# Patient Record
Sex: Male | Born: 1980 | Race: White | Hispanic: No | Marital: Single | Smoking: Never smoker
Health system: Southern US, Community
[De-identification: ages and names within clinical notes are randomized; demographics above are authoritative.]

## PROBLEM LIST (undated history)

## (undated) DIAGNOSIS — R748 Abnormal levels of other serum enzymes: Secondary | ICD-10-CM

## (undated) DIAGNOSIS — B009 Herpesviral infection, unspecified: Secondary | ICD-10-CM

## (undated) DIAGNOSIS — F1010 Alcohol abuse, uncomplicated: Secondary | ICD-10-CM

## (undated) HISTORY — DX: Herpesviral infection, unspecified: B00.9

## (undated) HISTORY — PX: WISDOM TOOTH EXTRACTION: SHX21

## (undated) HISTORY — PX: OTHER SURGICAL HISTORY: SHX169

---

## 2007-10-29 ENCOUNTER — Inpatient Hospital Stay (HOSPITAL_COMMUNITY): Admission: EM | Admit: 2007-10-29 | Discharge: 2007-10-31 | Payer: Self-pay | Admitting: Emergency Medicine

## 2007-10-29 ENCOUNTER — Ambulatory Visit: Payer: Self-pay | Admitting: Cardiology

## 2007-10-31 ENCOUNTER — Ambulatory Visit: Payer: Self-pay | Admitting: Cardiology

## 2007-11-05 ENCOUNTER — Ambulatory Visit: Payer: Self-pay | Admitting: Cardiology

## 2011-03-11 NOTE — Assessment & Plan Note (Signed)
[facility]                            CARDIOLOGY OFFICE NOTE   AMAURY, KUZEL                    MRN:          [id number]  DATE:11/05/2007                            DOB:          [birth year]    PRIMARY CARE PHYSICIAN:  Molly Maduro P. Merla Riches, M.D.   REASON FOR THIS:  Post hospitalization follow up.   HISTORY OF PRESENT ILLNESS:  I saw Mr. Trevor [name] as an inpatient  cardiology consultation earlier this month.  He was admitted to the  hospitalist service at that time with chest pain following a cocaine and  alcohol binge.  Mr. Tooker, typically, has not used any drugs; but  states that back in college he used to use cocaine, and had gotten  around some friends that were visiting through town, resulting in the  recurrent event.   He ruled out for myocardial infarction during observation, and also had  a CT scan of his chest that showed no evidence of aortic dissection.  He  underwent an exercise Myoview which was reassuring from both an  electrocardiographic perspective, and also a perfusion perspective with  no evidence of scar or ischemia, and a normal ejection fraction of 55%.  I spoke with him about cessation of cocaine use, and we also talked  about tobacco cessation.  He reports that he had no problems since  discharge, and his electrocardiogram today shows sinus rhythm with  increased voltage, and some changes consistent with early  repolarization.   ALLERGIES:  No known drug allergies.   CHRONIC MEDICATIONS:  None.   REVIEW OF SYSTEMS:  As described in history of present illness.  Otherwise negative.   PHYSICAL EXAMINATION:  Examination blood pressure today was 143/72.  Heart rate initially 102, down to the 80s.  Weight is 194 pounds.  The patient is comfortable and in no acute distress.  NECK:  Shows no elevated venous pressure without bruits.  LUNGS:  Clear.  CARDIAC EXAM:  Reveals a regular rate and rhythm.  No rub or gallop.  EXTREMITIES:  Show no pitting edema.   IMPRESSION/RECOMMENDATIONS:  1. History of chest pain following cocaine and alcohol ingestion.  The      patient ruled out for myocardial infarction, and had a CT scan of      the chest demonstrating no evidence of aortic dissection with a      follow up exercise Myoview showing no evidence of scar or ischemia      with an ejection fraction of 55%.  The essential point made, today,      was to abstain from any further use of cocaine; and for that matter      work on tobacco cessation as well.  At this point, he needs general      risk factor modification strategies.  I have recommended that he      continue      to follow up with Dr. Merla Riches in this regard.  2. Cardiology follow up can be as needed.     Jonelle Sidle, MD  Electronically Signed    SGM/MedQ  DD: 11/05/2007  DT: 11/05/2007  Job #: [id number]   cc:   Harrel Lemon. Merla Riches, M.D.

## 2011-03-11 NOTE — H&P (Signed)
NAME:  Trevor [name], OTTAWAY NO.:  [id number]   MEDICAL RECORD NO.:  [id number]          PATIENT TYPE:  INP   LOCATION:  0111                         FACILITY:  [facility]   PHYSICIAN:  Beckey Rutter, MD  DATE OF BIRTH:  [birth year]   DATE OF ADMISSION:  10/29/2007  DATE OF DISCHARGE:                              HISTORY & PHYSICAL   PRIMARY CARE PHYSICIAN:  [facility]/Cardiology.  This patient  is unassigned to [facility].   CHIEF COMPLAINT:  Chest pain.   HISTORY OF PRESENT ILLNESS:  This is a 30 year old male with history of  substance abuse who presented today with chest pain.  The pain started  this morning, 10 out of 10, epigastric to the left of his chest in  position.  The pain is associated with shortness of breath but not  associated with significant relieving factor.  The patient was doing  cocaine yesterday and he was also drinking.  The duration is less than 1  hour.  No significant radiation.   PAST MEDICAL HISTORY:  Significant for polysubstance abuse, ethanol  abuse and tobacco abuse.   FAMILY HISTORY:  Noncontributory.   SOCIAL HISTORY:  Cocaine abuse, smoker and occasional drinker.   MEDICATION ALLERGIES:  Not known to have medication allergy.   MEDICATIONS:  None.   REVIEW OF SYSTEMS:  The patient denied headache, fevers, chills.  The  rest of review of systems is unremarkable.   PHYSICAL EXAMINATION:  VITAL SIGNS:  Temperature is 97.5, last blood  pressure is 133/65, first blood pressure at 7:00 in the morning here in  the emergency room is 159/69.  Pulse now is 106.  When he presented at  7:00 in the morning was documented at 145.  HEENT:  Head atraumatic, normocephalic.  PERRL.  Mouth moist.  No ulcer.  NECK:  Supple.  No JVD.  HEART:  Precordium first and second heart sounds audible.  No added  sounds.  ABDOMEN:  Soft, nontender.  Bowel sounds present.  NEUROLOGICAL:  He is alert, oriented x3 moving all his extremities  spontaneously.  GU:  No CVA tenderness.  EXTREMITIES:  No lower extremities tenderness.   LABS AND X-RAY:  Lipid profile showing HDL 49, LDL 87, total cholesterol  158, total bilirubin is 0.5, alkaline phosphatase is 46, AST 24, ALT 24,  CK-MB is 1.6, troponin is less than 0.05, myoglobin is 76.0.  Drug  screen showing positive cocaine, sodium 141, potassium 3.6, chloride  106, bicarb 25, glucose 107, BUN 8, creatinine 0.89.  Alcohol level is  195.  Second cardiac enzymes in the emergency room here, troponin is  less than 0.05, CK-MB is 2.2, INR is 1.1, white blood count is 8.7,  hemoglobin is 15.8, hematocrit is 44.8, platelet count is 290.  EKG  showing normal sinus rhythm at ventricular rate 121.  No old EKG in the  [facility] to compare to.  Chest x-ray showing impression of no active  disease.  The heart size and the mediastinal contours are normal.  Both  lungs are clear.   ASSESSMENT AND PLAN:  1. This is a  30 year old with polysubstance abuse who presented today      with chest pain.  This chest pain could be secondary to the effect      of the cocaine and for that we will admit the patient to rule out      acute coronary syndromes.  The patient was seen in consultation by      [facility] and they will follow up through with [place] for      further risk stratification.  The patient will be admitted to      stepdown floor for further assessment and management.  2. We will continue the patient on aspirin enteric coated.  I will      hold on giving beta-blocker at this time because of the history of      the cocaine.  3. The patient will be ruled out by serial cardiac enzymes and serial      EKGs.  4. The patient was counseled in regard to his smoking habits as well      as his cocaine use.  Further smoking cessation and polysubstance      abuse will be conducted during this hospitalization.  5. Cardiology consultation was taking place and further risk      stratification might  be needed as per cardiology discussion.  6. For deep venous thrombosis prophylaxis I will start the patient on      Lovenox.  7. For GI prophylaxis I will consider Protonix.      Beckey Rutter, MD  Electronically Signed     EME/MEDQ  D:  10/29/2007  T:  10/29/2007  Job:  [id number]

## 2011-03-11 NOTE — Consult Note (Signed)
NAME:  TOMER, CHALMERS NO.:  [id number]   MEDICAL RECORD NO.:  [id number]          PATIENT TYPE:  EMS   LOCATION:  ED                           FACILITY:  [facility]   PHYSICIAN:  Jonelle Sidle, MD DATE OF BIRTH:  [birth year]   DATE OF CONSULTATION:  DATE OF DISCHARGE:                                 CONSULTATION   REASON FOR CONSULTATION:  Chest pain associated with recent cocaine use.   HISTORY OF PRESENT ILLNESS:  Mr. Elizondo is a 30 year old male with no  reported major medical conditions and on no regular medications at home.  He reports a use of cocaine beginning yesterday at approximately 10 p.m.  and used intermittently until 4 a.m. this morning.  He states that he  used at least 2 g.  He also had a history of binge drinking of alcohol  and was drinking alcohol as well.  He came into the emergency department  this morning complaining of severe chest pain at approximately 6:30.  He  was treated with aspirin, morphine, Ativan, and intravenous  nitroglycerine and had improvement in his pain, although persistent  symptoms prompting a consultation with our service.  On my interview  now, at 10:30 in the morning the patient reports that he feels much  better without any active symptoms.  He continues on intravenous  nitroglycerine.  His electrocardiogram shows no acute injury pattern and  his initial cardiac markers are normal.  Chest x-ray reports no acute  disease process.  He was hypertensive upon presentation.  At the present  time, blood pressure is well controlled with a systolic of 130.   MEDICATIONS:  None chronically.  The patient is on intravenous  nitroglycerine presently at 20 mcg per minute, enteric coated aspirin 81  mg by mouth daily.  He has been treated with morphine and Ativan as  well.   PAST MEDICAL HISTORY:  Is as outlined above.  Patient denies any history  of type 2 diabetes mellitus, hypertension, hyperlipidemia or  cardiovascular  disease.  He did have a previous surgery to repair a  fractured left arm.   SOCIAL HISTORY:  The patient lives in [place].  He is single.  He  sells Designer, fashion/clothing.  He has a tobacco use up to pack per day  typically on Friday and Saturday.  Also history of binge drinking of  alcohol, spending approximately 200 to 300 dollars over the weekend.  He  reports using cocaine approximately 5 years ago,but not until recently.  He also states that he exercises by running approximately 15 miles a  week, typically without any symptoms of chest pain or breathlessness.   FAMILY HISTORY:  Reviewed.  No history of premature cardiovascular  disease evident.   REVIEW OF SYSTEMS:  As described in history of present illness.  He  reports problems with shin splints related to his running, otherwise  negative.   PHYSICAL EXAMINATION:  VITAL SIGNS:  Heart rate is in the 80s in sinus  rhythm.  Systolic blood pressure of 130.  Patient is afebrile.  GENERAL:  He is normally a well-nourished young  male, no acute distress  stating that he is feels much better.  HEENT:  Conjunctivae is normal.  Pharynx is clear.  NECK:  Supple.  No elevated jugular venous pressure.  No loud bruits.  No thyromegaly or thyroid tenderness.  LUNGS:  Clear without labored breathing.  CARDIAC:  Regular rate and rhythm.  No S3 gallop or obvious pericardial  rub.  ABDOMEN:  Soft.  Nontender.  Normoactive bowel sounds.  EXTREMITIES:  Exhibit no clubbing, cyanosis or edema.  Distal pulses are  2+.  SKIN:  Warm.  No lesions.  NEUROPSYCHIATRIC:  The patient is alert and oriented x 3.  Mildly  anxious.   LABORATORY DATA:  WBC is 8.7, hemoglobin 15.8, hematocrit 44.8,  platelets 290, INR 1.1, sodium 141, potassium 3.6, BUN 8, creatinine  0.89, troponin I less than 0.05 with normal myoglobin, urine drug screen  positive for cocaine, alcohol level 195.   IMPRESSION:  1. Chest pain syndrome following cocaine use.   Electrocardiogram at      this point is nonspecific and the patient reports improvement in      symptoms following treatment with benzodiazepines and      nitroglycerine.  His initial cardiac markers are normal.  Typically      he has no exertional chest pain and no other major cardiac risk      factors.  He was hypertensive on presentation although his systolic      blood pressure now is well controlled.  2. History of alcohol binge drinking and tobacco use.   RECOMMENDATIONS:  Suggest admission to the hospital for observation.  The hospitalist service will be evaluating the patient.  Would cycle  cardiac markers and continue aspirin, benzodiazepines and nitroglycerin.  I would also check a CT scan of the chest to rule out aortic dissection  given patient's presentation, although at this point I do not have a  high suspicion that this is the case.  DT prophylaxis is also indicated.  If his cardiac markers and CT scan are reassuring and he remains  clinically stable we can consider an exercise Myoview as an inpatient.  Our service with follow with you.      Jonelle Sidle, MD  Electronically Signed     SGM/MEDQ  D:  10/29/2007  T:  10/29/2007  Job:  [id number]

## 2011-03-11 NOTE — Discharge Summary (Signed)
NAME:  Trevor [name], Trevor [name] NO.:  [id number]   MEDICAL RECORD NO.:  [id number]          PATIENT TYPE:  INP   LOCATION:  1438                         FACILITY:  [facility]   PHYSICIAN:  Beckey Rutter, MD  DATE OF BIRTH:  October 11, 1981   DATE OF ADMISSION:  10/29/2007  DATE OF DISCHARGE:  10/31/2007                               DISCHARGE SUMMARY   PRIMARY CARE PHYSICIAN:  Unassigned.   CHIEF COMPLAINT:  Chest pain.   HISTORY OF PRESENT ILLNESS:  A 30 year old, came with chest pain after  cocaine abuse.   HOSPITAL COURSE:  Problem 1.  During hospitalization the patient was seen and evaluated by  cardiology service with [facility].  The patient was started on  nitro drip and then was admitted to the ICU during the chest pain  period.  The patient's pain is relieved the second day and then he had a  stress test for stratification, which showed no evidence of ischemia  although the ejection fraction mentioned about 55%.  The the patient  remained pain-free and with also negative monitoring on the telemetry.  There is no significant change in the EKG.  The patient was stable for  discharge today, to follow up with [facility] within a week.   Problem 2.  COCAINE ABUSE:  The patient was counseled.   Problem 3.  TOBACCO ABUSE:  The patient was counseled and smoking  cessation material was offered.   HOSPITAL CONSULTATION:  Cardiology, [place] [facility], Jonelle Sidle, MD.   PROCEDURES:  1. Myoview stress test on October 30, 2006.  Impression:  Normal left      ventricular function with quantitative ejection fraction of 55%.      No evidence of inducible ischemia with treadmill exercise.  2. CT chest with contrast done on October 29, 2007.  Impression:  No      acute findings.  3. Chest x-ray done on October 29, 2007.  Impression:  No acute      disease.   DISCHARGE DIAGNOSES:  1. Chest pain, likely cocaine-induced.  2. Cocaine abuse.  3. Tobacco  abuse.   DISCHARGE MEDICATIONS:  None.   DISCHARGE/PLAN:  The patient was discharged to follow up with [facility]  [facility]iology as discussed with him.  The patient is aware and agreeable to  the discharge plan.      Beckey Rutter, MD  Electronically Signed     EME/MEDQ  D:  11/01/2007  T:  11/01/2007  Job:  [id number]

## 2011-07-17 LAB — DIFFERENTIAL
Basophils Absolute: 0
Basophils Relative: 0
Eosinophils Absolute: 0.1
Eosinophils Relative: 1
Lymphocytes Relative: 21
Lymphs Abs: 1.8
Neutrophils Relative %: 58
Neutrophils Relative %: 73

## 2011-07-17 LAB — HEPATIC FUNCTION PANEL
ALT: 24
AST: 24
Indirect Bilirubin: 0.4
Total Protein: 6.5

## 2011-07-17 LAB — CARDIAC PANEL(CRET KIN+CKTOT+MB+TROPI)
CK, MB: 1.8
Relative Index: 1.1
Relative Index: 1.1
Troponin I: 0.01

## 2011-07-17 LAB — POCT CARDIAC MARKERS
Operator id: 3067
Troponin i, poc: <0.05

## 2011-07-17 LAB — BASIC METABOLIC PANEL
BUN: 12
CO2: 27
Calcium: 8.9
Chloride: 106
Chloride: 107
Creatinine, Ser: 0.84
Creatinine, Ser: 0.89
Glucose, Bld: 93

## 2011-07-17 LAB — ETHANOL

## 2011-07-17 LAB — RAPID URINE DRUG SCREEN, HOSP PERFORMED
Cocaine: POSITIVE — AB
Opiates: NOT DETECTED

## 2011-07-17 LAB — LIPID PANEL
HDL: 49
Triglycerides: 112

## 2011-07-17 LAB — CBC
MCHC: 35.1
MCV: 93.5
Platelets: 239
Platelets: 290
RDW: 12.9
WBC: 6.9
WBC: 8.7

## 2011-07-17 LAB — PROTIME-INR
INR: 1.1
Prothrombin Time: 14.1

## 2011-07-17 LAB — PHOSPHORUS: Phosphorus: 3.8

## 2011-07-17 LAB — MAGNESIUM: Magnesium: 2.2

## 2011-07-17 LAB — CK TOTAL AND CKMB (NOT AT ARMC)
Relative Index: 1
Total CK: 230

## 2011-07-17 LAB — TROPONIN I: Troponin I: 0.03

## 2012-08-06 ENCOUNTER — Ambulatory Visit (INDEPENDENT_AMBULATORY_CARE_PROVIDER_SITE_OTHER): Payer: BC Managed Care – PPO | Admitting: Physician Assistant

## 2012-08-06 VITALS — BP 128/76 | HR 71 | Temp 98.0°F | Resp 16 | Ht 68.5 in | Wt 190.0 lb

## 2012-08-06 DIAGNOSIS — B009 Herpesviral infection, unspecified: Secondary | ICD-10-CM | POA: Insufficient documentation

## 2012-08-06 MED ORDER — ACYCLOVIR 400 MG PO TABS: 400.0000 mg | ORAL_TABLET | Freq: Three times a day (TID) | ORAL | Status: DC

## 2012-08-06 NOTE — Progress Notes (Signed)
  Subjective:    Patient ID: Trevor [name], male    DOB: 1981/02/12, 31 y.o.   MRN: [id number]  HPI This 31 y.o. 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  He has no current symptoms, but has run out of acyclovir he uses for episodic treatment.  Review of Systems As above.     Past Medical History  Diagnosis Date  . HSV-2 (herpes simplex virus 2) infection     Left upper chest wall; diagnosed by culture    Past Surgical History  Procedure Date  . Broken arm     Prior to Admission medications   Not on File    No Known Allergies  History   Social History  . Marital Status: Single    Spouse Name: n/a    Number of Children: 0  . Years of Education: 14   Occupational History  . Concrete product manufacturing    Social History Main Topics  . Smoking status: Never Smoker   . Smokeless tobacco: Former Neurosurgeon  . Alcohol Use: Yes     occasional  . Drug Use: No  . Sexually Active: Not Currently   Other Topics Concern  . Not on file   Social History Narrative  . No narrative on file    Family History  Problem Relation Age of Onset  . Melanoma Mother        Objective:   Physical Exam  Blood pressure 128/76, pulse 71, temperature 98 F (36.7 C), temperature source Oral, resp. rate 16, height 5' 8.5" (1.74 m), weight 190 lb (86.183 kg), SpO2 97.00%. Body mass index is 28.47 kg/(m^2). Well-developed, well nourished WM who is awake, alert and oriented, in NAD. HEENT: [place]/AT, sclera and conjunctiva are clear.   Neck: supple, non-tender, no lymphadenopathy, thyromegaly. Heart: RRR, no murmur Lungs: normal effort, CTA Skin: warm and dry without rash, though scarring at the site on the left upper chest is consistent with previous outbreaks. Psychologic: good mood and appropriate affect, normal speech and behavior.     Assessment & Plan:   1. HSV-2 (herpes simplex virus 2) infection  acyclovir (ZOVIRAX) 400 MG tablet

## 2013-03-31 ENCOUNTER — Telehealth: Payer: Self-pay

## 2013-03-31 DIAGNOSIS — B009 Herpesviral infection, unspecified: Secondary | ICD-10-CM

## 2013-03-31 MED ORDER — ACYCLOVIR 400 MG PO TABS: 400.0000 mg | ORAL_TABLET | Freq: Three times a day (TID) | ORAL | Status: DC

## 2013-03-31 NOTE — Telephone Encounter (Signed)
PATIENT STATES HE IS GOING OUT OF TOWN ON Friday AND NEEDS A REFILL ON THE MEDICINE THAT CHELLE PRESCRIBES FOR THE OUTBREAK ON HIS CHEST. HE DOES NOT HAVE ANY REFILLS LEFT. BEST PHONE [phone] (CELL)   PHARMACY CHOICE IS [facility] ON [address].    MBC

## 2013-03-31 NOTE — Telephone Encounter (Signed)
Pended please advise.  

## 2013-03-31 NOTE — Telephone Encounter (Signed)
Patient advised.

## 2013-03-31 NOTE — Telephone Encounter (Signed)
Done

## 2013-07-27 ENCOUNTER — Telehealth: Payer: Self-pay

## 2013-07-27 DIAGNOSIS — B009 Herpesviral infection, unspecified: Secondary | ICD-10-CM

## 2013-07-27 MED ORDER — ACYCLOVIR 400 MG PO TABS: 400.0000 mg | ORAL_TABLET | Freq: Three times a day (TID) | ORAL | Status: DC

## 2013-07-27 NOTE — Telephone Encounter (Signed)
It has been one year since last office visit, pended small supply until he can return to clinic,

## 2013-07-27 NOTE — Telephone Encounter (Signed)
Sent in

## 2013-07-27 NOTE — Telephone Encounter (Signed)
Pt needs a refill on his acyclvir. Uses [facility] on corner of [address]. Call at [phone].

## 2014-03-15 ENCOUNTER — Ambulatory Visit (INDEPENDENT_AMBULATORY_CARE_PROVIDER_SITE_OTHER): Payer: BC Managed Care – PPO | Admitting: Physician Assistant

## 2014-03-15 VITALS — BP 131/73 | HR 58 | Temp 98.1°F | Resp 16 | Ht 66.0 in | Wt 189.2 lb

## 2014-03-15 DIAGNOSIS — B009 Herpesviral infection, unspecified: Secondary | ICD-10-CM

## 2014-03-15 MED ORDER — ACYCLOVIR 400 MG PO TABS: 400.0000 mg | ORAL_TABLET | Freq: Three times a day (TID) | ORAL | Status: DC

## 2014-03-15 NOTE — Progress Notes (Signed)
   Subjective:    Patient ID: Trevor [name], male    DOB: [birth year], 33 y.o.   MRN: [id number]  HPI 33 year old male presents for refill on acyclovir that he takes prn HSV 2 outbreaks. Has hx of this on his chest (diagnosed by culture in 2011).  Has been tolerating medication without any issues or concerns.  He starts the medication when he feels like his chest is "itchy and tingling" and can sometimes suppress an outbreak. Has had 2-3 outbreaks since last OV in 2013.  He is asymptomatic today.  He is otherwise healthy with no other concerns today.      Review of Systems  Constitutional: Negative for fever and chills.  Skin: Negative for rash.  Neurological: Negative for numbness.       Objective:   Physical Exam  Constitutional: He is oriented to person, place, and time. He appears well-developed and well-nourished.  HENT:  Head: Normocephalic and atraumatic.  Right Ear: External ear normal.  Left Ear: External ear normal.  Eyes: Conjunctivae are normal.  Neck: Normal range of motion.  Cardiovascular: Normal rate.   Pulmonary/Chest: Effort normal.  Neurological: He is alert and oriented to person, place, and time.  Psychiatric: He has a normal mood and affect. His behavior is normal. Judgment and thought content normal.          Assessment & Plan:  HSV-2 (herpes simplex virus 2) infection - Plan: acyclovir (ZOVIRAX) 400 MG tablet  Continue acyclovir 400 mg tid x 5 days prn outbreak.  Patient tolerating current dose. No other concerns today. Follow up as needed.

## 2014-07-25 ENCOUNTER — Telehealth: Payer: Self-pay

## 2014-07-25 DIAGNOSIS — B009 Herpesviral infection, unspecified: Secondary | ICD-10-CM

## 2014-07-25 MED ORDER — ACYCLOVIR 400 MG PO TABS: 400.0000 mg | ORAL_TABLET | Freq: Three times a day (TID) | ORAL | Status: DC

## 2014-07-25 NOTE — Telephone Encounter (Signed)
Pt would like to have a refill on his Zovirax 400mg s, Please call [phone]      [facility] [address]

## 2014-08-18 ENCOUNTER — Ambulatory Visit (INDEPENDENT_AMBULATORY_CARE_PROVIDER_SITE_OTHER): Payer: BC Managed Care – PPO | Admitting: Family Medicine

## 2014-08-18 VITALS — BP 116/68 | HR 66 | Temp 98.2°F | Resp 17 | Ht 68.5 in | Wt 203.0 lb

## 2014-08-18 DIAGNOSIS — S338XXA Sprain of other parts of lumbar spine and pelvis, initial encounter: Secondary | ICD-10-CM

## 2014-08-18 NOTE — Progress Notes (Signed)
   Subjective:    Patient ID: Trevor [name], male    DOB: Nov 15, 1980, 33 y.o.   MRN: [id number]  HPI Patient was having a bowel movement last night when he felt a pop in his right groin. The pain is intermittent, felt sharp at first, now feels more like pressure, no bulging or swelling noticed. Pain 4-5/10. More of an "annoyance," Has been on his feet a lot this morning and has noticed pain. Put ice on it last night and took tylenol with good relief. Slept fine last night without pain.  He does not typically have problems with constipation, but has been travelling for work this week, staying in hotels and eating poorly.    Review of Systems No abdominal pain, no difficulty with urination, no testicular pain, no radiation into back or down leg. No numbness or tingling.     Objective:   Physical Exam  Vitals reviewed. Constitutional: He is oriented to person, place, and time. He appears well-developed and well-nourished.  HENT:  Head: Normocephalic and atraumatic.  Eyes: Conjunctivae are normal.  Neck: Normal range of motion. Neck supple.  Cardiovascular: Normal rate.   Pulmonary/Chest: Effort normal.  Abdominal: Hernia confirmed negative in the right inguinal area.  Genitourinary:     Musculoskeletal: Normal range of motion.  Lymphadenopathy:       Right: No inguinal adenopathy present.  Neurological: He is alert and oriented to person, place, and time.  Skin: Skin is warm and dry.  Psychiatric: He has a normal mood and affect. His behavior is normal. Judgment and thought content normal.      Assessment & Plan:  1. Sprain of groin, initial encounter -OTC analgesics, ice as needed -RTC if worsening pain or if swelling occurs   Emi Belfasteborah B. Ameenah Prosser, FNP-BC  [facility], [facility]  08/18/2014 11:05 AM

## 2014-11-03 ENCOUNTER — Ambulatory Visit: Payer: BC Managed Care – PPO | Admitting: Family Medicine

## 2014-11-07 ENCOUNTER — Encounter: Payer: Self-pay | Admitting: Family Medicine

## 2014-11-07 ENCOUNTER — Ambulatory Visit (INDEPENDENT_AMBULATORY_CARE_PROVIDER_SITE_OTHER): Payer: BLUE CROSS/BLUE SHIELD | Admitting: Family Medicine

## 2014-11-07 VITALS — BP 134/91 | HR 69 | Temp 98.4°F | Resp 16 | Ht 69.0 in | Wt 210.0 lb

## 2014-11-07 DIAGNOSIS — Z131 Encounter for screening for diabetes mellitus: Secondary | ICD-10-CM

## 2014-11-07 DIAGNOSIS — Z0000 Encounter for general adult medical examination without abnormal findings: Secondary | ICD-10-CM

## 2014-11-07 DIAGNOSIS — Z1389 Encounter for screening for other disorder: Secondary | ICD-10-CM

## 2014-11-07 DIAGNOSIS — Z13220 Encounter for screening for lipoid disorders: Secondary | ICD-10-CM

## 2014-11-07 DIAGNOSIS — B009 Herpesviral infection, unspecified: Secondary | ICD-10-CM

## 2014-11-07 DIAGNOSIS — Z23 Encounter for immunization: Secondary | ICD-10-CM

## 2014-11-07 DIAGNOSIS — Z130 Encounter for screening for diseases of the blood and blood-forming organs and certain disorders involving the immune mechanism: Secondary | ICD-10-CM

## 2014-11-07 LAB — CBC
HEMATOCRIT: 47.5 % (ref 39.0–52.0)
HEMOGLOBIN: 16.7 g/dL (ref 13.0–17.0)
MCH: 32.4 pg (ref 26.0–34.0)
MCHC: 35.2 g/dL (ref 30.0–36.0)
MCV: 92.2 fL (ref 78.0–100.0)
MPV: 9.6 fL (ref 8.6–12.4)
Platelets: 294 10*3/uL (ref 150–400)
RBC: 5.15 MIL/uL (ref 4.22–5.81)
RDW: 13.4 % (ref 11.5–15.5)
WBC: 4.6 10*3/uL (ref 4.0–10.5)

## 2014-11-07 LAB — LIPID PANEL
CHOLESTEROL: 209 mg/dL — AB (ref 0–200)
HDL: 54 mg/dL (ref >39)
LDL CALC: 132 mg/dL — AB (ref 0–99)
TRIGLYCERIDES: 114 mg/dL (ref <150)
Total CHOL/HDL Ratio: 3.9 Ratio
VLDL: 23 mg/dL (ref 0–40)

## 2014-11-07 LAB — COMPREHENSIVE METABOLIC PANEL
ALK PHOS: 45 U/L (ref 39–117)
ALT: 35 U/L (ref 0–53)
AST: 26 U/L (ref 0–37)
Albumin: 4.7 g/dL (ref 3.5–5.2)
BILIRUBIN TOTAL: 0.8 mg/dL (ref 0.2–1.2)
BUN: 15 mg/dL (ref 6–23)
CO2: 27 mEq/L (ref 19–32)
CREATININE: 0.84 mg/dL (ref 0.50–1.35)
Calcium: 9.7 mg/dL (ref 8.4–10.5)
Chloride: 102 mEq/L (ref 96–112)
GLUCOSE: 96 mg/dL (ref 70–99)
POTASSIUM: 4.4 meq/L (ref 3.5–5.3)
SODIUM: 136 meq/L (ref 135–145)
TOTAL PROTEIN: 7.5 g/dL (ref 6.0–8.3)

## 2014-11-07 LAB — HEMOGLOBIN A1C
Hgb A1c MFr Bld: 5.4 % (ref <5.7)
MEAN PLASMA GLUCOSE: 108 mg/dL (ref <117)

## 2014-11-07 MED ORDER — ACYCLOVIR 400 MG PO TABS: ORAL_TABLET | ORAL | Status: DC

## 2014-11-07 NOTE — Patient Instructions (Signed)

## 2014-11-07 NOTE — Progress Notes (Signed)
Subjective:    Patient ID: Trevor [name], male    DOB: [birth year], 34 y.o.   MRN: [id number]  HPI Patient presents today for CPE. He needs Tdap and flu. He and his wife are expecting their first child at the end of the month. He has noticed some weight gain. He attributes this to eating differently during his wife's pregnancy and not exercising as much.   Has regular dental care. No recent HSV outbreaks (has documented on upper chest). Would like to have a prescription for acyclovir for the future.  Has occasional flare of psoriasis. Uses coal tar ointment with good relief.   Has had a cold since last week. Getting better. Occasional nonproductive cough. Clear nasal drainage. No headache, no sinus pressure. Some ear pressure last week. No sore throat. No fever/chills.   Past Medical History  Diagnosis Date  . 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 Broken arm     Family History  Problem Relation Age of Onset  . Melanoma Mother   . Cancer Mother   . Cancer Maternal Grandmother   . Cancer Maternal Grandfather    History  Substance Use Topics  . Smoking status: Never Smoker   . Smokeless tobacco: Former Neurosurgeon  . Alcohol Use: Yes     Comment: occasional/2-3 per week    Review of Systems  Constitutional: Negative.   HENT: Positive for rhinorrhea, sinus pressure and sneezing.   Eyes: Negative.   Respiratory: Positive for cough.   Cardiovascular: Negative.   Gastrointestinal: Negative.   Endocrine: Negative.   Genitourinary: Negative.   Musculoskeletal: Negative.   Skin: Negative.   Allergic/Immunologic: Negative.   Neurological: Negative.   Hematological: Negative.   Psychiatric/Behavioral: Negative.       Objective:   Physical Exam  Constitutional: He is oriented to person, place, and time. He appears well-developed and well-nourished.  HENT:  Head:  Normocephalic and atraumatic.  Right Ear: External ear normal.  Left Ear: External ear normal.  Nose: Rhinorrhea present.  Mouth/Throat: Oropharynx is clear and moist. No oropharyngeal exudate.  Eyes: Conjunctivae are normal. Pupils are equal, round, and reactive to light. Right eye exhibits no discharge. Left eye exhibits no discharge. No scleral icterus.  Neck: Normal range of motion. Neck supple.  Cardiovascular: Normal rate, regular rhythm, normal heart sounds and intact distal pulses.   Pulmonary/Chest: Effort normal and breath sounds normal.  Abdominal: Soft. Bowel sounds are normal. He exhibits no distension and no mass. There is no tenderness. There is no rebound and no guarding. Hernia confirmed negative in the right inguinal area.  Genitourinary: Testes normal and penis normal. Circumcised. No penile erythema or penile tenderness. No discharge found.  Musculoskeletal: Normal range of motion. He exhibits no edema or tenderness.  Lymphadenopathy:    He has no cervical adenopathy.       Right: No inguinal adenopathy present.       Left: No inguinal adenopathy present.  Neurological: He is alert and oriented to person, place, and time. He has normal reflexes.  Skin: Skin is warm and dry.  Psychiatric: He has a normal mood and affect. His behavior is normal. Judgment and thought content normal.  Vitals reviewed. BP 134/91 mmHg  Pulse 69  Temp(Src) 98.4 F (36.9 C) (Oral)  Resp 16  Ht  (1.753 m)  Wt 210 lb (95.255 kg)  BMI 31.00 kg/m2  SpO2 97%    Assessment & Plan:  1. Annual physical exam -discussed weight loss goals, encouraged healthy choices, increased exercise. Provided information regarding the Mediterranean Diet.   2. Need for prophylactic vaccination and inoculation against influenza - Flu Vaccine QUAD 36+ mos IM  3. Need for prophylactic vaccination with combined diphtheria-tetanus-pertussis (DTP) vaccine - Tdap vaccine greater than or equal to 7yo IM  4.  Screening for deficiency anemia - CBC  5. Screening for nephropathy - Comprehensive metabolic panel  6. Screening for cholesterol level - Lipid panel  7. Screening for diabetes mellitus - Hemoglobin A1c  8. HSV-2 (herpes simplex virus 2) infection - acyclovir (ZOVIRAX) 400 MG tablet; Take 1 tablet three times a day for 5 days  Dispense: 30 tablet; Refill: 2  Emi Belfasteborah B. Gessner, FNP-BC  [facility] [place] [facility]  11/07/2014 9:52 AM

## 2015-02-09 ENCOUNTER — Ambulatory Visit: Payer: BLUE CROSS/BLUE SHIELD | Admitting: Family Medicine

## 2015-02-13 ENCOUNTER — Ambulatory Visit (INDEPENDENT_AMBULATORY_CARE_PROVIDER_SITE_OTHER): Payer: BLUE CROSS/BLUE SHIELD | Admitting: Family Medicine

## 2015-02-13 ENCOUNTER — Encounter: Payer: Self-pay | Admitting: Family Medicine

## 2015-02-13 VITALS — BP 119/70 | HR 85 | Temp 98.3°F | Resp 16 | Ht 69.5 in | Wt 203.0 lb

## 2015-02-13 DIAGNOSIS — F411 Generalized anxiety disorder: Secondary | ICD-10-CM

## 2015-02-13 MED ORDER — ESCITALOPRAM OXALATE 20 MG PO TABS: 20.0000 mg | ORAL_TABLET | Freq: Every day | ORAL | Status: DC

## 2015-02-13 NOTE — Patient Instructions (Signed)
Take LEXAPRO 20 mg 1/2 tablet every morning for the rest of April. If you feel that you are still having some mild symptoms, then increase dose to 1 whole tablet on Feb 25, 2015. If you feel that you are managing your responsibilities and are able to focus and remain calm, then continue with 1/2 tablet daily.   Generalized Anxiety Disorder Generalized anxiety disorder (GAD) is a mental disorder. It interferes with life functions, including relationships, work, and school. GAD is different from normal anxiety, which everyone experiences at some point in their lives in response to specific life events and activities. Normal anxiety actually helps us prepare for and get through these life events and activities. Normal anxiety goes away after the event or activity is over.  GAD causes anxiety that is not necessarily related to specific events or activities. It also causes excess anxiety in proportion to specific events or activities. The anxiety associated with GAD is also difficult to control. GAD can vary from mild to severe. People with severe GAD can have intense waves of anxiety with physical symptoms (panic attacks).  SYMPTOMS The anxiety and worry associated with GAD are difficult to control. This anxiety and worry are related to many life events and activities and also occur more days than not for 6 months or longer. People with GAD also have three or more of the following symptoms (one or more in children):  Restlessness.   Fatigue.  Difficulty concentrating.   Irritability.  Muscle tension.  Difficulty sleeping or unsatisfying sleep. DIAGNOSIS GAD is diagnosed through an assessment by your health care provider. Your health care provider will ask you questions aboutyour mood,physical symptoms, and events in your life. Your health care provider may ask you about your medical history and use of alcohol or drugs, including prescription medicines. Your health care provider may also do a  physical exam and blood tests. Certain medical conditions and the use of certain substances can cause symptoms similar to those associated with GAD. Your health care provider may refer you to a mental health specialist for further evaluation. TREATMENT The following therapies are usually used to treat GAD:   Medication. Antidepressant medication usually is prescribed for long-term daily control. Antianxiety medicines may be added in severe cases, especially when panic attacks occur.   Talk therapy (psychotherapy). Certain types of talk therapy can be helpful in treating GAD by providing support, education, and guidance. A form of talk therapy called cognitive behavioral therapy can teach you healthy ways to think about and react to daily life events and activities.  Stress managementtechniques. These include yoga, meditation, and exercise and can be very helpful when they are practiced regularly. A mental health specialist can help determine which treatment is best for you. Some people see improvement with one therapy. However, other people require a combination of therapies. Document Released: 02/07/2013 Document Revised: 02/27/2014 Document Reviewed: 02/07/2013 [facility] Patient Information 2015 [employer] [place], [place]. This information is not intended to replace advice given to you by your health care provider. Make sure you discuss any questions you have with your health care provider.

## 2015-02-14 NOTE — Progress Notes (Signed)
S:  This 34 y.o male is here for treatment of anxiety. He recalls taking Lexapro in his late teens and when he was in college. That medication worked well for him but he has not needed it for several years. Now, he is a new father (infant daughter is 373 months old) and work is demanding. He feels very anxious at times; he reports difficulty concentrating and getting thoughts organized. He is not sleeping well (due to infant feedings and anxiety). Pt admits taking a partial dose of o wife's Xanax, which helped. He denies illicit drug use, excessive caffeine, diaphoresis, abnormal weight loss, CP or tightness, palpitations, SOB, HA, dizziness, weakness or syncope.  Family hx is significant for anxiety disorder in both parents.  Patient Active Problem List   Diagnosis Date Noted  . HSV-2 (herpes simplex virus 2) infection     Prior to Admission medications   Medication Sig Start Date End Date Taking? Authorizing Provider  acyclovir (ZOVIRAX) 400 MG tablet Take 1 tablet three times a day for 5 days 11/07/14   Emi Belfasteborah B Gessner, FNP    SURG, SOC and FAM HX reviewed.  ROS: A per HPI.  O: Filed Vitals:   02/13/15 1505  BP: 119/70  Pulse: 85  Temp: 98.3 F (36.8 C)  Resp: 16   GEN: In NAD; WN,WD. HENT: [place]/AT; EOMI w/ clear conj/sclerae. Ext ears/EACs/TMs normal. Oroph benign. NECK: Supple w/o LAN or TMG. COR: RRR. Normal S1 and S2. No m/g/r. LUNGS: CTA. Normal resp rate and effort. SKIN; W&D. No pallor. NEURO: A&O x 3; CNs intact. Nonfocal.  A/P: GAD (generalized anxiety disorder)- RX: Lexapro 20 mg 1/2 tab every morning for rest of April; if symptoms seem to be diminished, then continue this dose. If not feeling significantly improved, then increase to 1 tab= 20 mg every morning.  Meds ordered this encounter  Medications  . escitalopram (LEXAPRO) 20 MG tablet    Sig: Take 1 tablet (20 mg total) by mouth daily.    Dispense:  30 tablet    Refill:  5

## 2015-05-15 ENCOUNTER — Ambulatory Visit: Payer: BLUE CROSS/BLUE SHIELD | Admitting: Family Medicine

## 2015-05-24 ENCOUNTER — Other Ambulatory Visit: Payer: Self-pay | Admitting: Family Medicine

## 2015-08-25 ENCOUNTER — Ambulatory Visit (INDEPENDENT_AMBULATORY_CARE_PROVIDER_SITE_OTHER): Payer: BLUE CROSS/BLUE SHIELD | Admitting: Family Medicine

## 2015-08-25 VITALS — BP 128/80 | HR 74 | Temp 98.6°F | Resp 18 | Ht 69.25 in | Wt 202.0 lb

## 2015-08-25 DIAGNOSIS — R1011 Right upper quadrant pain: Secondary | ICD-10-CM

## 2015-08-25 DIAGNOSIS — R0789 Other chest pain: Secondary | ICD-10-CM

## 2015-08-25 DIAGNOSIS — K219 Gastro-esophageal reflux disease without esophagitis: Secondary | ICD-10-CM

## 2015-08-25 LAB — COMPREHENSIVE METABOLIC PANEL
ALBUMIN: 4.6 g/dL (ref 3.6–5.1)
ALT: 25 U/L (ref 9–46)
AST: 18 U/L (ref 10–40)
Alkaline Phosphatase: 43 U/L (ref 40–115)
BILIRUBIN TOTAL: 0.5 mg/dL (ref 0.2–1.2)
BUN: 16 mg/dL (ref 7–25)
CO2: 24 mmol/L (ref 20–31)
CREATININE: 0.8 mg/dL (ref 0.60–1.35)
Calcium: 10 mg/dL (ref 8.6–10.3)
Chloride: 104 mmol/L (ref 98–110)
Glucose, Bld: 102 mg/dL — ABNORMAL HIGH (ref 65–99)
Potassium: 4.4 mmol/L (ref 3.5–5.3)
SODIUM: 139 mmol/L (ref 135–146)
TOTAL PROTEIN: 7.3 g/dL (ref 6.1–8.1)

## 2015-08-25 LAB — POC MICROSCOPIC URINALYSIS (UMFC): Mucus: ABSENT

## 2015-08-25 LAB — POCT CBC
Granulocyte percent: 59.6 %G (ref 37–80)
HCT, POC: 45.4 % (ref 43.5–53.7)
HEMOGLOBIN: 15.9 g/dL (ref 14.1–18.1)
LYMPH, POC: 2.1 (ref 0.6–3.4)
MCH, POC: 32 pg — AB (ref 27–31.2)
MCHC: 35 g/dL (ref 31.8–35.4)
MCV: 91.6 fL (ref 80–97)
MID (cbc): 0.4 (ref 0–0.9)
MPV: 6.8 fL (ref 0–99.8)
POC GRANULOCYTE: 3.6 (ref 2–6.9)
POC LYMPH PERCENT: 33.7 %L (ref 10–50)
POC MID %: 6.7 %M (ref 0–12)
Platelet Count, POC: 260 10*3/uL (ref 142–424)
RBC: 4.96 M/uL (ref 4.69–6.13)
RDW, POC: 13.6 %
WBC: 6.1 10*3/uL (ref 4.6–10.2)

## 2015-08-25 LAB — POCT URINALYSIS DIP (MANUAL ENTRY)
BILIRUBIN UA: NEGATIVE
Bilirubin, UA: NEGATIVE
Glucose, UA: NEGATIVE
Leukocytes, UA: NEGATIVE
Nitrite, UA: NEGATIVE
Protein Ur, POC: NEGATIVE
RBC UA: NEGATIVE
SPEC GRAV UA: 1.02
Urobilinogen, UA: 0.2
pH, UA: 8

## 2015-08-25 LAB — POCT SEDIMENTATION RATE: POCT SED RATE: 7 mm/h (ref 0–22)

## 2015-08-25 LAB — GAMMA GT: GGT: 41 U/L (ref 7–51)

## 2015-08-25 MED ORDER — OMEPRAZOLE 40 MG PO CPDR: 40.0000 mg | DELAYED_RELEASE_CAPSULE | Freq: Every day | ORAL | Status: DC

## 2015-08-25 MED ORDER — MELOXICAM 15 MG PO TABS: 15.0000 mg | ORAL_TABLET | Freq: Every day | ORAL | Status: DC

## 2015-08-25 MED ORDER — ACYCLOVIR 400 MG PO TABS: ORAL_TABLET | ORAL | Status: DC

## 2015-08-25 NOTE — Progress Notes (Addendum)
Subjective:    Patient ID: Trevor [name], male    DOB: 29-Jul-1981, 34 y.o.   MRN: [id number] This chart was scribed for Norberto Sorenson, MD by Jolene Provost, Medical Scribe. This patient was seen in Room 4 and the patient's care was started a 9:40 AM.  Chief Complaint  Patient presents with  . Abdominal Pain    Upper right side, x1 week.   . Medication Refill    acyclovir      HPI HPI Comments: Trevor [name] is a 34 y.o. male who presents to [facility] complaining of constant dull upper right sided abdominal pain for the last week, with occasional twinges of pain when he turns his torso certain ways. He also states he has had worse heartburn and indigestion over that time and has been taking Tums intermittently throughout the day. He also endorses associated dry cough over the same period. He denies fever, chills, nausea, vomiting, constipation or diarrhea. He states he has been drinking more and his diet has been less healthy for the last couple weeks, and wants to make sure that does not have anything to do with his sx. He has also been running more for the last couple of weeks. His urine has been clear, and his stools have been normal without discoloration. He states he as been drinking more, and eating less healthily because he has been out of town on work, and he was bored.  Routine labs were done in January, which were normal other that slightly high cholesterol.  Past Medical History  Diagnosis Date  . HSV-2 (herpes simplex virus 2) infection     Left upper chest wall; diagnosed by culture   No Known Allergies  Current Outpatient Prescriptions on File Prior to Visit  Medication Sig Dispense Refill  . acyclovir (ZOVIRAX) 400 MG tablet TAKE 1 TABLET BY MOUTH THREE TIMES DAILY FOR 5 DAYS 30 tablet 4  . escitalopram (LEXAPRO) 20 MG tablet Take 1 tablet (20 mg total) by mouth daily. 30 tablet 5   No current facility-administered medications on file prior to visit.   Depression  screen [facility] 2/9 08/25/2015 11/07/2014  Decreased Interest 0 0  Down, Depressed, Hopeless 0 0  PHQ - 2 Score 0 0      Review of Systems  Constitutional: Negative for fever, chills, activity change, appetite change, fatigue and unexpected weight change.  Respiratory: Positive for cough. Negative for chest tightness, shortness of breath and wheezing.   Cardiovascular: Positive for chest pain. Negative for palpitations and leg swelling.  Gastrointestinal: Positive for abdominal pain. Negative for nausea, vomiting, diarrhea, constipation and abdominal distention.  Musculoskeletal: Negative for joint swelling and gait problem.  Skin: Negative for color change.  Neurological: Negative for syncope and weakness.  Hematological: Does not bruise/bleed easily.  Psychiatric/Behavioral: Negative for dysphoric mood.       Objective:  BP 128/80 mmHg  Pulse 74  Temp(Src) 98.6 F (37 C) (Oral)  Resp 18  Ht 5' 9.25" (1.759 m)  Wt 202 lb (91.627 kg)  BMI 29.61 kg/m2  SpO2 98%  Physical Exam  Constitutional: He is oriented to person, place, and time. He appears well-developed and well-nourished. No distress.  HENT:  Head: Normocephalic and atraumatic.  Nose: Nose normal.  Mouth/Throat: Oropharynx is clear and moist. No oropharyngeal exudate.  Eyes: Pupils are equal, round, and reactive to light.  Neck: Neck supple.  Cardiovascular: Normal rate, regular rhythm and normal heart sounds.   No murmur heard. Pulmonary/Chest: Effort  normal and breath sounds normal. No respiratory distress. He has no wheezes.  Excellent air movement.   Abdominal: Soft. Bowel sounds are normal. He exhibits no distension. There is no tenderness.  No CVA tenderness. No hepatosplenomegaly. Negative murphy's. No rib instability or TTP along interior rib cage.   Musculoskeletal: Normal range of motion.  Neurological: He is alert and oriented to person, place, and time. Coordination normal.  Skin: Skin is warm and dry. He  is not diaphoretic.  Psychiatric: He has a normal mood and affect. His behavior is normal.  Nursing note and vitals reviewed.     Assessment & Plan:   1. Right-sided chest wall pain   2. Abdominal pain, right upper quadrant   3. Gastroesophageal reflux disease, esophagitis presence not specified   Pt reassured that his pain seems to be chest wall so start qam meloxicam x 1-2 wks and ice. Uncontrolled GERD sxs so start ppi. Pt has stopped alcohol use.  Orders Placed This Encounter  Procedures  . Comprehensive metabolic panel  . Gamma GT  . H. pylori breath test  . POCT urinalysis dipstick  . POCT Microscopic Urinalysis ([facility])  . POCT CBC  . POCT SEDIMENTATION RATE    Meds ordered this encounter  Medications  . acyclovir (ZOVIRAX) 400 MG tablet    Sig: TAKE 1 TABLET BY MOUTH THREE TIMES DAILY FOR 5 DAYS    Dispense:  30 tablet    Refill:  11  . omeprazole (PRILOSEC) 40 MG capsule    Sig: Take 1 capsule (40 mg total) by mouth daily. 30 minutes prior to breakfast    Dispense:  30 capsule    Refill:  1  . meloxicam (MOBIC) 15 MG tablet    Sig: Take 1 tablet (15 mg total) by mouth daily. Do not use with any other otc pain medication other than acetaminophen    Dispense:  30 tablet    Refill:  0    I personally performed the services described in this documentation, which was scribed in my presence. The recorded information has been reviewed and considered, and addended by me as needed.  Norberto Sorenson, MD MPH   By signing my name below, I, Javier Docker, attest that this documentation has been prepared under the direction and in the presence of Norberto Sorenson, MD. Electronically Signed: Javier Docker, ER Scribe. 08/25/2015. 9:32 AM.  Results for orders placed or performed in visit on 08/25/15  Comprehensive metabolic panel  Result Value Ref Range   Sodium 139 135 - 146 mmol/L   Potassium 4.4 3.5 - 5.3 mmol/L   Chloride 104 98 - 110 mmol/L   CO2 24 20 - 31 mmol/L   Glucose,  Bld 102 (H) 65 - 99 mg/dL   BUN 16 7 - 25 mg/dL   Creat 1.61 0.96 - 0.45 mg/dL   Total Bilirubin 0.5 0.2 - 1.2 mg/dL   Alkaline Phosphatase 43 40 - 115 U/L   AST 18 10 - 40 U/L   ALT 25 9 - 46 U/L   Total Protein 7.3 6.1 - 8.1 g/dL   Albumin 4.6 3.6 - 5.1 g/dL   Calcium 40.9 8.6 - 81.1 mg/dL  Gamma GT  Result Value Ref Range   GGT 41 7 - 51 U/L  POCT urinalysis dipstick  Result Value Ref Range   Color, UA yellow yellow   Clarity, UA clear clear   Glucose, UA negative negative   Bilirubin, UA negative negative   Ketones,  POC UA negative negative   Spec Grav, UA 1.020    Blood, UA negative negative   pH, UA 8.0    Protein Ur, POC negative negative   Urobilinogen, UA 0.2    Nitrite, UA Negative Negative   Leukocytes, UA Negative Negative  POCT Microscopic Urinalysis ([facility])  Result Value Ref Range   WBC,UR,HPF,POC None None WBC/hpf   RBC,UR,HPF,POC None None RBC/hpf   Bacteria None None, Too numerous to count   Mucus Absent Absent   Epithelial Cells, UR Per Microscopy None None, Too numerous to count cells/hpf  POCT CBC  Result Value Ref Range   WBC 6.1 4.6 - 10.2 K/uL   Lymph, poc 2.1 0.6 - 3.4   POC LYMPH PERCENT 33.7 10 - 50 %L   MID (cbc) 0.4 0 - 0.9   POC MID % 6.7 0 - 12 %M   POC Granulocyte 3.6 2 - 6.9   Granulocyte percent 59.6 37 - 80 %G   RBC 4.96 4.69 - 6.13 M/uL   Hemoglobin 15.9 14.1 - 18.1 g/dL   HCT, POC 21.345.4 08.643.5 - 53.7 %   MCV 91.6 80 - 97 fL   MCH, POC 32.0 (A) 27 - 31.2 pg   MCHC 35.0 31.8 - 35.4 g/dL   RDW, POC 57.813.6 %   Platelet Count, POC 260 142 - 424 K/uL   MPV 6.8 0 - 99.8 fL  POCT SEDIMENTATION RATE  Result Value Ref Range   POCT SED RATE 7 0 - 22 mm/hr

## 2015-08-25 NOTE — Patient Instructions (Signed)

## 2015-08-27 ENCOUNTER — Encounter: Payer: Self-pay | Admitting: Family Medicine

## 2015-08-27 LAB — H. PYLORI BREATH TEST: H. PYLORI BREATH TEST: NOT DETECTED

## 2015-09-20 ENCOUNTER — Other Ambulatory Visit: Payer: Self-pay | Admitting: Family Medicine

## 2015-10-09 ENCOUNTER — Other Ambulatory Visit: Payer: Self-pay | Admitting: Family Medicine

## 2015-10-10 ENCOUNTER — Encounter: Payer: Self-pay | Admitting: Family Medicine

## 2015-11-06 ENCOUNTER — Emergency Department (HOSPITAL_COMMUNITY): Payer: BLUE CROSS/BLUE SHIELD

## 2015-11-06 ENCOUNTER — Encounter (HOSPITAL_COMMUNITY): Payer: Self-pay | Admitting: Emergency Medicine

## 2015-11-06 ENCOUNTER — Emergency Department (HOSPITAL_COMMUNITY)
Admission: EM | Admit: 2015-11-06 | Discharge: 2015-11-06 | Disposition: A | Discharge status: Home / Self Care | Payer: BLUE CROSS/BLUE SHIELD | Attending: Emergency Medicine | Admitting: Emergency Medicine

## 2015-11-06 DIAGNOSIS — R079 Chest pain, unspecified: Secondary | ICD-10-CM | POA: Diagnosis present

## 2015-11-06 DIAGNOSIS — R200 Anesthesia of skin: Secondary | ICD-10-CM | POA: Insufficient documentation

## 2015-11-06 DIAGNOSIS — Z8619 Personal history of other infectious and parasitic diseases: Secondary | ICD-10-CM | POA: Diagnosis not present

## 2015-11-06 LAB — CBC
HCT: 43 % (ref 39.0–52.0)
HEMOGLOBIN: 15.5 g/dL (ref 13.0–17.0)
MCH: 32.3 pg (ref 26.0–34.0)
MCHC: 36 g/dL (ref 30.0–36.0)
MCV: 89.6 fL (ref 78.0–100.0)
Platelets: 302 10*3/uL (ref 150–400)
RBC: 4.8 MIL/uL (ref 4.22–5.81)
RDW: 12.1 % (ref 11.5–15.5)
WBC: 9.3 10*3/uL (ref 4.0–10.5)

## 2015-11-06 LAB — BASIC METABOLIC PANEL
ANION GAP: 12 (ref 5–15)
BUN: 11 mg/dL (ref 6–20)
CALCIUM: 9.4 mg/dL (ref 8.9–10.3)
CO2: 24 mmol/L (ref 22–32)
Chloride: 103 mmol/L (ref 101–111)
Creatinine, Ser: 0.9 mg/dL (ref 0.61–1.24)
GLUCOSE: 115 mg/dL — AB (ref 65–99)
Potassium: 3.5 mmol/L (ref 3.5–5.1)
Sodium: 139 mmol/L (ref 135–145)

## 2015-11-06 LAB — I-STAT TROPONIN, ED
TROPONIN I, POC: 0 ng/mL (ref 0.00–0.08)
Troponin i, poc: 0.01 ng/mL (ref 0.00–0.08)

## 2015-11-06 NOTE — ED Notes (Signed)
Pt returned from X-ray.  

## 2015-11-06 NOTE — ED Provider Notes (Signed)
CSN: [id number]     Arrival date & time 11/06/15  1228 History   First MD Initiated Contact with Patient 11/06/15 1259     Chief Complaint  Patient presents with  . Chest Pain     (Consider location/radiation/quality/duration/timing/severity/associated sxs/prior Treatment) HPI Trevor [name] is a 35 y.o. male presents to emergency department complaining of left arm tingling and chest pain. Patient states that around 7:00 this morning he started feeling tingling in the left arm. He denies any weakness or numbness. He states he had some pain in the arm all the way from the hand to the shoulder. He denies any injuries. He states "it freaked me out." He then states that he tried to rest and see if the pain subsides. Around noon today he developed some right-sided chest pain as well. At this point he decided to come to emergency department to get checked out. He denies any associated shortness of breath. He denies any dizziness. No nausea or vomiting. He denies any diaphoresis. He denies any exertional symptoms. He states he was not doing anything active when his symptoms began. He took 2 aspirins and a Xanax before coming because he was feeling anxious. He is otherwise healthy and does not have any medical problems. He is never a smoker. No history of cardiac issues. No history of hypertension or elevated cholesterol. No family history of cardiac problems. He denies any pleuritic component to his chest pain. He denies any pain or swelling of his legs. Denies any recent travel or surgeries.  Past Medical History  Diagnosis Date  . 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 Broken arm     Family History  Problem Relation Age of Onset  . Melanoma Mother   . Cancer Mother   . Cancer Maternal Grandmother   . Cancer Maternal Grandfather    Social History  Substance Use Topics  . Smoking status:  Never Smoker   . Smokeless tobacco: Former Neurosurgeon  . Alcohol Use: Yes     Comment: occasional/2-3 per week    Review of Systems  Constitutional: Negative for fever and chills.  Respiratory: Positive for chest tightness. Negative for cough and shortness of breath.   Cardiovascular: Positive for chest pain. Negative for palpitations and leg swelling.  Gastrointestinal: Negative for nausea, vomiting, abdominal pain, diarrhea and abdominal distention.  Genitourinary: Negative for dysuria, urgency, frequency and hematuria.  Musculoskeletal: Negative for myalgias, arthralgias, neck pain and neck stiffness.  Skin: Negative for rash.  Allergic/Immunologic: Negative for immunocompromised state.  Neurological: Positive for numbness. Negative for dizziness, weakness, light-headedness and headaches.  All other systems reviewed and are negative.     Allergies  Review of patient's allergies indicates no known allergies.  Home Medications   Prior to Admission medications   Medication Sig Start Date End Date Taking? Authorizing Provider  acyclovir (ZOVIRAX) 400 MG tablet TAKE 1 TABLET BY MOUTH THREE TIMES DAILY FOR 5 DAYS 08/25/15  Yes Sherren Mocha, MD  omeprazole (PRILOSEC) 40 MG capsule TAKE 1 CAPSULE BY MOUTH DAILY 30 MINUTES PRIOR TO BREAKFAST 10/12/15  Yes Sherren Mocha, MD   BP 143/84 mmHg  Pulse 85  Temp(Src) 98.4 F (36.9 C) (Oral)  Resp 20  Ht 5\' 9"  (1.753 m)  Wt 91.627 kg  BMI 29.82 kg/m2  SpO2 90% Physical Exam  Constitutional: He is oriented to person, place,  and time. He appears well-developed and well-nourished. No distress.  HENT:  Head: Normocephalic and atraumatic.  Eyes: Conjunctivae are normal.  Neck: Neck supple.  Cardiovascular: Normal rate, regular rhythm and normal heart sounds.   Pulmonary/Chest: Effort normal and breath sounds normal. No respiratory distress. He has no wheezes. He has no rales.  Abdominal: Soft. Bowel sounds are normal. He exhibits no distension.  There is no tenderness. There is no rebound.  Musculoskeletal: He exhibits no edema.  Left arm normal appearing. Distal radial pulse intact. Sensation intact in all dermatomes. Grip strength 5/5 and equal.   Neurological: He is alert and oriented to person, place, and time.  Skin: Skin is warm and dry.  Nursing note and vitals reviewed.   ED Course  Procedures (including critical care time) Labs Review Labs Reviewed  BASIC METABOLIC PANEL - Abnormal; Notable for the following:    Glucose, Bld 115 (*)    All other components within normal limits  CBC  I-STAT TROPOININ, ED  Rosezena SensorI-STAT TROPOININ, ED    Imaging Review Dg Chest 2 View  11/06/2015  CLINICAL DATA:  Sharp right-sided chest pain. EXAM: CHEST  2 VIEW COMPARISON:  10/29/2007 FINDINGS: Both lungs are clear. Negative for airspace disease or pulmonary edema. Cannot exclude a minimally displaced or nondisplaced fracture involving the right ninth rib. Negative for a pneumothorax. Heart and mediastinum are within normal limits. Trachea is midline. No pleural effusions. IMPRESSION: Question a right ninth rib fracture. Recommend clinical correlation in this area. Electronically Signed   By: Richarda OverlieAdam  Henn M.D.   On: 11/06/2015 13:19   I have personally reviewed and evaluated these images and lab results as part of my medical decision-making.   EKG Interpretation None      MDM   Final diagnoses:  Chest pain, unspecified chest pain type   Patient with left-sided arm tingling and chest pain onset a few hours later. Pain is on the right side of the chest. Patient appears to be very anxious. Symptoms are nonexertional. No other associated symptoms. Patient is otherwise healthy, 35 year old. He has no risk factors for coronary artery disease. His vital signs are normal. He is not hypoxic, is not tachycardic, is not tachypnec. No risk factors for a PE. No lower extremity swelling or pain. Patient monitored in the emergency department for 3 hours.  Repeat troponin obtained and is negative. Question whether his symptoms could be due to anxiety. Will have patient follow with primary care doctor closely. Return precautions discussed. Patient agrees to the plan, and voiced understanding of discharge instructions.  Filed Vitals:   11/06/15 1351 11/06/15 1400 11/06/15 1430 11/06/15 1530  BP: 143/84 139/85 141/83 138/85  Pulse: 85 82 87 78  Temp:      TempSrc:      Resp: 20 21 22 22   Height:      Weight:      SpO2: 90% 97% 96% 92%     Jaynie Crumbleatyana Adden Strout, PA-C 11/06/15 1627  Azalia BilisKevin Campos, MD 11/07/15 [id number]

## 2015-11-06 NOTE — Discharge Instructions (Signed)
Your lab work, chest xray, ECG do not show any abnormalities to explain your symptoms. Follow up with primary care doctor for recheck. Return if worsening symptoms.   Nonspecific Chest Pain  Chest pain can be caused by many different conditions. There is always a chance that your pain could be related to something serious, such as a heart attack or a blood clot in your lungs. Chest pain can also be caused by conditions that are not life-threatening. If you have chest pain, it is very important to follow up with your health care provider. CAUSES  Chest pain can be caused by:  Heartburn.  Pneumonia or bronchitis.  Anxiety or stress.  Inflammation around your heart (pericarditis) or lung (pleuritis or pleurisy).  A blood clot in your lung.  A collapsed lung (pneumothorax). It can develop suddenly on its own (spontaneous pneumothorax) or from trauma to the chest.  Shingles infection (varicella-zoster virus).  Heart attack.  Damage to the bones, muscles, and cartilage that make up your chest wall. This can include:  Bruised bones due to injury.  Strained muscles or cartilage due to frequent or repeated coughing or overwork.  Fracture to one or more ribs.  Sore cartilage due to inflammation (costochondritis). RISK FACTORS  Risk factors for chest pain may include:  Activities that increase your risk for trauma or injury to your chest.  Respiratory infections or conditions that cause frequent coughing.  Medical conditions or overeating that can cause heartburn.  Heart disease or family history of heart disease.  Conditions or health behaviors that increase your risk of developing a blood clot.  Having had chicken pox (varicella zoster). SIGNS AND SYMPTOMS Chest pain can feel like:  Burning or tingling on the surface of your chest or deep in your chest.  Crushing, pressure, aching, or squeezing pain.  Dull or sharp pain that is worse when you move, cough, or take a deep  breath.  Pain that is also felt in your back, neck, shoulder, or arm, or pain that spreads to any of these areas. Your chest pain may come and go, or it may stay constant. DIAGNOSIS Lab tests or other studies may be needed to find the cause of your pain. Your health care provider may have you take a test called an ambulatory ECG (electrocardiogram). An ECG records your heartbeat patterns at the time the test is performed. You may also have other tests, such as:  Transthoracic echocardiogram (TTE). During echocardiography, sound waves are used to create a picture of all of the heart structures and to look at how blood flows through your heart.  Transesophageal echocardiogram (TEE).This is a more advanced imaging test that obtains images from inside your body. It allows your health care provider to see your heart in finer detail.  Cardiac monitoring. This allows your health care provider to monitor your heart rate and rhythm in real time.  Holter monitor. This is a portable device that records your heartbeat and can help to diagnose abnormal heartbeats. It allows your health care provider to track your heart activity for several days, if needed.  Stress tests. These can be done through exercise or by taking medicine that makes your heart beat more quickly.  Blood tests.  Imaging tests. TREATMENT  Your treatment depends on what is causing your chest pain. Treatment may include:  Medicines. These may include:  Acid blockers for heartburn.  Anti-inflammatory medicine.  Pain medicine for inflammatory conditions.  Antibiotic medicine, if an infection is present.  Medicines  to dissolve blood clots.  Medicines to treat coronary artery disease.  Supportive care for conditions that do not require medicines. This may include:  Resting.  Applying heat or cold packs to injured areas.  Limiting activities until pain decreases. HOME CARE INSTRUCTIONS  If you were prescribed an  antibiotic medicine, finish it all even if you start to feel better.  Avoid any activities that bring on chest pain.  Do not use any tobacco products, including cigarettes, chewing tobacco, or electronic cigarettes. If you need help quitting, ask your health care provider.  Do not drink alcohol.  Take medicines only as directed by your health care provider.  Keep all follow-up visits as directed by your health care provider. This is important. This includes any further testing if your chest pain does not go away.  If heartburn is the cause for your chest pain, you may be told to keep your head raised (elevated) while sleeping. This reduces the chance that acid will go from your stomach into your esophagus.  Make lifestyle changes as directed by your health care provider. These may include:  Getting regular exercise. Ask your health care provider to suggest some activities that are safe for you.  Eating a heart-healthy diet. A registered dietitian can help you to learn healthy eating options.  Maintaining a healthy weight.  Managing diabetes, if necessary.  Reducing stress. SEEK MEDICAL CARE IF:  Your chest pain does not go away after treatment.  You have a rash with blisters on your chest.  You have a fever. SEEK IMMEDIATE MEDICAL CARE IF:   Your chest pain is worse.  You have an increasing cough, or you cough up blood.  You have severe abdominal pain.  You have severe weakness.  You faint.  You have chills.  You have sudden, unexplained chest discomfort.  You have sudden, unexplained discomfort in your arms, back, neck, or jaw.  You have shortness of breath at any time.  You suddenly start to sweat, or your skin gets clammy.  You feel nauseous or you vomit.  You suddenly feel light-headed or dizzy.  Your heart begins to beat quickly, or it feels like it is skipping beats. These symptoms may represent a serious problem that is an emergency. Do not wait to  see if the symptoms will go away. Get medical help right away. Call your local emergency services (911 in the U.S.). Do not drive yourself to the hospital.   This information is not intended to replace advice given to you by your health care provider. Make sure you discuss any questions you have with your health care provider.   Document Released: 07/23/2005 Document Revised: 11/03/2014 Document Reviewed: 05/19/2014 Elsevier Interactive Patient Education 2016 [employer].   Emergency Department Resource Guide 1) Find a Doctor and Pay Out of Pocket Although you won't have to find out who is covered by your insurance plan, it is a good idea to ask around and get recommendations. You will then need to call the office and see if the doctor you have chosen will accept you as a new patient and what types of options they offer for patients who are self-pay. Some doctors offer discounts or will set up payment plans for their patients who do not have insurance, but you will need to ask so you aren't surprised when you get to your appointment.  2) Contact Your Local Health Department Not all health departments have doctors that can see patients for sick visits, but many  do, so it is worth a call to see if yours does. If you don't know where your local health department is, you can check in your phone book. The CDC also has a tool to help you locate your state's health department, and many state websites also have listings of all of their local health departments.  3) Find a Walk-in Clinic If your illness is not likely to be very severe or complicated, you may want to try a walk in clinic. These are popping up all over the country in pharmacies, drugstores, and shopping centers. They're usually staffed by nurse practitioners or physician assistants that have been trained to treat common illnesses and complaints. They're usually fairly quick and inexpensive. However, if you have serious medical issues or  chronic medical problems, these are probably not your best option.  No Primary Care Doctor: - Call Health Connect at  [phone] - they can help you locate a primary care doctor that  accepts your insurance, provides certain services, etc. - Physician Referral Service- [phone]  Chronic Pain Problems: Organization         Address  Phone   Notes  [facility]  [phone] Patients need to be referred by their primary care doctor.   Medication Assistance: Organization         Address  Phone   Notes  [facility] Medication [facility] [address] [place] [place], [place] [address] [phone] --Must be a resident of [facility] -- Must have NO insurance coverage whatsoever (no Medicaid/ Medicare, etc.) -- The pt. MUST have a primary care doctor that directs their care regularly and follows them in the community   [facility]  [phone]   [employer]  [phone]    Agencies that provide inexpensive medical care: Organization         Address  Phone   Notes  [facility]  [phone]   [facility]    [phone]   [facility] [address] [place], [place] [address] [phone]   [facility] [address] [place] [place] [phone]   Planned Parenthood    [phone]   [facility]    [phone]   [facility]  [address], [place] Phone:  [phone], Fax:  [phone] Hours of Operation:  9 am - 6 pm, M-F.  Also accepts Medicaid/Medicare and self-pay.  [facility]  [address], [place] Phone: [phone], Fax: [phone]. Hours of Operation:  8:30 am - 5:30 pm, M-F.  Also accepts Medicaid and self-pay.  [facility] [place] [address], [place] Phone: [phone]   [facility] [address] Natasha Bence [place], [place]  [phone], Ext. [phone] Mondays & Thursdays: 7-9 AM.  First 15 patients are seen on a first come, first serve basis.    Medicaid-accepting [facility] Providers:  Organization         Address  Phone   Notes  [facility] [address], [place] [phone] Also accepts self-pay patients.  [facility] [address] Laurell Josephs [place], [place]  [phone]   [facility] [address], [place] [phone]   [facility] [address], [place] [phone]   Renaye Rakers  [address], [place]   [phone] Only accepts [place] patients after they have their name applied to their card.   Self-Pay (no insurance) in [facility]:  Organization         Address  Phone   Notes  Sickle Cell Patients, [facility] Medicine [address] [place], [place] [phone]   [facility] [address] [place], [place] [phone]   [facility] [place]  [address], [place] [phone]   [facility]/Dr. Osei-Bonsu  [address] [place] or [address], [place] [phone] Phone number for both [place] and [place] locations is the same.  [facility]gent Medical and [facility] [address] [place] [phone]   [facility] [address] [place] or [address] [phone]   [facility] [address], [place] [phone], phone; [phone], fax Sees patients 1st and 3rd Saturday of every month.  Must not qualify for public or private insurance (i.e. Medicaid, Medicare, [place] Health Choice, Veterans' Benefits)  Household income should be no more than 200% of the poverty level The clinic cannot treat you if you are pregnant or think you are pregnant  Sexually transmitted  diseases are not treated at the clinic.    Dental Care: Organization         Address  Phone  Notes  [facility] [address] [place], [place] [phone] Accepts children up to age 83 who are enrolled in [place] or [place] Health Choice; pregnant women with a Medicaid card; and children who have applied for Medicaid or [place] Health Choice, but were declined, whose parents can pay a reduced fee at time of service.  [facility]  [address], [place] [phone] Accepts children up to age 20 who are enrolled in [place] or [place] Health Choice; pregnant women with a Medicaid card; and children who have applied for Medicaid or [place] Health Choice, but were declined, whose parents can pay a reduced fee at time of service.  [facility]  [address] [place], [place] [phone] Patients are seen by appointment only. Walk-ins are not accepted. [facility] will see patients 43 years of age and older. Monday - Tuesday (8am-5pm) Most Wednesdays (8:30-5pm) $30 per visit, cash only  [facility] Adult Dental Access [facility]  [address], [facility] [phone] Patients are seen by appointment only. Walk-ins are not accepted. [facility] will see patients 82 years of age and older. One Wednesday Evening (Monthly: Volunteer Based).  $30 per visit, cash only  [employer]  [phone] for adults; Children under age 29, call Graduate Pediatric Dentistry at [phone]. Children aged 75-14, please call [phone] to request a pediatric application.  Dental services are provided in all areas of dental care including fillings, crowns and bridges, complete and partial dentures, implants, gum treatment, root canals, and extractions. Preventive care is also provided. Treatment is provided to both adults and children. Patients are selected via a  lottery and there is often a waiting list.   [facility] [address], [place]  [phone] [email or web address]   [facility] [address] [place], [place] [phone], Ext. [phone] Second  and Fourth Thursday of each month, opens at 6:30 AM; Clinic ends at 9 AM.  Patients are seen on a first-come first-served basis, and a limited number are seen during each clinic.   [facility]  [address] Ether Griffins [place], [place] [phone]   Eligibility Requirements You must have lived in [place], [place], or [place] counties for at least the last three months.   You cannot be eligible for state or federal sponsored [place], including [employer], [place], or [employer].   You generally cannot be eligible for healthcare insurance through your employer.    How to apply: Eligibility screenings are held every Tuesday and Wednesday afternoon from 1:00 pm until 4:00 pm. You do not need an appointment for the interview!  [facility] [address], [place], [place] [phone]   [facility] Health Department  [phone]   [facility] Health Department  [phone]   [facility] Health Department  [phone]    Behavioral Health Resources in the Community: Intensive Outpatient Programs Organization         Address  Phone  Notes  [facility] Services [address], [place], [place] [phone]   [facility] Outpatient [address], [place], [place] [phone]   [facility] [address] [place], [place]  [phone]   [facility] Mental Health [address]  [place], [place] [phone] or [phone]   Substance Abuse Resources Organization         Address  Phone  Notes  [facility] Drug Services  [phone]   [facility]  [phone]   [facility]  [phone]   Floydene Flock  [phone]   Residential &  Outpatient Substance Abuse Program  [phone]   Psychological Services Organization         Address  Phone  Notes  [facility] Behavioral Health  [phone]   [facility] Services  [phone]   [facility] Mental Health [address] [place] [phone] or [phone]    Mobile Crisis Teams Organization         Address  Phone  Notes  [facility], Mobile Crisis Care Unit  [phone]   [facility]  [address] [place], [place] [phone]   Doristine Locks [address] [place] [phone]    Self-Help/Support Groups Organization         Address  Phone             Notes  [facility] [place] - variety of support groups  336- I7437963 Call for more information  Narcotics Anonymous (NA), [facility] [address], [employer] [place]  2 meetings at this location   Statistician         Address  Phone  Notes  [facility] [address],    [place]  [phone]   [facility]  [address], [place] [address], [place], [place] [phone]   [facility] Treatment Facility [address] [place], [place] [phone] Admissions: 8am-3pm M-F  [facility] [address]    [place], [place] [phone]   [facility] [address] Starling Manns [place], [place] [phone]   [facility] [address],  [place], [place] [phone]   [facility] - Intensive Outpatient [address]urell Joseph[address], [place], [place] [phone]   [facility] ([facility] [phone]  [place], [place] [phone] or [phone]   [facility] ([facility]) [address] [place], [place] [phone] Accepts Medicaid  [facility] [address]  [place] [phone] Substance Abuse/Addiction Treatment   [facility] Behavioral Health Resources Organization          Address  Phone  Notes  [facility]  [phone]) [phone]   Angie FavaJulie Brannon, PhD [address] [place], [place]   [phone](336) [phone] or [phone]) [phone]   [facility]   [address] [place], [place] [phone](336) [phone]   [facility] [address], [place], [place] [phone](336) [phone] Insurance/Medicaid/sponsorship through [facility]  [facility] [address]                                    [place], [place] [phone](336) [phone] Therapy/tele-psych/case  [facility] [address]   [place], [place] [phone](336) [phone]    Dr. Lolly MustacheArfeen  [phone]   [facility]  [employer] [facility] 1) [address], [place] 2) [address], [place] 3)  [address], [place] [phone]) [phone]) [phone]  [phone](336) [phone]   [facility] Child Abuse Hotline [phone]) [phone] or [phone]) [phone] (After Hours)

## 2015-11-06 NOTE — ED Notes (Signed)
Pt transported to XRay 

## 2015-11-06 NOTE — ED Notes (Signed)
Pt states around 0700 he started having shrap right sided chest pain that went into his left arm. Pt also felt nauseous. Pt took 2 81 mg ASA and 1 xanax tablet because he felt like it may be anxiety.

## 2015-11-23 ENCOUNTER — Other Ambulatory Visit: Payer: Self-pay | Admitting: Family Medicine

## 2016-01-08 ENCOUNTER — Telehealth: Payer: Self-pay

## 2016-01-08 NOTE — Telephone Encounter (Signed)
Pre visit call made. Left message for return call to update chart.

## 2016-01-09 ENCOUNTER — Encounter: Payer: Self-pay | Admitting: Family Medicine

## 2016-01-09 ENCOUNTER — Ambulatory Visit (INDEPENDENT_AMBULATORY_CARE_PROVIDER_SITE_OTHER): Payer: BLUE CROSS/BLUE SHIELD | Admitting: Family Medicine

## 2016-01-09 VITALS — BP 120/80 | HR 80 | Temp 98.1°F | Resp 16 | Ht 69.0 in | Wt 217.5 lb

## 2016-01-09 DIAGNOSIS — E669 Obesity, unspecified: Secondary | ICD-10-CM | POA: Insufficient documentation

## 2016-01-09 DIAGNOSIS — F411 Generalized anxiety disorder: Secondary | ICD-10-CM | POA: Diagnosis not present

## 2016-01-09 LAB — CBC WITH DIFFERENTIAL/PLATELET
Basophils Absolute: 0 10*3/uL (ref 0.0–0.1)
Basophils Relative: 0.4 % (ref 0.0–3.0)
EOS PCT: 0.9 % (ref 0.0–5.0)
Eosinophils Absolute: 0.1 10*3/uL (ref 0.0–0.7)
HCT: 46.8 % (ref 39.0–52.0)
Hemoglobin: 16.1 g/dL (ref 13.0–17.0)
LYMPHS ABS: 2.5 10*3/uL (ref 0.7–4.0)
Lymphocytes Relative: 28.6 % (ref 12.0–46.0)
MCHC: 34.4 g/dL (ref 30.0–36.0)
MCV: 92.6 fl (ref 78.0–100.0)
MONOS PCT: 7.7 % (ref 3.0–12.0)
Monocytes Absolute: 0.7 10*3/uL (ref 0.1–1.0)
NEUTROS PCT: 62.4 % (ref 43.0–77.0)
Neutro Abs: 5.4 10*3/uL (ref 1.4–7.7)
PLATELETS: 318 10*3/uL (ref 150.0–400.0)
RBC: 5.05 Mil/uL (ref 4.22–5.81)
RDW: 13.4 % (ref 11.5–15.5)
WBC: 8.6 10*3/uL (ref 4.0–10.5)

## 2016-01-09 LAB — LIPID PANEL
CHOL/HDL RATIO: 4
Cholesterol: 225 mg/dL — ABNORMAL HIGH (ref 0–200)
HDL: 59.9 mg/dL (ref >39.00)
LDL CALC: 137 mg/dL — AB (ref 0–99)
NONHDL: 164.78
Triglycerides: 139 mg/dL (ref 0.0–149.0)
VLDL: 27.8 mg/dL (ref 0.0–40.0)

## 2016-01-09 LAB — BASIC METABOLIC PANEL
BUN: 19 mg/dL (ref 6–23)
CO2: 28 mEq/L (ref 19–32)
Calcium: 9.6 mg/dL (ref 8.4–10.5)
Chloride: 102 mEq/L (ref 96–112)
Creatinine, Ser: 0.92 mg/dL (ref 0.40–1.50)
GFR: 99.66 mL/min (ref >60.00)
Glucose, Bld: 92 mg/dL (ref 70–99)
POTASSIUM: 3.8 meq/L (ref 3.5–5.1)
SODIUM: 138 meq/L (ref 135–145)

## 2016-01-09 LAB — HEPATIC FUNCTION PANEL
ALBUMIN: 4.9 g/dL (ref 3.5–5.2)
ALK PHOS: 51 U/L (ref 39–117)
ALT: 28 U/L (ref 0–53)
AST: 24 U/L (ref 0–37)
BILIRUBIN DIRECT: 0.2 mg/dL (ref 0.0–0.3)
Total Bilirubin: 0.7 mg/dL (ref 0.2–1.2)
Total Protein: 7.9 g/dL (ref 6.0–8.3)

## 2016-01-09 LAB — TSH: TSH: 1.52 u[IU]/mL (ref 0.35–4.50)

## 2016-01-09 MED ORDER — CITALOPRAM HYDROBROMIDE 20 MG PO TABS
20.0000 mg | ORAL_TABLET | Freq: Every day | ORAL | Status: DC
Start: 2016-01-09 — End: 2016-01-23

## 2016-01-09 NOTE — Assessment & Plan Note (Signed)
New.  Pt has hx of this in college but has not required meds since.  Admits to self medicating w/ ETOH and feeling 'wound all the time'.  Based on this, will start low dose Celexa and monitor for improvement.  Stressed need to limit ETOH as this is likely worsening his anxiety.  Will follow closely.

## 2016-01-09 NOTE — Progress Notes (Signed)
Pre visit review using our clinic review tool, if applicable. No additional management support is needed unless otherwise documented below in the visit note. 

## 2016-01-09 NOTE — Assessment & Plan Note (Signed)
New.  Pt has gained 15 lbs since Jan- most likely due to his increased ETOH intake.  Stressed need for healthy diet and regular exercise.  Check labs to risk stratify.  Will follow closely.

## 2016-01-09 NOTE — Progress Notes (Signed)
   Subjective:    Patient ID: Trevor [name], male    DOB: 05-Feb-1981, 35 y.o.   MRN: [id number]  HPI New to establish.  [facility]  Obesity- pt's BMI is now 32.  Has gained 15 lbs since Jan.  Anxiety- hx of similar.  Was on Lexapro in college but stopped.  Pt reports increased work stress over the last 12-18 months.  Pt no longer has time to exercise which used to be his outlet and is now drinking more.  Pt is drinking 6-10 beers 2-3 nights/week.  Pt did not like the Lexapro- 'did not work all that well'.  Pt reports feeling 'wound pretty tight'- boss has also told him this.  Has 4414 month old at home.  Denies being irritable.  Pt has intermittent palpitations, rare SOB, some cloudy thinking during high stress moments.   Review of Systems For ROS see HPI     Objective:   Physical Exam  Constitutional: He is oriented to person, place, and time. He appears well-developed and well-nourished. No distress.  HENT:  Head: Normocephalic and atraumatic.  Eyes: Conjunctivae and EOM are normal. Pupils are equal, round, and reactive to light.  Neck: Normal range of motion. Neck supple. No thyromegaly present.  Cardiovascular: Normal rate, regular rhythm, normal heart sounds and intact distal pulses.   No murmur heard. Pulmonary/Chest: Effort normal and breath sounds normal. No respiratory distress.  Abdominal: Soft. Bowel sounds are normal. He exhibits no distension.  Musculoskeletal: He exhibits no edema.  Lymphadenopathy:    He has no cervical adenopathy.  Neurological: He is alert and oriented to person, place, and time. No cranial nerve deficit.  Skin: Skin is warm and dry.  Psychiatric: He has a normal mood and affect. His behavior is normal.  Vitals reviewed.         Assessment & Plan:

## 2016-01-09 NOTE — Patient Instructions (Signed)
Follow up in 4-6 weeks to recheck anxiety We'll notify you of your lab results and make any changes if needed Try and find a stress outlet- exercise is great but also music, art, TV, etc Try and limit the alcohol Start the Celexa once daily Call with any questions or concerns If you want to join us at the new [place] office, any scheduled appointments will automatically transfer and we will see you at [address]bigail Miyamoto, [place], [place] [id number] (OPENING 3/23) Welcome!  We're glad to have you!!!

## 2016-01-21 ENCOUNTER — Other Ambulatory Visit: Payer: Self-pay | Admitting: Physician Assistant

## 2016-01-23 ENCOUNTER — Encounter: Payer: Self-pay | Admitting: Family Medicine

## 2016-01-23 MED ORDER — TRAZODONE HCL 50 MG PO TABS: 25.0000 mg | ORAL_TABLET | Freq: Every evening | ORAL | Status: DC | PRN

## 2016-01-23 MED ORDER — FLUOXETINE HCL 20 MG PO TABS: 20.0000 mg | ORAL_TABLET | Freq: Every day | ORAL | Status: DC

## 2016-01-25 ENCOUNTER — Other Ambulatory Visit: Payer: Self-pay

## 2016-01-25 MED ORDER — OMEPRAZOLE 40 MG PO CPDR: DELAYED_RELEASE_CAPSULE | ORAL | Status: DC

## 2016-01-29 ENCOUNTER — Encounter: Payer: Self-pay | Admitting: Family Medicine

## 2016-01-30 MED ORDER — BUPROPION HCL ER (XL) 150 MG PO TB24: 150.0000 mg | ORAL_TABLET | Freq: Every day | ORAL | Status: DC

## 2016-02-12 ENCOUNTER — Ambulatory Visit: Payer: BLUE CROSS/BLUE SHIELD | Admitting: Family Medicine

## 2016-02-18 ENCOUNTER — Ambulatory Visit (INDEPENDENT_AMBULATORY_CARE_PROVIDER_SITE_OTHER): Payer: BLUE CROSS/BLUE SHIELD | Admitting: Family Medicine

## 2016-02-18 ENCOUNTER — Encounter: Payer: Self-pay | Admitting: Family Medicine

## 2016-02-18 VITALS — BP 129/84 | HR 76 | Temp 98.0°F | Resp 16 | Ht 69.0 in | Wt 207.0 lb

## 2016-02-18 DIAGNOSIS — F411 Generalized anxiety disorder: Secondary | ICD-10-CM | POA: Diagnosis not present

## 2016-02-18 NOTE — Patient Instructions (Signed)
Schedule your complete physical in 6 months No med changes at this time Keep up the good work on healthy diet and regular exercise- you're doing great! Call with any questions or concerns Happy Spring!!!

## 2016-02-18 NOTE — Progress Notes (Signed)
Pre visit review using our clinic review tool, if applicable. No additional management support is needed unless otherwise documented below in the visit note. 

## 2016-02-18 NOTE — Assessment & Plan Note (Signed)
Improved.  Pt is doing much better since starting Wellbutrin.  Now exercising regularly, has decreased his alcohol intake.  No sexual side effects.  No med changes at this time.  Will continue to follow.

## 2016-02-18 NOTE — Progress Notes (Signed)
   Subjective:    Patient ID: Trevor BoydenWilliam R Whiteley, male    DOB: 21-Aug-1981, 35 y.o.   MRN: [id number]  HPI Anxiety- pt reports Celexa was helpful but had sexual side effects.  Switched to Prozac which wasn't the answer.  Now on Wellbutrin w/ improvement in anxiety, no sexual side effects, decreased desire to dip.  Has resumed exercising.  Pt feels that overall, things are improving.  Wife has noticed a difference.  Increased motivation, energy level is better, mood is better.  No palpitations, no insomnia.  Pt has lost 11 lbs since last visit.  Pt reports decreased ETOH intake w/ better stress management.   Review of Systems For ROS see HPI     Objective:   Physical Exam  Constitutional: He is oriented to person, place, and time. He appears well-developed and well-nourished. No distress.  HENT:  Head: Normocephalic and atraumatic.  Eyes: Conjunctivae and EOM are normal. Pupils are equal, round, and reactive to light.  Cardiovascular: Normal rate, regular rhythm, normal heart sounds and intact distal pulses.   Pulmonary/Chest: Effort normal and breath sounds normal. No respiratory distress. He has no wheezes.  Neurological: He is alert and oriented to person, place, and time.  Skin: Skin is warm and dry.  Psychiatric: He has a normal mood and affect. His behavior is normal. Thought content normal.  Vitals reviewed.         Assessment & Plan:

## 2016-04-22 ENCOUNTER — Other Ambulatory Visit: Payer: Self-pay | Admitting: Physician Assistant

## 2016-04-23 ENCOUNTER — Other Ambulatory Visit: Payer: Self-pay | Admitting: General Practice

## 2016-04-23 ENCOUNTER — Encounter: Payer: Self-pay | Admitting: Family Medicine

## 2016-04-23 MED ORDER — OMEPRAZOLE 40 MG PO CPDR: DELAYED_RELEASE_CAPSULE | ORAL | Status: DC

## 2016-04-23 MED ORDER — BUPROPION HCL ER (XL) 150 MG PO TB24: 150.0000 mg | ORAL_TABLET | Freq: Every day | ORAL | Status: DC

## 2016-05-07 ENCOUNTER — Encounter: Payer: Self-pay | Admitting: Family Medicine

## 2016-05-07 MED ORDER — CLONAZEPAM 0.5 MG PO TABS: ORAL_TABLET | ORAL | Status: DC

## 2016-05-08 ENCOUNTER — Encounter: Payer: Self-pay | Admitting: Family Medicine

## 2016-05-09 MED ORDER — ACYCLOVIR 400 MG PO TABS: ORAL_TABLET | ORAL | Status: DC

## 2016-05-09 NOTE — Telephone Encounter (Signed)
Medication filled to pharmacy as requested.   

## 2016-06-02 ENCOUNTER — Other Ambulatory Visit: Payer: Self-pay | Admitting: Family Medicine

## 2016-06-02 IMAGING — DX DG CHEST 2V
2 series · 2 of 2 positions shown · non-contrast
Comparison: 10/29/2007

CLINICAL DATA: Sharp right-sided chest pain.

EXAM:
CHEST  2 VIEW

[chest pa]
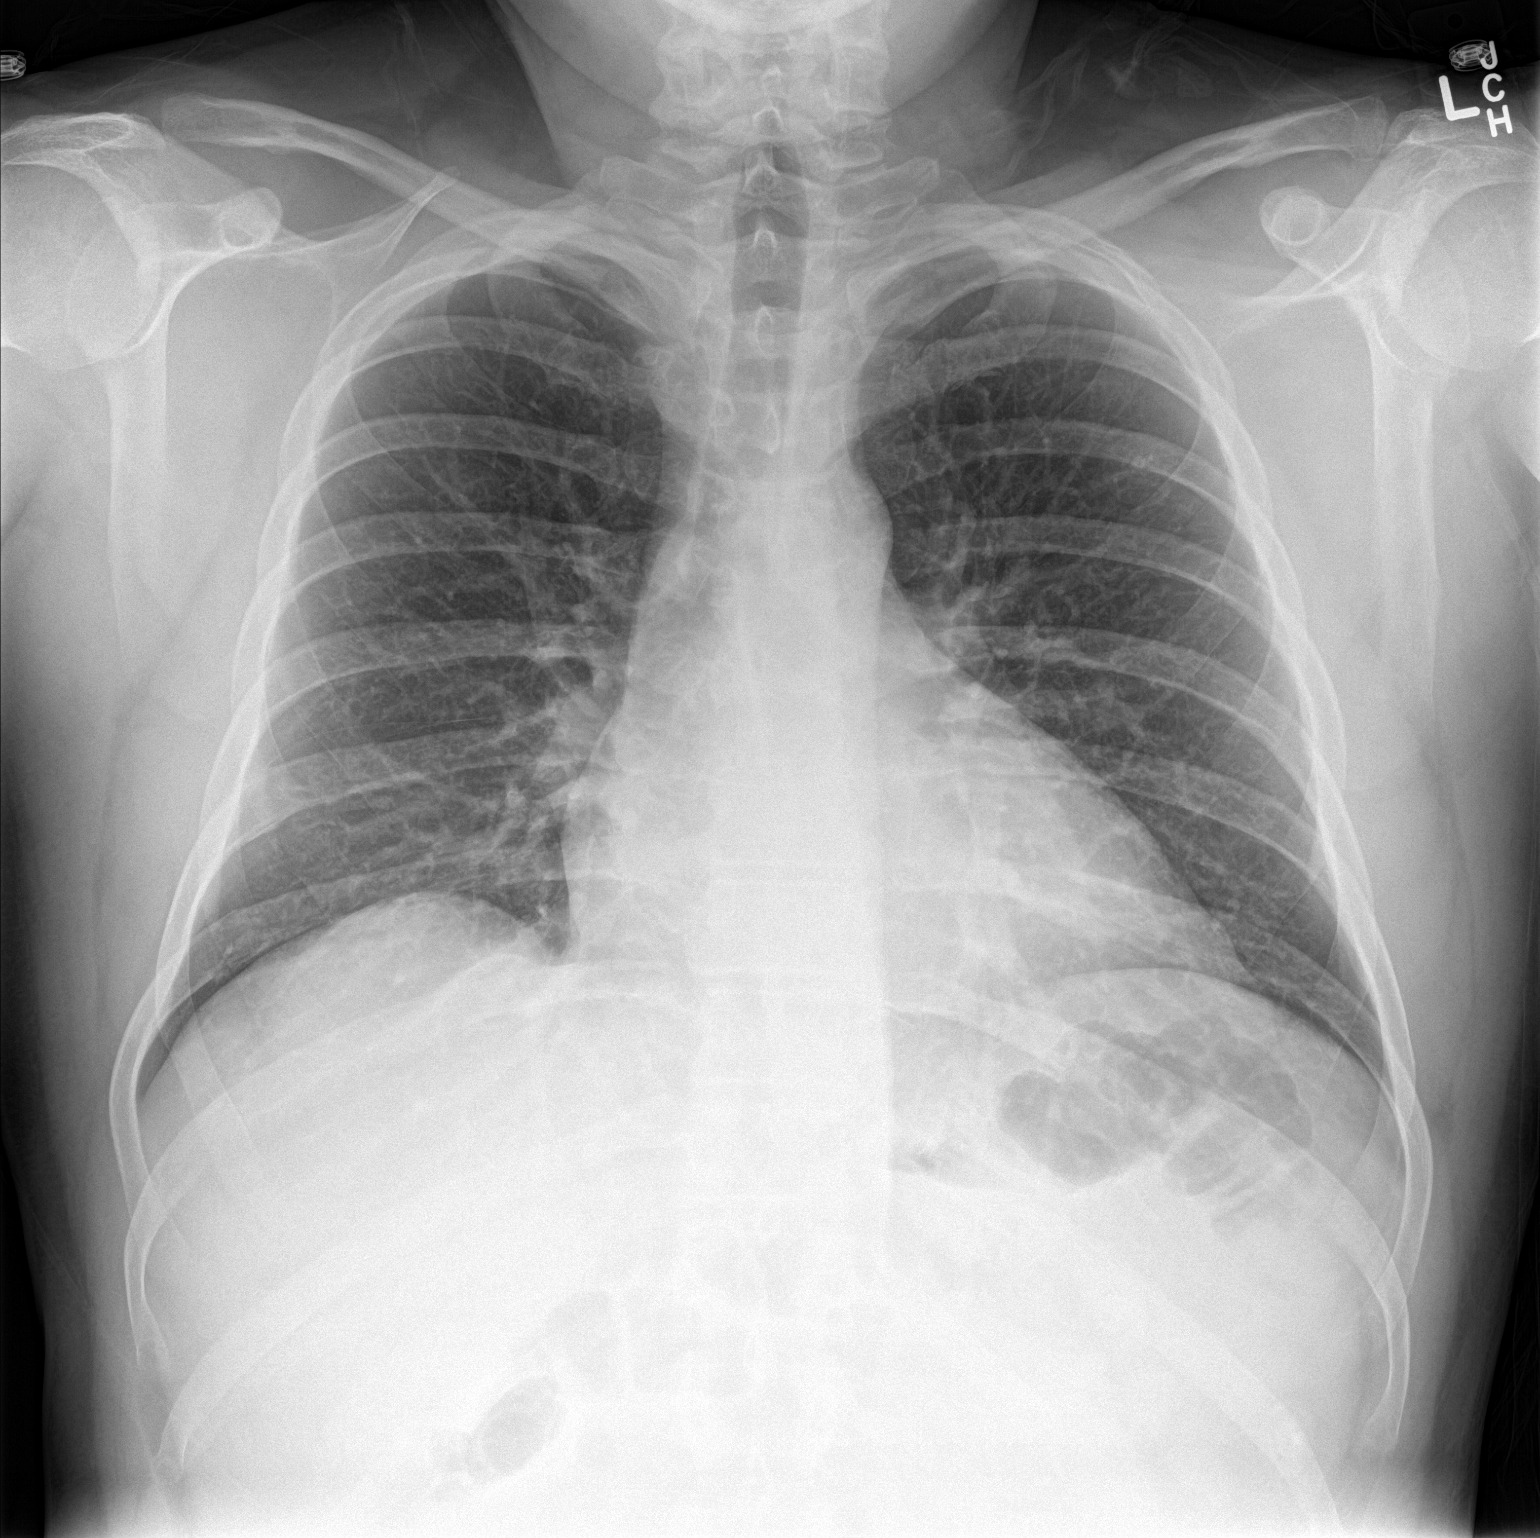

[chest lat]
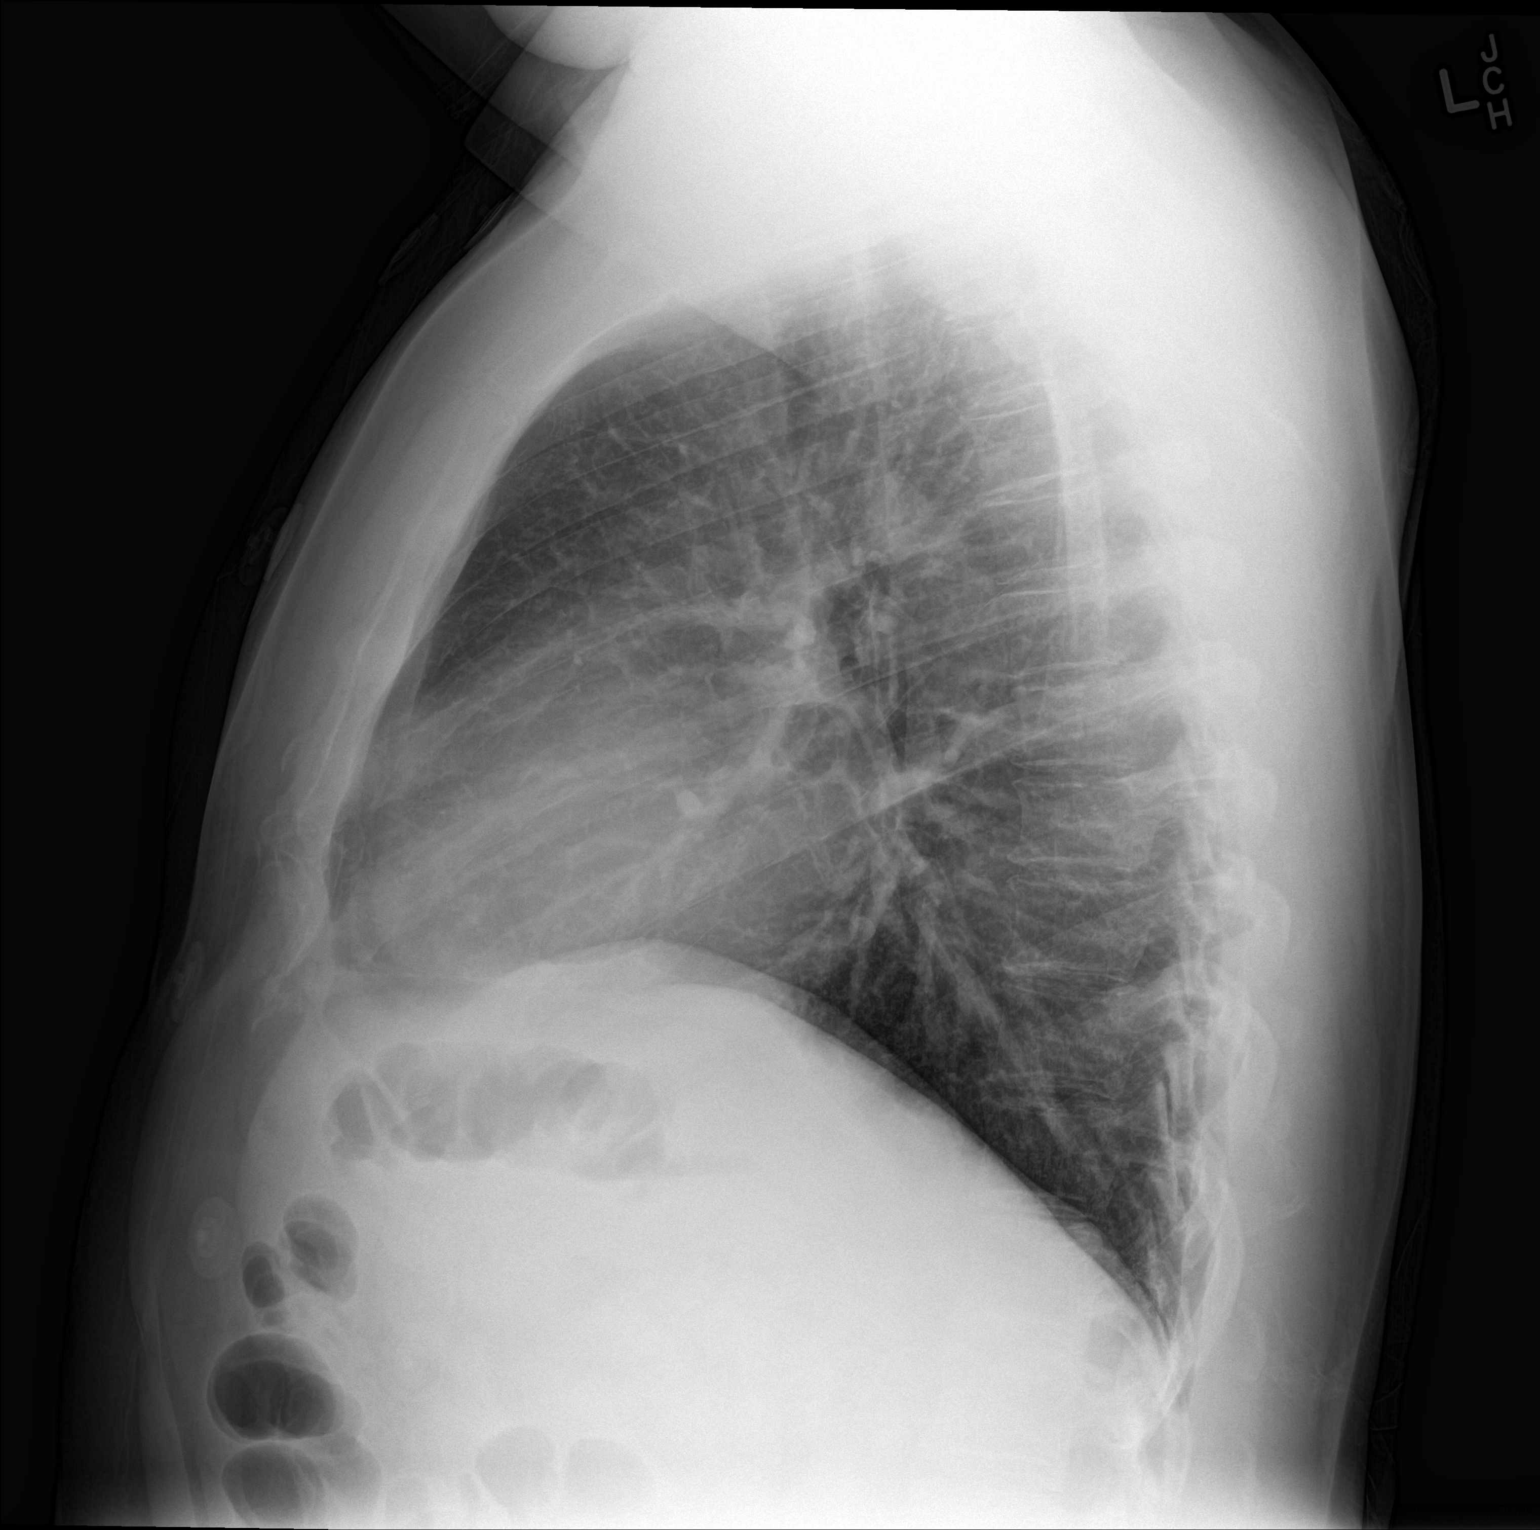

[2 of 2 positions shown; findings below may reference images not displayed]

FINDINGS: Both lungs are clear. Negative for airspace disease or pulmonary
edema. Cannot exclude a minimally displaced or nondisplaced fracture
involving the right ninth rib. Negative for a pneumothorax. Heart
and mediastinum are within normal limits. Trachea is midline. No
pleural effusions.
IMPRESSION: Question a right ninth rib fracture. Recommend clinical correlation
in this area.

## 2016-07-19 ENCOUNTER — Other Ambulatory Visit: Payer: Self-pay | Admitting: Family Medicine

## 2016-08-22 ENCOUNTER — Encounter: Payer: Self-pay | Admitting: Family Medicine

## 2016-08-22 ENCOUNTER — Ambulatory Visit (INDEPENDENT_AMBULATORY_CARE_PROVIDER_SITE_OTHER): Payer: BLUE CROSS/BLUE SHIELD | Admitting: Family Medicine

## 2016-08-22 VITALS — BP 121/80 | HR 76 | Temp 98.4°F | Resp 16 | Ht 69.0 in | Wt 220.5 lb

## 2016-08-22 DIAGNOSIS — Z0000 Encounter for general adult medical examination without abnormal findings: Secondary | ICD-10-CM

## 2016-08-22 DIAGNOSIS — Z23 Encounter for immunization: Secondary | ICD-10-CM

## 2016-08-22 LAB — CBC WITH DIFFERENTIAL/PLATELET
BASOS PCT: 0.3 % (ref 0.0–3.0)
Basophils Absolute: 0 10*3/uL (ref 0.0–0.1)
EOS PCT: 1 % (ref 0.0–5.0)
Eosinophils Absolute: 0.1 10*3/uL (ref 0.0–0.7)
HEMATOCRIT: 46.5 % (ref 39.0–52.0)
HEMOGLOBIN: 16.1 g/dL (ref 13.0–17.0)
LYMPHS PCT: 33.3 % (ref 12.0–46.0)
Lymphs Abs: 2.6 10*3/uL (ref 0.7–4.0)
MCHC: 34.7 g/dL (ref 30.0–36.0)
MCV: 90.9 fl (ref 78.0–100.0)
MONOS PCT: 8.3 % (ref 3.0–12.0)
Monocytes Absolute: 0.7 10*3/uL (ref 0.1–1.0)
NEUTROS ABS: 4.5 10*3/uL (ref 1.4–7.7)
Neutrophils Relative %: 57.1 % (ref 43.0–77.0)
PLATELETS: 306 10*3/uL (ref 150.0–400.0)
RBC: 5.11 Mil/uL (ref 4.22–5.81)
RDW: 13.6 % (ref 11.5–15.5)
WBC: 7.9 10*3/uL (ref 4.0–10.5)

## 2016-08-22 LAB — HEPATIC FUNCTION PANEL
ALT: 36 U/L (ref 0–53)
AST: 25 U/L (ref 0–37)
Albumin: 4.6 g/dL (ref 3.5–5.2)
Alkaline Phosphatase: 56 U/L (ref 39–117)
BILIRUBIN DIRECT: 0.2 mg/dL (ref 0.0–0.3)
BILIRUBIN TOTAL: 0.9 mg/dL (ref 0.2–1.2)
Total Protein: 7.3 g/dL (ref 6.0–8.3)

## 2016-08-22 LAB — BASIC METABOLIC PANEL
BUN: 15 mg/dL (ref 6–23)
CALCIUM: 9.6 mg/dL (ref 8.4–10.5)
CO2: 30 meq/L (ref 19–32)
Chloride: 100 mEq/L (ref 96–112)
Creatinine, Ser: 0.84 mg/dL (ref 0.40–1.50)
GFR: 110.3 mL/min (ref >60.00)
Glucose, Bld: 99 mg/dL (ref 70–99)
Potassium: 4 mEq/L (ref 3.5–5.1)
SODIUM: 138 meq/L (ref 135–145)

## 2016-08-22 LAB — LDL CHOLESTEROL, DIRECT: Direct LDL: 163 mg/dL

## 2016-08-22 LAB — LIPID PANEL
CHOL/HDL RATIO: 5
Cholesterol: 253 mg/dL — ABNORMAL HIGH (ref 0–200)
HDL: 55.8 mg/dL (ref >39.00)
NONHDL: 197.03
Triglycerides: 295 mg/dL — ABNORMAL HIGH (ref 0.0–149.0)
VLDL: 59 mg/dL — AB (ref 0.0–40.0)

## 2016-08-22 NOTE — Progress Notes (Signed)
   Subjective:    Patient ID: Trevor [name], male    DOB: 27-Sep-1981, 35 y.o.   MRN: [id number]  HPI CPE- UTD on Tdap.  No concerns today.  Pt has gained 15 lbs since last visit.  Flu shot given today   Review of Systems Patient reports no vision/hearing changes, anorexia, fever ,adenopathy, persistant/recurrent hoarseness, swallowing issues, chest pain, palpitations, edema, persistant/recurrent cough, hemoptysis, dyspnea (rest,exertional, paroxysmal nocturnal), gastrointestinal  bleeding (melena, rectal bleeding), abdominal pain, excessive heart burn, GU symptoms (dysuria, hematuria, voiding/incontinence issues) syncope, focal weakness, memory loss, numbness & tingling, skin/hair/nail changes, depression, anxiety, abnormal bruising/bleeding, musculoskeletal symptoms/signs.     Objective:   Physical Exam General Appearance:    Alert, cooperative, no distress, appears stated age  Head:    Normocephalic, without obvious abnormality, atraumatic  Eyes:    PERRL, conjunctiva/corneas clear, EOM's intact, fundi    benign, both eyes       Ears:    Normal TM's and external ear canals, both ears  Nose:   Nares normal, septum midline, mucosa normal, no drainage   or sinus tenderness  Throat:   Lips, mucosa, and tongue normal; teeth and gums normal  Neck:   Supple, symmetrical, trachea midline, no adenopathy;       thyroid:  No enlargement/tenderness/nodules  Back:     Symmetric, no curvature, ROM normal, no CVA tenderness  Lungs:     Clear to auscultation bilaterally, respirations unlabored  Chest wall:    No tenderness or deformity  Heart:    Regular rate and rhythm, S1 and S2 normal, no murmur, rub   or gallop  Abdomen:     Soft, non-tender, bowel sounds active all four quadrants,    no masses, no organomegaly  Genitalia:    Normal male without lesion, masses,discharge or tenderness  Rectal:    Deferred due to young age  Extremities:   Extremities normal, atraumatic, no cyanosis or edema    Pulses:   2+ and symmetric all extremities  Skin:   Skin color, texture, turgor normal, no rashes or lesions  Lymph nodes:   Cervical, supraclavicular, and axillary nodes normal  Neurologic:   CNII-XII intact. Normal strength, sensation and reflexes      throughout          Assessment & Plan:  Physical exam- pt's PE WNL w/ exception of being overweight.  UTD on Tdap.  Flu shot given today.  Check labs.  Anticipatory guidance provided.

## 2016-08-22 NOTE — Progress Notes (Signed)
Pre visit review using our clinic review tool, if applicable. No additional management support is needed unless otherwise documented below in the visit note. 

## 2016-08-22 NOTE — Patient Instructions (Signed)
Follow up in 1 year or as needed We'll notify you of your lab results and make any changes if needed Continue to work on healthy diet and regular exercise- you can do it! Call with any questions or concerns Happy Halloween!!!

## 2016-08-25 LAB — TSH: TSH: 1.33 u[IU]/mL (ref 0.35–4.50)

## 2016-08-27 ENCOUNTER — Other Ambulatory Visit: Payer: Self-pay | Admitting: General Practice

## 2016-08-27 DIAGNOSIS — E785 Hyperlipidemia, unspecified: Secondary | ICD-10-CM

## 2016-08-27 MED ORDER — ATORVASTATIN CALCIUM 20 MG PO TABS: 20.0000 mg | ORAL_TABLET | Freq: Every day | ORAL | 6 refills | Status: DC

## 2016-09-02 ENCOUNTER — Encounter: Payer: Self-pay | Admitting: Family Medicine

## 2016-09-03 MED ORDER — DIAZEPAM 5 MG PO TABS: 5.0000 mg | ORAL_TABLET | Freq: Two times a day (BID) | ORAL | 0 refills | Status: DC | PRN

## 2016-10-13 ENCOUNTER — Other Ambulatory Visit: Payer: Self-pay | Admitting: Family Medicine

## 2016-10-15 ENCOUNTER — Other Ambulatory Visit: Payer: Self-pay | Admitting: Family Medicine

## 2016-10-24 ENCOUNTER — Other Ambulatory Visit: Payer: Self-pay | Admitting: Family Medicine

## 2016-10-24 ENCOUNTER — Encounter: Payer: Self-pay | Admitting: Family Medicine

## 2016-10-24 ENCOUNTER — Ambulatory Visit (INDEPENDENT_AMBULATORY_CARE_PROVIDER_SITE_OTHER): Payer: BLUE CROSS/BLUE SHIELD | Admitting: Family Medicine

## 2016-10-24 VITALS — BP 132/83 | HR 81 | Temp 98.3°F | Resp 16 | Ht 69.0 in | Wt 221.2 lb

## 2016-10-24 DIAGNOSIS — J0100 Acute maxillary sinusitis, unspecified: Secondary | ICD-10-CM

## 2016-10-24 DIAGNOSIS — E785 Hyperlipidemia, unspecified: Secondary | ICD-10-CM

## 2016-10-24 LAB — HEPATIC FUNCTION PANEL
ALBUMIN: 5 g/dL (ref 3.5–5.2)
ALT: 57 U/L — AB (ref 0–53)
AST: 31 U/L (ref 0–37)
Alkaline Phosphatase: 67 U/L (ref 39–117)
Bilirubin, Direct: 0.1 mg/dL (ref 0.0–0.3)
Total Bilirubin: 0.5 mg/dL (ref 0.2–1.2)
Total Protein: 7.6 g/dL (ref 6.0–8.3)

## 2016-10-24 MED ORDER — AMOXICILLIN 875 MG PO TABS
875.0000 mg | ORAL_TABLET | Freq: Two times a day (BID) | ORAL | 0 refills | Status: DC
Start: 2016-10-24 — End: 2019-06-11

## 2016-10-24 NOTE — Addendum Note (Signed)
Addended by: Lenis DickinsonILLARD, BETHANY M on: 10/24/2016 12:07 PM   Modules accepted: Orders

## 2016-10-24 NOTE — Patient Instructions (Signed)
Follow up as needed Start the Amoxicillin twice daily- take w/ food Drink plenty of fluids REST! Mucinex DM for cough and congestion Call with any questions or concerns Happy New Year!!!

## 2016-10-24 NOTE — Progress Notes (Signed)
Pre visit review using our clinic review tool, if applicable. No additional management support is needed unless otherwise documented below in the visit note. 

## 2016-10-24 NOTE — Progress Notes (Signed)
   Subjective:    Patient ID: Trevor [name], male    DOB: 1981/08/21, 35 y.o.   MRN: [id number]  HPI URI- sxs started ~2 weeks ago w/ intermittent sore throat and cough.  A few nights ago developed L ear pain.  Now w/ nasal congestion, chest congestion, productive cough.  No fevers.  + tooth pain and sinus pressure.  + sick contacts.  No N/V.   Review of Systems For ROS see HPI     Objective:   Physical Exam  Constitutional: He is oriented to person, place, and time. He appears well-developed and well-nourished. No distress.  HENT:  Head: Normocephalic and atraumatic.  Right Ear: Tympanic membrane normal.  Left Ear: Tympanic membrane normal.  Nose: Mucosal edema and rhinorrhea present. Right sinus exhibits maxillary sinus tenderness and frontal sinus tenderness. Left sinus exhibits maxillary sinus tenderness and frontal sinus tenderness.  Mouth/Throat: Mucous membranes are normal. Oropharyngeal exudate and posterior oropharyngeal erythema present. No posterior oropharyngeal edema.  + PND  Eyes: Conjunctivae and EOM are normal. Pupils are equal, round, and reactive to light.  Neck: Normal range of motion. Neck supple.  Cardiovascular: Normal rate, regular rhythm and normal heart sounds.   Pulmonary/Chest: Effort normal and breath sounds normal. No respiratory distress. He has no wheezes.  + hacking cough  Lymphadenopathy:    He has no cervical adenopathy.  Neurological: He is alert and oriented to person, place, and time.  Skin: Skin is warm and dry.  Psychiatric: He has a normal mood and affect. His behavior is normal. Thought content normal.  Vitals reviewed.         Assessment & Plan:  Maxillary sinusitis- new.  Pt's sxs and PE consistent w/ infxn.  Start abx due to duration of illness and the fact it is worsening rather than improving.  Reviewed supportive care and red flags that should prompt return.  Pt expressed understanding and is in agreement w/ plan.

## 2017-01-10 ENCOUNTER — Encounter: Payer: Self-pay | Admitting: Family Medicine

## 2017-01-12 MED ORDER — ACYCLOVIR 400 MG PO TABS: ORAL_TABLET | ORAL | 0 refills | Status: DC

## 2017-02-02 ENCOUNTER — Encounter: Payer: Self-pay | Admitting: Family Medicine

## 2017-02-02 MED ORDER — ACYCLOVIR 400 MG PO TABS: ORAL_TABLET | ORAL | 0 refills | Status: DC

## 2017-03-05 ENCOUNTER — Encounter: Payer: Self-pay | Admitting: Family Medicine

## 2017-03-05 MED ORDER — BUPROPION HCL ER (XL) 150 MG PO TB24: 150.0000 mg | ORAL_TABLET | Freq: Every day | ORAL | 0 refills | Status: DC

## 2017-03-05 MED ORDER — ACYCLOVIR 400 MG PO TABS: ORAL_TABLET | ORAL | 0 refills | Status: DC

## 2017-03-05 MED ORDER — OMEPRAZOLE 40 MG PO CPDR: DELAYED_RELEASE_CAPSULE | ORAL | 1 refills | Status: DC

## 2017-03-27 ENCOUNTER — Encounter: Payer: Self-pay | Admitting: Family Medicine

## 2017-03-30 MED ORDER — ACYCLOVIR 400 MG PO TABS: ORAL_TABLET | ORAL | 1 refills | Status: DC

## 2017-04-11 ENCOUNTER — Encounter: Payer: Self-pay | Admitting: Family Medicine

## 2017-04-14 ENCOUNTER — Encounter: Payer: Self-pay | Admitting: Family Medicine

## 2017-04-14 MED ORDER — BUPROPION HCL ER (XL) 300 MG PO TB24: 300.0000 mg | ORAL_TABLET | Freq: Every day | ORAL | 3 refills | Status: DC

## 2017-04-24 ENCOUNTER — Encounter: Payer: Self-pay | Admitting: Family Medicine

## 2017-04-24 MED ORDER — ACYCLOVIR 400 MG PO TABS
ORAL_TABLET | ORAL | 1 refills | Status: DC
Start: 2017-04-24 — End: 2022-06-23

## 2017-06-02 ENCOUNTER — Other Ambulatory Visit: Payer: Self-pay | Admitting: Family Medicine

## 2017-09-04 ENCOUNTER — Encounter: Payer: BLUE CROSS/BLUE SHIELD | Admitting: Family Medicine

## 2018-03-17 ENCOUNTER — Encounter: Payer: Self-pay | Admitting: General Practice

## 2019-01-17 ENCOUNTER — Encounter: Payer: Self-pay | Admitting: Family

## 2019-01-17 ENCOUNTER — Telehealth: Payer: Self-pay | Admitting: Infectious Diseases

## 2019-06-03 ENCOUNTER — Other Ambulatory Visit: Payer: Self-pay

## 2019-06-03 ENCOUNTER — Encounter (HOSPITAL_COMMUNITY): Payer: Self-pay | Admitting: *Deleted

## 2019-06-03 ENCOUNTER — Inpatient Hospital Stay (HOSPITAL_COMMUNITY)
Admission: EM | Admit: 2019-06-03 | Discharge: 2019-06-05 | DRG: 894 | Discharge status: Against Medical Advice | Payer: Self-pay | Attending: Internal Medicine | Admitting: Internal Medicine

## 2019-06-03 DIAGNOSIS — F10239 Alcohol dependence with withdrawal, unspecified: Secondary | ICD-10-CM | POA: Diagnosis present

## 2019-06-03 DIAGNOSIS — Z5329 Procedure and treatment not carried out because of patient's decision for other reasons: Secondary | ICD-10-CM | POA: Diagnosis not present

## 2019-06-03 DIAGNOSIS — K7010 Alcoholic hepatitis without ascites: Secondary | ICD-10-CM

## 2019-06-03 DIAGNOSIS — F10939 Alcohol use, unspecified with withdrawal, unspecified: Secondary | ICD-10-CM | POA: Diagnosis present

## 2019-06-03 DIAGNOSIS — Z20828 Contact with and (suspected) exposure to other viral communicable diseases: Secondary | ICD-10-CM | POA: Diagnosis present

## 2019-06-03 DIAGNOSIS — Z87891 Personal history of nicotine dependence: Secondary | ICD-10-CM

## 2019-06-03 DIAGNOSIS — I10 Essential (primary) hypertension: Secondary | ICD-10-CM | POA: Diagnosis present

## 2019-06-03 DIAGNOSIS — F1020 Alcohol dependence, uncomplicated: Secondary | ICD-10-CM | POA: Diagnosis present

## 2019-06-03 DIAGNOSIS — Y900 Blood alcohol level of less than 20 mg/100 ml: Secondary | ICD-10-CM | POA: Diagnosis present

## 2019-06-03 DIAGNOSIS — Z79899 Other long term (current) drug therapy: Secondary | ICD-10-CM

## 2019-06-03 DIAGNOSIS — F10930 Alcohol use, unspecified with withdrawal, uncomplicated: Secondary | ICD-10-CM

## 2019-06-03 DIAGNOSIS — Z560 Unemployment, unspecified: Secondary | ICD-10-CM

## 2019-06-03 DIAGNOSIS — F10230 Alcohol dependence with withdrawal, uncomplicated: Principal | ICD-10-CM

## 2019-06-03 HISTORY — DX: Abnormal levels of other serum enzymes: R74.8

## 2019-06-03 HISTORY — DX: Alcohol abuse, uncomplicated: F10.10

## 2019-06-03 LAB — ETHANOL: Alcohol, Ethyl (B): 19 mg/dL — ABNORMAL HIGH (ref <10)

## 2019-06-03 LAB — CREATININE, SERUM
Creatinine, Ser: 0.69 mg/dL (ref 0.61–1.24)
GFR calc Af Amer: >60 mL/min (ref >60)
GFR calc non Af Amer: >60 mL/min (ref >60)

## 2019-06-03 LAB — COMPREHENSIVE METABOLIC PANEL
ALT: 194 U/L — ABNORMAL HIGH (ref 0–44)
AST: 122 U/L — ABNORMAL HIGH (ref 15–41)
Albumin: 4.3 g/dL (ref 3.5–5.0)
Alkaline Phosphatase: 57 U/L (ref 38–126)
Anion gap: 14 (ref 5–15)
BUN: 7 mg/dL (ref 6–20)
CO2: 22 mmol/L (ref 22–32)
Calcium: 9.3 mg/dL (ref 8.9–10.3)
Chloride: 103 mmol/L (ref 98–111)
Creatinine, Ser: 0.7 mg/dL (ref 0.61–1.24)
GFR calc Af Amer: >60 mL/min (ref >60)
GFR calc non Af Amer: >60 mL/min (ref >60)
Glucose, Bld: 123 mg/dL — ABNORMAL HIGH (ref 70–99)
Potassium: 4.1 mmol/L (ref 3.5–5.1)
Sodium: 139 mmol/L (ref 135–145)
Total Bilirubin: 0.9 mg/dL (ref 0.3–1.2)
Total Protein: 7.6 g/dL (ref 6.5–8.1)

## 2019-06-03 LAB — CBC
HCT: 48.9 % (ref 39.0–52.0)
HCT: 45.8 % (ref 39.0–52.0)
Hemoglobin: 17.1 g/dL — ABNORMAL HIGH (ref 13.0–17.0)
Hemoglobin: 16.5 g/dL (ref 13.0–17.0)
MCH: 34.7 pg — ABNORMAL HIGH (ref 26.0–34.0)
MCH: 35.2 pg — ABNORMAL HIGH (ref 26.0–34.0)
MCHC: 35 g/dL (ref 30.0–36.0)
MCHC: 36 g/dL (ref 30.0–36.0)
MCV: 99.2 fL (ref 80.0–100.0)
MCV: 97.7 fL (ref 80.0–100.0)
Platelets: 270 10*3/uL (ref 150–400)
Platelets: 254 10*3/uL (ref 150–400)
RBC: 4.93 MIL/uL (ref 4.22–5.81)
RBC: 4.69 MIL/uL (ref 4.22–5.81)
RDW: 13 % (ref 11.5–15.5)
RDW: 13.2 % (ref 11.5–15.5)
WBC: 5.6 10*3/uL (ref 4.0–10.5)
WBC: 7.1 10*3/uL (ref 4.0–10.5)
nRBC: 0 % (ref 0.0–0.2)
nRBC: 0 % (ref 0.0–0.2)

## 2019-06-03 LAB — RAPID URINE DRUG SCREEN, HOSP PERFORMED
Amphetamines: NOT DETECTED
Barbiturates: NOT DETECTED
Benzodiazepines: NOT DETECTED
Cocaine: NOT DETECTED
Opiates: NOT DETECTED
Tetrahydrocannabinol: NOT DETECTED

## 2019-06-03 LAB — SARS CORONAVIRUS 2 (TAT 6-24 HRS): SARS Coronavirus 2: NEGATIVE

## 2019-06-03 MED ORDER — LORAZEPAM 1 MG PO TABS: 0.0000 mg | ORAL_TABLET | Freq: Two times a day (BID) | ORAL | Status: DC

## 2019-06-03 MED ORDER — ONDANSETRON HCL 4 MG/2ML IJ SOLN: 4.0000 mg | Freq: Four times a day (QID) | INTRAMUSCULAR | Status: DC | PRN

## 2019-06-03 MED ORDER — SODIUM CHLORIDE 0.9 % IV SOLN
INTRAVENOUS | Status: DC
  Administered 2019-06-03: 15:00:00 via INTRAVENOUS
  Administered 2019-06-04: 17:00:00 1000 mL via INTRAVENOUS
  Administered 2019-06-05: 04:00:00 via INTRAVENOUS
  Administered 2019-06-05: 08:00:00 1000 mL via INTRAVENOUS

## 2019-06-03 MED ORDER — LORAZEPAM 2 MG/ML IJ SOLN
2.0000 mg | INTRAMUSCULAR | Status: DC | PRN
  Administered 2019-06-03 – 2019-06-04 (×3): 2 mg via INTRAVENOUS
  Administered 2019-06-04: 3 mg via INTRAVENOUS
  Administered 2019-06-04 – 2019-06-05 (×4): 2 mg via INTRAVENOUS
  Administered 2019-06-05 (×3): 3 mg via INTRAVENOUS
  Administered 2019-06-05 (×2): 2 mg via INTRAVENOUS
  Filled 2019-06-03 (×7): qty 1
  Filled 2019-06-03: qty 2
  Filled 2019-06-03: qty 1
  Filled 2019-06-03 (×2): qty 2
  Filled 2019-06-03: qty 1
  Filled 2019-06-03: qty 2

## 2019-06-03 MED ORDER — ENOXAPARIN SODIUM 40 MG/0.4ML ~~LOC~~ SOLN
40.0000 mg | SUBCUTANEOUS | Status: DC
  Administered 2019-06-03 – 2019-06-04 (×2): 40 mg via SUBCUTANEOUS
  Filled 2019-06-03 (×2): qty 0.4

## 2019-06-03 MED ORDER — LORAZEPAM 1 MG PO TABS: 0.0000 mg | ORAL_TABLET | Freq: Four times a day (QID) | ORAL | Status: DC

## 2019-06-03 MED ORDER — VITAMIN B-1 100 MG PO TABS
100.0000 mg | ORAL_TABLET | Freq: Every day | ORAL | Status: DC
  Administered 2019-06-04 – 2019-06-05 (×2): 100 mg via ORAL
  Filled 2019-06-03 (×2): qty 1

## 2019-06-03 MED ORDER — LORAZEPAM 2 MG/ML IJ SOLN: 0.0000 mg | Freq: Two times a day (BID) | INTRAMUSCULAR | Status: DC

## 2019-06-03 MED ORDER — THIAMINE HCL 100 MG/ML IJ SOLN
100.0000 mg | Freq: Every day | INTRAMUSCULAR | Status: DC
  Administered 2019-06-03: 11:00:00 100 mg via INTRAVENOUS
  Filled 2019-06-03: qty 2

## 2019-06-03 MED ORDER — THIAMINE HCL 100 MG/ML IJ SOLN
Freq: Once | INTRAVENOUS | Status: AC
  Administered 2019-06-03: 15:00:00 via INTRAVENOUS
  Filled 2019-06-03: qty 1000

## 2019-06-03 MED ORDER — ONDANSETRON HCL 4 MG PO TABS: 4.0000 mg | ORAL_TABLET | Freq: Four times a day (QID) | ORAL | Status: DC | PRN

## 2019-06-03 MED ORDER — LORAZEPAM 2 MG/ML IJ SOLN
1.0000 mg | Freq: Once | INTRAMUSCULAR | Status: AC
  Administered 2019-06-03: 15:00:00 1 mg via INTRAVENOUS
  Filled 2019-06-03: qty 1

## 2019-06-03 MED ORDER — LORAZEPAM 2 MG/ML IJ SOLN
0.0000 mg | Freq: Four times a day (QID) | INTRAMUSCULAR | Status: DC
  Administered 2019-06-03: 11:00:00 2 mg via INTRAVENOUS
  Filled 2019-06-03: qty 1

## 2019-06-03 MED ORDER — FOLIC ACID 1 MG PO TABS
1.0000 mg | ORAL_TABLET | Freq: Every day | ORAL | Status: DC
  Administered 2019-06-04 – 2019-06-05 (×2): 1 mg via ORAL
  Filled 2019-06-03 (×2): qty 1

## 2019-06-03 NOTE — H&P (Signed)
TRH H&P   Patient Demographics:    Trevor [name], is a 38 y.o. male  MRN: [id number]   DOB - [birth year]  Admit Date - 06/03/2019  Outpatient Primary MD for the patient is Midge Minium, MD  Referring MD/NP/PA: Dr Lita Mains  Patient coming from: Home  Chief Complaint  Patient presents with  . Alcohol Intoxication      HPI:    Trevor [name]  is a 38 y.o. male, with past medical history of alcohol abuse, he was at [facility] last January for 28 days, where he detoxed, he was laid off his job due to COVID-19 pandemic last March and since then he has been drinking heavily, fifth of vodka every night, ports weakness, nausea, vomiting over last 24 hours, as well he wanted to come to ED for detox, as he decided to stop drinking, he was accompanied by his father, last drink was 9 PM yesterday, in ED he was tremulous, denies any seizures, fever, chills, COVID-19 contact,.  Patient reports history of heavy alcohol abuse since age of 71. - in ED patient was noted to be tachycardic, with significant tremors, alcohol withdrawal, required multiple Ativan pushes in ED, elevated LFTs, with alcohol level of 19, and negative urine drug screen, I was consulted to admit    Review of systems:    In addition to the HPI above,  No Fever-chills, reports history of heavy alcohol abuse No Headache, No changes with Vision or hearing, No problems swallowing food or Liquids, No Chest pain, Cough or Shortness of Breath, No Abdominal pain, reports nausea and vomiting bowel movements are regular, No Blood in stool or Urine, No dysuria, No new skin rashes or bruises, No new joints pains-aches,  No new weakness, tingling, numbness in any extremity, No recent weight gain or loss, No polyuria, polydypsia or polyphagia, No significant Mental Stressors.  A full 10 point Review of Systems was  done, except as stated above, all other Review of Systems were negative.   With Past History of the following :    Past Medical History:  Diagnosis Date  . Elevated liver enzymes   . ETOH abuse   . 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 broken arm        Social History:     Social History   Tobacco Use  . Smoking status: Never Smoker  . Smokeless tobacco: Former Systems developer    Types: Snuff  Substance Use Topics  . Alcohol use: Yes    Comment: fifth liquor per day       Family History :     Family History  Problem Relation Age of Onset  . Melanoma Mother   . Cancer Mother        skin  .  Cancer Maternal Grandmother        lung  . Cancer Maternal Grandfather        lung     Home Medications:   Prior to Admission medications   Medication Sig Start Date End Date Taking? Authorizing Provider  acyclovir (ZOVIRAX) 400 MG tablet TAKE 1 TABLET BY MOUTH THREE TIMES DAILY FOR 5 DAYS 04/24/17  Yes Sheliah Hatchabori, Katherine E, MD  omeprazole (PRILOSEC) 40 MG capsule TAKE ONE CAPSULE BY MOUTH 30MIN PRIOR TO BREAKFAST 03/05/17  Yes Sheliah Hatchabori, Katherine E, MD  traZODone (DESYREL) 50 MG tablet TAKE 1/2 TO 1 TABLET BY MOUTH AT BEDTIME AS NEEDED FOR SLEEP 10/13/16  Yes Sheliah Hatchabori, Katherine E, MD  amoxicillin (AMOXIL) 875 MG tablet Take 1 tablet (875 mg total) by mouth 2 (two) times daily. Patient not taking: Reported on 06/03/2019 10/24/16   Sheliah Hatchabori, Katherine E, MD  atorvastatin (LIPITOR) 20 MG tablet Take 1 tablet (20 mg total) by mouth daily. Patient not taking: Reported on 06/03/2019 08/27/16   Sheliah Hatchabori, Katherine E, MD  buPROPion (WELLBUTRIN XL) 150 MG 24 hr tablet TAKE 1 TABLET BY MOUTH EVERY DAY Patient not taking: Reported on 06/03/2019 06/02/17   Sheliah Hatchabori, Katherine E, MD  clonazePAM Scarlette Calico(KLONOPIN) 0.5 MG tablet 1 tab prior to flight as needed Patient not taking: Reported on 06/03/2019 05/07/16    Sheliah Hatchabori, Katherine E, MD  diazepam (VALIUM) 5 MG tablet Take 1 tablet (5 mg total) by mouth every 12 (twelve) hours as needed for anxiety. Patient not taking: Reported on 06/03/2019 09/03/16   Sheliah Hatchabori, Katherine E, MD     Allergies:    No Known Allergies   Physical Exam:   Vitals  Blood pressure (!) 150/91, pulse (!) 120, temperature 98.3 F (36.8 C), temperature source Oral, resp. rate 13, height 5\' 9"  (1.753 m), weight 97.5 kg, SpO2 97 %.   1. General sick appearing male, laying in bed and discomfort  2. Normal affect and insight, Not Suicidal or Homicidal, Awake Alert, Oriented X 3.  3. No F.N deficits, ALL C.Nerves Intact, Strength 5/5 all 4 extremities, Sensation intact all 4 extremities, Plantars down going.  4. Ears and Eyes appear Normal, Conjunctivae clear, PERRLA. Moist Oral Mucosa.  5. Supple Neck, No JVD, No cervical lymphadenopathy appriciated, No Carotid Bruits.  6. Symmetrical Chest wall movement, Good air movement bilaterally, CTAB.  7.  Tachycardic, No Gallops, Rubs or Murmurs, No Parasternal Heave.  8. Positive Bowel Sounds, Abdomen Soft, No tenderness, No organomegaly appriciated,No rebound -guarding or rigidity.  9.  No Cyanosis, Normal Skin Turgor, No Skin Rash or Bruise.  10. Good muscle tone,  joints appear normal , no effusions, Normal ROM.  Significant tremors  11. No Palpable Lymph Nodes in Neck or Axillae     Data Review:    CBC Recent Labs  Lab 06/03/19 1044  WBC 5.6  HGB 17.1*  HCT 48.9  PLT 270  MCV 99.2  MCH 34.7*  MCHC 35.0  RDW 13.0   ------------------------------------------------------------------------------------------------------------------  Chemistries  Recent Labs  Lab 06/03/19 1044  NA 139  K 4.1  CL 103  CO2 22  GLUCOSE 123*  BUN 7  CREATININE 0.70  CALCIUM 9.3  AST 122*  ALT 194*  ALKPHOS 57  BILITOT 0.9    ------------------------------------------------------------------------------------------------------------------ estimated creatinine clearance is 144.1 mL/min (by C-G formula based on SCr of 0.7 mg/dL). ------------------------------------------------------------------------------------------------------------------ No results for input(s): TSH, T4TOTAL, T3FREE, THYROIDAB in the last 72 hours.  Invalid input(s): FREET3  Coagulation profile No results  for input(s): INR, PROTIME in the last 168 hours. ------------------------------------------------------------------------------------------------------------------- No results for input(s): DDIMER in the last 72 hours. -------------------------------------------------------------------------------------------------------------------  Cardiac Enzymes No results for input(s): CKMB, TROPONINI, MYOGLOBIN in the last 168 hours.  Invalid input(s): CK ------------------------------------------------------------------------------------------------------------------ No results found for: BNP   ---------------------------------------------------------------------------------------------------------------  Urinalysis    Component Value Date/Time   BILIRUBINUR negative 08/25/2015 1008   KETONESUR negative 08/25/2015 1008   PROTEINUR negative 08/25/2015 1008   UROBILINOGEN 0.2 08/25/2015 1008   NITRITE Negative 08/25/2015 1008   LEUKOCYTESUR Negative 08/25/2015 1008    ----------------------------------------------------------------------------------------------------------------   Imaging Results:    No results found.  My personal review of EKG: Tachycardia rate  123 /min, QTc 429 , no Acute ST changes   Assessment & Plan:    Active Problems:   Alcohol withdrawal (HCC)    Alcohol abuse/alcohol withdrawal.  -With evidence of alcohol withdrawal on presentation, despite alcohol level of 19, last drink was 9 PM overnight, he will be  admitted under stepdown CIWA protocol given he significantly tachycardic, with tremors, will be started on thiamine and folic acid as well. -We will consult social worker for resources for outpatient detox -Patient with significant tachycardia, pressures elevated, if no improvement after few Ativan pushes, will consider starting clonidine or beta-blockers which should help with both withdrawals and tachycardia. -We will keep on IV fluids.  And monitor labs closely.  Sinus tachycardia -Secondary to alcohol withdrawal, if no improvement with CIWA protocol will start on clonidine.  Transaminitis -The setting of alcohol abuse, repeat BMP in a.m.   DVT Prophylaxis   Lovenox - SCDs   AM Labs Ordered, also please review Full Orders  Family Communication: Admission, patients condition and plan of care including tests being ordered have been discussed with the patient and father at bedside who indicate understanding and agree with the plan and Code Status.  Code Status full  Likely DC to likely home, but will consult social worker for resources  Condition GUARDED    Consults called: none  Admission status: inpatient  Time spent in minutes : 55 minutes   Huey Bienenstockawood Kaliana Albino M.D on 06/03/2019 at 2:16 PM  Between 7am to 7pm - Pager - [phone]. After 7pm go to [email or web address] - password [email or web address]  [facility] - Office  [phone]

## 2019-06-03 NOTE — Plan of Care (Signed)
  Problem: Health Behavior/Discharge Planning: Goal: Ability to manage health-related needs will improve Outcome: Progressing   Problem: Clinical Measurements: Goal: Ability to maintain clinical measurements within normal limits will improve Outcome: Progressing   Problem: Activity: Goal: Risk for activity intolerance will decrease Outcome: Progressing   Problem: Nutrition: Goal: Adequate nutrition will be maintained Outcome: Progressing   Problem: Coping: Goal: Level of anxiety will decrease Outcome: Progressing   Problem: Elimination: Goal: Will not experience complications related to urinary retention Outcome: Progressing   Problem: Pain Managment: Goal: General experience of comfort will improve Outcome: Progressing   Problem: Safety: Goal: Ability to remain free from injury will improve Outcome: Progressing   Problem: Skin Integrity: Goal: Risk for impaired skin integrity will decrease Outcome: Progressing   Problem: Education: Goal: Knowledge of disease or condition will improve Outcome: Progressing   Problem: Health Behavior/Discharge Planning: Goal: Ability to identify changes in lifestyle to reduce recurrence of condition will improve Outcome: Progressing Goal: Identification of resources available to assist in meeting health care needs will improve Outcome: Progressing   Problem: Physical Regulation: Goal: Complications related to the disease process, condition or treatment will be avoided or minimized Outcome: Progressing

## 2019-06-03 NOTE — ED Notes (Addendum)
ED TO INPATIENT HANDOFF REPORT  ED Nurse Name and Phone #:  Nigel Mormon [phone]  S Name/Age/Gender Trevor [name] 38 y.o. male Room/Bed: 013C/013C  Code Status   Code Status: Full Code  Home/SNF/Other Home Patient oriented to: self, place, time and situation Is this baseline? Yes   Triage Complete: Triage complete  Chief Complaint detox  Triage Note Pt states he is trying to stop drinking on his own.  Drinks about a fifth of vodka a day . . . Last drank last night at 9 pm.  Visible tremmors.  HR 132.  Step mom says pt is trying to kill himself by drinking . . . Pt denies.   Allergies No Known Allergies  Level of Care/Admitting Diagnosis ED Disposition    ED Disposition Condition [facility] Area: [place] [[id number]]  Level of Care: Progressive [102]  Covid Evaluation: Asymptomatic Screening Protocol (No Symptoms)  Diagnosis: Alcohol withdrawal ([place]) [291.81.ICD-9-CM]  Admitting Physician: Manfred Shirts  Attending Physician: Waldron Labs, DAWOOD S [[id number]]  Estimated length of stay: 3 - 4 days  Certification:: I certify this patient will need inpatient services for at least 2 midnights  PT Class (Do Not Modify): Inpatient [101]  PT Acc Code (Do Not Modify): Private [1]       B Medical/Surgery History Past Medical History:  Diagnosis Date  . Elevated liver enzymes   . ETOH abuse   . 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 broken arm       A IV Location/Drains/Wounds Patient Lines/Drains/Airways Status   Active Line/Drains/Airways    Name:   Placement date:   Placement time:   Site:   Days:   Peripheral IV 06/03/19 Right Antecubital   06/03/19    1034    Antecubital   less than 1          Intake/Output Last 24 hours No intake or output data in the 24 hours ending 06/03/19 1509  Labs/Imaging Results  for orders placed or performed during the hospital encounter of 06/03/19 (from the past 48 hour(s))  Comprehensive metabolic panel     Status: Abnormal   Collection Time: 06/03/19 10:44 AM  Result Value Ref Range   Sodium 139 135 - 145 mmol/L   Potassium 4.1 3.5 - 5.1 mmol/L   Chloride 103 98 - 111 mmol/L   CO2 22 22 - 32 mmol/L   Glucose, Bld 123 (H) 70 - 99 mg/dL   BUN 7 6 - 20 mg/dL   Creatinine, Ser 0.70 0.61 - 1.24 mg/dL   Calcium 9.3 8.9 - 10.3 mg/dL   Total Protein 7.6 6.5 - 8.1 g/dL   Albumin 4.3 3.5 - 5.0 g/dL   AST 122 (H) 15 - 41 U/L   ALT 194 (H) 0 - 44 U/L   Alkaline Phosphatase 57 38 - 126 U/L   Total Bilirubin 0.9 0.3 - 1.2 mg/dL   GFR calc non Af Amer >60 >60 mL/min   GFR calc Af Amer >60 >60 mL/min   Anion gap 14 5 - 15    Comment: Performed at [facility], [address], [place], [place] [address]  Ethanol     Status: Abnormal   Collection Time: 06/03/19 10:44 AM  Result Value Ref Range   Alcohol, Ethyl (B) 19 (H) <10 mg/dL  Comment: (NOTE) Lowest detectable limit for serum alcohol is 10 mg/dL. For medical purposes only. Performed at [facility], [address] [place], [place] [address]   cbc     Status: Abnormal   Collection Time: 06/03/19 10:44 AM  Result Value Ref Range   WBC 5.6 4.0 - 10.5 K/uL   RBC 4.93 4.22 - 5.81 MIL/uL   Hemoglobin 17.1 (H) 13.0 - 17.0 g/dL   HCT 52.848.9 41.339.0 - 24.452.0 %   MCV 99.2 80.0 - 100.0 fL   MCH 34.7 (H) 26.0 - 34.0 pg   MCHC 35.0 30.0 - 36.0 g/dL   RDW 01.013.0 27.211.5 - 53.615.5 %   Platelets 270 150 - 400 K/uL   nRBC 0.0 0.0 - 0.2 %    Comment: Performed at [facility], [address] [place], [place] [address]  Rapid urine drug screen (hospital performed)     Status: None   Collection Time: 06/03/19  1:25 PM  Result Value Ref Range   Opiates NONE DETECTED NONE DETECTED   Cocaine NONE DETECTED NONE DETECTED   Benzodiazepines NONE DETECTED NONE DETECTED   Amphetamines NONE DETECTED NONE DETECTED    Tetrahydrocannabinol NONE DETECTED NONE DETECTED   Barbiturates NONE DETECTED NONE DETECTED    Comment: (NOTE) DRUG SCREEN FOR MEDICAL PURPOSES ONLY.  IF CONFIRMATION IS NEEDED FOR ANY PURPOSE, NOTIFY LAB WITHIN 5 DAYS. LOWEST DETECTABLE LIMITS FOR URINE DRUG SCREEN Drug Class                     Cutoff (ng/mL) Amphetamine and metabolites    1000 Barbiturate and metabolites    200 Benzodiazepine                 200 Tricyclics and metabolites     300 Opiates and metabolites        300 Cocaine and metabolites        300 THC                            50 Performed at [facility], [address] [place], [place] [address]    No results found.  Pending Labs [facility] (From admission, onward)    Start     Ordered   06/03/19 1108  SARS CORONAVIRUS 2 Nasal Swab Aptima Multi Swab  (Asymptomatic/Tier 2 Patients Labs)  Once,   STAT    Question Answer Comment  Is this test for diagnosis or screening Screening   Symptomatic for COVID-19 as defined by CDC No   Hospitalized for COVID-19 No   Admitted to ICU for COVID-19 No   Previously tested for COVID-19 No   Resident in a congregate (group) care setting No   Employed in healthcare setting No      06/03/19 1107   Signed and Held  HIV antibody (Routine Testing)  Once,   R     Signed and Held   Signed and Held  CBC  (enoxaparin (LOVENOX)    CrCl >/= 30 ml/min)  Once,   R    Comments: Baseline for enoxaparin therapy IF NOT ALREADY DRAWN.  Notify MD if PLT < 100 K.    Signed and Held   Signed and Held  Creatinine, serum  (enoxaparin (LOVENOX)    CrCl >/= 30 ml/min)  Once,   R    Comments: Baseline for enoxaparin therapy IF NOT ALREADY DRAWN.  Signed and Held   Signed and Held  Creatinine, serum  (enoxaparin (LOVENOX)    CrCl >/= 30 ml/min)  Weekly,   R    Comments: while on enoxaparin therapy    Signed and Held   Signed and Held  Comprehensive metabolic panel  Tomorrow morning,   R     Signed and Held   Signed and  Held  CBC  Tomorrow morning,   R     Signed and Held          Vitals/Pain Today's Vitals   06/03/19 1400 06/03/19 1424 06/03/19 1424 06/03/19 1500  BP: (!) 150/91  (!) 144/110 (!) 145/94  Pulse: (!) 120  (!) 116 (!) 113  Resp: 13   (!) 24  Temp:      TempSrc:      SpO2: 97%   97%  Weight:      Height:      PainSc:  0-No pain      Isolation Precautions No active isolations  Medications Medications  thiamine (VITAMIN B-1) tablet 100 mg ( Oral See Alternative 06/03/19 1042)    Or  thiamine (B-1) injection 100 mg (100 mg Intravenous Given 06/03/19 1042)  LORazepam (ATIVAN) injection 2-3 mg (has no administration in time range)  0.9 %  sodium chloride infusion ( Intravenous New Bag/Given 06/03/19 1451)  folic acid (FOLVITE) tablet 1 mg (has no administration in time range)  LORazepam (ATIVAN) injection 1 mg (1 mg Intravenous Given 06/03/19 1448)  sodium chloride 0.9 % 1,000 mL with thiamine 100 mg, folic acid 1 mg, multivitamins adult 10 mL infusion ( Intravenous New Bag/Given 06/03/19 1449)    Mobility walks     Focused Assessments Neuro Assessment Handoff: patient A&O x 4, here for alcohol detox; reports intermittent tremors and anxiety that is relieved by prn Ativan (CIWA protocol)   Cardiac Rhythm: Sinus tachycardia       Neuro Assessment:   Neuro Checks:      Last Documented NIHSS Modified Score:   Has TPA been given? No If patient is a Neuro Trauma and patient is going to OR before floor call report to 4N Charge nurse: [phone] or [phone]     R Recommendations: See Admitting Provider Note  Report given to: Santina Evansatherine, RN 5 [facility]  Additional Notes:

## 2019-06-03 NOTE — ED Provider Notes (Signed)
[facility] EMERGENC[facility] Provider Note   CSN: [id number] Arrival date & time: 06/03/19  [id number]    History   Chief Complaint Chief Complaint  Patient presents with   Alcohol Intoxication    HPI Trevor [name] is a 38 y.o. male.     HPI Patient states he has been drinking more than a fifth of vodka daily for the last 6 months.  States he started after he lost his job.  He denies any suicidal ideation.  Denies hallucinations.  States that he last drank alcohol at 9 PM yesterday evening.  He is here for detox.  States when he does not drink he begins to become tremulous.  Denies seizure activity.  Denies any other coingestions. Past Medical History:  Diagnosis Date   Elevated liver enzymes    ETOH abuse    HSV-2 (herpes simplex virus 2) infection    Left upper chest wall; diagnosed by culture    Patient Active Problem List   Diagnosis Date Noted   Anxiety state 01/09/2016   Obesity (BMI 30-39.9) 01/09/2016   HSV-2 (herpes simplex virus 2) infection     Past Surgical History:  Procedure Laterality Date   broken arm          Home Medications    Prior to Admission medications   Medication Sig Start Date End Date Taking? Authorizing Provider  acyclovir (ZOVIRAX) 400 MG tablet TAKE 1 TABLET BY MOUTH THREE TIMES DAILY FOR 5 DAYS 04/24/17  Yes Sheliah Hatchabori, Katherine E, MD  omeprazole (PRILOSEC) 40 MG capsule TAKE ONE CAPSULE BY MOUTH 30MIN PRIOR TO BREAKFAST 03/05/17  Yes Sheliah Hatchabori, Katherine E, MD  traZODone (DESYREL) 50 MG tablet TAKE 1/2 TO 1 TABLET BY MOUTH AT BEDTIME AS NEEDED FOR SLEEP 10/13/16  Yes Sheliah Hatchabori, Katherine E, MD  amoxicillin (AMOXIL) 875 MG tablet Take 1 tablet (875 mg total) by mouth 2 (two) times daily. Patient not taking: Reported on 06/03/2019 10/24/16   Sheliah Hatchabori, Katherine E, MD  atorvastatin (LIPITOR) 20 MG tablet Take 1 tablet (20 mg total) by mouth daily. Patient not taking: Reported on 06/03/2019 08/27/16   Sheliah Hatchabori, Katherine E, MD   buPROPion (WELLBUTRIN XL) 150 MG 24 hr tablet TAKE 1 TABLET BY MOUTH EVERY DAY Patient not taking: Reported on 06/03/2019 06/02/17   Sheliah Hatchabori, Katherine E, MD  clonazePAM Scarlette Calico(KLONOPIN) 0.5 MG tablet 1 tab prior to flight as needed Patient not taking: Reported on 06/03/2019 05/07/16   Sheliah Hatchabori, Katherine E, MD  diazepam (VALIUM) 5 MG tablet Take 1 tablet (5 mg total) by mouth every 12 (twelve) hours as needed for anxiety. Patient not taking: Reported on 06/03/2019 09/03/16   Sheliah Hatchabori, Katherine E, MD    Family History Family History  Problem Relation Age of Onset   Melanoma Mother    Cancer Mother        skin   Cancer Maternal Grandmother        lung   Cancer Maternal Grandfather        lung    Social History Social History   Tobacco Use   Smoking status: Never Smoker   Smokeless tobacco: Former NeurosurgeonUser    Types: Snuff  Substance Use Topics   Alcohol use: Yes    Comment: fifth liquor per day   Drug use: No     Allergies   Patient has no known allergies.   Review of Systems Review of Systems  Constitutional: Negative for chills and fever.  HENT: Negative for sore throat  and trouble swallowing.   Eyes: Negative for visual disturbance.  Respiratory: Negative for cough and shortness of breath.   Cardiovascular: Negative for chest pain.  Gastrointestinal: Negative for abdominal pain, constipation, diarrhea, nausea and vomiting.  Musculoskeletal: Negative for back pain, myalgias and neck pain.  Skin: Negative for rash and wound.  Neurological: Positive for tremors. Negative for dizziness, seizures, weakness, light-headedness, numbness and headaches.  Psychiatric/Behavioral: Negative for hallucinations and suicidal ideas. The patient is nervous/anxious.   All other systems reviewed and are negative.    Physical Exam Updated Vital Signs BP (!) 136/95    Pulse (!) 114    Temp 98.3 F (36.8 C) (Oral)    Resp (!) 23    Ht 5\' 9"  (1.753 m)    Wt 97.5 kg    SpO2 96%    BMI 31.75 kg/m     Physical Exam Vitals signs and nursing note reviewed.  Constitutional:      Appearance: He is well-developed.  HENT:     Head: Normocephalic and atraumatic.     Nose: Nose normal.     Mouth/Throat:     Mouth: Mucous membranes are moist.  Eyes:     Pupils: Pupils are equal, round, and reactive to light.  Neck:     Musculoskeletal: Normal range of motion and neck supple. No neck rigidity or muscular tenderness.  Cardiovascular:     Rate and Rhythm: Regular rhythm. Tachycardia present.     Heart sounds: No murmur. No friction rub. No gallop.   Pulmonary:     Effort: Pulmonary effort is normal. No respiratory distress.     Breath sounds: Normal breath sounds. No stridor. No wheezing, rhonchi or rales.  Chest:     Chest wall: No tenderness.  Abdominal:     General: Bowel sounds are normal.     Palpations: Abdomen is soft.     Tenderness: There is no abdominal tenderness. There is no guarding or rebound.  Musculoskeletal: Normal range of motion.        General: No tenderness.  Lymphadenopathy:     Cervical: No cervical adenopathy.  Skin:    General: Skin is warm and dry.     Findings: No erythema or rash.  Neurological:     General: No focal deficit present.     Mental Status: He is alert and oriented to person, place, and time.     Comments: Tremulous.  5/5 motor in all extremities.  Sensation intact.  Psychiatric:        Behavior: Behavior normal.     Comments: Denies SI/HI.  No hallucinations.      ED Treatments / Results  Labs (all labs ordered are listed, but only abnormal results are displayed) Labs Reviewed  COMPREHENSIVE METABOLIC PANEL - Abnormal; Notable for the following components:      Result Value   Glucose, Bld 123 (*)    AST 122 (*)    ALT 194 (*)    All other components within normal limits  ETHANOL - Abnormal; Notable for the following components:   Alcohol, Ethyl (B) 19 (*)    All other components within normal limits  CBC - Abnormal; Notable  for the following components:   Hemoglobin 17.1 (*)    MCH 34.7 (*)    All other components within normal limits  SARS CORONAVIRUS 2  RAPID URINE DRUG SCREEN, HOSP PERFORMED    EKG EKG Interpretation  Date/Time:  Friday June 03 2019 10:12:50 EDT Ventricular Rate:  123 PR Interval:  126 QRS Duration: 82 QT Interval:  300 QTC Calculation: 429 R Axis:   78 Text Interpretation:  Sinus tachycardia Cannot rule out Anterior infarct , age undetermined T wave abnormality, consider inferior ischemia Abnormal ECG Confirmed by Loren RacerYelverton, Sheril Hammond ([id number]) on 06/03/2019 10:55:16 AM   Radiology No results found.  Procedures Procedures (including critical care time)  Medications Ordered in ED Medications  LORazepam (ATIVAN) injection 0-4 mg (2 mg Intravenous Given 06/03/19 1042)    Or  LORazepam (ATIVAN) tablet 0-4 mg ( Oral See Alternative 06/03/19 1042)  LORazepam (ATIVAN) injection 0-4 mg (has no administration in time range)    Or  LORazepam (ATIVAN) tablet 0-4 mg (has no administration in time range)  thiamine (VITAMIN B-1) tablet 100 mg ( Oral See Alternative 06/03/19 1042)    Or  thiamine (B-1) injection 100 mg (100 mg Intravenous Given 06/03/19 1042)     Initial Impression / Assessment and Plan / ED Course  I have reviewed the triage vital signs and the nursing notes.  Pertinent labs & imaging results that were available during my care of the patient were reviewed by me and considered in my medical decision making (see chart for details).        Initiated CIWA protocol.  Patient's heart rate and tremors have improved after IV Ativan.  Discussed with hospitalist will see patient emergency department and admit.  Final Clinical Impressions(s) / ED Diagnoses   Final diagnoses:  Alcohol withdrawal syndrome without complication [facility](HCC)    ED Discharge Orders    None       Loren RacerYelverton, Kuron Docken, MD 06/03/19 1354

## 2019-06-03 NOTE — Progress Notes (Signed)
Admit to [facility] Pt here for alcohol detox. IVF and multivitamin bag running. Progressive monitor set up. Pt tachycardic, sustaining 110s to 120s. BP elevated. Pt with 6/10 headache, tremor when moving and palpable at rest, mild pins and needles. No nausea. CIWA score 8, will recheck. Discussed with patient plan of care.

## 2019-06-03 NOTE — ED Triage Notes (Signed)
Pt states he is trying to stop drinking on his own.  Drinks about a fifth of vodka a day . . . Last drank last night at 9 pm.  Visible tremmors.  HR 132.  Step mom says pt is trying to kill himself by drinking . . . Pt denies.

## 2019-06-04 DIAGNOSIS — K7010 Alcoholic hepatitis without ascites: Secondary | ICD-10-CM

## 2019-06-04 DIAGNOSIS — I158 Other secondary hypertension: Secondary | ICD-10-CM

## 2019-06-04 LAB — MRSA PCR SCREENING: MRSA by PCR: NEGATIVE

## 2019-06-04 LAB — COMPREHENSIVE METABOLIC PANEL
ALT: 138 U/L — ABNORMAL HIGH (ref 0–44)
AST: 80 U/L — ABNORMAL HIGH (ref 15–41)
Albumin: 3.7 g/dL (ref 3.5–5.0)
Alkaline Phosphatase: 44 U/L (ref 38–126)
Anion gap: 13 (ref 5–15)
BUN: 7 mg/dL (ref 6–20)
CO2: 24 mmol/L (ref 22–32)
Calcium: 8.9 mg/dL (ref 8.9–10.3)
Chloride: 100 mmol/L (ref 98–111)
Creatinine, Ser: 0.79 mg/dL (ref 0.61–1.24)
GFR calc Af Amer: >60 mL/min (ref >60)
GFR calc non Af Amer: >60 mL/min (ref >60)
Glucose, Bld: 87 mg/dL (ref 70–99)
Potassium: 3.2 mmol/L — ABNORMAL LOW (ref 3.5–5.1)
Sodium: 137 mmol/L (ref 135–145)
Total Bilirubin: 1.1 mg/dL (ref 0.3–1.2)
Total Protein: 6.4 g/dL — ABNORMAL LOW (ref 6.5–8.1)

## 2019-06-04 LAB — CBC
HCT: 41.6 % (ref 39.0–52.0)
Hemoglobin: 14.5 g/dL (ref 13.0–17.0)
MCH: 34.1 pg — ABNORMAL HIGH (ref 26.0–34.0)
MCHC: 34.9 g/dL (ref 30.0–36.0)
MCV: 97.9 fL (ref 80.0–100.0)
Platelets: 230 10*3/uL (ref 150–400)
RBC: 4.25 MIL/uL (ref 4.22–5.81)
RDW: 13 % (ref 11.5–15.5)
WBC: 5.3 10*3/uL (ref 4.0–10.5)
nRBC: 0 % (ref 0.0–0.2)

## 2019-06-04 LAB — HIV ANTIBODY (ROUTINE TESTING W REFLEX): HIV Screen 4th Generation wRfx: NONREACTIVE

## 2019-06-04 MED ORDER — IBUPROFEN 100 MG PO CHEW
400.0000 mg | CHEWABLE_TABLET | Freq: Three times a day (TID) | ORAL | Status: DC | PRN
  Filled 2019-06-04: qty 4

## 2019-06-04 MED ORDER — CHLORDIAZEPOXIDE HCL 25 MG PO CAPS
25.0000 mg | ORAL_CAPSULE | Freq: Three times a day (TID) | ORAL | Status: DC
  Administered 2019-06-04 – 2019-06-05 (×3): 25 mg via ORAL
  Filled 2019-06-04 (×3): qty 1

## 2019-06-04 MED ORDER — AMLODIPINE BESYLATE 2.5 MG PO TABS
2.5000 mg | ORAL_TABLET | Freq: Every day | ORAL | Status: DC
  Administered 2019-06-04: 18:00:00 2.5 mg via ORAL
  Filled 2019-06-04: qty 1

## 2019-06-04 MED ORDER — CLONIDINE HCL 0.1 MG PO TABS: 0.1000 mg | ORAL_TABLET | Freq: Three times a day (TID) | ORAL | Status: DC | PRN

## 2019-06-04 MED ORDER — FAMOTIDINE 20 MG PO TABS
20.0000 mg | ORAL_TABLET | Freq: Every day | ORAL | Status: DC
  Administered 2019-06-04 – 2019-06-05 (×2): 20 mg via ORAL
  Filled 2019-06-04 (×2): qty 1

## 2019-06-04 NOTE — Progress Notes (Signed)
PROGRESS NOTE    Trevor [name]  YQM:[id number]  DOB: 1980-12-13  DOA: 06/03/2019 PCP: Midge Minium, MD  Brief Narrative:  38 y.o. male, with past medical history of alcohol abuse since age 65,who was at [facility] last January for 28 days started drinking heavily,fifth of vodka every night, since he lost his job in March. He presented to ED for detox as he decided to stop drinking. Last drink 9pm on 8/6. In the ED patient was noted to be tachycardic, with significant tremors, alcohol withdrawal, required multiple Ativan pushes in ED, elevated LFTs, with alcohol level of 19, and negative urine drug screen. Admitted to Hospitalist service with CIWA protocol.   Subjective: Patient received 5 mg Ativan yesterday and 2 mg around 2 am today.  He feels okay today   Objective: Vitals:   06/04/19 0007 06/04/19 0200 06/04/19 0538 06/04/19 0600  BP: (!) 142/96 (!) 142/99  (!) 139/101  Pulse: 85 90  72  Resp: 18   15  Temp: 98.6 F (37 C)  98 F (36.7 C)   TempSrc: Oral  Oral   SpO2: 98% 96%  95%  Weight:      Height:        Intake/Output Summary (Last 24 hours) at 06/04/2019 0800 Last data filed at 06/03/2019 1700 Gross per 24 hour  Intake 469.37 ml  Output -  Net 469.37 ml   Filed Weights   06/03/19 1015  Weight: 97.5 kg    Physical Examination:  General exam: Appears calm, slightly tremulous Respiratory system: Clear to auscultation. Respiratory effort normal. Cardiovascular system: S1 & S2 heard, RRR. No JVD, murmurs, rubs, gallops or clicks. No pedal edema. Gastrointestinal system: Abdomen is nondistended, soft and nontender. No organomegaly or masses felt. Normal bowel sounds heard. Central nervous system: Alert and oriented. No focal neurological deficits except mild intentional tremors Extremities: Symmetric 5 x 5 power. Skin: No rashes, lesions or ulcers Psychiatry: Judgement and insight appear normal. Mood & affect appropriate.     Data Reviewed: I  have personally reviewed following labs and imaging studies  CBC: Recent Labs  Lab 06/03/19 1044 06/03/19 1709 06/04/19 0144  WBC 5.6 7.1 5.3  HGB 17.1* 16.5 14.5  HCT 48.9 45.8 41.6  MCV 99.2 97.7 97.9  PLT 270 254 528   Basic Metabolic Panel: Recent Labs  Lab 06/03/19 1044 06/03/19 1709 06/04/19 0144  NA 139  --  137  K 4.1  --  3.2*  CL 103  --  100  CO2 22  --  24  GLUCOSE 123*  --  87  BUN 7  --  7  CREATININE 0.70 0.69 0.79  CALCIUM 9.3  --  8.9   GFR: Estimated Creatinine Clearance: 144.1 mL/min (by C-G formula based on SCr of 0.79 mg/dL). Liver Function Tests: Recent Labs  Lab 06/03/19 1044 06/04/19 0144  AST 122* 80*  ALT 194* 138*  ALKPHOS 57 44  BILITOT 0.9 1.1  PROT 7.6 6.4*  ALBUMIN 4.3 3.7   No results for input(s): LIPASE, AMYLASE in the last 168 hours. No results for input(s): AMMONIA in the last 168 hours. Coagulation Profile: No results for input(s): INR, PROTIME in the last 168 hours. Cardiac Enzymes: No results for input(s): CKTOTAL, CKMB, CKMBINDEX, TROPONINI in the last 168 hours. BNP (last 3 results) No results for input(s): PROBNP in the last 8760 hours. HbA1C: No results for input(s): HGBA1C in the last 72 hours. CBG: No results for input(s): GLUCAP in  the last 168 hours. Lipid Profile: No results for input(s): CHOL, HDL, LDLCALC, TRIG, CHOLHDL, LDLDIRECT in the last 72 hours. Thyroid Function Tests: No results for input(s): TSH, T4TOTAL, FREET4, T3FREE, THYROIDAB in the last 72 hours. Anemia Panel: No results for input(s): VITAMINB12, FOLATE, FERRITIN, TIBC, IRON, RETICCTPCT in the last 72 hours. Sepsis Labs: No results for input(s): PROCALCITON, LATICACIDVEN in the last 168 hours.  Recent Results (from the past 240 hour(s))  SARS CORONAVIRUS 2 Nasal Swab Aptima Multi Swab     Status: None   Collection Time: 06/03/19 12:12 PM   Specimen: Aptima Multi Swab; Nasal Swab  Result Value Ref Range Status   SARS Coronavirus 2  NEGATIVE NEGATIVE Final    Comment: (NOTE) SARS-CoV-2 target nucleic acids are NOT DETECTED. The SARS-CoV-2 RNA is generally detectable in upper and lower respiratory specimens during the acute phase of infection. Negative results do not preclude SARS-CoV-2 infection, do not rule out co-infections with other pathogens, and should not be used as the sole basis for treatment or other patient management decisions. Negative results must be combined with clinical observations, patient history, and epidemiological information. The expected result is Negative. Fact Sheet for Patients: HairSlick.nohttps://www.fda.gov/media/138098/download Fact Sheet for Healthcare Providers: quierodirigir.comhttps://www.fda.gov/media/138095/download This test is not yet approved or cleared by the [place] FDA and  has been authorized for detection and/or diagnosis of SARS-CoV-2 by FDA under an Emergency Use Authorization (EUA). This EUA will remain  in effect (meaning this test can be used) for the duration of the COVID-19 declaration under Section 56 4(b)(1) of the Act, 21 U.S.C. section 360bbb-3(b)(1), unless the authorization is terminated or revoked sooner. Performed at [facility], [address] [place], [place] [address]       Radiology Studies: No results found.      Scheduled Meds: . enoxaparin (LOVENOX) injection  40 mg Subcutaneous Q24H  . folic acid  1 mg Oral Daily  . thiamine  100 mg Oral Daily   Or  . thiamine  100 mg Intravenous Daily   Continuous Infusions: . sodium chloride 75 mL/hr at 06/03/19 1451    Assessment & Plan:    1. Acute alcohol withdrawal: Continue CIWA protocol. Still hypertensive, tachycardia improved.Will start librium at low dose for 3 day taper/detox. Continue MVI/thiamine/folate. Will need outpatient substance abuse center referral.   2. Alcoholic hepatitis: improving. Alcohol level 19 on presentation. HIV -ve  3. HTN: in the setting of #1. Add low dose norvasc and  clonidine prn  4. Alcohol dependence: Counseled to quit. Prior cocaine user. UDS -ve this admission  DVT prophylaxis: lovenox Code Status: full code Family / Patient Communication: d/w patient Disposition Plan: home when medically cleared     LOS: 1 day    Time spent: 25 minutes    Alessandra BevelsNeelima Youcef Klas, MD [facility] Pager [phone]  If 7PM-7AM, please contact night-coverage [email or web address] Password [email or web address] 06/04/2019, 8:00 AM

## 2019-06-04 NOTE — TOC Initial Note (Signed)
Transition of Care [facility]) - Initial/Assessment Note    Patient Details  Name: Trevor [name] MRN: [id number] Date of Birth: 25-Oct-1981  Transition of [facility]) CM/SW Contact:    Candie Chroman, LCSW Phone Number: 06/04/2019, 12:09 PM  Clinical Narrative: CSW met with patient. Father at bedside but CSW asked him to step out during assessment. CSW introduced role and inquired about interest in substance use treatment resources. Patient confirmed. CSW provided list of inpatient and outpatient options. Informed him that with many of the inpatient facilities, there is a wait list due to [employer] restrictions and being able to only take a limited number of people compared to before. CSW encouraged him to notify the unit CSW if he finds one he is interested in and a referral can be started to get him on a wait list. No further concerns. CSW encouraged patient to contact CSW as needed. CSW will continue to follow patient for support and facilitate inpatient substance use treatment if patient is interested by time of discharge.                 Expected Discharge Plan: Home/Self Care Barriers to Discharge: Continued Medical Work up   Patient Goals and CMS Choice        Expected Discharge Plan and Services Expected Discharge Plan: Home/Self Care       Living arrangements for the past 2 months: Single Family Home                                      Prior Living Arrangements/Services Living arrangements for the past 2 months: Single Family Home   Patient language and need for interpreter reviewed:: Yes Do you feel safe going back to the place where you live?: Yes      Need for Family Participation in Patient Care: Yes (Comment) Care giver support system in place?: Yes (comment)   Criminal Activity/Legal Involvement Pertinent to Current Situation/Hospitalization: No - Comment as needed  Activities of Daily Living Home Assistive Devices/Equipment: None ADL Screening (condition at  time of admission) Patient's cognitive ability adequate to safely complete daily activities?: Yes Is the patient deaf or have difficulty hearing?: No Does the patient have difficulty seeing, even when wearing glasses/contacts?: No Does the patient have difficulty concentrating, remembering, or making decisions?: No Patient able to express need for assistance with ADLs?: Yes Does the patient have difficulty dressing or bathing?: No Independently performs ADLs?: Yes (appropriate for developmental age) Does the patient have difficulty walking or climbing stairs?: No Weakness of Legs: None Weakness of Arms/Hands: None  Permission Sought/Granted                  Emotional Assessment Appearance:: Appears stated age Attitude/Demeanor/Rapport: Engaged, Gracious Affect (typically observed): Accepting, Appropriate, Calm, Pleasant Orientation: : Oriented to Self, Oriented to Place, Oriented to  Time, Oriented to Situation Alcohol / Substance Use: Alcohol Use Psych Involvement: No (comment)  Admission diagnosis:  Alcohol withdrawal syndrome without complication ([place]) [T02.409] Patient Active Problem List   Diagnosis Date Noted  . Alcohol withdrawal ([place]) 06/03/2019  . Anxiety state 01/09/2016  . Obesity (BMI 30-39.9) 01/09/2016  . HSV-2 (herpes simplex virus 2) infection    PCP:  Midge Minium, MD Pharmacy:   [id number]: [phone]: [phone]  Social Determinants of Health (SDOH) Interventions    Readmission Risk Interventions No flowsheet data found.

## 2019-06-05 DIAGNOSIS — Z9119 Patient's noncompliance with other medical treatment and regimen: Secondary | ICD-10-CM

## 2019-06-05 DIAGNOSIS — K7010 Alcoholic hepatitis without ascites: Secondary | ICD-10-CM

## 2019-06-05 HISTORY — DX: Alcoholic hepatitis without ascites: K70.10

## 2019-06-05 LAB — BASIC METABOLIC PANEL
Anion gap: 11 (ref 5–15)
BUN: 6 mg/dL (ref 6–20)
CO2: 24 mmol/L (ref 22–32)
Calcium: 8.8 mg/dL — ABNORMAL LOW (ref 8.9–10.3)
Chloride: 103 mmol/L (ref 98–111)
Creatinine, Ser: 0.67 mg/dL (ref 0.61–1.24)
GFR calc Af Amer: >60 mL/min (ref >60)
GFR calc non Af Amer: >60 mL/min (ref >60)
Glucose, Bld: 137 mg/dL — ABNORMAL HIGH (ref 70–99)
Potassium: 3.1 mmol/L — ABNORMAL LOW (ref 3.5–5.1)
Sodium: 138 mmol/L (ref 135–145)

## 2019-06-05 MED ORDER — KETOROLAC TROMETHAMINE 30 MG/ML IJ SOLN: 30.0000 mg | Freq: Once | INTRAMUSCULAR | Status: DC

## 2019-06-05 MED ORDER — TRAZODONE HCL 50 MG PO TABS
50.0000 mg | ORAL_TABLET | Freq: Once | ORAL | Status: AC
  Administered 2019-06-05: 50 mg via ORAL
  Filled 2019-06-05: qty 1

## 2019-06-05 NOTE — Progress Notes (Signed)
Patient requested to leave AMA. Patient was informed about the risks that he is taking leaving AMA but he insisted to leave. After MD talked with him and his father patient signed the paper and left with his father.

## 2019-06-05 NOTE — Progress Notes (Signed)
Patient is decided to leave tonight. He doesn't want to go home with his father and he said he "will figure out" where he is going. MD made aware.

## 2019-06-05 NOTE — Discharge Summary (Addendum)
Physician Discharge Summary  Fernande BoydenWilliam R Kot UJW:[id number] DOB: 05-24-1981 DOA: 06/03/2019  PCP: Sheliah Hatchabori, Katherine E, MD  Admit date: 06/03/2019 Discharge date: 06/05/2019 Consultations: None Admitted From: Home Disposition: Left AMA  Discharge Diagnoses:  Active Problems:   Alcohol withdrawal (HCC)   Alcoholic hepatitis   Polysubstance abuse     Hospital Course Summary: 38 y.o.male,with past medical history of alcohol abuse since age 38,who was at [facility] for 28 days started drinking heavily,fifth of vodka every night, since he lost his job in March. He presented to ED for detox as he decided to stop drinking. Last drink 9pm on 8/6. In the EDpatient was noted to be tachycardic, with significant tremors, alcohol withdrawal, required multiple Ativan pushes in ED, elevated LFTs, with alcohol level of 19, and negative urine drug screen (he has passed history of cocaine use). Admitted to Hospitalist service with CIWA protocol.   Patient had minimal Ativan requirement on the night of admission (4 mg).  He was started on Librium precociously at 25 mg 3 times daily given prior history of severe withdrawals.  Patient was advised that he could be discharged in 3 days after Librium detox if he did not have any significant requirements on additional Ativan per CIWA protocol.  Patient this morning when seen in rounds stated he felt good and wished to go home.  He stated he has already called his father who is on his way.  Patient stated he received information regarding substance abuse centers in the area and intends to voluntarily commit himself to rehab programs.  He did have mild elevation in LFTs, likely secondary to alcoholic hepatitis which down trended with hydration.  Shortly after rounds, when [facility], it appeared that patient in fact required 15 mg of additional Ativan since midnight.  I advised patient to stay and offered to increase Librium dose.  Patient currently  oriented to place person and time.  He was able to tell me the name of the president and his sense of direction was intact.  He was able to tell me the reason for hospitalization and verbalized understanding of complications if he left prematurely.  I discussed with patient on 2 occasions (in presence of father in the room and when father stepped out) regarding additional Ativan requirement overnight and what it means to leave AMA today in presence of his bedside nurse, Edgardo RoysGreta. He stated he understands the fatal risk of worsening tachycardia/arrhythmia/seizures and other complications if he chose to leave AMA. Also discussed with father outside the room who understands patient is oriented x3 and is leaving AMA by his choice although it is a bad choice.  Father intends to provide ride to the patient.  Discharge Exam:  Vitals:   06/05/19 0900 06/05/19 1200  BP: 131/76 (!) 135/96  Pulse:  90  Resp:  17  Temp: 97.9 F (36.6 C) 97.7 F (36.5 C)  SpO2:  97%   Vitals:   06/05/19 0758 06/05/19 0857 06/05/19 0900 06/05/19 1200  BP: 128/90  131/76 (!) 135/96  Pulse: 83 74  90  Resp:    17  Temp:   97.9 F (36.6 C) 97.7 F (36.5 C)  TempSrc:   Oral Oral  SpO2:    97%  Weight:      Height:          Discharge Instructions:     No Known Allergies    The results of significant diagnostics from this hospitalization (including imaging, microbiology, ancillary and  laboratory) are listed below for reference.    Labs: BNP (last 3 results) No results for input(s): BNP in the last 8760 hours. Basic Metabolic Panel: Recent Labs  Lab 06/03/19 1044 06/03/19 1709 06/04/19 0144 06/05/19 0901  NA 139  --  137 138  K 4.1  --  3.2* 3.1*  CL 103  --  100 103  CO2 22  --  24 24  GLUCOSE 123*  --  87 137*  BUN 7  --  7 6  CREATININE 0.70 0.69 0.79 0.67  CALCIUM 9.3  --  8.9 8.8*   Liver Function Tests: Recent Labs  Lab 06/03/19 1044 06/04/19 0144  AST 122* 80*  ALT 194* 138*  ALKPHOS  57 44  BILITOT 0.9 1.1  PROT 7.6 6.4*  ALBUMIN 4.3 3.7   No results for input(s): LIPASE, AMYLASE in the last 168 hours. No results for input(s): AMMONIA in the last 168 hours. CBC: Recent Labs  Lab 06/03/19 1044 06/03/19 1709 06/04/19 0144  WBC 5.6 7.1 5.3  HGB 17.1* 16.5 14.5  HCT 48.9 45.8 41.6  MCV 99.2 97.7 97.9  PLT 270 254 230   Cardiac Enzymes: No results for input(s): CKTOTAL, CKMB, CKMBINDEX, TROPONINI in the last 168 hours. BNP: Invalid input(s): POCBNP CBG: No results for input(s): GLUCAP in the last 168 hours. D-Dimer No results for input(s): DDIMER in the last 72 hours. Hgb A1c No results for input(s): HGBA1C in the last 72 hours. Lipid Profile No results for input(s): CHOL, HDL, LDLCALC, TRIG, CHOLHDL, LDLDIRECT in the last 72 hours. Thyroid function studies No results for input(s): TSH, T4TOTAL, T3FREE, THYROIDAB in the last 72 hours.  Invalid input(s): FREET3 Anemia work up No results for input(s): VITAMINB12, FOLATE, FERRITIN, TIBC, IRON, RETICCTPCT in the last 72 hours. Urinalysis    Component Value Date/Time   BILIRUBINUR negative 08/25/2015 1008   KETONESUR negative 08/25/2015 1008   PROTEINUR negative 08/25/2015 1008   UROBILINOGEN 0.2 08/25/2015 1008   NITRITE Negative 08/25/2015 1008   LEUKOCYTESUR Negative 08/25/2015 1008   Sepsis Labs Invalid input(s): PROCALCITONIN,  WBC,  LACTICIDVEN Microbiology Recent Results (from the past 240 hour(s))  SARS CORONAVIRUS 2 Nasal Swab Aptima Multi Swab     Status: None   Collection Time: 06/03/19 12:12 PM   Specimen: Aptima Multi Swab; Nasal Swab  Result Value Ref Range Status   SARS Coronavirus 2 NEGATIVE NEGATIVE Final    Comment: (NOTE) SARS-CoV-2 target nucleic acids are NOT DETECTED. The SARS-CoV-2 RNA is generally detectable in upper and lower respiratory specimens during the acute phase of infection. Negative results do not preclude SARS-CoV-2 infection, do not rule out co-infections  with other pathogens, and should not be used as the sole basis for treatment or other patient management decisions. Negative results must be combined with clinical observations, patient history, and epidemiological information. The expected result is Negative. Fact Sheet for Patients: SugarRoll.be Fact Sheet for Healthcare Providers: https://www.woods-mathews.com/ This test is not yet approved or cleared by the [place] FDA and  has been authorized for detection and/or diagnosis of SARS-CoV-2 by FDA under an Emergency Use Authorization (EUA). This EUA will remain  in effect (meaning this test can be used) for the duration of the COVID-19 declaration under Section 56 4(b)(1) of the Act, 21 U.S.C. section 360bbb-3(b)(1), unless the authorization is terminated or revoked sooner. Performed at [facility], [place] [address], [place], [place] [address]   MRSA PCR Screening     Status: None  Collection Time: 06/04/19 10:23 AM   Specimen: Nasopharyngeal  Result Value Ref Range Status   MRSA by PCR NEGATIVE NEGATIVE Final    Comment:        The GeneXpert MRSA Assay (FDA approved for NASAL specimens only), is one component of a comprehensive MRSA colonization surveillance program. It is not intended to diagnose MRSA infection nor to guide or monitor treatment for MRSA infections. Performed at [facility], [address] [place], [place] [address]     Procedures/Studies:  No results found.  Time coordinating discharge: Over 30 minutes  SIGNED:   Alessandra BevelsNeelima Nyan Dufresne, MD  [facility] 06/05/2019, 4:04 PM Pager : [phone]

## 2019-06-05 NOTE — Progress Notes (Signed)
Patient's father came to visit him and we make him known that patient will be discharged home today, per MD verbal order. Father said that patient doesn't have a home to go, he doesn't have any insurance, and he would like to talk with patient's MD. MD was notified. Will continue to monitor.

## 2019-06-07 ENCOUNTER — Encounter (HOSPITAL_COMMUNITY): Payer: Self-pay | Admitting: Emergency Medicine

## 2019-06-07 ENCOUNTER — Inpatient Hospital Stay (HOSPITAL_COMMUNITY)
Admission: EM | Admit: 2019-06-07 | Discharge: 2019-06-11 | DRG: 897 | Disposition: A | Discharge status: Home / Self Care | Payer: Self-pay | Attending: Internal Medicine | Admitting: Internal Medicine

## 2019-06-07 ENCOUNTER — Other Ambulatory Visit: Payer: Self-pay

## 2019-06-07 DIAGNOSIS — F10230 Alcohol dependence with withdrawal, uncomplicated: Secondary | ICD-10-CM

## 2019-06-07 DIAGNOSIS — F1020 Alcohol dependence, uncomplicated: Secondary | ICD-10-CM | POA: Diagnosis present

## 2019-06-07 DIAGNOSIS — R45851 Suicidal ideations: Secondary | ICD-10-CM

## 2019-06-07 DIAGNOSIS — E669 Obesity, unspecified: Secondary | ICD-10-CM | POA: Diagnosis present

## 2019-06-07 DIAGNOSIS — F10931 Alcohol use, unspecified with withdrawal delirium: Secondary | ICD-10-CM

## 2019-06-07 DIAGNOSIS — B009 Herpesviral infection, unspecified: Secondary | ICD-10-CM | POA: Diagnosis present

## 2019-06-07 DIAGNOSIS — Z808 Family history of malignant neoplasm of other organs or systems: Secondary | ICD-10-CM

## 2019-06-07 DIAGNOSIS — F10239 Alcohol dependence with withdrawal, unspecified: Secondary | ICD-10-CM | POA: Diagnosis present

## 2019-06-07 DIAGNOSIS — Z560 Unemployment, unspecified: Secondary | ICD-10-CM

## 2019-06-07 DIAGNOSIS — A6000 Herpesviral infection of urogenital system, unspecified: Secondary | ICD-10-CM | POA: Diagnosis present

## 2019-06-07 DIAGNOSIS — F10939 Alcohol use, unspecified with withdrawal, unspecified: Secondary | ICD-10-CM | POA: Diagnosis present

## 2019-06-07 DIAGNOSIS — F10231 Alcohol dependence with withdrawal delirium: Principal | ICD-10-CM | POA: Diagnosis present

## 2019-06-07 DIAGNOSIS — Y900 Blood alcohol level of less than 20 mg/100 ml: Secondary | ICD-10-CM | POA: Diagnosis present

## 2019-06-07 DIAGNOSIS — Z20828 Contact with and (suspected) exposure to other viral communicable diseases: Secondary | ICD-10-CM | POA: Diagnosis present

## 2019-06-07 DIAGNOSIS — K7010 Alcoholic hepatitis without ascites: Secondary | ICD-10-CM | POA: Diagnosis present

## 2019-06-07 DIAGNOSIS — Z6832 Body mass index (BMI) 32.0-32.9, adult: Secondary | ICD-10-CM

## 2019-06-07 DIAGNOSIS — F10930 Alcohol use, unspecified with withdrawal, uncomplicated: Secondary | ICD-10-CM

## 2019-06-07 DIAGNOSIS — Z9119 Patient's noncompliance with other medical treatment and regimen: Secondary | ICD-10-CM

## 2019-06-07 DIAGNOSIS — L409 Psoriasis, unspecified: Secondary | ICD-10-CM

## 2019-06-07 DIAGNOSIS — Z79899 Other long term (current) drug therapy: Secondary | ICD-10-CM

## 2019-06-07 DIAGNOSIS — Z720 Tobacco use: Secondary | ICD-10-CM

## 2019-06-07 HISTORY — DX: Suicidal ideations: R45.851

## 2019-06-07 HISTORY — DX: Alcohol use, unspecified with withdrawal delirium: F10.931

## 2019-06-07 LAB — CBC
HCT: 45.4 % (ref 39.0–52.0)
Hemoglobin: 15.9 g/dL (ref 13.0–17.0)
MCH: 35.3 pg — ABNORMAL HIGH (ref 26.0–34.0)
MCHC: 35 g/dL (ref 30.0–36.0)
MCV: 100.7 fL — ABNORMAL HIGH (ref 80.0–100.0)
Platelets: 257 10*3/uL (ref 150–400)
RBC: 4.51 MIL/uL (ref 4.22–5.81)
RDW: 13.2 % (ref 11.5–15.5)
WBC: 9.9 10*3/uL (ref 4.0–10.5)
nRBC: 0 % (ref 0.0–0.2)

## 2019-06-07 LAB — RAPID URINE DRUG SCREEN, HOSP PERFORMED
Amphetamines: NOT DETECTED
Barbiturates: NOT DETECTED
Benzodiazepines: POSITIVE — AB
Cocaine: NOT DETECTED
Opiates: NOT DETECTED
Tetrahydrocannabinol: NOT DETECTED

## 2019-06-07 LAB — COMPREHENSIVE METABOLIC PANEL
ALT: 210 U/L — ABNORMAL HIGH (ref 0–44)
AST: 113 U/L — ABNORMAL HIGH (ref 15–41)
Albumin: 4.3 g/dL (ref 3.5–5.0)
Alkaline Phosphatase: 54 U/L (ref 38–126)
Anion gap: 12 (ref 5–15)
BUN: 13 mg/dL (ref 6–20)
CO2: 22 mmol/L (ref 22–32)
Calcium: 9.5 mg/dL (ref 8.9–10.3)
Chloride: 105 mmol/L (ref 98–111)
Creatinine, Ser: 0.76 mg/dL (ref 0.61–1.24)
GFR calc Af Amer: >60 mL/min (ref >60)
GFR calc non Af Amer: >60 mL/min (ref >60)
Glucose, Bld: 116 mg/dL — ABNORMAL HIGH (ref 70–99)
Potassium: 4 mmol/L (ref 3.5–5.1)
Sodium: 139 mmol/L (ref 135–145)
Total Bilirubin: 1.3 mg/dL — ABNORMAL HIGH (ref 0.3–1.2)
Total Protein: 7.4 g/dL (ref 6.5–8.1)

## 2019-06-07 LAB — ETHANOL: Alcohol, Ethyl (B): <10 mg/dL (ref <10)

## 2019-06-07 LAB — ACETAMINOPHEN LEVEL: Acetaminophen (Tylenol), Serum: <10 ug/mL — ABNORMAL LOW (ref 10–30)

## 2019-06-07 LAB — SALICYLATE LEVEL: Salicylate Lvl: <7 mg/dL (ref 2.8–30.0)

## 2019-06-07 LAB — SARS CORONAVIRUS 2 (TAT 6-24 HRS): SARS Coronavirus 2: NEGATIVE

## 2019-06-07 MED ORDER — LORAZEPAM 1 MG PO TABS
0.0000 mg | ORAL_TABLET | Freq: Four times a day (QID) | ORAL | Status: AC
  Administered 2019-06-07: 2 mg via ORAL
  Administered 2019-06-07 – 2019-06-09 (×4): 1 mg via ORAL
  Filled 2019-06-07: qty 1
  Filled 2019-06-07: qty 2
  Filled 2019-06-07: qty 1
  Filled 2019-06-07: qty 2
  Filled 2019-06-07: qty 1

## 2019-06-07 MED ORDER — ONDANSETRON HCL 4 MG PO TABS: 4.0000 mg | ORAL_TABLET | Freq: Four times a day (QID) | ORAL | Status: DC | PRN

## 2019-06-07 MED ORDER — VITAMIN B-1 100 MG PO TABS
100.0000 mg | ORAL_TABLET | Freq: Every day | ORAL | Status: DC
  Administered 2019-06-08 – 2019-06-11 (×4): 100 mg via ORAL
  Filled 2019-06-07 (×5): qty 1

## 2019-06-07 MED ORDER — LORAZEPAM 2 MG/ML IJ SOLN
0.0000 mg | Freq: Two times a day (BID) | INTRAMUSCULAR | Status: DC
  Administered 2019-06-09: 22:00:00 2 mg via INTRAVENOUS
  Administered 2019-06-10: 22:00:00 1 mg via INTRAVENOUS
  Filled 2019-06-07 (×2): qty 1

## 2019-06-07 MED ORDER — BISACODYL 5 MG PO TBEC: 5.0000 mg | DELAYED_RELEASE_TABLET | Freq: Every day | ORAL | Status: DC | PRN

## 2019-06-07 MED ORDER — MAGNESIUM CITRATE PO SOLN: 1.0000 | Freq: Once | ORAL | Status: DC | PRN

## 2019-06-07 MED ORDER — ENOXAPARIN SODIUM 30 MG/0.3ML ~~LOC~~ SOLN: 30.0000 mg | SUBCUTANEOUS | Status: DC

## 2019-06-07 MED ORDER — VITAMIN B-1 100 MG PO TABS: 100.0000 mg | ORAL_TABLET | Freq: Every day | ORAL | Status: DC

## 2019-06-07 MED ORDER — SENNOSIDES-DOCUSATE SODIUM 8.6-50 MG PO TABS: 1.0000 | ORAL_TABLET | Freq: Every evening | ORAL | Status: DC | PRN

## 2019-06-07 MED ORDER — LORAZEPAM 2 MG/ML IJ SOLN: 0.0000 mg | Freq: Four times a day (QID) | INTRAMUSCULAR | Status: AC

## 2019-06-07 MED ORDER — LORAZEPAM 1 MG PO TABS
0.0000 mg | ORAL_TABLET | Freq: Two times a day (BID) | ORAL | Status: DC
  Administered 2019-06-10: 1 mg via ORAL
  Filled 2019-06-07: qty 1

## 2019-06-07 MED ORDER — FOLIC ACID 1 MG PO TABS
1.0000 mg | ORAL_TABLET | Freq: Every day | ORAL | Status: DC
  Administered 2019-06-08 – 2019-06-11 (×4): 1 mg via ORAL
  Filled 2019-06-07 (×4): qty 1

## 2019-06-07 MED ORDER — THIAMINE HCL 100 MG/ML IJ SOLN: 100.0000 mg | Freq: Every day | INTRAMUSCULAR | Status: DC

## 2019-06-07 MED ORDER — FOLIC ACID 1 MG PO TABS: 1.0000 mg | ORAL_TABLET | Freq: Once | ORAL | Status: DC

## 2019-06-07 MED ORDER — ONDANSETRON HCL 4 MG/2ML IJ SOLN: 4.0000 mg | Freq: Four times a day (QID) | INTRAMUSCULAR | Status: DC | PRN

## 2019-06-07 MED ORDER — ADULT MULTIVITAMIN W/MINERALS CH
1.0000 | ORAL_TABLET | Freq: Every day | ORAL | Status: DC
  Administered 2019-06-08 – 2019-06-11 (×4): 1 via ORAL
  Filled 2019-06-07 (×4): qty 1

## 2019-06-07 MED ORDER — LORAZEPAM 2 MG/ML IJ SOLN
2.0000 mg | INTRAMUSCULAR | Status: DC | PRN
  Administered 2019-06-07: 2 mg via INTRAVENOUS
  Filled 2019-06-07: qty 1

## 2019-06-07 NOTE — ED Notes (Signed)
Per Nuala Alpha, PA, no IVC needed at this time.

## 2019-06-07 NOTE — H&P (Addendum)
Trevor [name] is an 38 y.o. male.   Chief Complaint: Tremors HPI: The patient is a 38 yr old man who states that he has been drinking heavily since he lost his job last March due to COVID-19. He states that he has been drinking at least 1/2 Vodka daily. The patient presented here on 06/03/2019 and left AMA on 06/05/2019. He went home and drank heavily. He states that his last drink was this morning at 0200. He came to the ED when he began to have tremors because he states he knows that he has to stop drinking. He has admitted to suicidal ideation. He will be IVC'd by ED staff. The patient is aware that he cannot leave AMA this time.  The patient states that his only medical problem is psoriasis. He states that it gets better when he stops drinking. He states that he has no cardiac, respiratory, GI, neurological, or renal medical history. He states that he has been vomiting, but no hematemesis, coffee ground emesis, or melena. He states that he occasionally sees streaks of blood on toilet paper. No seizures, no fevers, no chills, no cough, no chest pain, no shortness of breath.  Past Medical History:  Diagnosis Date  . Elevated liver enzymes   . ETOH abuse   . 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 broken arm      Family History  Problem Relation Age of Onset  . Melanoma Mother   . Cancer Mother        skin  . Cancer Maternal Grandmother        lung  . Cancer Maternal Grandfather        lung   Social History:  reports that he has never smoked. He has quit using smokeless tobacco.  His smokeless tobacco use included snuff. He reports current alcohol use. He reports that he does not use drugs. He has been drinking at least 1/5 of vodka daily since he lost his job in 12/2018.   Allergies: No Known Allergies  Pertinent items noted in HPI and remainder of comprehensive ROS otherwise  negative.   General appearance: alert, cooperative and mild distress Head: Normocephalic, without obvious abnormality, atraumatic Eyes: conjunctivae/corneas clear. PERRL, EOM's intact. Fundi benign. Throat: lips, mucosa, and tongue normal; teeth and gums normal Neck: no adenopathy, no carotid bruit, no JVD, supple, symmetrical, trachea midline and thyroid not enlarged, symmetric, no tenderness/mass/nodules Resp: No increased work of breathing. No wheezes, rales, or rhonchi. Chest wall: no tenderness Cardio: regular rate and rhythm, S1, S2 normal, no murmur, click, rub or gallop GI: soft, non-tender; bowel sounds normal; no masses,  no organomegaly Extremities: extremities normal, atraumatic, no cyanosis or edema Pulses: 2+ and symmetric Skin: Positive for discoid areas of erythematous ans scaly skin. Remainder is warm, dry, and intact. Lymph nodes: Cervical, supraclavicular, and axillary nodes normal. Neurologic: Alert and oriented X 3, normal strength and tone. Normal symmetric reflexes. Normal coordination and gait   Results for orders placed or performed during the hospital encounter of 06/07/19 (from the past 48 hour(s))  Comprehensive metabolic panel     Status: Abnormal   Collection Time: 06/07/19  2:07 PM  Result Value Ref Range   Sodium 139 135 - 145 mmol/L   Potassium 4.0 3.5 - 5.1 mmol/L   Chloride 105 98 - 111 mmol/L   CO2 22 22 -  32 mmol/L   Glucose, Bld 116 (H) 70 - 99 mg/dL   BUN 13 6 - 20 mg/dL   Creatinine, Ser 7.820.76 0.61 - 1.24 mg/dL   Calcium 9.5 8.9 - 95.610.3 mg/dL   Total Protein 7.4 6.5 - 8.1 g/dL   Albumin 4.3 3.5 - 5.0 g/dL   AST 213113 (H) 15 - 41 U/L   ALT 210 (H) 0 - 44 U/L   Alkaline Phosphatase 54 38 - 126 U/L   Total Bilirubin 1.3 (H) 0.3 - 1.2 mg/dL   GFR calc non Af Amer >60 >60 mL/min   GFR calc Af Amer >60 >60 mL/min   Anion gap 12 5 - 15    Comment: Performed at [facility], [address] [place], [place] [address]  Ethanol     Status: None    Collection Time: 06/07/19  2:07 PM  Result Value Ref Range   Alcohol, Ethyl (B) <10 <10 mg/dL    Comment: (NOTE) Lowest detectable limit for serum alcohol is 10 mg/dL. For medical purposes only. Performed at [facility], [address] [place], [place] [address]   cbc     Status: Abnormal   Collection Time: 06/07/19  2:07 PM  Result Value Ref Range   WBC 9.9 4.0 - 10.5 K/uL   RBC 4.51 4.22 - 5.81 MIL/uL   Hemoglobin 15.9 13.0 - 17.0 g/dL   HCT 29.545.4 28.439.0 - 13.252.0 %   MCV 100.7 (H) 80.0 - 100.0 fL   MCH 35.3 (H) 26.0 - 34.0 pg   MCHC 35.0 30.0 - 36.0 g/dL   RDW 44.013.2 10.211.5 - 72.515.5 %   Platelets 257 150 - 400 K/uL   nRBC 0.0 0.0 - 0.2 %    Comment: Performed at [facility], [address] [place], [place] [address]  Salicylate level     Status: None   Collection Time: 06/07/19  2:07 PM  Result Value Ref Range   Salicylate Lvl <7.0 2.8 - 30.0 mg/dL    Comment: Performed at [facility], [address] [place], [place] [address]  Acetaminophen level     Status: Abnormal   Collection Time: 06/07/19  2:07 PM  Result Value Ref Range   Acetaminophen (Tylenol), Serum <10 (L) 10 - 30 ug/mL    Comment: (NOTE) Therapeutic concentrations vary significantly. A range of 10-30 ug/mL  may be an effective concentration for many patients. However, some  are best treated at concentrations outside of this range. Acetaminophen concentrations >150 ug/mL at 4 hours after ingestion  and >50 ug/mL at 12 hours after ingestion are often associated with  toxic reactions. Performed at [facility], [address] [place], [place] [address]   Rapid urine drug screen (hospital performed)     Status: Abnormal   Collection Time: 06/07/19  2:30 PM  Result Value Ref Range   Opiates NONE DETECTED NONE DETECTED   Cocaine NONE DETECTED NONE DETECTED   Benzodiazepines POSITIVE (A) NONE DETECTED   Amphetamines NONE DETECTED NONE DETECTED   Tetrahydrocannabinol NONE DETECTED NONE  DETECTED   Barbiturates NONE DETECTED NONE DETECTED    Comment: (NOTE) DRUG SCREEN FOR MEDICAL PURPOSES ONLY.  IF CONFIRMATION IS NEEDED FOR ANY PURPOSE, NOTIFY LAB WITHIN 5 DAYS. LOWEST DETECTABLE LIMITS FOR URINE DRUG SCREEN Drug Class                     Cutoff (ng/mL) Amphetamine and metabolites  1000 Barbiturate and metabolites    200 Benzodiazepine                 200 Tricyclics and metabolites     300 Opiates and metabolites        300 Cocaine and metabolites        300 THC                            50 Performed at [facility], [address] [place], [place] [address]    @[id number] @  Blood pressure 129/83, pulse 83, temperature 97.9 F (36.6 C), temperature source Oral, resp. rate 18, height 5\' 9"  (1.753 m), weight 99.8 kg, SpO2 100 %.    Assessment/Plan Acute alcohol withdrawal/alcohol dependence: The patient will be admitted to a telemetry bed on a CIWA protocol.   Suicidal ideation: I have discussed the patient with Harlene SaltsBrandon Morelli, PA-C. He will place IVC papers on the patient as the patient has already left AMA once on 06/05/2019. Will consult psychiatry.  Alcoholic hepatitis: Assumed. Will test for viral hepatitis and re-check acetaminophen level. Monitor transaminases.  Psoriasis: Monitor.   I have seen and evaluated this patient myself. I have spent 72 minutes in his evaluation and care.  DVT: Lovenox CODE STATUS: Full Code Family Communication: None available Disposition: Inpatient mental health facility.  Mackynzie Woolford 06/07/2019, 4:48 PM

## 2019-06-07 NOTE — BH Assessment (Signed)
Tele Assessment Note   Patient Name: Trevor [name] MRN: [id number] Referring Physician: Dr. Charlesetta Shanks, MD Location of Patient: [facility] Location of Provider: [place]  Trevor [name] is a 38 y.o. male who voluntarily came to [facility] for substance abuse treatment. Pt states "I'm trying to get off the alcohol.  I want help coping with life to avoid drinking."   Pt reports drinking 1/5 of alcohol per day for the past 5 months. Last used PTA between 12a-2a.  Pt denies any other substance use.  Pt reports a history of inpatient SA treatment at [facility] in [place] (2018) and [facility] 11/2018.  Pt reports the longest period of sobriety was 6 months. Pt denies SI/HI.  Pt reports having access to weapons. Pt states "the guns are at my cousin's house I can't get to them but I do own them".  Pt denies A/V-hallucinations.  Pt resides with his dad and step-mom. Pt report getting divorced June 2019.  Pt was married 6 years and have 1 child.  Pt reports being unemployed since 01/17/2019.  Pt denies history of outpatient MH/SA treatment. Pt denies history of physical, sexual and verbal abuse.    Patient was wearing scrubs and appeared appropriately groomed.  Pt was alert throughout the assessment.  Patient made good eye contact and had normal psychomotor activity.  Patient spoke in a normal voice without pressured speech.  Pt expressed feeling depressed.  Pt's affect appeared dysphoric and congruent with stated mood. Pt's thought process was coherent and logical.  Pt presented with good insight and judgement.  Pt did not appear to be responding to internal stimuli.  Pt was able to contract for safety.  Disposition: [facility] discussed case with [place] Provider, Earleen Newport, NP who psychiatrically cleared patient.  CSW will contact [facility], [facility], and [facility] about appropriate availability.  Information and instructions will be provided to patient on  how to follow up.    Diagnosis: F33.1    Major Depressive Disorder, Moderate                     F10.20   Alcohol Use Disorder, Severe  Past Medical History:  Past Medical History:  Diagnosis Date  . Elevated liver enzymes   . ETOH abuse   . 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 broken arm      Family History:  Family History  Problem Relation Age of Onset  . Melanoma Mother   . Cancer Mother        skin  . Cancer Maternal Grandmother        lung  . Cancer Maternal Grandfather        lung    Social History:  reports that he has never smoked. He has quit using smokeless tobacco.  His smokeless tobacco use included snuff. He reports current alcohol use. He reports that he does not use drugs.  Additional Social History:  Alcohol / Drug Use Pain Medications: See MARs Prescriptions: See MARs Over the Counter: See MARs History of alcohol / drug use?: Yes Negative Consequences of Use: Personal relationships Substance #1 Name of Substance 1: Alcohol 1 - Age of First Use: unknown 1 - Amount (size/oz): 1/5 per day 1 - Frequency: daily 1 - Duration: ongoing (past 5 months) 1 - Last Use / Amount: PTA (12a-2a)  CIWA: CIWA-Ar BP: 129/83 Pulse Rate: 83 Nausea and Vomiting: no nausea and no vomiting Tactile Disturbances: very mild itching, pins and needles, burning or numbness Tremor: six Auditory Disturbances: not present Paroxysmal Sweats: barely perceptible sweating, palms moist Visual Disturbances: not present Anxiety: mildly anxious Headache, Fullness in Head: very mild Agitation: normal activity Orientation and Clouding of Sensorium: oriented and can do serial additions CIWA-Ar Total: 10 COWS:    Allergies: No Known Allergies  Home Medications: (Not in a hospital admission)   OB/GYN Status:  No LMP for male patient.  General Assessment Data Location  of Assessment: [facility] TTS Assessment: In system Is this a Tele or Face-to-Face Assessment?: Tele Assessment Is this an Initial Assessment or a Re-assessment for this encounter?: Initial Assessment Patient Accompanied by:: N/A Language Other than English: No Living Arrangements: Other (Comment)(Dad and stepmom) What gender do you identify as?: Male Marital status: Divorced(Since June 2019) Living Arrangements: Parent(Dad and Stepmom) Can pt return to current living arrangement?: Yes Admission Status: Voluntary Is patient capable of signing voluntary admission?: Yes Referral Source: Self/Family/Friend     Crisis Care Plan Living Arrangements: Parent(Dad and Stepmom) Legal Guardian: Other:(Self) Name of Psychiatrist: None Name of Therapist: None  Education Status Is patient currently in school?: No Is the patient employed, unemployed or receiving disability?: Unemployed(January 17, 2019)  Risk to self with the past 6 months Suicidal Ideation: No Has patient been a risk to self within the past 6 months prior to admission? : No Suicidal Intent: No Has patient had any suicidal intent within the past 6 months prior to admission? : No Is patient at risk for suicide?: No Suicidal Plan?: No Has patient had any suicidal plan within the past 6 months prior to admission? : No Access to Means: No What has been your use of drugs/alcohol within the last 12 months?: Alcohol Previous Attempts/Gestures: No Triggers for Past Attempts: None known Intentional Self Injurious Behavior: None Family Suicide History: No Recent stressful life event(s): Divorce, Job Loss Persecutory voices/beliefs?: No Depression: Yes Depression Symptoms: Insomnia, Tearfulness, Isolating Substance abuse history and/or treatment for substance abuse?: Yes Suicide prevention information given to non-admitted patients: Not applicable  Risk to Others within the past 6 months Homicidal Ideation: No Does patient have any  lifetime risk of violence toward others beyond the six months prior to admission? : No Thoughts of Harm to Others: No Current Homicidal Intent: No Current Homicidal Plan: No Access to Homicidal Means: Yes Describe Access to Homicidal Means: Pt states his cousin has guns but they will be secured and do not have access to them([facility] asked pt to contact cousin to secure weapons) History of harm to others?: No Assessment of Violence: None Noted Does patient have access to weapons?: Yes (Comment)(Pt has guns at his cousin's house [facility] asked [facility] to secure) Criminal Charges Pending?: No Does patient have a court date: No Is patient on probation?: No  Psychosis Hallucinations: None noted Delusions: None noted  Mental Status Report Appearance/Hygiene: In scrubs Eye Contact: Good Motor Activity: Freedom of movement Speech: Logical/coherent Level of Consciousness: Alert Mood: Depressed Affect: Appropriate to circumstance, Depressed Anxiety Level: None Thought Processes: Coherent, Relevant Judgement: Partial Orientation: Person, Place, Appropriate for developmental age Obsessive Compulsive Thoughts/Behaviors: None  Cognitive Functioning Concentration: Normal Memory: Recent Intact, Remote Intact Is patient IDD: No Insight: Fair Impulse Control: Fair Appetite: Fair Have you had any weight changes? : No Change Sleep: Decreased Total Hours of Sleep: 5 Vegetative Symptoms: None  ADLScreening [facility] Assessment  Services) Patient's cognitive ability adequate to safely complete daily activities?: Yes Patient able to express need for assistance with ADLs?: Yes Independently performs ADLs?: Yes (appropriate for developmental age)  Prior Inpatient Therapy Prior Inpatient Therapy: Yes Prior Therapy Dates: 2018, 2020 [facility] Reason for Treatment: SA  Prior Outpatient Therapy Prior Outpatient Therapy: No Does patient have an ACCT  team?: No Does patient have Intensive In-House Services?  : No Does patient have [facility] services? : No Does patient have P4CC services?: No  ADL Screening (condition at time of admission) Patient's cognitive ability adequate to safely complete daily activities?: Yes Is the patient deaf or have difficulty hearing?: No Does the patient have difficulty seeing, even when wearing glasses/contacts?: No Does the patient have difficulty concentrating, remembering, or making decisions?: No Patient able to express need for assistance with ADLs?: Yes Does the patient have difficulty dressing or bathing?: No Independently performs ADLs?: Yes (appropriate for developmental age) Does the patient have difficulty walking or climbing stairs?: No Weakness of Legs: None Weakness of Arms/Hands: None  Home Assistive Devices/Equipment Home Assistive Devices/Equipment: None    Abuse/Neglect Assessment (Assessment to be complete while patient is alone) Abuse/Neglect Assessment Can Be Completed: Yes Physical Abuse: Denies Verbal Abuse: Denies Sexual Abuse: Denies Exploitation of patient/patient's resources: Denies Self-Neglect: Denies     Merchant navy officerAdvance Directives (For Healthcare) Does Patient Have a Medical Advance Directive?: No Would patient like information on creating a medical advance directive?: No - Patient declined Nutrition Screen- [facility] Adult/WL/AP Patient's home diet: NPO        [facility] discussed case with [facility] Provider, Assunta FoundShuvon Rankin, NP who psychiatrically cleared patient.  CSW will contact [facility], [facility], and [facility] about appropriate availability.  Information and instructions will be provided to patient on how to follow up.   Disposition Initial Assessment Completed for this Encounter: Yes Disposition of Patient: (Pending Disposition)  This service was provided via telemedicine using a 2-way, interactive audio and video technology.  Names of all persons participating in  this telemedicine service and their role in this encounter. Name: Tawni PummelWilliam R. [name] Role: Patient  Name: Tyron RussellChristel L Abid Bolla, MS [facility] Role: Triage Specialist  Name: Assunta FoundShuvon Rankin, NP Role: [facility]  Name:  Role:     Tyron RussellChristel L Merek Niu, MS, [facility], [facility] L[facility] 06/07/2019 5:54 PM

## 2019-06-07 NOTE — ED Notes (Signed)
TTS at bedside. 

## 2019-06-07 NOTE — ED Triage Notes (Signed)
Patient states drinking alcohol 5 months in a row at least a 5th vodka here for detox and asking if patient is HI denies and SI states "Mixed" cooperative with visible tremors during triage. Last drink was between 0000-2000.

## 2019-06-07 NOTE — ED Notes (Signed)
Attempted report x1. 

## 2019-06-07 NOTE — ED Provider Notes (Addendum)
[facility]   CSN: [id number] Arrival date & time: 06/07/19  1314    History   Chief Complaint Chief Complaint  Patient presents with  . Alcohol Problem  . Suicidal    HPI Trevor [name] is a 38 y.o. male presents today for alcohol withdrawal and passive suicidal ideations.  Patient reports that he recently lost his job due to COVID-19 pandemic and since that time has been drinking alcohol heavily.  He reports drinking up to 1/5 of vodka daily.  Patient was seen in the emergency department on 06/03/2019 and admitted for alcohol withdrawal, patient reports that shortly after discharge he returned to using alcohol daily.  Patient reports last drink of vodka was last night around 2 AM.  Patient is now experiencing tremors which she reports is consistent with his alcohol withdrawal.  He denies history of delirium tremens or withdrawal seizures.  Additionally patient reports passive suicidal ideations, he denies any clear plan but reports he is unsure what he is currently living for and knows that if he continues abusing alcohol that he will eventually die.  He denies any homicidal ideations.  Patient denies auditory or tactile hallucinations he does report that occasionally he will see motion in the corner of his eyes and that this is intermittent he denies any clear visual hallucinations.  Patient reports intermittent small bouts of diarrhea over the past few days.  Patient reports few episodes of nonbloody/nonbilious emesis over the past few days.  Reports he is feeling somewhat diaphoretic, and this is typical of his alcohol withdrawal.  Patient denies any recent illness, fever/chills, headache, neck pain, chest pain/shortness of breath, abdominal pain, cough, injury or congestion.     HPI  Past Medical History:  Diagnosis Date  . Elevated liver enzymes   . ETOH abuse   . HSV-2 (herpes simplex virus 2) infection    Left upper chest  wall; diagnosed by culture    Patient Active Problem List   Diagnosis Date Noted  . Alcoholic hepatitis without ascites 06/05/2019  . Alcohol withdrawal (HCC) 06/03/2019  . Anxiety state 01/09/2016  . Obesity (BMI 30-39.9) 01/09/2016  . HSV-2 (herpes simplex virus 2) infection     Past Surgical History:  Procedure Laterality Date  . broken arm          Home Medications    Prior to Admission medications   Medication Sig Start Date End Date Taking? Authorizing Provider  acyclovir (ZOVIRAX) 400 MG tablet TAKE 1 TABLET BY MOUTH THREE TIMES DAILY FOR 5 DAYS 04/24/17   Sheliah Hatchabori, Katherine E, MD  amoxicillin (AMOXIL) 875 MG tablet Take 1 tablet (875 mg total) by mouth 2 (two) times daily. Patient not taking: Reported on 06/03/2019 10/24/16   Sheliah Hatchabori, Katherine E, MD  atorvastatin (LIPITOR) 20 MG tablet Take 1 tablet (20 mg total) by mouth daily. Patient not taking: Reported on 06/03/2019 08/27/16   Sheliah Hatchabori, Katherine E, MD  buPROPion (WELLBUTRIN XL) 150 MG 24 hr tablet TAKE 1 TABLET BY MOUTH EVERY DAY Patient not taking: Reported on 06/03/2019 06/02/17   Sheliah Hatchabori, Katherine E, MD  clonazePAM [place]lette [place](KLONOPIN) 0.5 MG tablet 1 tab prior to flight as needed Patient not taking: Reported on 06/03/2019 05/07/16   Sheliah Hatchabori, Katherine E, MD  diazepam (VALIUM) 5 MG tablet Take 1 tablet (5 mg total) by mouth every 12 (twelve) hours as needed for anxiety. Patient not taking: Reported on 06/03/2019 09/03/16   Sheliah Hatchabori, Katherine E, MD  omeprazole [facility](PRILOSEC)  40 MG capsule TAKE ONE CAPSULE BY MOUTH 30MIN PRIOR TO BREAKFAST 03/05/17   Sheliah Hatchabori, Katherine E, MD  traZODone (DESYREL) 50 MG tablet TAKE 1/2 TO 1 TABLET BY MOUTH AT BEDTIME AS NEEDED FOR SLEEP 10/13/16   Sheliah Hatchabori, Katherine E, MD    Family History Family History  Problem Relation Age of Onset  . Melanoma Mother   . Cancer Mother        skin  . Cancer Maternal Grandmother        lung  . Cancer Maternal Grandfather        lung    Social History Social History    Tobacco Use  . Smoking status: Never Smoker  . Smokeless tobacco: Former NeurosurgeonUser    Types: Snuff  Substance Use Topics  . Alcohol use: Yes    Comment: fifth liquor per day  . Drug use: No     Allergies   Patient has no known allergies.   Review of Systems Review of Systems  Constitutional: Positive for diaphoresis. Negative for chills and fever.  Eyes: Positive for visual disturbance. Negative for photophobia and pain.  Gastrointestinal: Positive for diarrhea, nausea and vomiting. Negative for abdominal pain.  Musculoskeletal: Negative.  Negative for back pain and neck pain.  Skin: Negative.  Negative for wound.  Psychiatric/Behavioral: Positive for suicidal ideas. Negative for confusion and self-injury. The patient is nervous/anxious.    Ten systems are reviewed and are negative for acute change except as noted in the HPI and above ROS  Physical Exam Updated Vital Signs BP 138/86 (BP Location: Right Arm)   Pulse (!) 105   Temp 97.9 F (36.6 C) (Oral)   Resp 18   Ht 5\' 9"  (1.753 m)   Wt 99.8 kg   SpO2 97%   BMI 32.49 kg/m   Physical Exam Constitutional:      General: He is not in acute distress.    Appearance: Normal appearance. He is well-developed. He is not ill-appearing or diaphoretic.  HENT:     Head: Normocephalic and atraumatic.     Right Ear: External ear normal.     Left Ear: External ear normal.     Nose: Nose normal.  Eyes:     General: Vision grossly intact. Gaze aligned appropriately.     Extraocular Movements: Extraocular movements intact.     Conjunctiva/sclera: Conjunctivae normal.     Pupils: Pupils are equal, round, and reactive to light.     Comments: Visual fields grossly intact bilaterally  No visible nystagmus   Neck:     Musculoskeletal: Full passive range of motion without pain, normal range of motion and neck supple.     Trachea: Trachea and phonation normal. No tracheal deviation.  Cardiovascular:     Rate and Rhythm: Regular rhythm.  Tachycardia present.  Pulmonary:     Effort: Pulmonary effort is normal. No respiratory distress.  Chest:     Chest wall: No deformity, tenderness or crepitus.  Abdominal:     General: There is no distension.     Palpations: Abdomen is soft.     Tenderness: There is no abdominal tenderness. There is no guarding or rebound.     Comments: No sign of injury to the abdomen  Musculoskeletal: Normal range of motion.     Comments: No midline C/T/L spinal tenderness to palpation, no paraspinal muscle tenderness, no deformity, crepitus, or step-off noted. No sign of injury to the neck or back.  Feet:     Right foot:  Protective Sensation: 3 sites tested. 3 sites sensed.     Left foot:     Protective Sensation: 3 sites tested. 3 sites sensed.  Skin:    General: Skin is warm and dry.     Capillary Refill: Capillary refill takes less than 2 seconds.  Neurological:     Mental Status: He is alert.     GCS: GCS eye subscore is 4. GCS verbal subscore is 5. GCS motor subscore is 6.     Comments: Speech is clear and goal oriented, follows commands Major Cranial nerves without deficit, no facial droop Normal strength in upper and lower extremities bilaterally including dorsiflexion and plantar flexion, strong and equal grip strength Sensation normal to light and sharp touch Moves extremities without ataxia, coordination intact No pronator drift  Patient with tremors on examination.  Psychiatric:        Attention and Perception: He perceives visual hallucinations. He does not perceive auditory hallucinations.        Mood and Affect: Mood is anxious. Affect is not angry.        Speech: Speech normal.        Behavior: Behavior is not aggressive. Behavior is cooperative.        Thought Content: Thought content includes suicidal ideation. Thought content does not include homicidal ideation. Thought content does not include homicidal or suicidal plan.        Cognition and Memory: Cognition normal.     ED Treatments / Results  Labs (all labs ordered are listed, but only abnormal results are displayed) Labs Reviewed  COMPREHENSIVE METABOLIC PANEL - Abnormal; Notable for the following components:      Result Value   Glucose, Bld 116 (*)    AST 113 (*)    ALT 210 (*)    Total Bilirubin 1.3 (*)    All other components within normal limits  CBC - Abnormal; Notable for the following components:   MCV 100.7 (*)    MCH 35.3 (*)    All other components within normal limits  RAPID URINE DRUG SCREEN, HOSP PERFORMED - Abnormal; Notable for the following components:   Benzodiazepines POSITIVE (*)    All other components within normal limits  ACETAMINOPHEN LEVEL - Abnormal; Notable for the following components:   Acetaminophen (Tylenol), Serum <10 (*)    All other components within normal limits  ETHANOL  SALICYLATE LEVEL    EKG None  Radiology No results found.  Procedures Procedures (including critical care time)  Medications Ordered in ED Medications  LORazepam (ATIVAN) injection 0-4 mg ( Intravenous See Alternative 06/07/19 1532)    Or  LORazepam (ATIVAN) tablet 0-4 mg (2 mg Oral Given 06/07/19 1532)  LORazepam (ATIVAN) injection 0-4 mg (has no administration in time range)    Or  LORazepam (ATIVAN) tablet 0-4 mg (has no administration in time range)  thiamine (VITAMIN B-1) tablet 100 mg (has no administration in time range)    Or  thiamine (B-1) injection 100 mg (has no administration in time range)  folic acid (FOLVITE) tablet 1 mg (has no administration in time range)     Initial Impression / Assessment and Plan / ED Course  I have reviewed the triage vital signs and the nursing notes.  Pertinent labs & imaging results that were available during my care of the patient were reviewed by me and considered in my medical decision making (see chart for details).  Clinical Course as of Jun 06 1733  Tue Jun 07, 2019  46 Hospitalist   [BM]    Clinical Course User Index  [BM] Deliah Boston, PA-C   38 year old male presents today in alcohol withdrawal, somewhat agitated and anxious but cooperative, he has been experiencing nausea with intermittent vomiting and diarrhea, he reports some visual disturbances in the corners of his eye that are intermittent, he is reporting a mild headache and diaphoresis, patient with visible tremor on examination.  Additionally patient with passive suicidal ideations.  UDS positive for benzodiazepine Tylenol level negative Salicylate level negative Ethanol level negative CMP nonacute CBC nonacute Afebrile, mildly tachycardic, no hypoxia on room air, normotensive.  On initial evaluation no sign of head injury, no meningismus, mildly tachycardic without murmur, lungs clear, abdomen soft nontender, moves all 4 extremities without pain, and no neuro deficit on exam.  Patient started on CIWA protocol, folate and thiamine ordered will seek admission for alcohol withdrawal and suicidal ideations.  Tachycardia improved following Ativan.  On reassessment no acute distress states understanding of need for admission and is agreeable. - Discussed case with hospitalist service, they have asked for IVC paperwork to be filed, hospitalist to see patient for admission.  Consult called to TTS for psychiatric evaluation. - IVC paperwork in process, TTS evaluation in process. - Patient has been admitted to hospitalist service for further evaluation management.   Patient's case discussed with Dr. Johnney Killian who agrees with plan of care. ============== 8:16 PM addendum: Review of counselor Christel Dews notes show that patient has been psychiatrically cleared.  Rediscussed case with Dr. Vallery Ridge, IVC paperwork has not yet been sent, as patient is psychiatrically cleared see no reason to involuntarily commit patient at this time.  Patient being admitted to the floor for alcohol withdrawal voluntarily.  Note: Portions of this report may have been  transcribed using voice recognition software. Every effort was made to ensure accuracy; however, inadvertent computerized transcription errors may still be present. Final Clinical Impressions(s) / ED Diagnoses   Final diagnoses:  Alcohol withdrawal syndrome without complication [facility])  Passive suicidal ideations    ED Discharge Orders    None       Gari Crown 06/07/19 1740    Gari Crown 06/07/19 2019    Charlesetta Shanks, MD 06/19/19 [phone]

## 2019-06-07 NOTE — BHH Counselor (Signed)
  [place] ASSESSMENT DISPOSITION:   [facility] discussed case with [place] Provider, Earleen Newport, NP who psychiatrically cleared patient.  CSW will contact [facility], [facility], and [facility] about appropriate availability.  Information and instructions will be provided to patient on how to follow up.  Brandin Stetzer L. [place], [place], [facility], [facility] Therapeutic Triage Specialist  [phone]

## 2019-06-07 NOTE — Progress Notes (Signed)
CSW faxed information to pt on residential and detox treatment facilities at [facility] providers request.   Audree Camel, Darlyn Read Disposition [place] [facility] BHH/TTS [phone]

## 2019-06-08 DIAGNOSIS — F10231 Alcohol dependence with withdrawal delirium: Principal | ICD-10-CM

## 2019-06-08 LAB — CBC
HCT: 39.8 % (ref 39.0–52.0)
Hemoglobin: 14.5 g/dL (ref 13.0–17.0)
MCH: 35.5 pg — ABNORMAL HIGH (ref 26.0–34.0)
MCHC: 36.4 g/dL — ABNORMAL HIGH (ref 30.0–36.0)
MCV: 97.5 fL (ref 80.0–100.0)
Platelets: 212 10*3/uL (ref 150–400)
RBC: 4.08 MIL/uL — ABNORMAL LOW (ref 4.22–5.81)
RDW: 13.2 % (ref 11.5–15.5)
WBC: 6.8 10*3/uL (ref 4.0–10.5)
nRBC: 0 % (ref 0.0–0.2)

## 2019-06-08 LAB — MAGNESIUM: Magnesium: 2 mg/dL (ref 1.7–2.4)

## 2019-06-08 LAB — PROTIME-INR
INR: 1.1 (ref 0.8–1.2)
Prothrombin Time: 14.3 seconds (ref 11.4–15.2)

## 2019-06-08 LAB — COMPREHENSIVE METABOLIC PANEL
ALT: 165 U/L — ABNORMAL HIGH (ref 0–44)
AST: 78 U/L — ABNORMAL HIGH (ref 15–41)
Albumin: 3.6 g/dL (ref 3.5–5.0)
Alkaline Phosphatase: 48 U/L (ref 38–126)
Anion gap: 7 (ref 5–15)
BUN: 8 mg/dL (ref 6–20)
CO2: 25 mmol/L (ref 22–32)
Calcium: 8.8 mg/dL — ABNORMAL LOW (ref 8.9–10.3)
Chloride: 105 mmol/L (ref 98–111)
Creatinine, Ser: 0.74 mg/dL (ref 0.61–1.24)
GFR calc Af Amer: >60 mL/min (ref >60)
GFR calc non Af Amer: >60 mL/min (ref >60)
Glucose, Bld: 103 mg/dL — ABNORMAL HIGH (ref 70–99)
Potassium: 3.3 mmol/L — ABNORMAL LOW (ref 3.5–5.1)
Sodium: 137 mmol/L (ref 135–145)
Total Bilirubin: 1 mg/dL (ref 0.3–1.2)
Total Protein: 6.1 g/dL — ABNORMAL LOW (ref 6.5–8.1)

## 2019-06-08 MED ORDER — ATORVASTATIN CALCIUM 10 MG PO TABS
20.0000 mg | ORAL_TABLET | Freq: Every day | ORAL | Status: DC
  Administered 2019-06-08 – 2019-06-10 (×3): 20 mg via ORAL
  Filled 2019-06-08 (×3): qty 2

## 2019-06-08 MED ORDER — DIAZEPAM 5 MG PO TABS
5.0000 mg | ORAL_TABLET | Freq: Two times a day (BID) | ORAL | Status: DC | PRN
  Administered 2019-06-08 – 2019-06-10 (×3): 5 mg via ORAL
  Filled 2019-06-08 (×3): qty 1

## 2019-06-08 MED ORDER — POTASSIUM CHLORIDE CRYS ER 20 MEQ PO TBCR
40.0000 meq | EXTENDED_RELEASE_TABLET | Freq: Once | ORAL | Status: AC
  Administered 2019-06-08: 17:00:00 40 meq via ORAL
  Filled 2019-06-08: qty 2

## 2019-06-08 MED ORDER — ENOXAPARIN SODIUM 40 MG/0.4ML ~~LOC~~ SOLN
40.0000 mg | SUBCUTANEOUS | Status: DC
  Administered 2019-06-08 – 2019-06-10 (×3): 40 mg via SUBCUTANEOUS
  Filled 2019-06-08 (×3): qty 0.4

## 2019-06-08 MED ORDER — BUPROPION HCL ER (XL) 150 MG PO TB24
150.0000 mg | ORAL_TABLET | Freq: Every day | ORAL | Status: DC
  Administered 2019-06-09 – 2019-06-11 (×3): 150 mg via ORAL
  Filled 2019-06-08 (×4): qty 1

## 2019-06-08 NOTE — Progress Notes (Signed)
Patient stated he does not have any intentions to harm himself or others and that it was a miscommunication. Notified attending MD about this to see if safety sitter needs to be continued or if psych needs to evaluate him first.

## 2019-06-08 NOTE — Progress Notes (Signed)
PROGRESS NOTE  Trevor [name] IRW:[id number] DOB: 10-08-1981 DOA: 06/07/2019 PCP: Midge Minium, MD  Brief History    The patient is a 38 yr old man who states that he has been drinking heavily since he lost his job last March due to COVID-19. He states that he has been drinking at least 1/2 Vodka daily. The patient presented here on 06/03/2019 and left AMA on 06/05/2019. He went home and drank heavily. He states that his last drink was this morning at 0200. He came to the ED when he began to have tremors because he states he knows that he has to stop drinking. He has admitted to suicidal ideation. He will be IVC'd by ED staff. The patient is aware that he cannot leave AMA this time.  The patient states that his only medical problem is psoriasis. He states that it gets better when he stops drinking. He states that he has no cardiac, respiratory, GI, neurological, or renal medical history. He states that he has been vomiting, but no hematemesis, coffee ground emesis, or melena. He states that he occasionally sees streaks of blood on toilet paper. No seizures, no fevers, no chills, no cough, no chest pain, no shortness of breath.  [facility] were consulted to admit the patient for further evaluation and treatment.  The patient was admitted to a telemetry bed on suicide precautions and a CIWA protocol. Psychiatry was consulted to evaluate the patient for suicidality. They have cleared the patient and stated that he does no longer require suicidal precautions.  Consultants  . Psychiatry  Procedures  . None  Antibiotics   Anti-infectives (From admission, onward)   None    .  Subjective  The patient is sleeping. He has no new complaints.  Objective   Vitals:  Vitals:   06/07/19 2041 06/08/19 1138  BP: (!) 136/92 119/86  Pulse: 98 90  Resp: 18 18  Temp: 98.2 F (36.8 C) 98.1 F (36.7 C)  SpO2: 98% 96%    Exam:  Constitutional:  . The patient is sleeping soundly. He  is not awakened. He is in no acute distress. Respiratory:  . No increased work of breathing. . No wheezes, rales, or rhonchi. . No tactile fremitus Cardiovascular:  . Regular rate and rhythm. . No murmurs, ectopy, or gallups. . No lateral PMI. No thrills. Abdomen:  . Abdomen is soft, non-tender, non-distended. . No hernias, masses, or organomegaly is appreciated. . Normoactive bowel sounds.  Musculoskeletal:  . No cyanosis, clubbing, or edema Skin:  . No rashes, lesions, ulcers . palpation of skin: no induration or nodules Neurologic:  Unable to evaluate due to the patient's inability to cooperate with exam.  Psychiatric:  . Unable to evaluate due to the patient's inability to cooperate with exam.  I have personally reviewed the following:   Today's Data  . Vitals, CBC, BMP  Scheduled Meds: . enoxaparin (LOVENOX) injection  40 mg Subcutaneous Q24H  . folic acid  1 mg Oral Daily  . LORazepam  0-4 mg Intravenous Q6H   Or  . LORazepam  0-4 mg Oral Q6H  . [START ON 06/09/2019] LORazepam  0-4 mg Intravenous Q12H   Or  . [START ON 06/09/2019] LORazepam  0-4 mg Oral Q12H  . multivitamin with minerals  1 tablet Oral Daily  . thiamine  100 mg Oral Daily   Or  . thiamine  100 mg Intravenous Daily   Continuous Infusions:  Principal Problem:   Alcohol withdrawal (HCC) Active  Problems:   HSV-2 (herpes simplex virus 2) infection   Alcoholic hepatitis without ascites   Psoriasis   Suicidal ideation   Withdrawal symptoms, alcohol, with delirium (HCC)   LOS: 1 day    A & P  Acute alcohol withdrawal/alcohol dependence: The patient will be admitted to a telemetry bed on a CIWA protocol.   Suicidal ideation: I have discussed the patient with Harlene SaltsBrandon Morelli, PA-C. He will place IVC papers on the patient as the patient has already left AMA once on 06/05/2019. Will consult psychiatry.  Alcoholic hepatitis: Assumed. Will test for viral hepatitis and re-check acetaminophen level.  Monitor transaminases.  Psoriasis: Monitor.   I have seen and evaluated this patient myself. I have spent 32 minutes in his evaluation and care.  DVT: Lovenox CODE STATUS: Full Code Family Communication: None available Disposition: Inpatient mental health facility.  [name], DO [facility] Direct contact: see [email or web address]  7PM-7AM contact night coverage as above 06/08/2019, 4:16 PM  LOS: 1 day

## 2019-06-08 NOTE — Progress Notes (Signed)
Patient arrived to the unit via bed from the emergency department.  Patient is alert and oriented .  Skin assessment complete.  No skin issues. Educated the patient on how to reach the staff on the unit.  Prep the room for suicide precautions. Lowered the bed and placed the call light within reach.  Will continue to monitor the patient and notify as needed

## 2019-06-08 NOTE — Plan of Care (Signed)
  Problem: Health Behavior/Discharge Planning: Goal: Ability to identify changes in lifestyle to reduce recurrence of condition will improve Outcome: Progressing   

## 2019-06-09 NOTE — Progress Notes (Signed)
PROGRESS NOTE  Fernande BoydenWilliam R Szymborski ZOX:[id number] DOB: 08/10/1981 DOA: 06/07/2019 PCP: Sheliah Hatchabori, Katherine E, MD  Brief History    The patient is a 38 yr old man who states that he has been drinking heavily since he lost his job last March due to COVID-19. He states that he has been drinking at least 1/2 Vodka daily. The patient presented here on 06/03/2019 and left AMA on 06/05/2019. He went home and drank heavily. He states that his last drink was this morning at 0200. He came to the ED when he began to have tremors because he states he knows that he has to stop drinking. He has admitted to suicidal ideation. He will be IVC'd by ED staff. The patient is aware that he cannot leave AMA this time.  The patient states that his only medical problem is psoriasis. He states that it gets better when he stops drinking. He states that he has no cardiac, respiratory, GI, neurological, or renal medical history. He states that he has been vomiting, but no hematemesis, coffee ground emesis, or melena. He states that he occasionally sees streaks of blood on toilet paper. No seizures, no fevers, no chills, no cough, no chest pain, no shortness of breath.  [facility] were consulted to admit the patient for further evaluation and treatment.  The patient was admitted to a telemetry bed on suicide precautions and a CIWA protocol. Psychiatry was consulted to evaluate the patient for suicidality. They have cleared the patient and stated that he does no longer require suicidal precautions.  Consultants  . Psychiatry  Procedures  . None  Antibiotics   Anti-infectives (From admission, onward)   None     Subjective  The patient is resting comfortably. No new complaints.  Objective   Vitals:  Vitals:   06/09/19 0510 06/09/19 1200  BP: 118/75   Pulse: 91 90  Resp: 16   Temp: 97.9 F (36.6 C)   SpO2: 97%     Exam:  Constitutional:  . The patient is awake, alert, and oriented x 3. He is in no acute  distress. He appears calm, but slightly tremulous. Respiratory:  . No increased work of breathing. . No wheezes, rales, or rhonchi. . No tactile fremitus Cardiovascular:  . Regular rate and rhythm. . No murmurs, ectopy, or gallups. . No lateral PMI. No thrills. Abdomen:  . Abdomen is soft, non-tender, non-distended. . No hernias, masses, or organomegaly is appreciated. . Normoactive bowel sounds.  Musculoskeletal:  . No cyanosis, clubbing, or edema Skin:  . No rashes, lesions, ulcers . palpation of skin: no induration or nodules Neurologic:  Unable to evaluate due to the patient's inability to cooperate with exam.  Psychiatric:  . Unable to evaluate due to the patient's inability to cooperate with exam.  I have personally reviewed the following:   Today's Data  . Vitals, CBC, BMP  Scheduled Meds: . atorvastatin  20 mg Oral q1800  . buPROPion  150 mg Oral Daily  . enoxaparin (LOVENOX) injection  40 mg Subcutaneous Q24H  . folic acid  1 mg Oral Daily  . LORazepam  0-4 mg Intravenous Q12H   Or  . LORazepam  0-4 mg Oral Q12H  . multivitamin with minerals  1 tablet Oral Daily  . thiamine  100 mg Oral Daily   Or  . thiamine  100 mg Intravenous Daily   Continuous Infusions:  Principal Problem:   Alcohol withdrawal (HCC) Active Problems:   HSV-2 (herpes simplex virus 2) infection  Alcoholic hepatitis without ascites   Psoriasis   Suicidal ideation   Withdrawal symptoms, alcohol, with delirium ([place])   LOS: 2 days    A & P  Acute alcohol withdrawal/alcohol dependence: The patient will be admitted to a telemetry bed on a CIWA protocol.   Suicidal ideation: I have discussed the patient with Nuala Alpha, PA-C. He will place IVC papers on the patient as the patient has already left AMA once on 06/05/2019. He has been evaluated by psychiatry and cleared from a suicidal standpoint. Suicidal precautions have been discontinued.  Alcoholic hepatitis: Assumed. Will test  for viral hepatitis and re-check acetaminophen level. Monitor transaminases.  Psoriasis: Monitor.   I have seen and evaluated this patient myself. I have spent 33 minutes in his evaluation and care.  DVT: Lovenox CODE STATUS: Full Code Family Communication: None available Disposition: Inpatient mental health facility.  Zohair Epp, DO [facility] Direct contact: see [email or web address]  7PM-7AM contact night coverage as above 06/09/2019, 3:31 PM  LOS: 1 day

## 2019-06-10 ENCOUNTER — Encounter (HOSPITAL_COMMUNITY): Payer: Self-pay | Admitting: General Practice

## 2019-06-10 NOTE — Progress Notes (Signed)
PROGRESS NOTE  Trevor [name] JME:[id number] DOB: June 08, 1981 DOA: 06/07/2019 PCP: Midge Minium, MD   LOS: 3 days   Brief Narrative / Interim history: 38 year old male who is a heavy drinker, especially since he lost his job in March due to COVID-19, presents to the hospital and get admitted on 06/07/2019 for alcohol withdrawals.  He initially presented on 8/7 but left AMA on 8/9.  His last drink was on 8/11 at 2 AM.  Psychiatry evaluated patient while he is in the ED and he was cleared, not requiring suicidal precautions or any other inpatient psychiatric needs  Subjective: He is having difficulties falling asleep, still some mild tremors this morning but overall feels improved.  Assessment & Plan: Principal Problem:   Alcohol withdrawal (HCC) Active Problems:   HSV-2 (herpes simplex virus 2) infection   Alcoholic hepatitis without ascites   Psoriasis   Suicidal ideation   Withdrawal symptoms, alcohol, with delirium (HCC)   Principal Problem Acute alcohol withdrawal/alcohol dependence -Continue CIWA, triggering but not severe.  No signs of DT as of now, he still within the 72-96 hour high risk -Continue to monitor, if there are no complications will be discharged home tomorrow.  He tells me he will live with his dad so that he stays away from alcohol up until he can get checked into rehab facility  Active Problems Alcoholic hepatitis -LFTs improving with alcohol cessation  Psoriasis -Outpatient management     Scheduled Meds: . atorvastatin  20 mg Oral q1800  . buPROPion  150 mg Oral Daily  . enoxaparin (LOVENOX) injection  40 mg Subcutaneous Q24H  . folic acid  1 mg Oral Daily  . LORazepam  0-4 mg Intravenous Q12H   Or  . LORazepam  0-4 mg Oral Q12H  . multivitamin with minerals  1 tablet Oral Daily  . thiamine  100 mg Oral Daily   Or  . thiamine  100 mg Intravenous Daily   Continuous Infusions: PRN Meds:.bisacodyl, diazepam, LORazepam, magnesium citrate,  ondansetron **OR** ondansetron (ZOFRAN) IV, senna-docusate  DVT prophylaxis: Lovenox Code Status: Full code Family Communication: d/w patient  Disposition Plan: home tomorrow if stable  Consultants:   None   Procedures:   None   Antimicrobials:  None    Objective: Vitals:   06/09/19 1600 06/09/19 2121 06/10/19 0545 06/10/19 1311  BP:  123/84 109/67 117/80  Pulse: 77 92 78 89  Resp:  16 16 17   Temp:  98.4 F (36.9 C) 97.7 F (36.5 C) 98.3 F (36.8 C)  TempSrc:  Oral Oral Oral  SpO2:  96% 98% 97%  Weight:      Height:        Intake/Output Summary (Last 24 hours) at 06/10/2019 1336 Last data filed at 06/10/2019 1300 Gross per 24 hour  Intake 837 ml  Output -  Net 837 ml   Filed Weights   06/07/19 1345  Weight: 99.8 kg    Examination:  Constitutional: NAD Eyes: PERRL, lids and conjunctivae normal ENMT: Mucous membranes are moist. No oropharyngeal exudates Neck: normal, supple Respiratory: clear to auscultation bilaterally, no wheezing, no crackles. Normal respiratory effort. No accessory muscle use.  Cardiovascular: Regular rate and rhythm, no murmurs / rubs / gallops. No LE edema. 2+ pedal pulses. No carotid bruits.  Abdomen: no tenderness. Bowel sounds positive.  Musculoskeletal: no clubbing / cyanosis. No joint deformity upper and lower extremities. No contractures. Normal muscle tone.  Skin: no rashes, lesions, ulcers. No induration Neurologic: CN 2-12 grossly  intact. Strength 5/5 in all 4.  Slightly tremulous Psychiatric: Normal judgment and insight. Alert and oriented x 3. Normal mood.    Data Reviewed: I have independently reviewed following labs and imaging studies   CBC: Recent Labs  Lab 06/03/19 1709 06/04/19 0144 06/07/19 1407 06/08/19 0158  WBC 7.1 5.3 9.9 6.8  HGB 16.5 14.5 15.9 14.5  HCT 45.8 41.6 45.4 39.8  MCV 97.7 97.9 100.7* 97.5  PLT 254 230 257 212   Basic Metabolic Panel: Recent Labs  Lab 06/03/19 1709 06/04/19 0144  06/05/19 0901 06/07/19 1407 06/08/19 0158 06/08/19 1736  NA  --  137 138 139 137  --   K  --  3.2* 3.1* 4.0 3.3*  --   CL  --  100 103 105 105  --   CO2  --  24 24 22 25   --   GLUCOSE  --  87 137* 116* 103*  --   BUN  --  7 6 13 8   --   CREATININE 0.69 0.79 0.67 0.76 0.74  --   CALCIUM  --  8.9 8.8* 9.5 8.8*  --   MG  --   --   --   --   --  2.0   GFR: Estimated Creatinine Clearance: 145.7 mL/min (by C-G formula based on SCr of 0.74 mg/dL). Liver Function Tests: Recent Labs  Lab 06/04/19 0144 06/07/19 1407 06/08/19 0158  AST 80* 113* 78*  ALT 138* 210* 165*  ALKPHOS 44 54 48  BILITOT 1.1 1.3* 1.0  PROT 6.4* 7.4 6.1*  ALBUMIN 3.7 4.3 3.6   No results for input(s): LIPASE, AMYLASE in the last 168 hours. No results for input(s): AMMONIA in the last 168 hours. Coagulation Profile: Recent Labs  Lab 06/08/19 0158  INR 1.1   Cardiac Enzymes: No results for input(s): CKTOTAL, CKMB, CKMBINDEX, TROPONINI in the last 168 hours. BNP (last 3 results) No results for input(s): PROBNP in the last 8760 hours. HbA1C: No results for input(s): HGBA1C in the last 72 hours. CBG: No results for input(s): GLUCAP in the last 168 hours. Lipid Profile: No results for input(s): CHOL, HDL, LDLCALC, TRIG, CHOLHDL, LDLDIRECT in the last 72 hours. Thyroid Function Tests: No results for input(s): TSH, T4TOTAL, FREET4, T3FREE, THYROIDAB in the last 72 hours. Anemia Panel: No results for input(s): VITAMINB12, FOLATE, FERRITIN, TIBC, IRON, RETICCTPCT in the last 72 hours. Urine analysis:    Component Value Date/Time   BILIRUBINUR negative 08/25/2015 1008   KETONESUR negative 08/25/2015 1008   PROTEINUR negative 08/25/2015 1008   UROBILINOGEN 0.2 08/25/2015 1008   NITRITE Negative 08/25/2015 1008   LEUKOCYTESUR Negative 08/25/2015 1008   Sepsis Labs: Invalid input(s): PROCALCITONIN, LACTICIDVEN  Recent Results (from the past 240 hour(s))  SARS CORONAVIRUS 2 Nasal Swab Aptima Multi Swab      Status: None   Collection Time: 06/03/19 12:12 PM   Specimen: Aptima Multi Swab; Nasal Swab  Result Value Ref Range Status   SARS Coronavirus 2 NEGATIVE NEGATIVE Final    Comment: (NOTE) SARS-CoV-2 target nucleic acids are NOT DETECTED. The SARS-CoV-2 RNA is generally detectable in upper and lower respiratory specimens during the acute phase of infection. Negative results do not preclude SARS-CoV-2 infection, do not rule out co-infections with other pathogens, and should not be used as the sole basis for treatment or other patient management decisions. Negative results must be combined with clinical observations, patient history, and epidemiological information. The expected result is Negative. Fact Sheet for Patients:  HairSlick.nohttps://www.fda.gov/media/138098/download Fact Sheet for Healthcare Providers: quierodirigir.comhttps://www.fda.gov/media/138095/download This test is not yet approved or cleared by the [place] FDA and  has been authorized for detection and/or diagnosis of SARS-CoV-2 by FDA under an Emergency Use Authorization (EUA). This EUA will remain  in effect (meaning this test can be used) for the duration of the COVID-19 declaration under Section 56 4(b)(1) of the Act, 21 U.S.C. section 360bbb-3(b)(1), unless the authorization is terminated or revoked sooner. Performed at [facility], [address] [place], [place] [address]   MRSA PCR Screening     Status: None   Collection Time: 06/04/19 10:23 AM   Specimen: Nasopharyngeal  Result Value Ref Range Status   MRSA by PCR NEGATIVE NEGATIVE Final    Comment:        The GeneXpert MRSA Assay (FDA approved for NASAL specimens only), is one component of a comprehensive MRSA colonization surveillance program. It is not intended to diagnose MRSA infection nor to guide or monitor treatment for MRSA infections. Performed at [facility], [address] [place], [place] [address]   SARS CORONAVIRUS 2 Nasal Swab Aptima  Multi Swab     Status: None   Collection Time: 06/07/19  3:36 PM   Specimen: Aptima Multi Swab; Nasal Swab  Result Value Ref Range Status   SARS Coronavirus 2 NEGATIVE NEGATIVE Final    Comment: (NOTE) SARS-CoV-2 target nucleic acids are NOT DETECTED. The SARS-CoV-2 RNA is generally detectable in upper and lower respiratory specimens during the acute phase of infection. Negative results do not preclude SARS-CoV-2 infection, do not rule out co-infections with other pathogens, and should not be used as the sole basis for treatment or other patient management decisions. Negative results must be combined with clinical observations, patient history, and epidemiological information. The expected result is Negative. Fact Sheet for Patients: HairSlick.nohttps://www.fda.gov/media/138098/download Fact Sheet for Healthcare Providers: quierodirigir.comhttps://www.fda.gov/media/138095/download This test is not yet approved or cleared by the [place] FDA and  has been authorized for detection and/or diagnosis of SARS-CoV-2 by FDA under an Emergency Use Authorization (EUA). This EUA will remain  in effect (meaning this test can be used) for the duration of the COVID-19 declaration under Section 56 4(b)(1) of the Act, 21 U.S.C. section 360bbb-3(b)(1), unless the authorization is terminated or revoked sooner. Performed at [facility], [address] [place], [place] [address]       Radiology Studies: No results found.  Pamella Pertostin Katina Remick, MD, PhD [facility]  Contact via  [email or web address]  [facility] Office Info P: [phone] F: [phone]

## 2019-06-11 LAB — COMPREHENSIVE METABOLIC PANEL
ALT: 169 U/L — ABNORMAL HIGH (ref 0–44)
AST: 76 U/L — ABNORMAL HIGH (ref 15–41)
Albumin: 3.8 g/dL (ref 3.5–5.0)
Alkaline Phosphatase: 46 U/L (ref 38–126)
Anion gap: 9 (ref 5–15)
BUN: 10 mg/dL (ref 6–20)
CO2: 24 mmol/L (ref 22–32)
Calcium: 9 mg/dL (ref 8.9–10.3)
Chloride: 104 mmol/L (ref 98–111)
Creatinine, Ser: 0.79 mg/dL (ref 0.61–1.24)
GFR calc Af Amer: >60 mL/min (ref >60)
GFR calc non Af Amer: >60 mL/min (ref >60)
Glucose, Bld: 98 mg/dL (ref 70–99)
Potassium: 4 mmol/L (ref 3.5–5.1)
Sodium: 137 mmol/L (ref 135–145)
Total Bilirubin: 0.9 mg/dL (ref 0.3–1.2)
Total Protein: 6.6 g/dL (ref 6.5–8.1)

## 2019-06-11 LAB — CBC
HCT: 42.2 % (ref 39.0–52.0)
Hemoglobin: 15.1 g/dL (ref 13.0–17.0)
MCH: 35.3 pg — ABNORMAL HIGH (ref 26.0–34.0)
MCHC: 35.8 g/dL (ref 30.0–36.0)
MCV: 98.6 fL (ref 80.0–100.0)
Platelets: 287 10*3/uL (ref 150–400)
RBC: 4.28 MIL/uL (ref 4.22–5.81)
RDW: 12.8 % (ref 11.5–15.5)
WBC: 6.8 10*3/uL (ref 4.0–10.5)
nRBC: 0 % (ref 0.0–0.2)

## 2019-06-11 MED ORDER — BUPROPION HCL ER (XL) 150 MG PO TB24: 150.0000 mg | ORAL_TABLET | Freq: Every day | ORAL | 0 refills | Status: DC

## 2019-06-11 MED ORDER — TRAZODONE HCL 50 MG PO TABS: 25.0000 mg | ORAL_TABLET | Freq: Every evening | ORAL | 0 refills | Status: DC | PRN

## 2019-06-11 MED ORDER — CHLORDIAZEPOXIDE HCL 5 MG PO CAPS: 5.0000 mg | ORAL_CAPSULE | Freq: Three times a day (TID) | ORAL | 0 refills | Status: DC | PRN

## 2019-06-11 NOTE — Discharge Instructions (Signed)
Follow with Midge Minium, MD in 5-7 days  Please get a complete blood count and chemistry panel checked by your Primary MD at your next visit, and again as instructed by your Primary MD. Please get your medications reviewed and adjusted by your Primary MD.  Please request your Primary MD to go over all Hospital Tests and Procedure/Radiological results at the follow up, please get all Hospital records sent to your Prim MD by signing hospital release before you go home.  In some cases, there will be blood work, cultures and biopsy results pending at the time of your discharge. Please request that your primary care M.D. goes through all the records of your hospital data and follows up on these results.  If you had Pneumonia of Lung problems at the Hospital: Please get a 2 view Chest X ray done in 6-8 weeks after hospital discharge or sooner if instructed by your Primary MD.  If you have Congestive Heart Failure: Please call your Cardiologist or Primary MD anytime you have any of the following symptoms:  1) 3 pound weight gain in 24 hours or 5 pounds in 1 week  2) shortness of breath, with or without a dry hacking cough  3) swelling in the hands, feet or stomach  4) if you have to sleep on extra pillows at night in order to breathe  Follow cardiac low salt diet and 1.5 lit/day fluid restriction.  If you have diabetes Accuchecks 4 times/day, Once in AM empty stomach and then before each meal. Log in all results and show them to your primary doctor at your next visit. If any glucose reading is under 80 or above 300 call your primary MD immediately.  If you have Seizure/Convulsions/Epilepsy: Please do not drive, operate heavy machinery, participate in activities at heights or participate in high speed sports until you have seen by Primary MD or a Neurologist and advised to do so again. Per [facility] statutes, patients with seizures are not allowed to drive until they have been  seizure-free for six months.  Use caution when using heavy equipment or power tools. Avoid working on ladders or at heights. Take showers instead of baths. Ensure the water temperature is not too high on the home water heater. Do not go swimming alone. Do not lock yourself in a room alone (i.e. bathroom). When caring for infants or small children, sit down when holding, feeding, or changing them to minimize risk of injury to the child in the event you have a seizure. Maintain good sleep hygiene. Avoid alcohol.   If you had Gastrointestinal Bleeding: Please ask your Primary MD to check a complete blood count within one week of discharge or at your next visit. Your endoscopic/colonoscopic biopsies that are pending at the time of discharge, will also need to followed by your Primary MD.  Get Medicines reviewed and adjusted. Please take all your medications with you for your next visit with your Primary MD  Please request your Primary MD to go over all hospital tests and procedure/radiological results at the follow up, please ask your Primary MD to get all Hospital records sent to his/her office.  If you experience worsening of your admission symptoms, develop shortness of breath, life threatening emergency, suicidal or homicidal thoughts you must seek medical attention immediately by calling 911 or calling your MD immediately  if symptoms less severe.  You must read complete instructions/literature along with all the possible adverse reactions/side effects for all the Medicines you  take and that have been prescribed to you. Take any new Medicines after you have completely understood and accpet all the possible adverse reactions/side effects.   Do not drive or operate heavy machinery when taking Pain medications.   Do not take more than prescribed Pain, Sleep and Anxiety Medications  Special Instructions: If you have smoked or chewed Tobacco  in the last 2 yrs please stop smoking, stop any regular  Alcohol  and or any Recreational drug use.  Wear Seat belts while driving.  Please note You were cared for by a hospitalist during your hospital stay. If you have any questions about your discharge medications or the care you received while you were in the hospital after you are discharged, you can call the unit and asked to speak with the hospitalist on call if the hospitalist that took care of you is not available. Once you are discharged, your primary care physician will handle any further medical issues. Please note that NO REFILLS for any discharge medications will be authorized once you are discharged, as it is imperative that you return to your primary care physician (or establish a relationship with a primary care physician if you do not have one) for your aftercare needs so that they can reassess your need for medications and monitor your lab values.  You can reach the hospitalist office at phone [phone] or fax [phone]   If you do not have a primary care physician, you can call [phone] for a physician referral.  Activity: As tolerated with Full fall precautions use walker/cane & assistance as needed    Diet: regular  Disposition Home

## 2019-06-11 NOTE — Discharge Summary (Signed)
Physician Discharge Summary  Trevor [name] ZHY:[id number] DOB: [birth year] DOA: 06/07/2019  PCP: Sheliah Hatchabori, Katherine E, MD  Admit date: 06/07/2019 Discharge date: 06/11/2019  Admitted From: home Disposition:  home  Recommendations for Outpatient Follow-up:  1. Follow up with PCP in 1-2 weeks  Home Health: none  Equipment/Devices: none  Discharge Condition: stable CODE STATUS: Full code Diet recommendation: regular  HPI: Per admitting MD, The patient is a 38 yr old man who states that he has been drinking heavily since he lost his job last March due to COVID-19. He states that he has been drinking at least 1/2 Vodka daily. The patient presented here on 06/03/2019 and left AMA on 06/05/2019. He went home and drank heavily. He states that his last drink was this morning at 0200. He came to the ED when he began to have tremors because he states he knows that he has to stop drinking. He has admitted to suicidal ideation. He will be IVC'd by ED staff. The patient is aware that he cannot leave AMA this time. The patient states that his only medical problem is psoriasis. He states that it gets better when he stops drinking. He states that he has no cardiac, respiratory, GI, neurological, or renal medical history. He states that he has been vomiting, but no hematemesis, coffee ground emesis, or melena. He states that he occasionally sees streaks of blood on toilet paper. No seizures, no fevers, no chills, no cough, no chest pain, no shortness of breath.  Hospital Course: Principal Problem Acute alcohol withdrawal/alcohol dependence -patient was admitted to the hospital with alcohol withdrawal.  Psychiatry has evaluated patient while he was in the emergency room and was cleared without requiring inpatient psych or any other acute psychiatric needs.  He remained stable, his CIWA gradually improved and no longer triggering, he had no signs of DTs, he is back to baseline and will be discharged home in  stable condition.  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, multimediadelivery driver  Active Problems Alcoholic hepatitis -LFTs improving with alcohol cessation Psoriasis -Outpatient management   Discharge Diagnoses:  Principal Problem:   Alcohol withdrawal (HCC) Active Problems:   HSV-2 (herpes simplex virus 2) infection   Alcoholic hepatitis without ascites   Psoriasis   Suicidal ideation   Withdrawal symptoms, alcohol, with delirium (HCC)     Discharge Instructions   Allergies as of 06/11/2019   No Known Allergies     Medication List    STOP taking these medications   amoxicillin 875 MG tablet Commonly known as: AMOXIL   clonazePAM 0.5 MG tablet Commonly known as: KlonoPIN   diazepam 5 MG tablet Commonly known as: VALIUM     TAKE these medications   acyclovir 400 MG tablet Commonly known as: ZOVIRAX TAKE 1 TABLET BY MOUTH THREE TIMES DAILY FOR 5 DAYS What changed:   how much to take  how to take this  when to take this  additional instructions   atorvastatin 20 MG tablet Commonly known as: LIPITOR Take 1 tablet (20 mg total) by mouth daily.   buPROPion 150 MG 24 hr tablet Commonly known as: WELLBUTRIN XL Take 1 tablet (150 mg total) by mouth daily.   chlordiazePOXIDE 5 MG capsule Commonly known as: LIBRIUM Take 1 capsule (5 mg total) by mouth 3 (three) times daily as needed for anxiety.   ibuprofen 200 MG tablet Commonly known  as: ADVIL Take 200-400 mg by mouth every 6 (six) hours as needed (for headaches or pain).   omeprazole 40 MG capsule Commonly known as: PRILOSEC TAKE ONE CAPSULE BY MOUTH 30MIN PRIOR TO BREAKFAST   traZODone 50 MG tablet Commonly known as: DESYREL Take 0.5-1 tablets (25-50 mg total) by mouth at bedtime as needed. for sleep What changed:   reasons to take this  additional instructions      Follow-up Information    Services, [facility] Follow  up.   Contact information: [place] [address] [phone]        Services, [facility] Follow up.   Specialty: [facility] information: [address] [place] [address] [phone]        [place]. Go on 06/09/2019.   Contact information: [address] [place] [id number] [phone]          Consultations:  None   Procedures/Studies:   No results found.   Subjective: - no chest pain, shortness of breath, no abdominal pain, nausea or vomiting.   Discharge Exam: BP (!) 88/64 (BP Location: Left Arm)   Pulse 71   Temp 98.1 F (36.7 C) (Oral)   Resp 16   Ht 5\' 9"  (1.753 m)   Wt 99.8 kg   SpO2 97%   BMI 32.49 kg/m   General: Pt is alert, awake, not in acute distress Cardiovascular: RRR, S1/S2 +, no rubs, no gallops Respiratory: CTA bilaterally, no wheezing, no rhonchi Abdominal: Soft, NT, ND, bowel sounds + Extremities: no edema, no cyanosis    The results of significant diagnostics from this hospitalization (including imaging, microbiology, ancillary and laboratory) are listed below for reference.     Microbiology: Recent Results (from the past 240 hour(s))  SARS CORONAVIRUS 2 Nasal Swab Aptima Multi Swab     Status: None   Collection Time: 06/03/19 12:12 PM   Specimen: Aptima Multi Swab; Nasal Swab  Result Value Ref Range Status   SARS Coronavirus 2 NEGATIVE NEGATIVE Final    Comment: (NOTE) SARS-CoV-2 target nucleic acids are NOT DETECTED. The SARS-CoV-2 RNA is generally detectable in upper and lower respiratory specimens during the acute phase of infection. Negative results do not preclude SARS-CoV-2 infection, do not rule out co-infections with other pathogens, and should not be used as the sole basis for treatment or other patient management decisions. Negative results must be combined with clinical observations, patient  history, and epidemiological information. The expected result is Negative. Fact Sheet for Patients: SugarRoll.be Fact Sheet for Healthcare Providers: https://www.woods-mathews.com/ This test is not yet approved or cleared by the [place] FDA and  has been authorized for detection and/or diagnosis of SARS-CoV-2 by FDA under an Emergency Use Authorization (EUA). This EUA will remain  in effect (meaning this test can be used) for the duration of the COVID-19 declaration under Section 56 4(b)(1) of the Act, 21 U.S.C. section 360bbb-3(b)(1), unless the authorization is terminated or revoked sooner. Performed at [facility], [place] [address], [place], [place] [address]   MRSA PCR Screening     Status: None   Collection Time: 06/04/19 10:23 AM   Specimen: Nasopharyngeal  Result Value Ref Range Status   MRSA by PCR NEGATIVE NEGATIVE Final    Comment:        The GeneXpert MRSA Assay (FDA approved for NASAL specimens only), is one component of a comprehensive MRSA colonization surveillance  program. It is not intended to diagnose MRSA infection nor to guide or monitor treatment for MRSA infections. Performed at [facility], [address] [place], [place] [address]   SARS CORONAVIRUS 2 Nasal Swab Aptima Multi Swab     Status: None   Collection Time: 06/07/19  3:36 PM   Specimen: Aptima Multi Swab; Nasal Swab  Result Value Ref Range Status   SARS Coronavirus 2 NEGATIVE NEGATIVE Final    Comment: (NOTE) SARS-CoV-2 target nucleic acids are NOT DETECTED. The SARS-CoV-2 RNA is generally detectable in upper and lower respiratory specimens during the acute phase of infection. Negative results do not preclude SARS-CoV-2 infection, do not rule out co-infections with other pathogens, and should not be used as the sole basis for treatment or other patient management decisions. Negative results must be combined with clinical  observations, patient history, and epidemiological information. The expected result is Negative. Fact Sheet for Patients: HairSlick.nohttps://www.fda.gov/media/138098/download Fact Sheet for Healthcare Providers: quierodirigir.comhttps://www.fda.gov/media/138095/download This test is not yet approved or cleared by the [place] FDA and  has been authorized for detection and/or diagnosis of SARS-CoV-2 by FDA under an Emergency Use Authorization (EUA). This EUA will remain  in effect (meaning this test can be used) for the duration of the COVID-19 declaration under Section 56 4(b)(1) of the Act, 21 U.S.C. section 360bbb-3(b)(1), unless the authorization is terminated or revoked sooner. Performed at [facility], [address] [place], [place] [address]      Labs: BNP (last 3 results) No results for input(s): BNP in the last 8760 hours. Basic Metabolic Panel: Recent Labs  Lab 06/05/19 0901 06/07/19 1407 06/08/19 0158 06/08/19 1736 06/11/19 0252  NA 138 139 137  --  137  K 3.1* 4.0 3.3*  --  4.0  CL 103 105 105  --  104  CO2 24 22 25   --  24  GLUCOSE 137* 116* 103*  --  98  BUN 6 13 8   --  10  CREATININE 0.67 0.76 0.74  --  0.79  CALCIUM 8.8* 9.5 8.8*  --  9.0  MG  --   --   --  2.0  --    Liver Function Tests: Recent Labs  Lab 06/07/19 1407 06/08/19 0158 06/11/19 0252  AST 113* 78* 76*  ALT 210* 165* 169*  ALKPHOS 54 48 46  BILITOT 1.3* 1.0 0.9  PROT 7.4 6.1* 6.6  ALBUMIN 4.3 3.6 3.8   No results for input(s): LIPASE, AMYLASE in the last 168 hours. No results for input(s): AMMONIA in the last 168 hours. CBC: Recent Labs  Lab 06/07/19 1407 06/08/19 0158 06/11/19 0252  WBC 9.9 6.8 6.8  HGB 15.9 14.5 15.1  HCT 45.4 39.8 42.2  MCV 100.7* 97.5 98.6  PLT 257 212 287   Cardiac Enzymes: No results for input(s): CKTOTAL, CKMB, CKMBINDEX, TROPONINI in the last 168 hours. BNP: Invalid input(s): POCBNP CBG: No results for input(s): GLUCAP in the last 168 hours. D-Dimer No  results for input(s): DDIMER in the last 72 hours. Hgb A1c No results for input(s): HGBA1C in the last 72 hours. Lipid Profile No results for input(s): CHOL, HDL, LDLCALC, TRIG, CHOLHDL, LDLDIRECT in the last 72 hours. Thyroid function studies No results for input(s): TSH, T4TOTAL, T3FREE, THYROIDAB in the last 72 hours.  Invalid input(s): FREET3 Anemia work up No results for input(s): VITAMINB12, FOLATE, FERRITIN, TIBC, IRON, RETICCTPCT in the last 72 hours. Urinalysis    Component Value Date/Time  BILIRUBINUR negative 08/25/2015 1008   KETONESUR negative 08/25/2015 1008   PROTEINUR negative 08/25/2015 1008   UROBILINOGEN 0.2 08/25/2015 1008   NITRITE Negative 08/25/2015 1008   LEUKOCYTESUR Negative 08/25/2015 1008   Sepsis Labs Invalid input(s): PROCALCITONIN,  WBC,  LACTICIDVEN  FURTHER DISCHARGE INSTRUCTIONS:   Get Medicines reviewed and adjusted: Please take all your medications with you for your next visit with your Primary MD   Laboratory/radiological data: Please request your Primary MD to go over all hospital tests and procedure/radiological results at the follow up, please ask your Primary MD to get all Hospital records sent to his/her office.   In some cases, they will be blood work, cultures and biopsy results pending at the time of your discharge. Please request that your primary care M.D. goes through all the records of your hospital data and follows up on these results.   Also Note the following: If you experience worsening of your admission symptoms, develop shortness of breath, life threatening emergency, suicidal or homicidal thoughts you must seek medical attention immediately by calling 911 or calling your MD immediately  if symptoms less severe.   You must read complete instructions/literature along with all the possible adverse reactions/side effects for all the Medicines you take and that have been prescribed to you. Take any new Medicines after you have  completely understood and accpet all the possible adverse reactions/side effects.    Do not drive when taking Pain medications or sleeping medications (Benzodaizepines)   Do not take more than prescribed Pain, Sleep and Anxiety Medications. It is not advisable to combine anxiety,sleep and pain medications without talking with your primary care practitioner   Special Instructions: If you have smoked or chewed Tobacco  in the last 2 yrs please stop smoking, stop any regular Alcohol  and or any Recreational drug use.   Wear Seat belts while driving.   Please note: You were cared for by a hospitalist during your hospital stay. Once you are discharged, your primary care physician will handle any further medical issues. Please note that NO REFILLS for any discharge medications will be authorized once you are discharged, as it is imperative that you return to your primary care physician (or establish a relationship with a primary care physician if you do not have one) for your post hospital discharge needs so that they can reassess your need for medications and monitor your lab values.  Time coordinating discharge: 20 minutes  SIGNED:  Pamella Pertostin Thoams Siefert, MD, PhD 06/11/2019, 2:29 PM

## 2019-06-11 NOTE — Progress Notes (Signed)
Pt discharged to home. IV and telemetry removed. Pt in stable condition. Pt given AVS and paper scripts for Wellbutrin, Librium, and Trazadone. I discussed need for follow up with patient, pt taught back about following up with his PCP and with alcohol cessation resources. Pt calling his ride.

## 2019-06-14 ENCOUNTER — Telehealth: Payer: Self-pay

## 2019-06-14 NOTE — Telephone Encounter (Signed)
Attempted to contact patient to schedule hospital f/u with PCP. Also wanted to clarify if patient was moving primary care to the [facility]. Left message for patient to return call to office.

## 2019-06-28 ENCOUNTER — Encounter: Payer: Self-pay | Admitting: Family

## 2019-06-28 ENCOUNTER — Telehealth: Payer: Self-pay | Admitting: Pharmacy Technician

## 2019-06-28 ENCOUNTER — Other Ambulatory Visit: Payer: Self-pay

## 2019-06-28 ENCOUNTER — Ambulatory Visit (INDEPENDENT_AMBULATORY_CARE_PROVIDER_SITE_OTHER): Payer: Self-pay | Admitting: Family

## 2019-06-28 VITALS — BP 148/84 | HR 76 | Temp 98.4°F

## 2019-06-28 DIAGNOSIS — B182 Chronic viral hepatitis C: Secondary | ICD-10-CM | POA: Insufficient documentation

## 2019-06-28 DIAGNOSIS — F1010 Alcohol abuse, uncomplicated: Secondary | ICD-10-CM | POA: Insufficient documentation

## 2019-06-28 NOTE — Patient Instructions (Signed)
Nice to meet you.  We will check your blood work today and let you know the results.  Limit acetaminophen (Tylenol) usage to no more than 2 grams (2,000 mg) per day.  Avoid alcohol.  Do not share toothbrushes or razors.  Practice safe sex to protect against transmission as well as sexually transmitted disease.    Hepatitis C Hepatitis C is a viral infection of the liver. It can lead to scarring of the liver (cirrhosis), liver failure, or liver cancer. Hepatitis C may go undetected for months or years because people with the infection may not have symptoms, or they may have only mild symptoms. What are the causes? This condition is caused by the hepatitis C virus (HCV). The virus can spread from person to person (is contagious) through:  Blood.  Childbirth. A woman who has hepatitis C can pass it to her baby during birth.  Bodily fluids, such as breast milk, tears, semen, vaginal fluids, and saliva.  Blood transfusions or organ transplants done in the [place] before 1992.  What increases the risk? The following factors may make you more likely to develop this condition:  Having contact with unclean (contaminated) needles or syringes. This may result from: ? Acupuncture. ? Tattoing. ? Body piercing. ? Injecting drugs.  Having unprotected sex with someone who is infected.  Needing treatment to filter your blood (kidney dialysis).  Having HIV (human immunodeficiency virus) or AIDS (acquired immunodeficiency syndrome).  Working in a job that involves contact with blood or bodily fluids, such as health care.  What are the signs or symptoms? Symptoms of this condition include:  Fatigue.  Loss of appetite.  Nausea.  Vomiting.  Abdominal pain.  Dark yellow urine.  Yellowish skin and eyes (jaundice).  Itchy skin.  Clay-colored bowel movements.  Joint pain.  Bleeding and bruising easily.  Fluid building up in your stomach (ascites).  In some cases, you  may not have any symptoms. How is this diagnosed? This condition is diagnosed with:  Blood tests.  Other tests to check how well your liver is functioning. They may include: ? Magnetic resonance elastography (MRE). This imaging test uses MRIs and sound waves to measure liver stiffness. ? Transient elastography. This imaging test uses ultrasounds to measure liver stiffness. ? Liver biopsy. This test requires taking a small tissue sample from your liver to examine it under a microscope.  How is this treated? Your health care provider may perform noninvasive tests or a liver biopsy to help decide the best course of treatment. Treatment may include:  Antiviral medicines and other medicines.  Follow-up treatments every 6-12 months for infections or other liver conditions.  Receiving a donated liver (liver transplant).  Follow these instructions at home: Medicines  Take over-the-counter and prescription medicines only as told by your health care provider.  Take your antiviral medicine as told by your health care provider. Do not stop taking the antiviral even if you start to feel better.  Do not take any medicines unless approved by your health care provider, including over-the-counter medicines and birth control pills. Activity  Rest as needed.  Do not have sex unless approved by your health care provider.  Ask your health care provider when you may return to school or work. Eating and drinking  Eat a balanced diet with plenty of fruits and vegetables, whole grains, and lowfat (lean) meats or non-meat proteins (such as beans or tofu).  Drink enough fluids to keep your urine clear or pale yellow.    Do not drink alcohol. General instructions  Do not share toothbrushes, nail clippers, or razors.  Wash your hands frequently with soap and water. If soap and water are not available, use hand sanitizer.  Cover any cuts or open sores on your skin to prevent spreading the  virus.  Keep all follow-up visits as told by your health care provider. This is important. You may need follow-up visits every 6-12 months. How is this prevented? There is no vaccine for hepatitis C. The only way to prevent the disease is to reduce the risk of exposure to the virus. Make sure you:  Wash your hands frequently with soap and water. If soap and water are not available, use hand sanitizer.  Do not share needles or syringes.  Practice safe sex and use condoms.  Avoid handling blood or bodily fluids without gloves or other protection.  Avoid getting tattoos or piercings in shops or other locations that are not clean.  Contact a health care provider if:  You have a fever.  You develop abdominal pain.  You pass dark urine.  You pass clay-colored stools.  You develop joint pain. Get help right away if:  You have increasing fatigue or weakness.  You lose your appetite.  You cannot eat or drink without vomiting.  You develop jaundice or your jaundice gets worse.  You bruise or bleed easily. Summary  Hepatitis C is a viral infection of the liver. It can lead to scarring of the liver (cirrhosis), liver failure, or liver cancer.  The hepatitis C virus (HCV) causes this condition. The virus can pass from person to person (is contagious).  You should not take any medicines unless approved by your health care provider. This includes over-the-counter medicines and birth control pills. This information is not intended to replace advice given to you by your health care provider. Make sure you discuss any questions you have with your health care provider. Document Released: 10/10/2000 Document Revised: 11/18/2016 Document Reviewed: 11/18/2016 Elsevier Interactive Patient Education  2018 [employer]  

## 2019-06-28 NOTE — Assessment & Plan Note (Signed)
>>  ASSESSMENT AND PLAN FOR ALCOHOL ABUSE WRITTEN ON 06/28/2019  1:15 PM BY CALONE, GREGORY D, FNP  Currently 20 days sober following lifestyle changes resulting in exacerbation of alcohol use.  Congratulated on his accomplishment.  Encouraged to attend Alcoholics Anonymous meetings and counseling for support.

## 2019-06-28 NOTE — Assessment & Plan Note (Signed)
Trevor [name] appears to have chronic hepatitis C with most recent viral load from February 2020 being 420,000 and is currently asymptomatic.  We discussed the pathogenesis, transmission, prevention, risks if left untreated, and treatment options for hepatitis C.  Check blood work today.  Will require financial assistance and was able to meet with our pharmacy staff and is a currently applying for [facility] assistance as well.  Plan for treatment with Epclusa or Maverick pending blood work results.

## 2019-06-28 NOTE — Assessment & Plan Note (Signed)
Currently 20 days sober following lifestyle changes resulting in exacerbation of alcohol use.  Congratulated on his accomplishment.  Encouraged to attend Alcoholics Anonymous meetings and counseling for support.

## 2019-06-28 NOTE — Telephone Encounter (Signed)
[facility] Patient Advocate Encounter ° °Insurance verification completed.   ° °The patient is uninsured and will need patient assistance for medication. ° °We can complete the application and will need to meet with the patient for signatures and income documentation. ° °Tyrease Vandeberg E. Erron Wengert, CPhT °Specialty Pharmacy Patient Advocate °[facility] °Phone: [phone] °Fax:  [phone] ° ° °

## 2019-06-28 NOTE — Progress Notes (Signed)
Subjective:    Patient ID: Trevor [name], male    DOB: 03-19-1981, 38 y.o.   MRN: [id number]  Chief Complaint  Patient presents with  . Hepatitis C    HPI:  Trevor [name] is a 38 y.o. male with previous medical history of alcohol abuse and herpes simplex infection presenting today for evaluation and treatment of hepatitis C.  Trevor [name] was initially diagnosed with hepatitis C in February/March of this year when he was attending detoxification for alcohol through [facility] blood work at the time showed a hepatitis C RNA level of 420,000 which was completed on 12/04/2018.  Denies history of IV drug use, sharing of razors/toothbrushes, blood transfusions prior to 1992, tattoos outside of parlor, or sexual contact with a known positive partner.  No personal or family history of liver disease.  He is currently asymptomatic with no abdominal pain, nausea, vomiting, scleral icterus, or jaundice.  He has not been treated for hepatitis C to date.  Currently has been sober from alcohol for the last 20 days as the past several months he is gone through several life-changing events including divorce as well as being laid off due to the coronavirus pandemic.  He is eager to get treated for hepatitis C.    No Known Allergies    Outpatient Medications Prior to Visit  Medication Sig Dispense Refill  . acyclovir (ZOVIRAX) 400 MG tablet TAKE 1 TABLET BY MOUTH THREE TIMES DAILY FOR 5 DAYS (Patient taking differently: Take 400 mg by mouth See admin instructions. Take 400 mg by mouth three times a day for 5 days as directed for flares) 30 tablet 1  . buPROPion (WELLBUTRIN XL) 150 MG 24 hr tablet Take 1 tablet (150 mg total) by mouth daily. 90 tablet 0  . ibuprofen (ADVIL) 200 MG tablet Take 200-400 mg by mouth every 6 (six) hours as needed (for headaches or pain).    . traZODone (DESYREL) 50 MG tablet Take 0.5-1 tablets (25-50 mg total) by mouth at bedtime as needed. for sleep 30 tablet 0  .  chlordiazePOXIDE (LIBRIUM) 5 MG capsule Take 1 capsule (5 mg total) by mouth 3 (three) times daily as needed for anxiety. 20 capsule 0  . atorvastatin (LIPITOR) 20 MG tablet Take 1 tablet (20 mg total) by mouth daily. (Patient not taking: Reported on 06/07/2019) 30 tablet 6  . omeprazole (PRILOSEC) 40 MG capsule TAKE ONE CAPSULE BY MOUTH 30MIN PRIOR TO BREAKFAST (Patient not taking: Reported on 06/07/2019) 90 capsule 1   No facility-administered medications prior to visit.      Past Medical History:  Diagnosis Date  . Elevated liver enzymes   . ETOH abuse   . 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 broken arm    . Left arm surgery    . WISDOM TOOTH EXTRACTION        Family History  Problem Relation Age of Onset  . Melanoma Mother   . Cancer Mother        skin  . Cancer Maternal Grandmother        lung  . Cancer Maternal Grandfather        lung      Social History   Socioeconomic History  . Marital status: Single    Spouse name: n/a  . Number of children: 0  . Years of education:  12  . Highest education level: Not on file  Occupational History  . Occupation: Research scientist (medical)  Social Needs  . Financial resource strain: Not on file  . Food insecurity    Worry: Not on file    Inability: Not on file  . Transportation needs    Medical: Not on file    Non-medical: Not on file  Tobacco Use  . Smoking status: Never Smoker  . Smokeless tobacco: Former Neurosurgeon    Types: Snuff  Substance and Sexual Activity  . Alcohol use: Not Currently    Comment: Sober for the last 20 days   . Drug use: No  . Sexual activity: Not Currently  Lifestyle  . Physical activity    Days per week: Not on file    Minutes per session: Not on file  . Stress: Not on file  Relationships  . Social Musician on phone: Not on file    Gets together: Not on file     Attends religious service: Not on file    Active member of club or organization: Not on file    Attends meetings of clubs or organizations: Not on file    Relationship status: Not on file  . Intimate partner violence    Fear of current or ex partner: Not on file    Emotionally abused: Not on file    Physically abused: Not on file    Forced sexual activity: Not on file  Other Topics Concern  . Not on file  Social History Narrative  . Not on file      Review of Systems  Constitutional: Negative for chills, diaphoresis, fatigue and fever.  Respiratory: Negative for cough, chest tightness, shortness of breath and wheezing.   Cardiovascular: Negative for chest pain.  Gastrointestinal: Negative for abdominal distention, abdominal pain, constipation, diarrhea, nausea and vomiting.  Neurological: Negative for weakness and headaches.  Hematological: Does not bruise/bleed easily.       Objective:    BP (!) 148/84   Pulse 76   Temp 98.4 F (36.9 C)  Nursing note and vital signs reviewed.  Physical Exam Constitutional:      General: He is not in acute distress.    Appearance: He is well-developed.  Cardiovascular:     Rate and Rhythm: Normal rate and regular rhythm.     Heart sounds: Normal heart sounds. No murmur. No friction rub. No gallop.   Pulmonary:     Effort: Pulmonary effort is normal. No respiratory distress.     Breath sounds: Normal breath sounds. No wheezing or rales.  Chest:     Chest wall: No tenderness.  Abdominal:     General: Bowel sounds are normal. There is no distension.     Palpations: Abdomen is soft. There is no mass.     Tenderness: There is no abdominal tenderness. There is no guarding or rebound.  Skin:    General: Skin is warm and dry.  Neurological:     Mental Status: He is alert and oriented to person, place, and time.  Psychiatric:        Behavior: Behavior normal.        Thought Content: Thought content normal.        Judgment: Judgment  normal.        Assessment & Plan:   Problem List Items Addressed This Visit      Digestive   Chronic hepatitis C without hepatic coma (HCC) - Primary  Trevor [name] appears to have chronic hepatitis C with most recent viral load from February 2020 being 420,000 and is currently asymptomatic.  We discussed the pathogenesis, transmission, prevention, risks if left untreated, and treatment options for hepatitis C.  Check blood work today.  Will require financial assistance and was able to meet with our pharmacy staff and [facility] a [facility].  Plan for treatment with Epclusa or Maverick pending blood work results.      Relevant Orders   CBC   COMPLETE METABOLIC PANEL WITH GFR   Hepatitis B surface antibody,qualitative   Hepatitis B surface antigen   Hepatitis C genotype   Hepatitis C RNA quantitative   Liver Fibrosis, FibroTest-ActiTest     Other   Alcohol abuse    Currently 20 days sober following lifestyle changes resulting in exacerbation of alcohol use.  Congratulated on his accomplishment.  Encouraged to attend Alcoholics Anonymous meetings and counseling for support.          I have discontinued Tawni PummelWilliam R. Niederer's atorvastatin, omeprazole, and chlordiazePOXIDE. I am also having him maintain his acyclovir, ibuprofen, traZODone, and buPROPion.   Follow-up: Return in about 1 month (around 07/28/2019), or if symptoms worsen or fail to improve.    Marcos EkeGreg Calone, MSN, FNP-C Nurse Practitioner [facility] Office phone: [phone] Pager: [phone] [facility] Main number: [phone]

## 2019-07-05 ENCOUNTER — Inpatient Hospital Stay: Payer: Self-pay | Admitting: Family Medicine

## 2019-07-05 LAB — LIVER FIBROSIS, FIBROTEST-ACTITEST
ALT: 69 U/L — ABNORMAL HIGH (ref 9–46)
Alpha-2-Macroglobulin: 142 mg/dL (ref 106–279)
Apolipoprotein A1: 130 mg/dL (ref 94–176)
Bilirubin: 0.6 mg/dL (ref 0.2–1.2)
Fibrosis Score: 0.15
GGT: 49 U/L (ref 3–90)
Haptoglobin: 133 mg/dL (ref 43–212)
Necroinflammat ACT Score: 0.36
Reference ID: 3066685

## 2019-07-05 LAB — CBC
HCT: 42.9 % (ref 38.5–50.0)
Hemoglobin: 15.1 g/dL (ref 13.2–17.1)
MCH: 34.4 pg — ABNORMAL HIGH (ref 27.0–33.0)
MCHC: 35.2 g/dL (ref 32.0–36.0)
MCV: 97.7 fL (ref 80.0–100.0)
MPV: 10.5 fL (ref 7.5–12.5)
Platelets: 326 10*3/uL (ref 140–400)
RBC: 4.39 10*6/uL (ref 4.20–5.80)
RDW: 12.7 % (ref 11.0–15.0)
WBC: 5.3 10*3/uL (ref 3.8–10.8)

## 2019-07-05 LAB — HEPATITIS C RNA QUANTITATIVE
HCV Quantitative Log: 3.7 Log IU/mL — ABNORMAL HIGH
HCV RNA, PCR, QN: 5030 IU/mL — ABNORMAL HIGH

## 2019-07-05 LAB — COMPLETE METABOLIC PANEL WITH GFR
AG Ratio: 1.9 (calc) (ref 1.0–2.5)
ALT: 71 U/L — ABNORMAL HIGH (ref 9–46)
AST: 32 U/L (ref 10–40)
Albumin: 4.4 g/dL (ref 3.6–5.1)
Alkaline phosphatase (APISO): 34 U/L — ABNORMAL LOW (ref 36–130)
BUN: 13 mg/dL (ref 7–25)
CO2: 24 mmol/L (ref 20–32)
Calcium: 9.9 mg/dL (ref 8.6–10.3)
Chloride: 104 mmol/L (ref 98–110)
Creat: 0.94 mg/dL (ref 0.60–1.35)
GFR, Est African American: 119 mL/min/{1.73_m2} (ref >=60)
GFR, Est Non African American: 102 mL/min/{1.73_m2} (ref >=60)
Globulin: 2.3 g/dL (calc) (ref 1.9–3.7)
Glucose, Bld: 92 mg/dL (ref 65–99)
Potassium: 4.3 mmol/L (ref 3.5–5.3)
Sodium: 138 mmol/L (ref 135–146)
Total Bilirubin: 0.6 mg/dL (ref 0.2–1.2)
Total Protein: 6.7 g/dL (ref 6.1–8.1)

## 2019-07-05 LAB — HEPATITIS B SURFACE ANTIBODY,QUALITATIVE: Hep B S Ab: REACTIVE — AB

## 2019-07-05 LAB — HEPATITIS C GENOTYPE

## 2019-07-05 LAB — HEPATITIS B SURFACE ANTIGEN: Hepatitis B Surface Ag: NONREACTIVE

## 2019-07-06 ENCOUNTER — Other Ambulatory Visit: Payer: Self-pay | Admitting: Family

## 2019-07-06 DIAGNOSIS — B182 Chronic viral hepatitis C: Secondary | ICD-10-CM

## 2019-07-06 MED ORDER — SOFOSBUVIR-VELPATASVIR 400-100 MG PO TABS: 1.0000 | ORAL_TABLET | Freq: Every day | ORAL | 2 refills | Status: DC

## 2019-07-06 NOTE — Progress Notes (Signed)
Trevor [name] has Genotype 1a Hepatitis C with initial viral load of 5,030 and fibrosis score of F0. Will plan for 12 weeks of treatment with Epclusa. Prescription sent to the pharmacy.

## 2019-07-07 ENCOUNTER — Other Ambulatory Visit: Payer: Self-pay | Admitting: Family

## 2019-07-07 DIAGNOSIS — B182 Chronic viral hepatitis C: Secondary | ICD-10-CM

## 2019-07-07 MED ORDER — SOFOSBUVIR-VELPATASVIR 400-100 MG PO TABS: 1.0000 | ORAL_TABLET | Freq: Every day | ORAL | 2 refills | Status: DC

## 2019-07-07 NOTE — Telephone Encounter (Addendum)
Received a call back this morning from the patient regarding his income status and desire to have the medication shipped to the clinic. The application for [employer] has been completed and faxed to the company for determination. I explained to the patient the process and hours the clinic is open for the pick up of his medication once approved and shipped.  Patient has been approved to receive Epclusa for 12 weeks at no charge. [facility] will reach out to set up shipment of the medication. Program enrollment dates 07/07/2019 to 09/29/2019. Patient is aware of approval, will follow up next week to see if he received the medication and set up follow-up appointment.

## 2019-07-18 NOTE — Telephone Encounter (Signed)
[facility] Patient Advocate Encounter  Patient's medication has been delivered to the clinic from [facility]. Called and left the patient a voicemail to inform him the medication arrived and to set up his 4 week follow up appointment.

## 2019-07-19 ENCOUNTER — Telehealth: Payer: Self-pay | Admitting: Pharmacist

## 2019-07-19 NOTE — Telephone Encounter (Signed)
Patient is approved to receive Epclusa x 12 weeks for chronic Hepatitis C infection. Counseled patient to take Epclusa daily with or without food. Encouraged patient not to miss any doses and explained how their chance of cure could go down with each dose missed. Counseled patient on what to do if dose is missed - if it is closer to the missed dose take immediately; if closer to next dose skip dose and take the next dose at the usual time.   Counseled patient on common side effects such as headache, fatigue, and nausea and that these normally decrease with time. I reviewed patient medications and found no drug interactions. Discussed with patient that there are several drug interactions including acid suppressants. Instructed patient to call clinic if he wishes to start a new medication during course of therapy. Also advised patient to call if he experiences any side effects. Patient will follow-up with me in the pharmacy clinic on 10/22.  

## 2019-07-19 NOTE — Telephone Encounter (Signed)
Great, thanks

## 2019-08-03 NOTE — Telephone Encounter (Signed)
[facility] Patient Advocate Encounter  [facility] called to set up the patient's next shipment of [place]. It is predicted to arrive around 10/13. We will call the patient and set up pick up when the medication arrives.

## 2019-08-05 ENCOUNTER — Telehealth: Payer: Self-pay | Admitting: Pharmacy Technician

## 2019-08-05 NOTE — Telephone Encounter (Signed)
[facility] Patient Advocate Encounter  Patient's medications have been mailed to [facility] from [facility] and are ready for pick up.  Trevor [name]. Trevor [name] Patient [facility] Phone: [phone] Fax:  [phone]

## 2019-08-08 NOTE — Telephone Encounter (Signed)
[facility] Patient Advocate Encounter  Left a voicemail for the patient to let him know that his medication is here and hours available for pickup. LVM @ 09:34am

## 2019-08-18 ENCOUNTER — Ambulatory Visit (INDEPENDENT_AMBULATORY_CARE_PROVIDER_SITE_OTHER): Payer: Self-pay | Admitting: Pharmacist

## 2019-08-18 ENCOUNTER — Other Ambulatory Visit: Payer: Self-pay

## 2019-08-18 DIAGNOSIS — B182 Chronic viral hepatitis C: Secondary | ICD-10-CM

## 2019-08-18 MED ORDER — BUPROPION HCL ER (XL) 150 MG PO TB24: 150.0000 mg | ORAL_TABLET | Freq: Every day | ORAL | 0 refills | Status: DC

## 2019-08-18 NOTE — Progress Notes (Signed)
HPI: Trevor [name] is a 38 y.o. male who presents to the [place] clinic for Hepatitis C follow-up.  Medication: Epclusa x 12 weeks  Start Date: 07/19/2019  Hepatitis C Genotype: 1a  Fibrosis Score: F0  Hepatitis C RNA: 5,030 on 06/28/2019  Patient Active Problem List   Diagnosis Date Noted  . Chronic hepatitis C without hepatic coma ([place]) 06/28/2019  . Alcohol abuse 06/28/2019  . Psoriasis 06/07/2019  . Suicidal ideation 06/07/2019  . Withdrawal symptoms, alcohol, with delirium ([place]) 06/07/2019  . Alcoholic hepatitis without ascites 06/05/2019  . Alcohol withdrawal ([place]) 06/03/2019  . Anxiety state 01/09/2016  . Obesity (BMI 30-39.9) 01/09/2016  . HSV-2 (herpes simplex virus 2) infection     Patient's Medications  New Prescriptions   No medications on file  Previous Medications   ACYCLOVIR (ZOVIRAX) 400 MG TABLET    TAKE 1 TABLET BY MOUTH THREE TIMES DAILY FOR 5 DAYS   BUPROPION (WELLBUTRIN XL) 150 MG 24 HR TABLET    Take 1 tablet (150 mg total) by mouth daily.   IBUPROFEN (ADVIL) 200 MG TABLET    Take 200-400 mg by mouth every 6 (six) hours as needed (for headaches or pain).   SOFOSBUVIR-VELPATASVIR (EPCLUSA) 400-100 MG TABS    Take 1 tablet by mouth daily. Take 1 tablet by mouth daily.   TRAZODONE (DESYREL) 50 MG TABLET    Take 0.5-1 tablets (25-50 mg total) by mouth at bedtime as needed. for sleep  Modified Medications   No medications on file  Discontinued Medications   No medications on file    Allergies: No Known Allergies  Past Medical History: Past Medical History:  Diagnosis Date  . Elevated liver enzymes   . ETOH abuse   . HSV-2 (herpes simplex virus 2) infection    Left upper chest wall; diagnosed by culture    Social History: Social History   Socioeconomic History  . Marital status: Single    Spouse name: n/a  . Number of children: 0  . Years of education: 22  . Highest education level: Not on file  Occupational History  .  Occupation: Psychologist, occupational  Social Needs  . Financial resource strain: Not on file  . Food insecurity    Worry: Not on file    Inability: Not on file  . Transportation needs    Medical: Not on file    Non-medical: Not on file  Tobacco Use  . Smoking status: Never Smoker  . Smokeless tobacco: Former Systems developer    Types: Snuff  Substance and Sexual Activity  . Alcohol use: Not Currently    Comment: Sober for the last 20 days   . Drug use: No  . Sexual activity: Not Currently  Lifestyle  . Physical activity    Days per week: Not on file    Minutes per session: Not on file  . Stress: Not on file  Relationships  . Social Herbalist on phone: Not on file    Gets together: Not on file    Attends religious service: Not on file    Active member of club or organization: Not on file    Attends meetings of clubs or organizations: Not on file    Relationship status: Not on file  Other Topics Concern  . Not on file  Social History Narrative  . Not on file    Labs: Hepatitis C Lab Results  Component Value Date   HCVGENOTYPE 1a 06/28/2019  HCVRNAPCRQN 5,030 (H) 06/28/2019   FIBROSTAGE F0 06/28/2019   Hepatitis B Lab Results  Component Value Date   HEPBSAB REACTIVE (A) 06/28/2019   HEPBSAG NON-REACTIVE 06/28/2019   Hepatitis A No results found for: HAV HIV Lab Results  Component Value Date   HIV Non Reactive 06/03/2019   Lab Results  Component Value Date   CREATININE 0.94 06/28/2019   CREATININE 0.79 06/11/2019   CREATININE 0.74 06/08/2019   CREATININE 0.76 06/07/2019   CREATININE 0.67 06/05/2019   Lab Results  Component Value Date   AST 32 06/28/2019   AST 76 (H) 06/11/2019   AST 78 (H) 06/08/2019   ALT 71 (H) 06/28/2019   ALT 69 (H) 06/28/2019   ALT 169 (H) 06/11/2019   INR 1.1 06/08/2019   INR 1.0 10/30/2007   INR 1.1 10/29/2007    Assessment: Trevor [name] was here today for his 4-week HCV follow-up visit. He is scheduled to receive 12  weeks of Epclusa started on 07/19/2019. He states he has not missed any doses, and he just started his second bottle Monday of this week. Aside from some mild headaches and tiredness, he has no complaints or concerns. We congratulated him on his perfect adherence and encouraged him to continue to power through some of the mild adverse effects he is experiencing.   He is in between primary care providers at this time after moving from [place], and he requested to have his Wellbutrin refilled here before he runs out. Cassie will send in a 30-day supply of Wellbutrin to give him time to get established with a new provider. Today we will send in labs to assess his HCV viral load and hepatic function. His end of treatment follow-up visit is scheduled for 10/18/2019 with Cassie.   Plan: - Ordered HCV RNA, CMP, and CBC - Continue Epclusa - Send in 30-day supply of Wellbutrin - Will follow up with Cassie on 10/18/2019 for his end of treatment visit  Ellison Carwin, PharmD PGY1 Pharmacy Resident [facility] 08/18/2019, 3:36 PM

## 2019-08-21 LAB — COMPREHENSIVE METABOLIC PANEL
AG Ratio: 1.9 (calc) (ref 1.0–2.5)
ALT: 19 U/L (ref 9–46)
AST: 18 U/L (ref 10–40)
Albumin: 4.5 g/dL (ref 3.6–5.1)
Alkaline phosphatase (APISO): 39 U/L (ref 36–130)
BUN: 16 mg/dL (ref 7–25)
CO2: 26 mmol/L (ref 20–32)
Calcium: 9.3 mg/dL (ref 8.6–10.3)
Chloride: 103 mmol/L (ref 98–110)
Creat: 1 mg/dL (ref 0.60–1.35)
Globulin: 2.4 g/dL (calc) (ref 1.9–3.7)
Glucose, Bld: 89 mg/dL (ref 65–99)
Potassium: 3.8 mmol/L (ref 3.5–5.3)
Sodium: 136 mmol/L (ref 135–146)
Total Bilirubin: 0.6 mg/dL (ref 0.2–1.2)
Total Protein: 6.9 g/dL (ref 6.1–8.1)

## 2019-08-21 LAB — CBC
HCT: 43.9 % (ref 38.5–50.0)
Hemoglobin: 15.3 g/dL (ref 13.2–17.1)
MCH: 33 pg (ref 27.0–33.0)
MCHC: 34.9 g/dL (ref 32.0–36.0)
MCV: 94.8 fL (ref 80.0–100.0)
MPV: 10.3 fL (ref 7.5–12.5)
Platelets: 308 10*3/uL (ref 140–400)
RBC: 4.63 10*6/uL (ref 4.20–5.80)
RDW: 11.7 % (ref 11.0–15.0)
WBC: 8.3 10*3/uL (ref 3.8–10.8)

## 2019-08-21 LAB — HEPATITIS C RNA QUANTITATIVE
HCV Quantitative Log: <1.18 Log IU/mL
HCV RNA, PCR, QN: <15 IU/mL

## 2019-08-25 NOTE — Telephone Encounter (Signed)
[facility] Patient Advocate Encounter  [facility] called to set up patient's shipment of Epclusa and confirmed it's arrival to be around 09/01/2019. We will call the patient to pick up when the medication arrives.

## 2019-08-31 NOTE — Telephone Encounter (Signed)
[facility] Patient Advocate Encounter  Patient's medication arrived today to the clinic. I left him a voicemail for date and times we are open to pick up the medication.

## 2019-09-08 NOTE — Telephone Encounter (Signed)
[facility] Patient Advocate Encounter  Patient picked up his third bottle of Epclusa today 09/08/2019 and was provided information for his final appointment on 10/18/2019. He verified address and phone number and date of birth at the time of pick up.

## 2019-09-09 ENCOUNTER — Other Ambulatory Visit: Payer: Self-pay | Admitting: Pharmacist

## 2019-09-09 DIAGNOSIS — B182 Chronic viral hepatitis C: Secondary | ICD-10-CM

## 2019-10-18 ENCOUNTER — Ambulatory Visit: Payer: Self-pay | Admitting: Pharmacist

## 2019-10-18 ENCOUNTER — Other Ambulatory Visit: Payer: Self-pay

## 2020-04-22 ENCOUNTER — Emergency Department (HOSPITAL_COMMUNITY)
Admission: EM | Admit: 2020-04-22 | Discharge: 2020-04-22 | Disposition: A | Discharge status: Home / Self Care | Payer: Self-pay | Attending: Emergency Medicine | Admitting: Emergency Medicine

## 2020-04-22 ENCOUNTER — Encounter (HOSPITAL_COMMUNITY): Payer: Self-pay | Admitting: Emergency Medicine

## 2020-04-22 ENCOUNTER — Other Ambulatory Visit: Payer: Self-pay

## 2020-04-22 DIAGNOSIS — F10220 Alcohol dependence with intoxication, uncomplicated: Secondary | ICD-10-CM | POA: Insufficient documentation

## 2020-04-22 DIAGNOSIS — Z5321 Procedure and treatment not carried out due to patient leaving prior to being seen by health care provider: Secondary | ICD-10-CM | POA: Insufficient documentation

## 2020-04-22 LAB — COMPREHENSIVE METABOLIC PANEL
ALT: 57 U/L — ABNORMAL HIGH (ref 0–44)
AST: 36 U/L (ref 15–41)
Albumin: 4.4 g/dL (ref 3.5–5.0)
Alkaline Phosphatase: 53 U/L (ref 38–126)
Anion gap: 16 — ABNORMAL HIGH (ref 5–15)
BUN: 7 mg/dL (ref 6–20)
CO2: 22 mmol/L (ref 22–32)
Calcium: 8.6 mg/dL — ABNORMAL LOW (ref 8.9–10.3)
Chloride: 107 mmol/L (ref 98–111)
Creatinine, Ser: 0.86 mg/dL (ref 0.61–1.24)
GFR calc Af Amer: >60 mL/min (ref >60)
GFR calc non Af Amer: >60 mL/min (ref >60)
Glucose, Bld: 138 mg/dL — ABNORMAL HIGH (ref 70–99)
Potassium: 3.7 mmol/L (ref 3.5–5.1)
Sodium: 145 mmol/L (ref 135–145)
Total Bilirubin: 0.4 mg/dL (ref 0.3–1.2)
Total Protein: 7.7 g/dL (ref 6.5–8.1)

## 2020-04-22 LAB — CBC
HCT: 47.2 % (ref 39.0–52.0)
Hemoglobin: 16.4 g/dL (ref 13.0–17.0)
MCH: 33.1 pg (ref 26.0–34.0)
MCHC: 34.7 g/dL (ref 30.0–36.0)
MCV: 95.4 fL (ref 80.0–100.0)
Platelets: 378 10*3/uL (ref 150–400)
RBC: 4.95 MIL/uL (ref 4.22–5.81)
RDW: 12.2 % (ref 11.5–15.5)
WBC: 9.4 10*3/uL (ref 4.0–10.5)
nRBC: 0 % (ref 0.0–0.2)

## 2020-04-22 LAB — ETHANOL: Alcohol, Ethyl (B): 368 mg/dL (ref <10)

## 2020-04-22 NOTE — ED Triage Notes (Signed)
Pt requesting detox from ETOH.  Last ETOH 6 hours ago.  States he has been drinking since he was 14.  States, "I haven't started shaking yet but I know I will."  Denies pain.  Denies SI/HI.

## 2020-04-22 NOTE — ED Provider Notes (Cosign Needed)
Went to assess patient. Patient is not in the room, no belongings noted. Informed RN Fields that patient likely eloped from the department prior to assessment.   Jeanie Sewer, PA-C 04/22/20 1944

## 2022-06-17 ENCOUNTER — Encounter (HOSPITAL_COMMUNITY): Payer: Self-pay

## 2022-06-17 ENCOUNTER — Inpatient Hospital Stay (HOSPITAL_COMMUNITY)
Admission: EM | Admit: 2022-06-17 | Discharge: 2022-06-23 | DRG: 897 | Disposition: A | Discharge status: Home / Self Care | Payer: Self-pay | Attending: Internal Medicine | Admitting: Internal Medicine

## 2022-06-17 ENCOUNTER — Emergency Department (HOSPITAL_COMMUNITY): Payer: Self-pay

## 2022-06-17 DIAGNOSIS — G4700 Insomnia, unspecified: Secondary | ICD-10-CM | POA: Diagnosis present

## 2022-06-17 DIAGNOSIS — F10231 Alcohol dependence with withdrawal delirium: Principal | ICD-10-CM | POA: Diagnosis present

## 2022-06-17 DIAGNOSIS — F10939 Alcohol use, unspecified with withdrawal, unspecified: Secondary | ICD-10-CM | POA: Diagnosis present

## 2022-06-17 DIAGNOSIS — R9431 Abnormal electrocardiogram [ECG] [EKG]: Secondary | ICD-10-CM

## 2022-06-17 DIAGNOSIS — Z87891 Personal history of nicotine dependence: Secondary | ICD-10-CM

## 2022-06-17 DIAGNOSIS — S01512A Laceration without foreign body of oral cavity, initial encounter: Secondary | ICD-10-CM | POA: Diagnosis present

## 2022-06-17 DIAGNOSIS — I951 Orthostatic hypotension: Secondary | ICD-10-CM | POA: Diagnosis present

## 2022-06-17 DIAGNOSIS — Z79899 Other long term (current) drug therapy: Secondary | ICD-10-CM

## 2022-06-17 DIAGNOSIS — E871 Hypo-osmolality and hyponatremia: Secondary | ICD-10-CM

## 2022-06-17 DIAGNOSIS — G4089 Other seizures: Secondary | ICD-10-CM | POA: Diagnosis present

## 2022-06-17 DIAGNOSIS — K7010 Alcoholic hepatitis without ascites: Secondary | ICD-10-CM | POA: Diagnosis present

## 2022-06-17 DIAGNOSIS — F411 Generalized anxiety disorder: Secondary | ICD-10-CM | POA: Diagnosis present

## 2022-06-17 DIAGNOSIS — Z8619 Personal history of other infectious and parasitic diseases: Secondary | ICD-10-CM

## 2022-06-17 DIAGNOSIS — R569 Unspecified convulsions: Secondary | ICD-10-CM

## 2022-06-17 DIAGNOSIS — K219 Gastro-esophageal reflux disease without esophagitis: Secondary | ICD-10-CM

## 2022-06-17 DIAGNOSIS — Z808 Family history of malignant neoplasm of other organs or systems: Secondary | ICD-10-CM

## 2022-06-17 DIAGNOSIS — E869 Volume depletion, unspecified: Secondary | ICD-10-CM | POA: Diagnosis present

## 2022-06-17 DIAGNOSIS — R7401 Elevation of levels of liver transaminase levels: Secondary | ICD-10-CM

## 2022-06-17 DIAGNOSIS — F1020 Alcohol dependence, uncomplicated: Secondary | ICD-10-CM | POA: Diagnosis present

## 2022-06-17 DIAGNOSIS — R4020 Unspecified coma: Secondary | ICD-10-CM

## 2022-06-17 DIAGNOSIS — R1110 Vomiting, unspecified: Secondary | ICD-10-CM | POA: Diagnosis present

## 2022-06-17 DIAGNOSIS — E876 Hypokalemia: Secondary | ICD-10-CM

## 2022-06-17 DIAGNOSIS — Z801 Family history of malignant neoplasm of trachea, bronchus and lung: Secondary | ICD-10-CM

## 2022-06-17 LAB — COMPREHENSIVE METABOLIC PANEL
ALT: 270 U/L — ABNORMAL HIGH (ref 0–44)
AST: 206 U/L — ABNORMAL HIGH (ref 15–41)
Albumin: 5 g/dL (ref 3.5–5.0)
Alkaline Phosphatase: 69 U/L (ref 38–126)
Anion gap: 13 (ref 5–15)
BUN: 10 mg/dL (ref 6–20)
CO2: 24 mmol/L (ref 22–32)
Calcium: 10 mg/dL (ref 8.9–10.3)
Chloride: 94 mmol/L — ABNORMAL LOW (ref 98–111)
Creatinine, Ser: 0.77 mg/dL (ref 0.61–1.24)
GFR, Estimated: >60 mL/min (ref >60)
Glucose, Bld: 175 mg/dL — ABNORMAL HIGH (ref 70–99)
Potassium: 3.2 mmol/L — ABNORMAL LOW (ref 3.5–5.1)
Sodium: 131 mmol/L — ABNORMAL LOW (ref 135–145)
Total Bilirubin: 1.6 mg/dL — ABNORMAL HIGH (ref 0.3–1.2)
Total Protein: 8.6 g/dL — ABNORMAL HIGH (ref 6.5–8.1)

## 2022-06-17 LAB — CBC
HCT: 48.3 % (ref 39.0–52.0)
Hemoglobin: 17.5 g/dL — ABNORMAL HIGH (ref 13.0–17.0)
MCH: 32.8 pg (ref 26.0–34.0)
MCHC: 36.2 g/dL — ABNORMAL HIGH (ref 30.0–36.0)
MCV: 90.4 fL (ref 80.0–100.0)
Platelets: 193 10*3/uL (ref 150–400)
RBC: 5.34 MIL/uL (ref 4.22–5.81)
RDW: 12.5 % (ref 11.5–15.5)
WBC: 6.1 10*3/uL (ref 4.0–10.5)
nRBC: 0 % (ref 0.0–0.2)

## 2022-06-17 LAB — RAPID URINE DRUG SCREEN, HOSP PERFORMED
Amphetamines: NOT DETECTED
Barbiturates: POSITIVE — AB
Benzodiazepines: NOT DETECTED
Cocaine: NOT DETECTED
Opiates: NOT DETECTED
Tetrahydrocannabinol: NOT DETECTED

## 2022-06-17 LAB — ETHANOL: Alcohol, Ethyl (B): <10 mg/dL (ref <10)

## 2022-06-17 LAB — MAGNESIUM: Magnesium: 2.3 mg/dL (ref 1.7–2.4)

## 2022-06-17 MED ORDER — THIAMINE HCL 100 MG PO TABS
100.0000 mg | ORAL_TABLET | Freq: Every day | ORAL | Status: DC
  Administered 2022-06-17 – 2022-06-19 (×3): 100 mg via ORAL
  Filled 2022-06-17 (×3): qty 1

## 2022-06-17 MED ORDER — LORAZEPAM 2 MG/ML IJ SOLN: 0.0000 mg | Freq: Two times a day (BID) | INTRAMUSCULAR | Status: DC

## 2022-06-17 MED ORDER — LORAZEPAM 2 MG/ML IJ SOLN
1.0000 mg | INTRAMUSCULAR | Status: DC | PRN
  Administered 2022-06-17 – 2022-06-18 (×2): 2 mg via INTRAVENOUS
  Administered 2022-06-19 (×3): 3 mg via INTRAVENOUS
  Administered 2022-06-19: 4 mg via INTRAVENOUS
  Administered 2022-06-19: 2 mg via INTRAVENOUS
  Filled 2022-06-17 (×3): qty 2
  Filled 2022-06-17 (×2): qty 1
  Filled 2022-06-17: qty 2
  Filled 2022-06-17: qty 1

## 2022-06-17 MED ORDER — ONDANSETRON HCL 4 MG PO TABS: 4.0000 mg | ORAL_TABLET | Freq: Four times a day (QID) | ORAL | Status: DC | PRN

## 2022-06-17 MED ORDER — PHENOBARBITAL SODIUM 130 MG/ML IJ SOLN
130.0000 mg | Freq: Once | INTRAMUSCULAR | Status: AC
  Administered 2022-06-17: 130 mg via INTRAVENOUS
  Filled 2022-06-17: qty 1

## 2022-06-17 MED ORDER — MAGNESIUM SULFATE 2 GM/50ML IV SOLN
2.0000 g | Freq: Once | INTRAVENOUS | Status: AC
  Administered 2022-06-17: 2 g via INTRAVENOUS
  Filled 2022-06-17: qty 50

## 2022-06-17 MED ORDER — ADULT MULTIVITAMIN W/MINERALS CH
1.0000 | ORAL_TABLET | Freq: Every day | ORAL | Status: DC
  Administered 2022-06-17 – 2022-06-19 (×3): 1 via ORAL
  Filled 2022-06-17 (×3): qty 1

## 2022-06-17 MED ORDER — POTASSIUM CHLORIDE 20 MEQ PO PACK
60.0000 meq | PACK | Freq: Once | ORAL | Status: AC
  Administered 2022-06-17: 60 meq via ORAL
  Filled 2022-06-17: qty 3

## 2022-06-17 MED ORDER — SODIUM CHLORIDE 0.9 % IV SOLN
260.0000 mg | Freq: Once | INTRAVENOUS | Status: AC
  Administered 2022-06-17: 260 mg via INTRAVENOUS
  Filled 2022-06-17: qty 2

## 2022-06-17 MED ORDER — ACETAMINOPHEN 325 MG PO TABS
650.0000 mg | ORAL_TABLET | Freq: Four times a day (QID) | ORAL | Status: DC | PRN
  Administered 2022-06-18: 650 mg via ORAL
  Filled 2022-06-17: qty 2

## 2022-06-17 MED ORDER — PANTOPRAZOLE SODIUM 40 MG PO TBEC
40.0000 mg | DELAYED_RELEASE_TABLET | Freq: Every day | ORAL | Status: DC
  Administered 2022-06-17 – 2022-06-19 (×3): 40 mg via ORAL
  Filled 2022-06-17 (×3): qty 1

## 2022-06-17 MED ORDER — HEPARIN SODIUM (PORCINE) 5000 UNIT/ML IJ SOLN
5000.0000 [IU] | Freq: Three times a day (TID) | INTRAMUSCULAR | Status: DC
  Administered 2022-06-18 – 2022-06-19 (×4): 5000 [IU] via SUBCUTANEOUS
  Filled 2022-06-17 (×4): qty 1

## 2022-06-17 MED ORDER — MORPHINE SULFATE (PF) 2 MG/ML IV SOLN: 2.0000 mg | INTRAVENOUS | Status: DC | PRN

## 2022-06-17 MED ORDER — LORAZEPAM 2 MG/ML IJ SOLN: 0.0000 mg | Freq: Four times a day (QID) | INTRAMUSCULAR | Status: DC

## 2022-06-17 MED ORDER — LORAZEPAM 1 MG PO TABS
1.0000 mg | ORAL_TABLET | ORAL | Status: DC | PRN
  Administered 2022-06-17: 1 mg via ORAL
  Administered 2022-06-18 (×3): 2 mg via ORAL
  Administered 2022-06-18 (×2): 1 mg via ORAL
  Administered 2022-06-19: 2 mg via ORAL
  Filled 2022-06-17 (×2): qty 1
  Filled 2022-06-17: qty 2
  Filled 2022-06-17: qty 1
  Filled 2022-06-17 (×3): qty 2

## 2022-06-17 MED ORDER — ONDANSETRON HCL 4 MG/2ML IJ SOLN: 4.0000 mg | Freq: Four times a day (QID) | INTRAMUSCULAR | Status: DC | PRN

## 2022-06-17 MED ORDER — TRAZODONE HCL 50 MG PO TABS: 25.0000 mg | ORAL_TABLET | Freq: Every evening | ORAL | Status: DC | PRN

## 2022-06-17 MED ORDER — THIAMINE HCL 100 MG/ML IJ SOLN: 100.0000 mg | Freq: Every day | INTRAMUSCULAR | Status: DC

## 2022-06-17 MED ORDER — PHENOBARBITAL SODIUM 130 MG/ML IJ SOLN: 260.0000 mg | Freq: Once | INTRAMUSCULAR | Status: DC

## 2022-06-17 MED ORDER — ACETAMINOPHEN 650 MG RE SUPP: 650.0000 mg | Freq: Four times a day (QID) | RECTAL | Status: DC | PRN

## 2022-06-17 MED ORDER — OXYCODONE HCL 5 MG PO TABS
5.0000 mg | ORAL_TABLET | ORAL | Status: DC | PRN
  Administered 2022-06-17 – 2022-06-18 (×3): 5 mg via ORAL
  Filled 2022-06-17 (×3): qty 1

## 2022-06-17 MED ORDER — SODIUM CHLORIDE 0.9 % IV SOLN: INTRAVENOUS | Status: DC

## 2022-06-17 MED ORDER — FOLIC ACID 1 MG PO TABS
1.0000 mg | ORAL_TABLET | Freq: Every day | ORAL | Status: DC
  Administered 2022-06-17 – 2022-06-19 (×3): 1 mg via ORAL
  Filled 2022-06-17 (×3): qty 1

## 2022-06-17 NOTE — Social Work (Signed)
CSW added substance resources to the Pt chart.

## 2022-06-17 NOTE — ED Notes (Signed)
Pt ambulated to the restroom.

## 2022-06-17 NOTE — ED Notes (Signed)
AC called for phenobarbital  

## 2022-06-17 NOTE — ED Triage Notes (Signed)
Pt states he quit drinking yesterday morning. Pt states he has been shaking. Pt also states he had a syncopal/seizure episode PTA. Pt was consuming 3 bottles of wine/day x 3 months.

## 2022-06-17 NOTE — ED Notes (Signed)
Patient transported to CT 

## 2022-06-17 NOTE — ED Notes (Signed)
AC called for phenobarbital

## 2022-06-17 NOTE — ED Notes (Signed)
Seizure pads placed on bed rails, suction set up at bedside.

## 2022-06-17 NOTE — ED Provider Notes (Signed)
[facility] MEDICAL SURGICAL UNIT Provider Note  CSN: [id number] Arrival date & time: 06/17/22 1438  Chief Complaint(s) Alcohol Problem  HPI Trevor [name] is a 41 y.o. male with history of alcohol use disorder presenting to the emergency department with tremor.  Patient reports that he typically drinks 3 bottles of wine a day for 3 months.  He stopped yesterday as he wanted to quit drinking.  He reports that earlier today he had an episode of syncope, unsure if he had a seizure as it was unwitnessed, but he did bite his tongue.  He is unsure how long it lasted.  He has no headache and does not think he hit his head, did fall on his left shoulder and reports some soreness but it is mild.  He reports associated anxiety, insomnia.  He reports nausea and vomiting this morning.  Denies fevers or chills.  Denies cough, runny nose, sore throat   Past Medical History Past Medical History:  Diagnosis Date   Elevated liver enzymes    ETOH abuse    HSV-2 (herpes simplex virus 2) infection    Left upper chest wall; diagnosed by culture   Patient Active Problem List   Diagnosis Date Noted   Chronic hepatitis C without hepatic coma (HCC) 06/28/2019   Alcohol abuse 06/28/2019   Psoriasis 06/07/2019   Suicidal ideation 06/07/2019   Withdrawal symptoms, alcohol, with delirium (HCC) 06/07/2019   Alcoholic hepatitis without ascites 06/05/2019   Alcohol withdrawal (HCC) 06/03/2019   Anxiety state 01/09/2016   Obesity (BMI 30-39.9) 01/09/2016   HSV-2 (herpes simplex virus 2) infection    Home Medication(s) Prior to Admission medications   Medication Sig Start Date End Date Taking? Authorizing Provider  acyclovir (ZOVIRAX) 400 MG tablet TAKE 1 TABLET BY MOUTH THREE TIMES DAILY FOR 5 DAYS Patient taking differently: Take 400 mg by mouth See admin instructions. Take 400 mg by mouth three times a day for 5 days as directed for flares 04/24/17   Sheliah Hatch, MD  buPROPion (WELLBUTRIN XL) 150  MG 24 hr tablet Take 1 tablet (150 mg total) by mouth daily. 08/18/19   Kuppelweiser, Cassie L, RPH-CPP  ibuprofen (ADVIL) 200 MG tablet Take 200-400 mg by mouth every 6 (six) hours as needed (for headaches or pain).    [provider]  Sofosbuvir-Velpatasvir (EPCLUSA) 400-100 MG TABS Take 1 tablet by mouth daily. Take 1 tablet by mouth daily. 07/07/19   Veryl Speak, FNP  traZODone (DESYREL) 50 MG tablet Take 0.5-1 tablets (25-50 mg total) by mouth at bedtime as needed. for sleep 06/11/19   Leatha Gilding, MD                                                                                                                                    Past Surgical History Past Surgical History:  Procedure Laterality Date   broken arm  Left arm surgery     WISDOM TOOTH EXTRACTION     Family History Family History  Problem Relation Age of Onset   Melanoma Mother    Cancer Mother        skin   Cancer Maternal Grandmother        lung   Cancer Maternal Grandfather        lung    Social History Social History   Tobacco Use   Smoking status: Never   Smokeless tobacco: Former    Types: Snuff  Vaping Use   Vaping Use: Never used  Substance Use Topics   Alcohol use: Yes    Alcohol/week: 84.0 standard drinks of alcohol    Types: 84 Glasses of wine per week   Drug use: No   Allergies Patient has no known allergies.  Review of Systems Review of Systems  All other systems reviewed and are negative.   Physical Exam Vital Signs  I have reviewed the triage vital signs BP (!) 135/94   Pulse (!) 108   Temp 98.4 F (36.9 C)   Resp 20   Ht 5\' 9"  (1.753 m)   Wt 89.2 kg   SpO2 97%   BMI 29.04 kg/m  Physical Exam Vitals and nursing note reviewed.  Constitutional:      General: He is not in acute distress.    Appearance: Normal appearance.  HENT:     Mouth/Throat:     Mouth: Mucous membranes are dry.     Comments: Superficial tongue laceration to tip of  tongue Cardiovascular:     Rate and Rhythm: Regular rhythm. Tachycardia present.  Pulmonary:     Effort: Pulmonary effort is normal. No respiratory distress.     Breath sounds: Normal breath sounds.  Abdominal:     General: Abdomen is flat.     Palpations: Abdomen is soft.     Tenderness: There is no abdominal tenderness.  Skin:    General: Skin is warm and dry.     Capillary Refill: Capillary refill takes less than 2 seconds.  Neurological:     Mental Status: He is alert and oriented to person, place, and time. Mental status is at baseline.     Comments: +tongue fasiculations, + tremor   Psychiatric:        Mood and Affect: Mood normal.        Behavior: Behavior normal.     ED Results and Treatments Labs (all labs ordered are listed, but only abnormal results are displayed) Labs Reviewed  COMPREHENSIVE METABOLIC PANEL - Abnormal; Notable for the following components:      Result Value   Sodium 131 (*)    Potassium 3.2 (*)    Chloride 94 (*)    Glucose, Bld 175 (*)    Total Protein 8.6 (*)    AST 206 (*)    ALT 270 (*)    Total Bilirubin 1.6 (*)    All other components within normal limits  CBC - Abnormal; Notable for the following components:   Hemoglobin 17.5 (*)    MCHC 36.2 (*)    All other components within normal limits  RAPID URINE DRUG SCREEN, HOSP PERFORMED - Abnormal; Notable for the following components:   Barbiturates POSITIVE (*)    All other components within normal limits  ETHANOL  MAGNESIUM  HIV ANTIBODY (ROUTINE TESTING W REFLEX)  COMPREHENSIVE METABOLIC PANEL  MAGNESIUM  CBC WITH DIFFERENTIAL/PLATELET  URINALYSIS, COMPLETE (UACMP) WITH MICROSCOPIC  Radiology CT Head Wo Contrast  Result Date: 06/17/2022 CLINICAL DATA:  Altered mental status following alcohol abuse and a syncopal episode or seizure prior to admission. EXAM: CT  HEAD WITHOUT CONTRAST TECHNIQUE: Contiguous axial images were obtained from the base of the skull through the vertex without intravenous contrast. RADIATION DOSE REDUCTION: This exam was performed according to the departmental dose-optimization program which includes automated exposure control, adjustment of the mA and/or kV according to patient size and/or use of iterative reconstruction technique. COMPARISON:  None Available. FINDINGS: Brain: Normal appearing cerebral hemispheres and posterior fossa structures. Normal size and position of the ventricles. No intracranial hemorrhage, mass lesion or CT evidence of acute infarction. Vascular: No hyperdense vessel or unexpected calcification. Skull: Normal. Negative for fracture or focal lesion. Sinuses/Orbits: Unremarkable. Other: None. IMPRESSION: Normal examination. Electronically Signed   By: Claudie Revering M.D.   On: 06/17/2022 17:50    Pertinent labs & imaging results that were available during my care of the patient were reviewed by me and considered in my medical decision making (see MDM for details).  Medications Ordered in ED Medications  thiamine (VITAMIN B1) tablet 100 mg (100 mg Oral Given 06/17/22 1614)    Or  thiamine (VITAMIN B1) injection 100 mg ( Intravenous See Alternative [id number] [id number])  folic acid (FOLVITE) tablet 1 mg (1 mg Oral Given 06/17/22 1614)  multivitamin with minerals tablet 1 tablet (1 tablet Oral Given 06/17/22 1614)  LORazepam (ATIVAN) tablet 1-4 mg ( Oral See Alternative 06/17/22 2341)    Or  LORazepam (ATIVAN) injection 1-4 mg (2 mg Intravenous Given 06/17/22 2341)  traZODone (DESYREL) tablet 25-50 mg (has no administration in time range)  heparin injection 5,000 Units (has no administration in time range)  acetaminophen (TYLENOL) tablet 650 mg (has no administration in time range)    Or  acetaminophen (TYLENOL) suppository 650 mg (has no administration in time range)  oxyCODONE (Oxy IR/ROXICODONE) immediate release tablet  5 mg (5 mg Oral Given 06/17/22 2348)  morphine (PF) 2 MG/ML injection 2 mg (has no administration in time range)  ondansetron (ZOFRAN) tablet 4 mg (has no administration in time range)    Or  ondansetron (ZOFRAN) injection 4 mg (has no administration in time range)  0.9 %  sodium chloride infusion ( Intravenous New Bag/Given 06/17/22 2343)  pantoprazole (PROTONIX) EC tablet 40 mg (40 mg Oral Given 06/17/22 2340)  PHENObarbital (LUMINAL) 260 mg in sodium chloride 0.9 % 100 mL IVPB (0 mg Intravenous Stopped 06/17/22 1635)  magnesium sulfate IVPB 2 g 50 mL (0 g Intravenous Stopped 06/17/22 1840)  potassium chloride (KLOR-CON) packet 60 mEq (60 mEq Oral Given 06/17/22 1740)  PHENObarbital (LUMINAL) injection 130 mg (130 mg Intravenous Given 06/17/22 1741)  PHENObarbital (LUMINAL) injection 130 mg (130 mg Intravenous Given 06/17/22 1904)  PHENObarbital (LUMINAL) injection 130 mg (130 mg Intravenous Given 06/17/22 2100)  Procedures .Critical Care  Performed by: Cristie Hem, MD Authorized by: Cristie Hem, MD   Critical care provider statement:    Critical care time (minutes):  30   Critical care was necessary to treat or prevent imminent or life-threatening deterioration of the following conditions:  CNS failure or compromise, toxidrome, circulatory failure and dehydration   Critical care was time spent personally by me on the following activities:  Development of treatment plan with patient or surrogate, discussions with consultants, evaluation of patient's response to treatment, examination of patient, ordering and review of laboratory studies, ordering and review of radiographic studies, ordering and performing treatments and interventions, pulse oximetry, re-evaluation of patient's condition and review of old charts   Care discussed with: admitting provider      (including critical care time)  Medical Decision Making / ED Course   MDM:  41 year old male presenting to the emergency department with alcohol withdrawal symptoms.  Generally well-appearing but vitals notable for significant tachycardia, exam with tongue laceration which is very superficial and tremor.  Patient will need treatment for alcohol withdrawal.  Suspect patient may have had a seizure due to alcohol withdrawal.  Denies significant headache but will obtain CT head given possible seizure and unknown if patient hit head.  Will check electrolytes given significant recent alcohol use to evaluate for alternative causes such as hyponatremia.  Will reassess frequently.  Clinical Course as of 06/18/22 0004  Tue Jun 17, 2022  2000 Despite multiple doses of phenobarbital, patient remained persistently tachycardic with tremor.  Discussed with the hospitalist who admit the patient for inpatient monitoring.  CT head negative for intracranial process. [WS]    Clinical Course User Index [WS] Cristie Hem, MD     Additional history obtained: -Additional history obtained from friend -External records from outside source obtained and reviewed including: Chart review including previous notes, labs, imaging, consultation notes   Lab Tests: -I ordered, reviewed, and interpreted labs.   The pertinent results include:   Labs Reviewed  COMPREHENSIVE METABOLIC PANEL - Abnormal; Notable for the following components:      Result Value   Sodium 131 (*)    Potassium 3.2 (*)    Chloride 94 (*)    Glucose, Bld 175 (*)    Total Protein 8.6 (*)    AST 206 (*)    ALT 270 (*)    Total Bilirubin 1.6 (*)    All other components within normal limits  CBC - Abnormal; Notable for the following components:   Hemoglobin 17.5 (*)    MCHC 36.2 (*)    All other components within normal limits  RAPID URINE DRUG SCREEN, HOSP PERFORMED - Abnormal; Notable for the following components:    Barbiturates POSITIVE (*)    All other components within normal limits  ETHANOL  MAGNESIUM  HIV ANTIBODY (ROUTINE TESTING W REFLEX)  COMPREHENSIVE METABOLIC PANEL  MAGNESIUM  CBC WITH DIFFERENTIAL/PLATELET  URINALYSIS, COMPLETE (UACMP) WITH MICROSCOPIC      EKG   EKG Interpretation  Date/Time:  Tuesday June 17 2022 14:52:10 EDT Ventricular Rate:  139 PR Interval:  130 QRS Duration: 84 QT Interval:  370 QTC Calculation: 563 R Axis:   60 Text Interpretation: Sinus tachycardia Rate related ST changes When compared with ECG of 03-Jun-2019 10:12, Appears similar Confirmed by Garnette Gunner [phone]) on 06/17/2022 3:57:06 PM         Imaging Studies ordered: I ordered imaging studies including CT head I independently visualized and interpreted imaging.  I agree with the radiologist interpretation   Medicines ordered and prescription drug management: Meds ordered this encounter  Medications   OR Linked Order Group    thiamine (VITAMIN B1) tablet 100 mg    thiamine (VITAMIN B1) injection 123XX123 mg   folic acid (FOLVITE) tablet 1 mg   multivitamin with minerals tablet 1 tablet   DISCONTD: LORazepam (ATIVAN) injection 0-4 mg    Order Specific Question:   CIWA-AR < 5 =    Answer:   0 mg    Order Specific Question:   CIWA-AR 5 -10 =    Answer:   1 mg    Order Specific Question:   CIWA-AR 11 -15 =    Answer:   2 mg    Order Specific Question:   CIWA-AR 16 -20 =    Answer:   3 mg    Order Specific Question:   CIWA-AR 16 -20 =    Answer:   Recheck CIWA-AR in 1 hour; if > 20 notify MD    Order Specific Question:   CIWA-AR > 20 =    Answer:   4 mg    Order Specific Question:   CIWA-AR > 20 =    Answer:   Call Rapid Response   DISCONTD: LORazepam (ATIVAN) injection 0-4 mg    Order Specific Question:   CIWA-AR < 5 =    Answer:   0 mg    Order Specific Question:   CIWA-AR 5 -10 =    Answer:   1 mg    Order Specific Question:   CIWA-AR 11 -15 =    Answer:   2 mg    Order  Specific Question:   CIWA-AR 16 -20 =    Answer:   3 mg    Order Specific Question:   CIWA-AR 16 -20 =    Answer:   Recheck CIWA-AR in 1 hour; if > 20 notify MD    Order Specific Question:   CIWA-AR > 20 =    Answer:   4 mg    Order Specific Question:   CIWA-AR > 20 =    Answer:   Call Rapid Response   DISCONTD: PHENObarbital (LUMINAL) injection 260 mg   PHENObarbital (LUMINAL) 260 mg in sodium chloride 0.9 % 100 mL IVPB   magnesium sulfate IVPB 2 g 50 mL   potassium chloride (KLOR-CON) packet 60 mEq   PHENObarbital (LUMINAL) injection 130 mg   PHENObarbital (LUMINAL) injection 130 mg   PHENObarbital (LUMINAL) injection 130 mg   OR Linked Order Group    LORazepam (ATIVAN) tablet 1-4 mg     Order Specific Question:   CIWA-AR < 5 =     Answer:   0 mg     Order Specific Question:   CIWA-AR 5 -10 =     Answer:   1 mg     Order Specific Question:   CIWA-AR 11 -15 =     Answer:   2 mg     Order Specific Question:   CIWA-AR 16 -20 =     Answer:   3 mg     Order Specific Question:   CIWA-AR 16 -20 =     Answer:   Recheck CIWA-AR in 1 hour; if > 20 notify MD     Order Specific Question:   CIWA-AR > 20 =     Answer:   4 mg     Order Specific Question:   CIWA-AR >  20 =     Answer:   Call Rapid Response    LORazepam (ATIVAN) injection 1-4 mg     Order Specific Question:   CIWA-AR < 5 =     Answer:   0 mg     Order Specific Question:   CIWA-AR 5 -10 =     Answer:   1 mg     Order Specific Question:   CIWA-AR 11 -15 =     Answer:   2 mg     Order Specific Question:   CIWA-AR 16 -20 =     Answer:   3 mg     Order Specific Question:   CIWA-AR 16 -20 =     Answer:   Recheck CIWA-AR in 1 hour; if > 20 notify MD     Order Specific Question:   CIWA-AR > 20 =     Answer:   4 mg     Order Specific Question:   CIWA-AR > 20 =     Answer:   Call Rapid Response   traZODone (DESYREL) tablet 25-50 mg    for sleep     heparin injection 5,000 Units   OR Linked Order Group    acetaminophen  (TYLENOL) tablet 650 mg    acetaminophen (TYLENOL) suppository 650 mg   oxyCODONE (Oxy IR/ROXICODONE) immediate release tablet 5 mg   morphine (PF) 2 MG/ML injection 2 mg   OR Linked Order Group    ondansetron (ZOFRAN) tablet 4 mg    ondansetron (ZOFRAN) injection 4 mg   0.9 %  sodium chloride infusion   pantoprazole (PROTONIX) EC tablet 40 mg    -I have reviewed the patients home medicines and have made adjustments as needed    Consultations Obtained: I requested consultation with the hospitalist,  and discussed lab and imaging findings as well as pertinent plan - they recommend: admission   Cardiac Monitoring: The patient was maintained on a cardiac monitor.  I personally viewed and interpreted the cardiac monitored which showed an underlying rhythm of: sinus tachycardia  Social Determinants of Health:  Factors impacting patients care include: alcohol use disorder   Reevaluation: After the interventions noted above, I reevaluated the patient and found that they have :improved  Co morbidities that complicate the patient evaluation  Past Medical History:  Diagnosis Date   Elevated liver enzymes    ETOH abuse    HSV-2 (herpes simplex virus 2) infection    Left upper chest wall; diagnosed by culture      Dispostion: Admit    Final Clinical Impression(s) / ED Diagnoses Final diagnoses:  Alcohol withdrawal syndrome with complication ([place])  Laceration of tongue, initial encounter     This chart was dictated using voice recognition software.  Despite best efforts to proofread,  errors can occur which can change the documentation meaning.    Cristie Hem, MD 06/18/22 0005

## 2022-06-18 ENCOUNTER — Observation Stay (HOSPITAL_COMMUNITY): Payer: Self-pay

## 2022-06-18 ENCOUNTER — Other Ambulatory Visit: Payer: Self-pay

## 2022-06-18 ENCOUNTER — Observation Stay (HOSPITAL_COMMUNITY)
Admit: 2022-06-18 | Discharge: 2022-06-18 | Disposition: A | Discharge status: Home / Self Care | Payer: Self-pay | Attending: Internal Medicine | Admitting: Internal Medicine

## 2022-06-18 DIAGNOSIS — R55 Syncope and collapse: Secondary | ICD-10-CM

## 2022-06-18 DIAGNOSIS — R9431 Abnormal electrocardiogram [ECG] [EKG]: Secondary | ICD-10-CM

## 2022-06-18 DIAGNOSIS — E876 Hypokalemia: Secondary | ICD-10-CM

## 2022-06-18 DIAGNOSIS — R7401 Elevation of levels of liver transaminase levels: Secondary | ICD-10-CM

## 2022-06-18 DIAGNOSIS — R4020 Unspecified coma: Secondary | ICD-10-CM

## 2022-06-18 DIAGNOSIS — F10930 Alcohol use, unspecified with withdrawal, uncomplicated: Secondary | ICD-10-CM

## 2022-06-18 DIAGNOSIS — K219 Gastro-esophageal reflux disease without esophagitis: Secondary | ICD-10-CM

## 2022-06-18 DIAGNOSIS — R569 Unspecified convulsions: Secondary | ICD-10-CM

## 2022-06-18 DIAGNOSIS — F411 Generalized anxiety disorder: Secondary | ICD-10-CM

## 2022-06-18 HISTORY — DX: Abnormal electrocardiogram (ECG) (EKG): R94.31

## 2022-06-18 HISTORY — DX: Unspecified coma: R40.20

## 2022-06-18 LAB — URINALYSIS, COMPLETE (UACMP) WITH MICROSCOPIC
Bilirubin Urine: NEGATIVE
Glucose, UA: NEGATIVE mg/dL
Hgb urine dipstick: NEGATIVE
Ketones, ur: 5 mg/dL — AB
Leukocytes,Ua: NEGATIVE
Nitrite: NEGATIVE
Protein, ur: NEGATIVE mg/dL
Specific Gravity, Urine: 1.009 (ref 1.005–1.030)
pH: 6 (ref 5.0–8.0)

## 2022-06-18 LAB — ECHOCARDIOGRAM COMPLETE
AR max vel: 4.03 cm2
AV Area VTI: 4.34 cm2
AV Area mean vel: 3 cm2
AV Mean grad: 2 mmHg
AV Peak grad: 3.7 mmHg
Ao pk vel: 0.96 m/s
Area-P 1/2: 4.8 cm2
Calc EF: 51 %
Height: 69 in
MV VTI: 5.02 cm2
S' Lateral: 3.1 cm
Single Plane A2C EF: 48.8 %
Single Plane A4C EF: 51.2 %
Weight: 3146.41 oz

## 2022-06-18 LAB — GLUCOSE, CAPILLARY: Glucose-Capillary: 127 mg/dL — ABNORMAL HIGH (ref 70–99)

## 2022-06-18 LAB — COMPREHENSIVE METABOLIC PANEL
ALT: 184 U/L — ABNORMAL HIGH (ref 0–44)
AST: 134 U/L — ABNORMAL HIGH (ref 15–41)
Albumin: 3.9 g/dL (ref 3.5–5.0)
Alkaline Phosphatase: 54 U/L (ref 38–126)
Anion gap: 8 (ref 5–15)
BUN: 9 mg/dL (ref 6–20)
CO2: 28 mmol/L (ref 22–32)
Calcium: 8.6 mg/dL — ABNORMAL LOW (ref 8.9–10.3)
Chloride: 95 mmol/L — ABNORMAL LOW (ref 98–111)
Creatinine, Ser: 0.9 mg/dL (ref 0.61–1.24)
GFR, Estimated: >60 mL/min (ref >60)
Glucose, Bld: 93 mg/dL (ref 70–99)
Potassium: 3.1 mmol/L — ABNORMAL LOW (ref 3.5–5.1)
Sodium: 131 mmol/L — ABNORMAL LOW (ref 135–145)
Total Bilirubin: 1.5 mg/dL — ABNORMAL HIGH (ref 0.3–1.2)
Total Protein: 6.9 g/dL (ref 6.5–8.1)

## 2022-06-18 LAB — CBC WITH DIFFERENTIAL/PLATELET
Abs Immature Granulocytes: 0.02 10*3/uL (ref 0.00–0.07)
Basophils Absolute: 0 10*3/uL (ref 0.0–0.1)
Basophils Relative: 0 %
Eosinophils Absolute: 0.1 10*3/uL (ref 0.0–0.5)
Eosinophils Relative: 1 %
HCT: 43 % (ref 39.0–52.0)
Hemoglobin: 14.8 g/dL (ref 13.0–17.0)
Immature Granulocytes: 0 %
Lymphocytes Relative: 18 %
Lymphs Abs: 1.2 10*3/uL (ref 0.7–4.0)
MCH: 32.3 pg (ref 26.0–34.0)
MCHC: 34.4 g/dL (ref 30.0–36.0)
MCV: 93.9 fL (ref 80.0–100.0)
Monocytes Absolute: 1 10*3/uL (ref 0.1–1.0)
Monocytes Relative: 16 %
Neutro Abs: 4 10*3/uL (ref 1.7–7.7)
Neutrophils Relative %: 65 %
Platelets: 149 10*3/uL — ABNORMAL LOW (ref 150–400)
RBC: 4.58 MIL/uL (ref 4.22–5.81)
RDW: 12.9 % (ref 11.5–15.5)
WBC: 6.3 10*3/uL (ref 4.0–10.5)
nRBC: 0 % (ref 0.0–0.2)

## 2022-06-18 LAB — HIV ANTIBODY (ROUTINE TESTING W REFLEX): HIV Screen 4th Generation wRfx: NONREACTIVE

## 2022-06-18 LAB — MAGNESIUM: Magnesium: 2.3 mg/dL (ref 1.7–2.4)

## 2022-06-18 NOTE — Assessment & Plan Note (Signed)
-   Likely alcoholic hepatitis - Right upper quadrant ultrasound - History of hepatitis C finished Epclusa - Continue to monitor

## 2022-06-18 NOTE — Assessment & Plan Note (Signed)
>>  ASSESSMENT AND PLAN FOR ALCOHOL WITHDRAWAL (HCC) WRITTEN ON 06/23/2022  8:13 AM BY TAT, DAVID, MD  -DELIRIUM TREMENS -Last drink 36 hours PTA -Had been drinking 3 bottles of wine per day -3 doses of phenobarbital in the ED -06/19/22--received ativan 15 mg in last 8 hours with continued agitation -06/19/22--transfer to SDU and start precedex>>stabilized -06/22/22>>>weaned precedex off in early am;  Use intermittent ativan for withdrawal symptoms;  Started po librium --continues to improve on po librium --I informed patient not to drive until he follows up with neurology

## 2022-06-18 NOTE — Progress Notes (Signed)
*  PRELIMINARY RESULTS* Echocardiogram 2D Echocardiogram has been performed.  Carolyne Fiscal 06/18/2022, 3:38 PM

## 2022-06-18 NOTE — Assessment & Plan Note (Addendum)
-   Secondary to withdrawals and hypokalemia -Monitor on telemetry -Avoid QT prolonging agents when possible -8/27--personally reviewed EKG--sinus, QTc = 422

## 2022-06-18 NOTE — Assessment & Plan Note (Signed)
-   Unwitnessed - Bit tongue, no bowel or bladder incontinence - EEG - Consult neuro

## 2022-06-18 NOTE — Assessment & Plan Note (Addendum)
-  DELIRIUM TREMENS -Last drink 36 hours PTA -Had been drinking 3 bottles of wine per day -3 doses of phenobarbital in the ED -06/19/22--received ativan 15 mg in last 8 hours with continued agitation -06/19/22--transfer to SDU and start precedex>>stabilized Plan to wean precedex in next 24 hours

## 2022-06-18 NOTE — Assessment & Plan Note (Signed)
Continue Protonix °

## 2022-06-18 NOTE — Assessment & Plan Note (Signed)
-   replete -mag 2.0

## 2022-06-18 NOTE — H&P (Signed)
History and Physical    Patient: Trevor [name] NUU:[id number] DOB: [birth year] DOA: 06/17/2022 DOS: the patient was seen and examined on 06/18/2022 PCP: Patient, No Pcp Per  Patient coming from: Home  Chief Complaint:  Chief Complaint  Patient presents with   Alcohol Problem   HPI: [name] is a 41 y.o. male with medical history significant of elevated liver enzymes, herpes simplex virus type II, hepatitis C, alcohol abuse, and more presents to ED with a chief complaint of alcohol withdrawal.  Patient reports he thinks he had a seizure.  He was walking to the kitchen when everything became foggy.  He woke up on the floor.  He had bit his tongue, and his left shoulder was sore.  He did not lose control of his bowel or bladder.  He reports that he was immediately oriented upon waking.  He thinks he was out for a few minutes.  This was unwitnessed.  Those few minutes may very well included a postictal state that he is unaware of.  Patient reports the last 3 months he has been drinking 3 bottles of wine per day.  His last drink was the morning of the 21st.  He has had associated nausea, vomiting, tremors, anxiety, and palpitations.  He denies hallucinations.  Patient reports he is never had a seizure before.  Per his chart he has had alcohol withdrawal delirium in the past.  He reports he is no longer on Wellbutrin.  Patient is ready to quit drinking.  Patient does not smoke, drinks alcohol as above, does not use illicit drugs, and is not vaccinated for COVID.  Patient is full code. Review of Systems: As mentioned in the history of present illness. All other systems reviewed and are negative. Past Medical History:  Diagnosis Date   Elevated liver enzymes    ETOH abuse    HSV-2 (herpes simplex virus 2) infection    Left upper chest wall; diagnosed by culture   Past Surgical History:  Procedure Laterality Date   broken arm     Left arm surgery     WISDOM TOOTH EXTRACTION     Social  History:  reports that he has never smoked. He has quit using smokeless tobacco.  His smokeless tobacco use included snuff. He reports current alcohol use of about 84.0 standard drinks of alcohol per week. He reports that he does not use drugs.  No Known Allergies  Family History  Problem Relation Age of Onset   Melanoma Mother    Cancer Mother        skin   Cancer Maternal Grandmother        lung   Cancer Maternal Grandfather        lung    Prior to Admission medications   Medication Sig Start Date End Date Taking? Authorizing Provider  acyclovir (ZOVIRAX) 400 MG tablet TAKE 1 TABLET BY MOUTH THREE TIMES DAILY FOR 5 DAYS Patient taking differently: Take 400 mg by mouth See admin instructions. Take 400 mg by mouth three times a day for 5 days as directed for flares 04/24/17   Sheliah Hatch, MD  buPROPion (WELLBUTRIN XL) 150 MG 24 hr tablet Take 1 tablet (150 mg total) by mouth daily. 08/18/19   Kuppelweiser, Cassie L, RPH-CPP  ibuprofen (ADVIL) 200 MG tablet Take 200-400 mg by mouth every 6 (six) hours as needed (for headaches or pain).    [provider]  Sofosbuvir-Velpatasvir (EPCLUSA) 400-100 MG TABS Take 1 tablet by  mouth daily. Take 1 tablet by mouth daily. 07/07/19   Veryl Speak, FNP  traZODone (DESYREL) 50 MG tablet Take 0.5-1 tablets (25-50 mg total) by mouth at bedtime as needed. for sleep 06/11/19   Leatha Gilding, MD    Physical Exam: Vitals:   06/17/22 2015 06/17/22 2130 06/17/22 2159 06/17/22 2300  BP:  (!) 131/93 (!) 130/96 (!) 135/94  Pulse: (!) 108 (!) 108 (!) 114 (!) 108  Resp: 19 20  20   Temp:   98.5 F (36.9 C) 98.4 F (36.9 C)  TempSrc:   Oral   SpO2: 98% 96% 96% 97%  Weight:    89.2 kg  Height:    5\' 9"  (1.753 m)   1.  General: Patient lying supine in bed,  no acute distress   2. Psychiatric: Alert and oriented x 3, mood and behavior normal for situation, pleasant and cooperative with exam   3. Neurologic: Speech and language  are normal, face is symmetric, moves all 4 extremities voluntarily, intermittent tremors of the upper extremities, otherwise at baseline without acute deficits on limited exam   4. HEENMT:  Head is atraumatic, normocephalic, pupils reactive to light, neck is supple, trachea is midline, mucous membranes are moist   5. Respiratory : Lungs are clear to auscultation bilaterally without wheezing, rhonchi, rales, no cyanosis, no increase in work of breathing or accessory muscle use   6. Cardiovascular : Heart rate tachycardic, rhythm is regular, no murmurs, rubs or gallops, no peripheral edema, peripheral pulses palpated   7. Gastrointestinal:  Abdomen is soft, nondistended, nontender to palpation bowel sounds active, no masses or organomegaly palpated   8. Skin:  Skin is warm, dry and intact without rashes, acute lesions, or ulcers on limited exam   9.Musculoskeletal:  No acute deformities or trauma, no asymmetry in tone, no peripheral edema, peripheral pulses palpated, no tenderness to palpation in the extremities  Data Reviewed: In the ED Patient is tachycardic at 103-137, tachypneic with a respiratory rate as high as 24 No leukocytosis, hemoglobin 17.5 Platelets 193 Chemistry reveals a hypokalemia and mild hyperglycemia Magnesium is within normal limits AST 206, ALT 270, T. bili 1.6 UDS with barbiturates, but we have been giving him phenobarbital Alcohol level is undetected CT head is normal EKG shows sinus tach with a QTc of 536 CIWA protocol started 60 mEq potassium given Admission requested for alcohol withdrawal  Assessment and Plan: * Alcohol withdrawal (HCC) -Last drink 36 hours ago -Had been drinking 3 bottles of wine per day -3 doses of phenobarbital in the ED -Start CIWA protocol -Advised on the importance of cessation -Continue to monitor  QT prolongation - Secondary to withdrawals and hypokalemia -Monitor on telemetry -Avoid QT prolonging agents when  possible  Seizure-like activity (HCC) - Unwitnessed - Bit tongue, no bowel or bladder incontinence - EEG - Consult neuro  Loss of consciousness (HCC) - Possible seizure -Reports being oriented immediately when he woke up -Unwitnessed -He did bite his tongue -No history of seizures -No longer taking Wellbutrin although it is on his med list -Hold Wellbutrin -Most likely that there was a seizure it was related to alcohol withdrawal, continue CIWA protocol -EEG in the a.m. -Consult neuro  Hypokalemia - Potassium 3.2 -Secondary to poor appetite over the past couple days due to withdrawal -60 mEq potassium in the ED -Trend in the a.m. -Monitor on telemetry -Likely related to QT prolongation  GERD (gastroesophageal reflux disease) - Continue Protonix  Transaminitis - Likely alcoholic  hepatitis - Right upper quadrant ultrasound - History of hepatitis C finished Epclusa - Continue to monitor  Anxiety state - Continue Ativan per CIWA protocol -Continue to reassure and monitor      Advance Care Planning:   Code Status: Full Code   Consults: Neurology  Family Communication: None at bedside  Severity of Illness: The appropriate patient status for this patient is OBSERVATION. Observation status is judged to be reasonable and necessary in order to provide the required intensity of service to ensure the patient's safety. The patient's presenting symptoms, physical exam findings, and initial radiographic and laboratory data in the context of their medical condition is felt to place them at decreased risk for further clinical deterioration. Furthermore, it is anticipated that the patient will be medically stable for discharge from the hospital within 2 midnights of admission.   Author: Lilyan Gilford, DO 06/18/2022 12:31 AM  For on call review [email or web address].

## 2022-06-18 NOTE — Progress Notes (Signed)
PROGRESS NOTE  Trevor [name] KGU:[id number] DOB: 06-Sep-1981 DOA: 06/17/2022 PCP: Patient, No Pcp Per  Brief History:  41 y.o. male with medical history significant of elevated liver enzymes, herpes simplex virus type II, hepatitis C, alcohol abuse, and more presents to ED with a chief complaint of alcohol withdrawal.  Patient reports he thinks he had a seizure.  He was walking to the kitchen when everything became foggy.  He woke up on the floor.  He had bit his tongue, and his left shoulder was sore.  He did not lose control of his bowel or bladder.  He reports that he was immediately oriented upon waking.  He thinks he was out for a few minutes.  This was unwitnessed.  Those few minutes may very well included a postictal state that he is unaware of.  Patient reports the last 3 months he has been drinking 3 bottles of wine per day.  His last drink was the morning of the 21st.  He has had associated nausea, vomiting, tremors, anxiety, and palpitations.  He denies hallucinations.  Patient reports he is never had a seizure before.  Per his chart he has had alcohol withdrawal delirium in the past.  He reports he is no longer on Wellbutrin.  Patient is ready to quit drinking.   Patient does not smoke, drinks alcohol as above, does not use illicit drugs, and is not vaccinated for COVID.  Patient is full code.  Assessment/Plan: Alcohol withdrawal seizure -last Etoh 36+ hours prior to admission -no further seizures during hospitalization -alcohol level neg in ED -discussed with pt he should not drive   Syncope -due to alcohol withdrawal seizure and orthostasis -orthostatics>>pulse increases from 93>>121 -Echo -CT brain neg -continue IVF   Hypokalemia -replete -mag 2.3   Transaminasemia -due to Etoh -trending down   Hyponatremia -due to volume depletion and poor solute intake -had 3-4 episodes emesis prior to admission -started on IV NS       Family Communication:  no  Family at bedside  Consultants:  none  Code Status:  FULL   DVT Prophylaxis:  [place] Heparin    Procedures: As Listed in Progress Note Above  Antibiotics: None      Subjective: Patient denies fevers, chills, headache, chest pain, dyspnea, nausea, vomiting, diarrhea, abdominal pain, dysuria, hematuria, hematochezia, and melena.   Objective: Vitals:   06/18/22 0626 06/18/22 0946 06/18/22 1223 06/18/22 1322  BP: 109/89 110/74 106/74 123/60  Pulse: (!) 105 (!) 104 93 (!) 55  Resp: 20 20  18   Temp: 98.2 F (36.8 C) 98.7 F (37.1 C)  97.8 F (36.6 C)  TempSrc: Oral Axillary  Oral  SpO2: 100% 98%  98%  Weight:      Height:        Intake/Output Summary (Last 24 hours) at 06/18/2022 1813 Last data filed at 06/18/2022 1300 Gross per 24 hour  Intake 1051.67 ml  Output --  Net 1051.67 ml   Weight change:  Exam:  General:  Pt is alert, follows commands appropriately, not in acute distress HEENT: No icterus, No thrush, No neck mass, [place]/AT Cardiovascular: RRR, S1/S2, no rubs, no gallops Respiratory: CTA bilaterally, no wheezing, no crackles, no rhonchi Abdomen: Soft/+BS, non tender, non distended, no guarding Extremities: No edema, No lymphangitis, No petechiae, No rashes, no synovitis   Data Reviewed: I have personally reviewed following labs and imaging studies Basic Metabolic Panel: Recent Labs  Lab 06/17/22  1536 06/17/22 1636 06/18/22 0404  NA 131*  --  131*  K 3.2*  --  3.1*  CL 94*  --  95*  CO2 24  --  28  GLUCOSE 175*  --  93  BUN 10  --  9  CREATININE 0.77  --  0.90  CALCIUM 10.0  --  8.6*  MG  --  2.3 2.3   Liver Function Tests: Recent Labs  Lab 06/17/22 1536 06/18/22 0404  AST 206* 134*  ALT 270* 184*  ALKPHOS 69 54  BILITOT 1.6* 1.5*  PROT 8.6* 6.9  ALBUMIN 5.0 3.9   No results for input(s): "LIPASE", "AMYLASE" in the last 168 hours. No results for input(s): "AMMONIA" in the last 168 hours. Coagulation Profile: No results for input(s):  "INR", "PROTIME" in the last 168 hours. CBC: Recent Labs  Lab 06/17/22 1536 06/18/22 0404  WBC 6.1 6.3  NEUTROABS  --  4.0  HGB 17.5* 14.8  HCT 48.3 43.0  MCV 90.4 93.9  PLT 193 149*   Cardiac Enzymes: No results for input(s): "CKTOTAL", "CKMB", "CKMBINDEX", "TROPONINI" in the last 168 hours. BNP: Invalid input(s): "POCBNP" CBG: No results for input(s): "GLUCAP" in the last 168 hours. HbA1C: No results for input(s): "HGBA1C" in the last 72 hours. Urine analysis:    Component Value Date/Time   COLORURINE AMBER (A) 06/18/2022 1606   APPEARANCEUR HAZY (A) 06/18/2022 1606   LABSPEC 1.009 06/18/2022 1606   PHURINE 6.0 06/18/2022 1606   GLUCOSEU NEGATIVE 06/18/2022 1606   HGBUR NEGATIVE 06/18/2022 1606   BILIRUBINUR NEGATIVE 06/18/2022 1606   BILIRUBINUR negative 08/25/2015 1008   KETONESUR 5 (A) 06/18/2022 1606   PROTEINUR NEGATIVE 06/18/2022 1606   UROBILINOGEN 0.2 08/25/2015 1008   NITRITE NEGATIVE 06/18/2022 1606   LEUKOCYTESUR NEGATIVE 06/18/2022 1606   Sepsis Labs: @LABRCNTIP (procalcitonin:4,lacticidven:4) )No results found for this or any previous visit (from the past 240 hour(s)).   Scheduled Meds:  folic acid  1 mg Oral Daily   heparin  5,000 Units Subcutaneous Q8H   multivitamin with minerals  1 tablet Oral Daily   pantoprazole  40 mg Oral Daily   thiamine  100 mg Oral Daily   Or   thiamine  100 mg Intravenous Daily   Continuous Infusions:  sodium chloride 100 mL/hr at 06/18/22 06/20/22    Procedures/Studies: 3299 Abdomen Limited RUQ (LIVER/GB)  Result Date: 06/18/2022 CLINICAL DATA:  Transaminitis.  History of hepatitis C. EXAM: ULTRASOUND ABDOMEN LIMITED RIGHT UPPER QUADRANT COMPARISON:  None Available. FINDINGS: Gallbladder: No gallstones or wall thickening visualized. No sonographic Murphy sign noted by sonographer. Common bile duct: Diameter: 6 mm Liver: Diffusely increased parenchymal echogenicity without a focal lesion identified. Portal vein is patent  on color Doppler imaging with normal direction of blood flow towards the liver. Other: None. IMPRESSION: 1. Echogenic liver, nonspecific but can be seen with steatosis, chronic hepatitis, and infiltrative processes. 2. No gallstones or biliary dilatation. Electronically Signed   By: 06/20/2022 M.D.   On: 06/18/2022 11:55   CT Head Wo Contrast  Result Date: 06/17/2022 CLINICAL DATA:  Altered mental status following alcohol abuse and a syncopal episode or seizure prior to admission. EXAM: CT HEAD WITHOUT CONTRAST TECHNIQUE: Contiguous axial images were obtained from the base of the skull through the vertex without intravenous contrast. RADIATION DOSE REDUCTION: This exam was performed according to the departmental dose-optimization program which includes automated exposure control, adjustment of the mA and/or kV according to patient size and/or use of iterative  reconstruction technique. COMPARISON:  None Available. FINDINGS: Brain: Normal appearing cerebral hemispheres and posterior fossa structures. Normal size and position of the ventricles. No intracranial hemorrhage, mass lesion or CT evidence of acute infarction. Vascular: No hyperdense vessel or unexpected calcification. Skull: Normal. Negative for fracture or focal lesion. Sinuses/Orbits: Unremarkable. Other: None. IMPRESSION: Normal examination. Electronically Signed   By: Beckie Salts M.D.   On: 06/17/2022 17:50    Catarina Hartshorn, DO  [facility]  If 7PM-7AM, please contact night-coverage [email or web address] Password [facility] 06/18/2022, 6:13 PM   LOS: 0 days

## 2022-06-18 NOTE — Assessment & Plan Note (Addendum)
Pt had alcohol withdrawal seizure -He did bite his tongue -No longer taking Wellbutrin although it is on his med list -EEG--no seizure -no seizure since admission -I have informed patient that he should not drive

## 2022-06-18 NOTE — Assessment & Plan Note (Signed)
-   Continue Ativan per CIWA protocol -Continue to reassure and monitor

## 2022-06-18 NOTE — Progress Notes (Signed)
EEG complete - results pending 

## 2022-06-18 NOTE — TOC Progression Note (Signed)
  Transition of Care [facility]) Screening Note   Patient Details  Name: TORI CUPPS Date of Birth: 05-29-1981   Transition of Care [facility]) CM/SW Contact:    Leitha Bleak, RN Phone Number: 06/18/2022, 2:26 PM    Transition of Care Department [facility]) has reviewed patient and no TOC needs have been identified at this time. We will continue to monitor patient advancement through interdisciplinary progression rounds. If new patient transition needs arise, please place a TOC consult.     Expected Discharge Plan: Home/Self Care Barriers to Discharge: No Barriers Identified  Expected Discharge Plan and Services Expected Discharge Plan: Home/Self Care

## 2022-06-19 ENCOUNTER — Telehealth: Payer: Self-pay

## 2022-06-19 DIAGNOSIS — E871 Hypo-osmolality and hyponatremia: Secondary | ICD-10-CM

## 2022-06-19 DIAGNOSIS — R7401 Elevation of levels of liver transaminase levels: Secondary | ICD-10-CM

## 2022-06-19 DIAGNOSIS — F10939 Alcohol use, unspecified with withdrawal, unspecified: Secondary | ICD-10-CM

## 2022-06-19 HISTORY — DX: Hypo-osmolality and hyponatremia: E87.1

## 2022-06-19 LAB — COMPREHENSIVE METABOLIC PANEL
ALT: 123 U/L — ABNORMAL HIGH (ref 0–44)
AST: 72 U/L — ABNORMAL HIGH (ref 15–41)
Albumin: 3.5 g/dL (ref 3.5–5.0)
Alkaline Phosphatase: 48 U/L (ref 38–126)
Anion gap: 7 (ref 5–15)
BUN: 8 mg/dL (ref 6–20)
CO2: 24 mmol/L (ref 22–32)
Calcium: 8.4 mg/dL — ABNORMAL LOW (ref 8.9–10.3)
Chloride: 103 mmol/L (ref 98–111)
Creatinine, Ser: 0.77 mg/dL (ref 0.61–1.24)
GFR, Estimated: >60 mL/min (ref >60)
Glucose, Bld: 105 mg/dL — ABNORMAL HIGH (ref 70–99)
Potassium: 3.1 mmol/L — ABNORMAL LOW (ref 3.5–5.1)
Sodium: 134 mmol/L — ABNORMAL LOW (ref 135–145)
Total Bilirubin: 0.9 mg/dL (ref 0.3–1.2)
Total Protein: 6.3 g/dL — ABNORMAL LOW (ref 6.5–8.1)

## 2022-06-19 LAB — GLUCOSE, CAPILLARY: Glucose-Capillary: 146 mg/dL — ABNORMAL HIGH (ref 70–99)

## 2022-06-19 LAB — MAGNESIUM: Magnesium: 2 mg/dL (ref 1.7–2.4)

## 2022-06-19 MED ORDER — ENOXAPARIN SODIUM 40 MG/0.4ML IJ SOSY
40.0000 mg | PREFILLED_SYRINGE | Freq: Every day | INTRAMUSCULAR | Status: DC
  Administered 2022-06-19 – 2022-06-22 (×4): 40 mg via SUBCUTANEOUS
  Filled 2022-06-19 (×4): qty 0.4

## 2022-06-19 MED ORDER — CHLORHEXIDINE GLUCONATE CLOTH 2 % EX PADS
6.0000 | MEDICATED_PAD | Freq: Every day | CUTANEOUS | Status: DC
  Administered 2022-06-19 – 2022-06-20 (×2): 6 via TOPICAL

## 2022-06-19 MED ORDER — DEXMEDETOMIDINE HCL IN NACL 200 MCG/50ML IV SOLN
0.4000 ug/kg/h | INTRAVENOUS | Status: DC
  Filled 2022-06-19: qty 50

## 2022-06-19 MED ORDER — DEXMEDETOMIDINE HCL IN NACL 400 MCG/100ML IV SOLN
0.4000 ug/kg/h | INTRAVENOUS | Status: DC
  Administered 2022-06-19: 0.4 ug/kg/h via INTRAVENOUS
  Administered 2022-06-19: 0.404 ug/kg/h via INTRAVENOUS
  Administered 2022-06-20 (×2): 0.4 ug/kg/h via INTRAVENOUS
  Administered 2022-06-21: 0.5 ug/kg/h via INTRAVENOUS
  Administered 2022-06-21: 0.4 ug/kg/h via INTRAVENOUS
  Filled 2022-06-19 (×7): qty 100

## 2022-06-19 MED ORDER — DEXMEDETOMIDINE HCL IN NACL 200 MCG/50ML IV SOLN: 0.4000 ug/kg/h | INTRAVENOUS | Status: DC

## 2022-06-19 MED ORDER — POTASSIUM CHLORIDE CRYS ER 20 MEQ PO TBCR
40.0000 meq | EXTENDED_RELEASE_TABLET | Freq: Once | ORAL | Status: AC
  Administered 2022-06-19: 40 meq via ORAL
  Filled 2022-06-19: qty 2

## 2022-06-19 NOTE — Telephone Encounter (Signed)
Received a referral from [place] at [facility]. Patient has no insurance or PCP. I will follow up with patient to conduct a phone screening for Care Connect program once patient is discharged.

## 2022-06-19 NOTE — Progress Notes (Signed)
Called to patient's room. Patient was redirected to bed, bed alarm placed on.  Patient stated that this place looked familiar but he was not sure where he was. He could not tell me why he was here but he could tell me his name and date of birth. This was a new orientation change for this patient this shift.

## 2022-06-19 NOTE — Assessment & Plan Note (Addendum)
-  due to volume depletion and poor solute intake -had 3-4 episodes emesis prior to admission -started on IV NS>>improved

## 2022-06-19 NOTE — Procedures (Signed)
Patient Name: Trevor [name]  MRN: [id number]  Epilepsy Attending: Charlsie Quest  Referring Physician/Provider: Lilyan Gilford, DO  Date: 06/18/2022  Duration: 22.59 mins  Patient history: 41 year old male with seizure in the setting of alcohol withdrawal.  EEG to evaluate for seizure.  Level of alertness: Awake  AEDs during EEG study: Ativan  Technical aspects: This EEG study was done with scalp electrodes positioned according to the 10-20 International system of electrode placement. Electrical activity was reviewed with band pass filter of 1-70Hz , sensitivity of 7 uV/mm, display speed of 49mm/sec with a 60Hz  notched filter applied as appropriate. EEG data were recorded continuously and digitally stored.  Video monitoring was available and reviewed as appropriate.  Description: The posterior dominant rhythm consists of 8 Hz activity of moderate voltage (25-35 uV) seen predominantly in posterior head regions, symmetric and reactive to eye opening and eye closing. Hyperventilation and photic stimulation were not performed.     IMPRESSION: This study is within normal limits. No seizures or epileptiform discharges were seen throughout the recording.  A normal interictal EEG does not exclude nor support the diagnosis of epilepsy.   Lavaun Greenfield 

## 2022-06-19 NOTE — Progress Notes (Signed)
Despite several doses of Ativan tonight, patient continues to try and get out of bed, he has pulled out 3 Ivs, will not keep his tele monitor on, remains confused but able to reorient.

## 2022-06-19 NOTE — TOC Initial Note (Signed)
Transition of Care [facility]) - Initial/Assessment Note    Patient Details  Name: Trevor [name] MRN: [id number] Date of Birth: May 06, 1981  Transition of Care [facility]) CM/SW Contact:    Leitha Bleak, RN Phone Number: 06/19/2022, 3:25 PM  Clinical Narrative:        Patient admitted with  Alcohol Withdrawal, now transferred to the unit. TOC consulted for Substance abuse resources. Patient is sleeping. Father at the bedside, RN called for [facility] to meet with his father.  Substance abuse resources provided and discussed. Per Father patient will lose his job, car and apartment after this hospital visit. Patient drinks 3 bottles of wine a day for the past 10 weeks. Father will take patient back to his home in [place], but this will short term. Patients mother lives in [place] and will no longer provide assistance. Father agreed to Care Connect program, referral sent and CC card provided.   TOC called Jerene Dilling, Firefighter, she is looking into [employer] and will contact patients father.       Expected Discharge Plan: Home/Self Care Barriers to Discharge: Continued Medical Work up   Patient Goals and CMS Choice   CMS Medicare.gov Compare Post Acute Care list provided to:: Patient Choice offered to / list presented to : Patient  Expected Discharge Plan and Services Expected Discharge Plan: Home/Self Care     Living arrangements for the past 2 months: Apartment                  Prior Living Arrangements/Services Living arrangements for the past 2 months: Apartment Lives with:: Self   Activities of Daily Living Home Assistive Devices/Equipment: None ADL Screening (condition at time of admission) Patient's cognitive ability adequate to safely complete daily activities?: Yes Is the patient deaf or have difficulty hearing?: No Does the patient have difficulty seeing, even when wearing glasses/contacts?: No Does the patient have difficulty concentrating, remembering, or making  decisions?: No Patient able to express need for assistance with ADLs?: Yes Does the patient have difficulty dressing or bathing?: No Independently performs ADLs?: Yes (appropriate for developmental age) Does the patient have difficulty walking or climbing stairs?: No Weakness of Legs: None Weakness of Arms/Hands: None  Permission Sought/Granted  Emotional Assessment      Orientation: : Oriented to Self Alcohol / Substance Use: Alcohol Use    Admission diagnosis:  Alcohol withdrawal (HCC) [F10.939] Patient Active Problem List   Diagnosis Date Noted   Hyponatremia 06/19/2022   Transaminitis 06/18/2022   GERD (gastroesophageal reflux disease) 06/18/2022   Hypokalemia 06/18/2022   Loss of consciousness (HCC) 06/18/2022   QT prolongation 06/18/2022   Chronic hepatitis C without hepatic coma (HCC) 06/28/2019   Alcohol abuse 06/28/2019   Psoriasis 06/07/2019   Suicidal ideation 06/07/2019   Withdrawal symptoms, alcohol, with delirium (HCC) 06/07/2019   Alcoholic hepatitis without ascites 06/05/2019   Alcohol withdrawal (HCC) 06/03/2019   Obesity (BMI 30-39.9) 01/09/2016   HSV-2 (herpes simplex virus 2) infection    PCP:  Patient, No Pcp Per Pharmacy:   [facility] [phone] [place] Otto, [place] - [address] [place] [address] Phone: [phone] Fax: [phone]  [facility] [address] [place] [address] Phone: [phone] Fax: [phone]   Readmission Risk Interventions    06/19/2022    3:24 PM  Readmission Risk Prevention Plan  Medication Screening Complete  Transportation Screening Complete

## 2022-06-19 NOTE — Progress Notes (Addendum)
PROGRESS NOTE  Trevor [name] NLZ:[id number] DOB: 04-02-1981 DOA: 06/17/2022 PCP: Patient, No Pcp Per  Brief History:  41 y.o. male with medical history significant of elevated liver enzymes, herpes simplex virus type II, hepatitis C, alcohol abuse, and more presents to ED with a chief complaint of alcohol withdrawal.  Patient reports he thinks he had a seizure.  He was walking to the kitchen when everything became foggy.  He woke up on the floor.  He had bit his tongue, and his left shoulder was sore.  He did not lose control of his bowel or bladder.  He reports that he was immediately oriented upon waking.  He thinks he was out for a few minutes.  This was unwitnessed.  Those few minutes may very well included a postictal state that he is unaware of.  Patient reports the last 3 months he has been drinking 3 bottles of wine per day.  His last drink was the morning of the 21st.  He has had associated nausea, vomiting, tremors, anxiety, and palpitations.  He denies hallucinations.  Patient reports he is never had a seizure before.  Per his chart he has had alcohol withdrawal delirium in the past.  He reports he is no longer on Wellbutrin.  Patient is ready to quit drinking.   Patient does not smoke, drinks alcohol as above, does not use illicit drugs, and is not vaccinated for COVID.  Patient is full code.  In the early am 06/19/22, patient developing worsening agitation, pulling out IVs with CIWA score in 20s.  He was transferred to SDU and started on precedex.  Assessment and Plan: * Alcohol withdrawal (HCC) -DELIRIUM TREMENS -Last drink 36 hours PTA -Had been drinking 3 bottles of wine per day -3 doses of phenobarbital in the ED -06/19/22--received ativan 15 mg in last 8 hours with continued agitation -06/19/22--transfer to SDU and start precedex  Hyponatremia -due to volume depletion and poor solute intake -had 3-4 episodes emesis prior to admission -started on IV NS>>improving     QT prolongation - Secondary to withdrawals and hypokalemia -Monitor on telemetry -Avoid QT prolonging agents when possible  Loss of consciousness (HCC) Pt had alcohol withdrawal seizure -He did bite his tongue -No longer taking Wellbutrin although it is on his med list -EEG--no seizure  Hypokalemia - replete -mag 2.0  GERD (gastroesophageal reflux disease) - Continue Protonix  Transaminitis - Likely alcoholic hepatitis and ASH - Right upper quadrant ultrasound--echogenic liver - History of hepatitis C finished Epclusa - Continue to monitor>>trending down    Family Communication:   no Family at bedside  Consultants:  none  Code Status:  FULL   DVT Prophylaxis:   Heparin   Procedures: As Listed in Progress Note Above  Antibiotics: None   Subjective: Patient denies fevers, chills, headache, chest pain, dyspnea, nausea, vomiting, diarrhea, abdominal pain   Objective: Vitals:   06/18/22 1322 06/18/22 2047 06/19/22 0230 06/19/22 0947  BP: 123/60 113/83 120/85 (!) 125/95  Pulse: (!) 55 98 91   Resp: 18 20 (!) 22   Temp: 97.8 F (36.6 C) 98.7 F (37.1 C) 98.2 F (36.8 C)   TempSrc: Oral Oral    SpO2: 98% 96% 98%   Weight:      Height:        Intake/Output Summary (Last 24 hours) at 06/19/2022 1021 Last data filed at 06/19/2022 0941 Gross per 24 hour  Intake 720 ml  Output --  Net 720 ml   Weight change:  Exam:  General:  Pt is alert, follows commands appropriately, confused, intermittent agitation HEENT: No icterus, No thrush, No neck mass, [place]/AT Cardiovascular: RRR, S1/S2, no rubs, no gallops Respiratory: CTA bilaterally, no wheezing, no crackles, no rhonchi Abdomen: Soft/+BS, non tender, non distended, no guarding Extremities: No edema, No lymphangitis, No petechiae, No rashes, no synovitis   Data Reviewed: I have personally reviewed following labs and imaging studies Basic Metabolic Panel: Recent Labs  Lab 06/17/22 1536  06/17/22 1636 06/18/22 0404 06/19/22 0344  NA 131*  --  131* 134*  K 3.2*  --  3.1* 3.1*  CL 94*  --  95* 103  CO2 24  --  28 24  GLUCOSE 175*  --  93 105*  BUN 10  --  9 8  CREATININE 0.77  --  0.90 0.77  CALCIUM 10.0  --  8.6* 8.4*  MG  --  2.3 2.3 2.0   Liver Function Tests: Recent Labs  Lab 06/17/22 1536 06/18/22 0404 06/19/22 0344  AST 206* 134* 72*  ALT 270* 184* 123*  ALKPHOS 69 54 48  BILITOT 1.6* 1.5* 0.9  PROT 8.6* 6.9 6.3*  ALBUMIN 5.0 3.9 3.5   No results for input(s): "LIPASE", "AMYLASE" in the last 168 hours. No results for input(s): "AMMONIA" in the last 168 hours. Coagulation Profile: No results for input(s): "INR", "PROTIME" in the last 168 hours. CBC: Recent Labs  Lab 06/17/22 1536 06/18/22 0404  WBC 6.1 6.3  NEUTROABS  --  4.0  HGB 17.5* 14.8  HCT 48.3 43.0  MCV 90.4 93.9  PLT 193 149*   Cardiac Enzymes: No results for input(s): "CKTOTAL", "CKMB", "CKMBINDEX", "TROPONINI" in the last 168 hours. BNP: Invalid input(s): "POCBNP" CBG: Recent Labs  Lab 06/18/22 2049 06/19/22 0804  GLUCAP 127* 146*   HbA1C: No results for input(s): "HGBA1C" in the last 72 hours. Urine analysis:    Component Value Date/Time   COLORURINE AMBER (A) 06/18/2022 1606   APPEARANCEUR HAZY (A) 06/18/2022 1606   LABSPEC 1.009 06/18/2022 1606   PHURINE 6.0 06/18/2022 1606   GLUCOSEU NEGATIVE 06/18/2022 1606   HGBUR NEGATIVE 06/18/2022 1606   BILIRUBINUR NEGATIVE 06/18/2022 1606   BILIRUBINUR negative 08/25/2015 1008   KETONESUR 5 (A) 06/18/2022 1606   PROTEINUR NEGATIVE 06/18/2022 1606   UROBILINOGEN 0.2 08/25/2015 1008   NITRITE NEGATIVE 06/18/2022 1606   LEUKOCYTESUR NEGATIVE 06/18/2022 1606   Sepsis Labs: @LABRCNTIP (procalcitonin:4,lacticidven:4) )No results found for this or any previous visit (from the past 240 hour(s)).   Scheduled Meds:  folic acid  1 mg Oral Daily   heparin  5,000 Units Subcutaneous Q8H   multivitamin with minerals  1 tablet  Oral Daily   pantoprazole  40 mg Oral Daily   potassium chloride  40 mEq Oral Once   thiamine  100 mg Oral Daily   Or   thiamine  100 mg Intravenous Daily   Continuous Infusions:  dexmedetomidine (PRECEDEX) IV infusion      Procedures/Studies: EEG adult  Result Date: 06/19/2022 Charlsie QuestYadav, Priyanka O, MD     06/19/2022  8:25 AM Patient Name: Fernande BoydenWilliam R Delmonaco MRN: [id number] Epilepsy Attending: Charlsie QuestPriyanka O Yadav Referring Physician/Provider: Lilyan GilfordZierle-Ghosh, Asia B, DO Date: 06/18/2022 Duration: 22.59 mins Patient history: 41 year old male with seizure in the setting of alcohol withdrawal.  EEG to evaluate for seizure. Level of alertness: Awake AEDs during EEG study: Ativan Technical aspects: This EEG study was done with scalp electrodes positioned according to  the 10-20 International system of electrode placement. Electrical activity was reviewed with band pass filter of 1-70Hz , sensitivity of 7 uV/mm, display speed of 34mm/sec with a 60Hz  notched filter applied as appropriate. EEG data were recorded continuously and digitally stored.  Video monitoring was available and reviewed as appropriate. Description: The posterior dominant rhythm consists of 8 Hz activity of moderate voltage (25-35 uV) seen predominantly in posterior head regions, symmetric and reactive to eye opening and eye closing. Hyperventilation and photic stimulation were not performed.   IMPRESSION: This study is within normal limits. No seizures or epileptiform discharges were seen throughout the recording. A normal interictal EEG does not exclude nor support the diagnosis of epilepsy.   ECHOCARDIOGRAM COMPLETE  Result Date: 06/18/2022    ECHOCARDIOGRAM REPORT   Patient Name:   JASIN BRAZEL Date of Exam: 06/18/2022 Medical Rec #:  06/20/2022         Height:       69.0 in Accession #:    [id number]        Weight:       196.6 lb Date of Birth:  10-04-1981         BSA:          2.051 m Patient Age:    41 years          BP:            106/76 mmHg Patient Gender: M                 HR:           98 bpm. Exam Location:  04/16/1981 Procedure: 2D Echo, Cardiac Doppler and Color Doppler Indications:    Syncope  History:        Patient has no prior history of Echocardiogram examinations.                 Signs/Symptoms:Syncope. Etoh abuse.  Sonographer:    Jeani Hawking Referring Phys: [phone] Lani Havlik IMPRESSIONS  1. Left ventricular ejection fraction, by estimation, is 55 to 60%. The left ventricle has normal function. The left ventricle has no regional wall motion abnormalities. Left ventricular diastolic parameters were normal.  2. Right ventricular systolic function is normal. The right ventricular size is mildly enlarged. Tricuspid regurgitation signal is inadequate for assessing PA pressure.  3. The mitral valve is grossly normal. Trivial mitral valve regurgitation. No evidence of mitral stenosis.  4. The aortic valve is tricuspid. Aortic valve regurgitation is not visualized. No aortic stenosis is present.  5. The inferior vena cava is normal in size with greater than 50% respiratory variability, suggesting right atrial pressure of 3 mmHg. Conclusion(s)/Recommendation(s): Normal biventricular function without evidence of hemodynamically significant valvular heart disease. FINDINGS  Left Ventricle: Left ventricular ejection fraction, by estimation, is 55 to 60%. The left ventricle has normal function. The left ventricle has no regional wall motion abnormalities. The left ventricular internal cavity size was normal in size. There is  no left ventricular hypertrophy. Left ventricular diastolic parameters were normal. Right Ventricle: The right ventricular size is mildly enlarged. No increase in right ventricular wall thickness. Right ventricular systolic function is normal. Tricuspid regurgitation signal is inadequate for assessing PA pressure. Left Atrium: Left atrial size was normal in size. Right Atrium: Right atrial size was normal in  size. Pericardium: There is no evidence of pericardial effusion. Mitral Valve: The mitral valve is grossly normal. Trivial mitral valve regurgitation. No evidence of mitral valve  stenosis. MV peak gradient, 1.0 mmHg. The mean mitral valve gradient is 0.0 mmHg. Tricuspid Valve: The tricuspid valve is grossly normal. Tricuspid valve regurgitation is trivial. No evidence of tricuspid stenosis. Aortic Valve: The aortic valve is tricuspid. Aortic valve regurgitation is not visualized. No aortic stenosis is present. Aortic valve mean gradient measures 2.0 mmHg. Aortic valve peak gradient measures 3.7 mmHg. Aortic valve area, by VTI measures 4.34 cm. Pulmonic Valve: The pulmonic valve was grossly normal. Pulmonic valve regurgitation is trivial. No evidence of pulmonic stenosis. Aorta: The aortic root is normal in size and structure. Venous: The inferior vena cava is normal in size with greater than 50% respiratory variability, suggesting right atrial pressure of 3 mmHg. IAS/Shunts: The atrial septum is grossly normal.  LEFT VENTRICLE PLAX 2D LVIDd:         4.90 cm     Diastology LVIDs:         3.10 cm     LV e' medial:    7.94 cm/s LV PW:         1.10 cm     LV E/e' medial:  5.1 LV IVS:        1.10 cm     LV e' lateral:   7.29 cm/s LVOT diam:     2.30 cm     LV E/e' lateral: 5.6 LV SV:         69 LV SV Index:   34 LVOT Area:     4.15 cm  LV Volumes (MOD) LV vol d, MOD A2C: 84.3 ml LV vol d, MOD A4C: 85.2 ml LV vol s, MOD A2C: 43.2 ml LV vol s, MOD A4C: 41.6 ml LV SV MOD A2C:     41.1 ml LV SV MOD A4C:     85.2 ml LV SV MOD BP:      44.2 ml RIGHT VENTRICLE RV Basal diam:  3.70 cm RV Mid diam:    3.80 cm RV S prime:     10.40 cm/s TAPSE (M-mode): 1.8 cm LEFT ATRIUM             Index        RIGHT ATRIUM           Index LA diam:        3.80 cm 1.85 cm/m   RA Area:     17.30 cm LA Vol (A2C):   46.6 ml 22.72 ml/m  RA Volume:   47.60 ml  23.21 ml/m LA Vol (A4C):   41.5 ml 20.23 ml/m LA Biplane Vol: 46.6 ml 22.72 ml/m   AORTIC VALVE                    PULMONIC VALVE AV Area (Vmax):    4.03 cm     PV Vmax:       0.58 m/s AV Area (Vmean):   3.00 cm     PV Peak grad:  1.4 mmHg AV Area (VTI):     4.34 cm AV Vmax:           95.70 cm/s AV Vmean:          70.300 cm/s AV VTI:            0.160 m AV Peak Grad:      3.7 mmHg AV Mean Grad:      2.0 mmHg LVOT Vmax:         92.90 cm/s LVOT Vmean:        50.700 cm/s  LVOT VTI:          0.167 m LVOT/AV VTI ratio: 1.04  AORTA Ao Root diam: 3.80 cm MITRAL VALVE MV Area (PHT): 4.80 cm    SHUNTS MV Area VTI:   5.02 cm    Systemic VTI:  0.17 m MV Peak grad:  1.0 mmHg    Systemic Diam: 2.30 cm MV Mean grad:  0.0 mmHg MV Vmax:       0.51 m/s MV Vmean:      33.3 cm/s MV Decel Time: 158 msec MV E velocity: 40.60 cm/s MV A velocity: 46.80 cm/s MV E/A ratio:  0.87 Lennie Odor MD Electronically signed by Lennie Odor MD Signature Date/Time: 06/18/2022/3:44:41 PM    Final    US Abdomen Limited RUQ (LIVER/GB)  Result Date: 06/18/2022 CLINICAL DATA:  Transaminitis.  History of hepatitis C. EXAM: ULTRASOUND ABDOMEN LIMITED RIGHT UPPER QUADRANT COMPARISON:  None Available. FINDINGS: Gallbladder: No gallstones or wall thickening visualized. No sonographic Murphy sign noted by sonographer. Common bile duct: Diameter: 6 mm Liver: Diffusely increased parenchymal echogenicity without a focal lesion identified. Portal vein is patent on color Doppler imaging with normal direction of blood flow towards the liver. Other: None. IMPRESSION: 1. Echogenic liver, nonspecific but can be seen with steatosis, chronic hepatitis, and infiltrative processes. 2. No gallstones or biliary dilatation. Electronically Signed   By: Sebastian Ache M.D.   On: 06/18/2022 11:55   CT Head Wo Contrast  Result Date: 06/17/2022 CLINICAL DATA:  Altered mental status following alcohol abuse and a syncopal episode or seizure prior to admission. EXAM: CT HEAD WITHOUT CONTRAST TECHNIQUE: Contiguous axial images were obtained from the base of  the skull through the vertex without intravenous contrast. RADIATION DOSE REDUCTION: This exam was performed according to the departmental dose-optimization program which includes automated exposure control, adjustment of the mA and/or kV according to patient size and/or use of iterative reconstruction technique. COMPARISON:  None Available. FINDINGS: Brain: Normal appearing cerebral hemispheres and posterior fossa structures. Normal size and position of the ventricles. No intracranial hemorrhage, mass lesion or CT evidence of acute infarction. Vascular: No hyperdense vessel or unexpected calcification. Skull: Normal. Negative for fracture or focal lesion. Sinuses/Orbits: Unremarkable. Other: None. IMPRESSION: Normal examination. Electronically Signed   By: Beckie Salts M.D.   On: 06/17/2022 17:50    Catarina Hartshorn, DO  [facility]  If 7PM-7AM, please contact night-coverage [email or web address] Password [email or web address] 06/19/2022, 10:21 AM   LOS: 1 day

## 2022-06-20 LAB — COMPREHENSIVE METABOLIC PANEL
ALT: 120 U/L — ABNORMAL HIGH (ref 0–44)
AST: 70 U/L — ABNORMAL HIGH (ref 15–41)
Albumin: 3.8 g/dL (ref 3.5–5.0)
Alkaline Phosphatase: 56 U/L (ref 38–126)
Anion gap: 9 (ref 5–15)
BUN: 9 mg/dL (ref 6–20)
CO2: 24 mmol/L (ref 22–32)
Calcium: 9.1 mg/dL (ref 8.9–10.3)
Chloride: 105 mmol/L (ref 98–111)
Creatinine, Ser: 0.63 mg/dL (ref 0.61–1.24)
GFR, Estimated: >60 mL/min (ref >60)
Glucose, Bld: 100 mg/dL — ABNORMAL HIGH (ref 70–99)
Potassium: 3.4 mmol/L — ABNORMAL LOW (ref 3.5–5.1)
Sodium: 138 mmol/L (ref 135–145)
Total Bilirubin: 0.7 mg/dL (ref 0.3–1.2)
Total Protein: 7.2 g/dL (ref 6.5–8.1)

## 2022-06-20 LAB — MAGNESIUM: Magnesium: 1.9 mg/dL (ref 1.7–2.4)

## 2022-06-20 LAB — GLUCOSE, CAPILLARY: Glucose-Capillary: 96 mg/dL (ref 70–99)

## 2022-06-20 MED ORDER — PANTOPRAZOLE SODIUM 40 MG PO TBEC
40.0000 mg | DELAYED_RELEASE_TABLET | Freq: Every day | ORAL | Status: DC
  Administered 2022-06-20 – 2022-06-23 (×4): 40 mg via ORAL
  Filled 2022-06-20 (×4): qty 1

## 2022-06-20 MED ORDER — POTASSIUM CHLORIDE CRYS ER 20 MEQ PO TBCR
20.0000 meq | EXTENDED_RELEASE_TABLET | Freq: Once | ORAL | Status: AC
  Administered 2022-06-20: 20 meq via ORAL
  Filled 2022-06-20: qty 1

## 2022-06-20 NOTE — Progress Notes (Signed)
PROGRESS NOTE  Trevor [name] UJW:[id number] DOB: 07/18/1981 DOA: 06/17/2022 PCP: Patient, No Pcp Per  Brief History:  41 y.o. male with medical history significant of elevated liver enzymes, herpes simplex virus type II, hepatitis C, alcohol abuse, and more presents to ED with a chief complaint of alcohol withdrawal.  Patient reports he thinks he had a seizure.  He was walking to the kitchen when everything became foggy.  He woke up on the floor.  He had bit his tongue, and his left shoulder was sore.  He did not lose control of his bowel or bladder.  He reports that he was immediately oriented upon waking.  He thinks he was out for a few minutes.  This was unwitnessed.  Those few minutes may very well included a postictal state that he is unaware of.  Patient reports the last 3 months he has been drinking 3 bottles of wine per day.  His last drink was the morning of the 21st.  He has had associated nausea, vomiting, tremors, anxiety, and palpitations.  He denies hallucinations.  Patient reports he is never had a seizure before.  Per his chart he has had alcohol withdrawal delirium in the past.  He reports he is no longer on Wellbutrin.  Patient is ready to quit drinking.   Patient does not smoke, drinks alcohol as above, does not use illicit drugs, and is not vaccinated for COVID.  Patient is full code.   In the early am 06/19/22, patient developing worsening agitation, pulling out IVs with CIWA score in 20s.  He was transferred to SDU and started on precedex  Assessment and Plan: * Alcohol withdrawal (HCC) -DELIRIUM TREMENS -Last drink 36 hours PTA -Had been drinking 3 bottles of wine per day -3 doses of phenobarbital in the ED -06/19/22--received ativan 15 mg in last 8 hours with continued agitation -06/19/22--transfer to SDU and start precedex>>stabilized Plan to wean precedex in next 24 hours  Hyponatremia -due to volume depletion and poor solute intake -had 3-4 episodes  emesis prior to admission -started on IV NS>>improving    QT prolongation - Secondary to withdrawals and hypokalemia -Monitor on telemetry -Avoid QT prolonging agents when possible  Loss of consciousness (HCC) Pt had alcohol withdrawal seizure -He did bite his tongue -No longer taking Wellbutrin although it is on his med list -EEG--no seizure -no seizure since admission  Hypokalemia - replete -mag 2.0  GERD (gastroesophageal reflux disease) - Continue Protonix  Transaminitis - Likely alcoholic hepatitis and ASH - Right upper quadrant ultrasound--echogenic liver - History of hepatitis C finished Epclusa - Continue to monitor>>trending down    Family Communication:  Father updated 06/19/22  Consultants:  none  Code Status:  FULL   DVT Prophylaxis:  [place] Heparin    Procedures: As Listed in Progress Note Above  Antibiotics: None      Subjective: Patient denies fevers, chills, headache, chest pain, dyspnea, nausea, vomiting, diarrhea, abdominal pain, dysuria, hematuria, hematochezia, and melena.   Objective: Vitals:   06/20/22 0900 06/20/22 1000 06/20/22 1146 06/20/22 1200  BP: 103/68 126/86  104/81  Pulse:  (!) 58  78  Resp: (!) 23 18  (!) 21  Temp:   97.6 F (36.4 C)   TempSrc:   Oral   SpO2:  97%  97%  Weight:      Height:        Intake/Output Summary (Last 24 hours) at 06/20/2022 1351 Last data  filed at 06/20/2022 0313 Gross per 24 hour  Intake 280.06 ml  Output 1 ml  Net 279.06 ml   Weight change:  Exam:  General:  Pt is alert, follows commands appropriately, not in acute distress HEENT: No icterus, No thrush, No neck mass, [place]/AT Cardiovascular: RRR, S1/S2, no rubs, no gallops Respiratory: CTA bilaterally, no wheezing, no crackles, no rhonchi Abdomen: Soft/+BS, non tender, non distended, no guarding Extremities: No edema, No lymphangitis, No petechiae, No rashes, no synovitis   Data Reviewed: I have personally reviewed following labs  and imaging studies Basic Metabolic Panel: Recent Labs  Lab 06/17/22 1536 06/17/22 1636 06/18/22 0404 07/02/22 0344 06/20/22 0556  NA 131*  --  131* 134* 138  K 3.2*  --  3.1* 3.1* 3.4*  CL 94*  --  95* 103 105  CO2 24  --  28 24 24   GLUCOSE 175*  --  93 105* 100*  BUN 10  --  9 8 9   CREATININE 0.77  --  0.90 0.77 0.63  CALCIUM 10.0  --  8.6* 8.4* 9.1  MG  --  2.3 2.3 2.0 1.9   Liver Function Tests: Recent Labs  Lab 06/17/22 1536 06/18/22 0404 2022/07/02 0344 06/20/22 0556  AST 206* 134* 72* 70*  ALT 270* 184* 123* 120*  ALKPHOS 69 54 48 56  BILITOT 1.6* 1.5* 0.9 0.7  PROT 8.6* 6.9 6.3* 7.2  ALBUMIN 5.0 3.9 3.5 3.8   No results for input(s): "LIPASE", "AMYLASE" in the last 168 hours. No results for input(s): "AMMONIA" in the last 168 hours. Coagulation Profile: No results for input(s): "INR", "PROTIME" in the last 168 hours. CBC: Recent Labs  Lab 06/17/22 1536 06/18/22 0404  WBC 6.1 6.3  NEUTROABS  --  4.0  HGB 17.5* 14.8  HCT 48.3 43.0  MCV 90.4 93.9  PLT 193 149*   Cardiac Enzymes: No results for input(s): "CKTOTAL", "CKMB", "CKMBINDEX", "TROPONINI" in the last 168 hours. BNP: Invalid input(s): "POCBNP" CBG: Recent Labs  Lab 06/18/22 2049 July 02, 2022 0804 06/20/22 0805  GLUCAP 127* 146* 96   HbA1C: No results for input(s): "HGBA1C" in the last 72 hours. Urine analysis:    Component Value Date/Time   COLORURINE AMBER (A) 06/18/2022 1606   APPEARANCEUR HAZY (A) 06/18/2022 1606   LABSPEC 1.009 06/18/2022 1606   PHURINE 6.0 06/18/2022 1606   GLUCOSEU NEGATIVE 06/18/2022 1606   HGBUR NEGATIVE 06/18/2022 1606   BILIRUBINUR NEGATIVE 06/18/2022 1606   BILIRUBINUR negative 08/25/2015 1008   KETONESUR 5 (A) 06/18/2022 1606   PROTEINUR NEGATIVE 06/18/2022 1606   UROBILINOGEN 0.2 08/25/2015 1008   NITRITE NEGATIVE 06/18/2022 1606   LEUKOCYTESUR NEGATIVE 06/18/2022 1606   Sepsis Labs: @LABRCNTIP (procalcitonin:4,lacticidven:4) )No results found for  this or any previous visit (from the past 240 hour(s)).   Scheduled Meds:  Chlorhexidine Gluconate Cloth  6 each Topical Daily   enoxaparin (LOVENOX) injection  40 mg Subcutaneous QHS   pantoprazole  40 mg Oral Daily   potassium chloride  20 mEq Oral Once   Continuous Infusions:  dexmedetomidine 0.5 mcg/kg/hr (06/20/22 0937)    Procedures/Studies: EEG adult  Result Date: 2022/07/02 , MD     02-Jul-2022  8:25 AM Patient Name: Trevor [name] MRN: [facility]ls[facility] Epilepsy Attending: 06/21/2022 Referring Physician/Provider: Fernande Boyden, DO Date: 06/18/2022 Duration: 22.59 mins Patient history: 41 year old male with seizure in the setting of alcohol withdrawal.  EEG to evaluate for seizure. Level of alertness: Awake AEDs during EEG study: Ativan  Technical aspects: This EEG study was done with scalp electrodes positioned according to the 10-20 International system of electrode placement. Electrical activity was reviewed with band pass filter of 1-70Hz , sensitivity of 7 uV/mm, display speed of 1330mm/sec with a 60Hz  notched filter applied as appropriate. EEG data were recorded continuously and digitally stored.  Video monitoring was available and reviewed as appropriate. Description: The posterior dominant rhythm consists of 8 Hz activity of moderate voltage (25-35 uV) seen predominantly in posterior head regions, symmetric and reactive to eye opening and eye closing. Hyperventilation and photic stimulation were not performed.   IMPRESSION: This study is within normal limits. No seizures or epileptiform discharges were seen throughout the recording. A normal interictal EEG does not exclude nor support the diagnosis of epilepsy. Charlsie Questriyanka O Yadav   ECHOCARDIOGRAM COMPLETE  Result Date: 06/18/2022    ECHOCARDIOGRAM REPORT   Patient Name:   Fernande BoydenWILLIAM R [name] Date of Exam: 06/18/2022 Medical Rec #:  [id number]         Height:       69.0 in Accession #:    [id number]        Weight:        196.6 lb Date of Birth:  03-31-1981         BSA:          2.051 m Patient Age:    41 years          BP:           106/76 mmHg Patient Gender: M                 HR:           98 bpm. [facility] Procedure: 2D Echo, Cardiac Doppler and Color Doppler Indications:    Syncope  History:        Patient has no prior history of Echocardiogram examinations.                 Signs/Symptoms:Syncope. Etoh abuse.  Sonographer:    Mikki Harbororothy Buchanan Referring Phys: [phone] Chasin Findling IMPRESSIONS  1. Left ventricular ejection fraction, by estimation, is 55 to 60%. The left ventricle has normal function. The left ventricle has no regional wall motion abnormalities. Left ventricular diastolic parameters were normal.  2. Right ventricular systolic function is normal. The right ventricular size is mildly enlarged. Tricuspid regurgitation signal is inadequate for assessing PA pressure.  3. The mitral valve is grossly normal. Trivial mitral valve regurgitation. No evidence of mitral stenosis.  4. The aortic valve is tricuspid. Aortic valve regurgitation is not visualized. No aortic stenosis is present.  5. The inferior vena cava is normal in size with greater than 50% respiratory variability, suggesting right atrial pressure of 3 mmHg. Conclusion(s)/Recommendation(s): Normal biventricular function without evidence of hemodynamically significant valvular heart disease. FINDINGS  Left Ventricle: Left ventricular ejection fraction, by estimation, is 55 to 60%. The left ventricle has normal function. The left ventricle has no regional wall motion abnormalities. The left ventricular internal cavity size was normal in size. There is  no left ventricular hypertrophy. Left ventricular diastolic parameters were normal. Right Ventricle: The right ventricular size is mildly enlarged. No increase in right ventricular wall thickness. Right ventricular systolic function is normal. Tricuspid regurgitation signal is inadequate for assessing PA  pressure. Left Atrium: Left atrial size was normal in size. Right Atrium: Right atrial size was normal in size. Pericardium: There is no evidence of pericardial effusion. Mitral Valve: The mitral  valve is grossly normal. Trivial mitral valve regurgitation. No evidence of mitral valve stenosis. MV peak gradient, 1.0 mmHg. The mean mitral valve gradient is 0.0 mmHg. Tricuspid Valve: The tricuspid valve is grossly normal. Tricuspid valve regurgitation is trivial. No evidence of tricuspid stenosis. Aortic Valve: The aortic valve is tricuspid. Aortic valve regurgitation is not visualized. No aortic stenosis is present. Aortic valve mean gradient measures 2.0 mmHg. Aortic valve peak gradient measures 3.7 mmHg. Aortic valve area, by VTI measures 4.34 cm. Pulmonic Valve: The pulmonic valve was grossly normal. Pulmonic valve regurgitation is trivial. No evidence of pulmonic stenosis. Aorta: The aortic root is normal in size and structure. Venous: The inferior vena cava is normal in size with greater than 50% respiratory variability, suggesting right atrial pressure of 3 mmHg. IAS/Shunts: The atrial septum is grossly normal.  LEFT VENTRICLE PLAX 2D LVIDd:         4.90 cm     Diastology LVIDs:         3.10 cm     LV e' medial:    7.94 cm/s LV PW:         1.10 cm     LV E/e' medial:  5.1 LV IVS:        1.10 cm     LV e' lateral:   7.29 cm/s LVOT diam:     2.30 cm     LV E/e' lateral: 5.6 LV SV:         69 LV SV Index:   34 LVOT Area:     4.15 cm  LV Volumes (MOD) LV vol d, MOD A2C: 84.3 ml LV vol d, MOD A4C: 85.2 ml LV vol s, MOD A2C: 43.2 ml LV vol s, MOD A4C: 41.6 ml LV SV MOD A2C:     41.1 ml LV SV MOD A4C:     85.2 ml LV SV MOD BP:      44.2 ml RIGHT VENTRICLE RV Basal diam:  3.70 cm RV Mid diam:    3.80 cm RV S prime:     10.40 cm/s TAPSE (M-mode): 1.8 cm LEFT ATRIUM             Index        RIGHT ATRIUM           Index LA diam:        3.80 cm 1.85 cm/m   RA Area:     17.30 cm LA Vol (A2C):   46.6 ml 22.72 ml/m  RA  Volume:   47.60 ml  23.21 ml/m LA Vol (A4C):   41.5 ml 20.23 ml/m LA Biplane Vol: 46.6 ml 22.72 ml/m  AORTIC VALVE                    PULMONIC VALVE AV Area (Vmax):    4.03 cm     PV Vmax:       0.58 m/s AV Area (Vmean):   3.00 cm     PV Peak grad:  1.4 mmHg AV Area (VTI):     4.34 cm AV Vmax:           95.70 cm/s AV Vmean:          70.300 cm/s AV VTI:            0.160 m AV Peak Grad:      3.7 mmHg AV Mean Grad:      2.0 mmHg LVOT Vmax:  92.90 cm/s LVOT Vmean:        50.700 cm/s LVOT VTI:          0.167 m LVOT/AV VTI ratio: 1.04  AORTA Ao Root diam: 3.80 cm MITRAL VALVE MV Area (PHT): 4.80 cm    SHUNTS MV Area VTI:   5.02 cm    Systemic VTI:  0.17 m MV Peak grad:  1.0 mmHg    Systemic Diam: 2.30 cm MV Mean grad:  0.0 mmHg MV Vmax:       0.51 m/s MV Vmean:      33.3 cm/s MV Decel Time: 158 msec MV E velocity: 40.60 cm/s MV A velocity: 46.80 cm/s MV E/A ratio:  0.87 Lennie Odor MD Electronically signed by Lennie Odor MD Signature Date/Time: 06/18/2022/3:44:41 PM    Final    US Abdomen Limited RUQ (LIVER/GB)  Result Date: 06/18/2022 CLINICAL DATA:  Transaminitis.  History of hepatitis C. EXAM: ULTRASOUND ABDOMEN LIMITED RIGHT UPPER QUADRANT COMPARISON:  None Available. FINDINGS: Gallbladder: No gallstones or wall thickening visualized. No sonographic Murphy sign noted by sonographer. Common bile duct: Diameter: 6 mm Liver: Diffusely increased parenchymal echogenicity without a focal lesion identified. Portal vein is patent on color Doppler imaging with normal direction of blood flow towards the liver. Other: None. IMPRESSION: 1. Echogenic liver, nonspecific but can be seen with steatosis, chronic hepatitis, and infiltrative processes. 2. No gallstones or biliary dilatation. Electronically Signed   By: Sebastian Ache M.D.   On: 06/18/2022 11:55   CT Head Wo Contrast  Result Date: 06/17/2022 CLINICAL DATA:  Altered mental status following alcohol abuse and a syncopal episode or seizure prior to  admission. EXAM: CT HEAD WITHOUT CONTRAST TECHNIQUE: Contiguous axial images were obtained from the base of the skull through the vertex without intravenous contrast. RADIATION DOSE REDUCTION: This exam was performed according to the departmental dose-optimization program which includes automated exposure control, adjustment of the mA and/or kV according to patient size and/or use of iterative reconstruction technique. COMPARISON:  None Available. FINDINGS: Brain: Normal appearing cerebral hemispheres and posterior fossa structures. Normal size and position of the ventricles. No intracranial hemorrhage, mass lesion or CT evidence of acute infarction. Vascular: No hyperdense vessel or unexpected calcification. Skull: Normal. Negative for fracture or focal lesion. Sinuses/Orbits: Unremarkable. Other: None. IMPRESSION: Normal examination. Electronically Signed   By: Beckie Salts M.D.   On: 06/17/2022 17:50    Catarina Hartshorn, DO  [facility]  If 7PM-7AM, please contact night-coverage [email or web address] Password [email or web address] 06/20/2022, 1:51 PM   LOS: 2 days

## 2022-06-21 LAB — COMPREHENSIVE METABOLIC PANEL
ALT: 106 U/L — ABNORMAL HIGH (ref 0–44)
AST: 59 U/L — ABNORMAL HIGH (ref 15–41)
Albumin: 3.6 g/dL (ref 3.5–5.0)
Alkaline Phosphatase: 53 U/L (ref 38–126)
Anion gap: 8 (ref 5–15)
BUN: 11 mg/dL (ref 6–20)
CO2: 25 mmol/L (ref 22–32)
Calcium: 8.9 mg/dL (ref 8.9–10.3)
Chloride: 104 mmol/L (ref 98–111)
Creatinine, Ser: 0.66 mg/dL (ref 0.61–1.24)
GFR, Estimated: >60 mL/min (ref >60)
Glucose, Bld: 117 mg/dL — ABNORMAL HIGH (ref 70–99)
Potassium: 3.4 mmol/L — ABNORMAL LOW (ref 3.5–5.1)
Sodium: 137 mmol/L (ref 135–145)
Total Bilirubin: 0.6 mg/dL (ref 0.3–1.2)
Total Protein: 7.1 g/dL (ref 6.5–8.1)

## 2022-06-21 LAB — MAGNESIUM: Magnesium: 1.8 mg/dL (ref 1.7–2.4)

## 2022-06-21 MED ORDER — POTASSIUM CHLORIDE CRYS ER 20 MEQ PO TBCR
20.0000 meq | EXTENDED_RELEASE_TABLET | Freq: Once | ORAL | Status: AC
  Administered 2022-06-21: 20 meq via ORAL
  Filled 2022-06-21: qty 1

## 2022-06-21 MED ORDER — ORAL CARE MOUTH RINSE: 15.0000 mL | OROMUCOSAL | Status: DC | PRN

## 2022-06-21 MED ORDER — MELATONIN 3 MG PO TABS
6.0000 mg | ORAL_TABLET | Freq: Once | ORAL | Status: AC
  Administered 2022-06-21: 6 mg via ORAL
  Filled 2022-06-21: qty 2

## 2022-06-21 MED ORDER — MAGNESIUM SULFATE 2 GM/50ML IV SOLN
2.0000 g | Freq: Once | INTRAVENOUS | Status: AC
  Administered 2022-06-21: 2 g via INTRAVENOUS
  Filled 2022-06-21: qty 50

## 2022-06-21 NOTE — Progress Notes (Signed)
PROGRESS NOTE  Trevor [name] CNO:[id number] DOB: 1980-12-01 DOA: 06/17/2022 PCP: Patient, No Pcp Per  Brief History:  41 y.o. male with medical history significant of elevated liver enzymes, herpes simplex virus type II, hepatitis C, alcohol abuse, and more presents to ED with a chief complaint of alcohol withdrawal.  Patient reports he thinks he had a seizure.  He was walking to the kitchen when everything became foggy.  He woke up on the floor.  He had bit his tongue, and his left shoulder was sore.  He did not lose control of his bowel or bladder.  He reports that he was immediately oriented upon waking.  He thinks he was out for a few minutes.  This was unwitnessed.  Those few minutes may very well included a postictal state that he is unaware of.  Patient reports the last 3 months he has been drinking 3 bottles of wine per day.  His last drink was the morning of the 21st.  He has had associated nausea, vomiting, tremors, anxiety, and palpitations.  He denies hallucinations.  Patient reports he is never had a seizure before.  Per his chart he has had alcohol withdrawal delirium in the past.  He reports he is no longer on Wellbutrin.  Patient is ready to quit drinking.   Patient does not smoke, drinks alcohol as above, does not use illicit drugs, and is not vaccinated for COVID.  Patient is full code.   In the early am 06/19/22, patient developing worsening agitation, pulling out IVs with CIWA score in 20s.  He was transferred to SDU and started on precedex     Assessment and Plan: * Alcohol withdrawal (HCC) -DELIRIUM TREMENS -Last drink 36 hours PTA -Had been drinking 3 bottles of wine per day -3 doses of phenobarbital in the ED -06/19/22--received ativan 15 mg in last 8 hours with continued agitation -06/19/22--transfer to SDU and start precedex>>stabilized Plan to wean precedex in next 24 hours  Hyponatremia -due to volume depletion and poor solute intake -had 3-4 episodes  emesis prior to admission -started on IV NS>>improving    QT prolongation - Secondary to withdrawals and hypokalemia -Monitor on telemetry -Avoid QT prolonging agents when possible  Loss of consciousness (HCC) Pt had alcohol withdrawal seizure -He did bite his tongue -No longer taking Wellbutrin although it is on his med list -EEG--no seizure -no seizure since admission  Hypokalemia - replete -mag 2.0  GERD (gastroesophageal reflux disease) - Continue Protonix  Transaminitis - Likely alcoholic hepatitis and ASH - Right upper quadrant ultrasound--echogenic liver - History of hepatitis C finished Epclusa - Continue to monitor>>trending down    Family Communication:  Father updated 06/19/22   Consultants:  none   Code Status:  FULL    DVT Prophylaxis:  Helen Heparin      Procedures: As Listed in Progress Note Above   Antibiotics: None            Subjective: Patient denies fevers, chills, headache, chest pain, dyspnea, nausea, vomiting, diarrhea, abdominal pain, dysuria, hematuria, hematochezia, and melena.   Objective: Vitals:   06/21/22 0730 06/21/22 1103 06/21/22 1200 06/21/22 1300  BP:   (!) 134/90 (!) 130/90  Pulse:      Resp:  20 17 (!) 21  Temp: 97.6 F (36.4 C) 98.5 F (36.9 C)    TempSrc: Oral Oral    SpO2:      Weight:  Height:        Intake/Output Summary (Last 24 hours) at 06/21/2022 1340 Last data filed at 06/21/2022 [id number] Gross per 24 hour  Intake 833.86 ml  Output 700 ml  Net 133.86 ml   Weight change: 0.3 kg Exam:  General:  Pt is alert, follows commands appropriately, not in acute distress HEENT: No icterus, No thrush, No neck mass, [place]/AT Cardiovascular: RRR, S1/S2, no rubs, no gallops Respiratory: CTA bilaterally, no wheezing, no crackles, no rhonchi Abdomen: Soft/+BS, non tender, non distended, no guarding Extremities: No edema, No lymphangitis, No petechiae, No rashes, no synovitis   Data Reviewed: I have  personally reviewed following labs and imaging studies Basic Metabolic Panel: Recent Labs  Lab 06/17/22 1536 06/17/22 1636 06/18/22 0404 06/19/22 0344 06/20/22 0556 06/21/22 0352  NA 131*  --  131* 134* 138 137  K 3.2*  --  3.1* 3.1* 3.4* 3.4*  CL 94*  --  95* 103 105 104  CO2 24  --  28 24 24 25   GLUCOSE 175*  --  93 105* 100* 117*  BUN 10  --  9 8 9 11   CREATININE 0.77  --  0.90 0.77 0.63 0.66  CALCIUM 10.0  --  8.6* 8.4* 9.1 8.9  MG  --  2.3 2.3 2.0 1.9 1.8   Liver Function Tests: Recent Labs  Lab 06/17/22 1536 06/18/22 0404 06/19/22 0344 06/20/22 0556 06/21/22 0352  AST 206* 134* 72* 70* 59*  ALT 270* 184* 123* 120* 106*  ALKPHOS 69 54 48 56 53  BILITOT 1.6* 1.5* 0.9 0.7 0.6  PROT 8.6* 6.9 6.3* 7.2 7.1  ALBUMIN 5.0 3.9 3.5 3.8 3.6   No results for input(s): "LIPASE", "AMYLASE" in the last 168 hours. No results for input(s): "AMMONIA" in the last 168 hours. Coagulation Profile: No results for input(s): "INR", "PROTIME" in the last 168 hours. CBC: Recent Labs  Lab 06/17/22 1536 06/18/22 0404  WBC 6.1 6.3  NEUTROABS  --  4.0  HGB 17.5* 14.8  HCT 48.3 43.0  MCV 90.4 93.9  PLT 193 149*   Cardiac Enzymes: No results for input(s): "CKTOTAL", "CKMB", "CKMBINDEX", "TROPONINI" in the last 168 hours. BNP: Invalid input(s): "POCBNP" CBG: Recent Labs  Lab 06/18/22 2049 06/19/22 0804 06/20/22 0805  GLUCAP 127* 146* 96   HbA1C: No results for input(s): "HGBA1C" in the last 72 hours. Urine analysis:    Component Value Date/Time   COLORURINE AMBER (A) 06/18/2022 1606   APPEARANCEUR HAZY (A) 06/18/2022 1606   LABSPEC 1.009 06/18/2022 1606   PHURINE 6.0 06/18/2022 1606   GLUCOSEU NEGATIVE 06/18/2022 1606   HGBUR NEGATIVE 06/18/2022 1606   BILIRUBINUR NEGATIVE 06/18/2022 1606   BILIRUBINUR negative 08/25/2015 1008   KETONESUR 5 (A) 06/18/2022 1606   PROTEINUR NEGATIVE 06/18/2022 1606   UROBILINOGEN 0.2 08/25/2015 1008   NITRITE NEGATIVE 06/18/2022 1606    LEUKOCYTESUR NEGATIVE 06/18/2022 1606   Sepsis Labs: @LABRCNTIP (procalcitonin:4,lacticidven:4) )No results found for this or any previous visit (from the past 240 hour(s)).   Scheduled Meds:  Chlorhexidine Gluconate Cloth  6 each Topical Daily   enoxaparin (LOVENOX) injection  40 mg Subcutaneous QHS   pantoprazole  40 mg Oral Daily   Continuous Infusions:  dexmedetomidine 0.1 mcg/kg/hr (06/21/22 [id number])    Procedures/Studies: EEG adult  Result Date: 06/19/2022 Charlsie QuestYadav, Priyanka O, MD     06/19/2022  8:25 AM Patient Name: Trevor [name] MRN: [id number] Epilepsy Attending: Charlsie QuestPriyanka O Yadav Referring Physician/Provider: Lilyan GilfordZierle-Ghosh, Asia B, DO Date: 06/18/2022 Duration:  22.59 mins Patient history: 41 year old male with seizure in the setting of alcohol withdrawal.  EEG to evaluate for seizure. Level of alertness: Awake AEDs during EEG study: Ativan Technical aspects: This EEG study was done with scalp electrodes positioned according to the 10-20 International system of electrode placement. Electrical activity was reviewed with band pass filter of 1-70Hz , sensitivity of 7 uV/mm, display speed of 85mm/sec with a 60Hz  notched filter applied as appropriate. EEG data were recorded continuously and digitally stored.  Video monitoring was available and reviewed as appropriate. Description: The posterior dominant rhythm consists of 8 Hz activity of moderate voltage (25-35 uV) seen predominantly in posterior head regions, symmetric and reactive to eye opening and eye closing. Hyperventilation and photic stimulation were not performed.   IMPRESSION: This study is within normal limits. No seizures or epileptiform discharges were seen throughout the recording. A normal interictal EEG does not exclude nor support the diagnosis of epilepsy.   ECHOCARDIOGRAM COMPLETE  Result Date: 06/18/2022    ECHOCARDIOGRAM REPORT   Patient Name:   Trevor [name] Date of Exam: 06/18/2022 Medical Rec #:   06/20/2022         Height:       69.0 in Accession #:    [id number]        Weight:       196.6 lb Date of Birth:  04/12/1981         BSA:          2.051 m Patient Age:    41 years          BP:           106/76 mmHg Patient Gender: M                 HR:           98 bpm. Exam Location:  04/16/1981 Procedure: 2D Echo, Cardiac Doppler and Color Doppler Indications:    Syncope  History:        Patient has no prior history of Echocardiogram examinations.                 Signs/Symptoms:Syncope. Etoh abuse.  Sonographer:    Jeani Hawking Referring Phys: [phone] Telesia Ates IMPRESSIONS  1. Left ventricular ejection fraction, by estimation, is 55 to 60%. The left ventricle has normal function. The left ventricle has no regional wall motion abnormalities. Left ventricular diastolic parameters were normal.  2. Right ventricular systolic function is normal. The right ventricular size is mildly enlarged. Tricuspid regurgitation signal is inadequate for assessing PA pressure.  3. The mitral valve is grossly normal. Trivial mitral valve regurgitation. No evidence of mitral stenosis.  4. The aortic valve is tricuspid. Aortic valve regurgitation is not visualized. No aortic stenosis is present.  5. The inferior vena cava is normal in size with greater than 50% respiratory variability, suggesting right atrial pressure of 3 mmHg. Conclusion(s)/Recommendation(s): Normal biventricular function without evidence of hemodynamically significant valvular heart disease. FINDINGS  Left Ventricle: Left ventricular ejection fraction, by estimation, is 55 to 60%. The left ventricle has normal function. The left ventricle has no regional wall motion abnormalities. The left ventricular internal cavity size was normal in size. There is  no left ventricular hypertrophy. Left ventricular diastolic parameters were normal. Right Ventricle: The right ventricular size is mildly enlarged. No increase in right ventricular wall thickness. Right ventricular  systolic function is normal. Tricuspid regurgitation signal is inadequate for assessing PA pressure. Left  Atrium: Left atrial size was normal in size. Right Atrium: Right atrial size was normal in size. Pericardium: There is no evidence of pericardial effusion. Mitral Valve: The mitral valve is grossly normal. Trivial mitral valve regurgitation. No evidence of mitral valve stenosis. MV peak gradient, 1.0 mmHg. The mean mitral valve gradient is 0.0 mmHg. Tricuspid Valve: The tricuspid valve is grossly normal. Tricuspid valve regurgitation is trivial. No evidence of tricuspid stenosis. Aortic Valve: The aortic valve is tricuspid. Aortic valve regurgitation is not visualized. No aortic stenosis is present. Aortic valve mean gradient measures 2.0 mmHg. Aortic valve peak gradient measures 3.7 mmHg. Aortic valve area, by VTI measures 4.34 cm. Pulmonic Valve: The pulmonic valve was grossly normal. Pulmonic valve regurgitation is trivial. No evidence of pulmonic stenosis. Aorta: The aortic root is normal in size and structure. Venous: The inferior vena cava is normal in size with greater than 50% respiratory variability, suggesting right atrial pressure of 3 mmHg. IAS/Shunts: The atrial septum is grossly normal.  LEFT VENTRICLE PLAX 2D LVIDd:         4.90 cm     Diastology LVIDs:         3.10 cm     LV e' medial:    7.94 cm/s LV PW:         1.10 cm     LV E/e' medial:  5.1 LV IVS:        1.10 cm     LV e' lateral:   7.29 cm/s LVOT diam:     2.30 cm     LV E/e' lateral: 5.6 LV SV:         69 LV SV Index:   34 LVOT Area:     4.15 cm  LV Volumes (MOD) LV vol d, MOD A2C: 84.3 ml LV vol d, MOD A4C: 85.2 ml LV vol s, MOD A2C: 43.2 ml LV vol s, MOD A4C: 41.6 ml LV SV MOD A2C:     41.1 ml LV SV MOD A4C:     85.2 ml LV SV MOD BP:      44.2 ml RIGHT VENTRICLE RV Basal diam:  3.70 cm RV Mid diam:    3.80 cm RV S prime:     10.40 cm/s TAPSE (M-mode): 1.8 cm LEFT ATRIUM             Index        RIGHT ATRIUM           Index LA diam:         3.80 cm 1.85 cm/m   RA Area:     17.30 cm LA Vol (A2C):   46.6 ml 22.72 ml/m  RA Volume:   47.60 ml  23.21 ml/m LA Vol (A4C):   41.5 ml 20.23 ml/m LA Biplane Vol: 46.6 ml 22.72 ml/m  AORTIC VALVE                    PULMONIC VALVE AV Area (Vmax):    4.03 cm     PV Vmax:       0.58 m/s AV Area (Vmean):   3.00 cm     PV Peak grad:  1.4 mmHg AV Area (VTI):     4.34 cm AV Vmax:           95.70 cm/s AV Vmean:          70.300 cm/s AV VTI:            0.160 m AV  Peak Grad:      3.7 mmHg AV Mean Grad:      2.0 mmHg LVOT Vmax:         92.90 cm/s LVOT Vmean:        50.700 cm/s LVOT VTI:          0.167 m LVOT/AV VTI ratio: 1.04  AORTA Ao Root diam: 3.80 cm MITRAL VALVE MV Area (PHT): 4.80 cm    SHUNTS MV Area VTI:   5.02 cm    Systemic VTI:  0.17 m MV Peak grad:  1.0 mmHg    Systemic Diam: 2.30 cm MV Mean grad:  0.0 mmHg MV Vmax:       0.51 m/s MV Vmean:      33.3 cm/s MV Decel Time: 158 msec MV E velocity: 40.60 cm/s MV A velocity: 46.80 cm/s MV E/A ratio:  0.87 Lennie Odor MD Electronically signed by Lennie Odor MD Signature Date/Time: 06/18/2022/3:44:41 PM    Final    US Abdomen Limited RUQ (LIVER/GB)  Result Date: 06/18/2022 CLINICAL DATA:  Transaminitis.  History of hepatitis C. EXAM: ULTRASOUND ABDOMEN LIMITED RIGHT UPPER QUADRANT COMPARISON:  None Available. FINDINGS: Gallbladder: No gallstones or wall thickening visualized. No sonographic Murphy sign noted by sonographer. Common bile duct: Diameter: 6 mm Liver: Diffusely increased parenchymal echogenicity without a focal lesion identified. Portal vein is patent on color Doppler imaging with normal direction of blood flow towards the liver. Other: None. IMPRESSION: 1. Echogenic liver, nonspecific but can be seen with steatosis, chronic hepatitis, and infiltrative processes. 2. No gallstones or biliary dilatation. Electronically Signed   By: Sebastian Ache M.D.   On: 06/18/2022 11:55   CT Head Wo Contrast  Result Date: 06/17/2022 CLINICAL DATA:   Altered mental status following alcohol abuse and a syncopal episode or seizure prior to admission. EXAM: CT HEAD WITHOUT CONTRAST TECHNIQUE: Contiguous axial images were obtained from the base of the skull through the vertex without intravenous contrast. RADIATION DOSE REDUCTION: This exam was performed according to the departmental dose-optimization program which includes automated exposure control, adjustment of the mA and/or kV according to patient size and/or use of iterative reconstruction technique. COMPARISON:  None Available. FINDINGS: Brain: Normal appearing cerebral hemispheres and posterior fossa structures. Normal size and position of the ventricles. No intracranial hemorrhage, mass lesion or CT evidence of acute infarction. Vascular: No hyperdense vessel or unexpected calcification. Skull: Normal. Negative for fracture or focal lesion. Sinuses/Orbits: Unremarkable. Other: None. IMPRESSION: Normal examination. Electronically Signed   By: Beckie Salts M.D.   On: 06/17/2022 17:50    Catarina Hartshorn, DO  [facility]  If 7PM-7AM, please contact night-coverage [email or web address] Password [email or web address] 06/21/2022, 1:40 PM   LOS: 3 days

## 2022-06-21 NOTE — Progress Notes (Signed)
Pt HR brady this am, noted to be in upper 40s at times while sleeping.  Pt in NAD.  Precedex titrated as RASS currently at 0,currently at 0.1 mcg.   HR running mid 50s.  Reviewed with Dr. Thomes Dinning, keep current rate at this time.  Will continue to monitor patient.  Pt has been calm/cooperative this shift.  Initially had tearfulness and anxiety at start of shift while talking about quitting drinking and coping mechanisms, given resources for community and emotional support.

## 2022-06-22 DIAGNOSIS — F10931 Alcohol use, unspecified with withdrawal delirium: Secondary | ICD-10-CM

## 2022-06-22 LAB — BASIC METABOLIC PANEL
Anion gap: 9 (ref 5–15)
BUN: 11 mg/dL (ref 6–20)
CO2: 23 mmol/L (ref 22–32)
Calcium: 8.9 mg/dL (ref 8.9–10.3)
Chloride: 106 mmol/L (ref 98–111)
Creatinine, Ser: 0.67 mg/dL (ref 0.61–1.24)
GFR, Estimated: >60 mL/min (ref >60)
Glucose, Bld: 104 mg/dL — ABNORMAL HIGH (ref 70–99)
Potassium: 3.4 mmol/L — ABNORMAL LOW (ref 3.5–5.1)
Sodium: 138 mmol/L (ref 135–145)

## 2022-06-22 LAB — MAGNESIUM: Magnesium: 2.2 mg/dL (ref 1.7–2.4)

## 2022-06-22 MED ORDER — LORAZEPAM 1 MG PO TABS
1.0000 mg | ORAL_TABLET | ORAL | Status: DC | PRN
  Administered 2022-06-22: 1 mg via ORAL
  Filled 2022-06-22: qty 1

## 2022-06-22 MED ORDER — THIAMINE HCL 100 MG/ML IJ SOLN: 100.0000 mg | Freq: Every day | INTRAMUSCULAR | Status: DC

## 2022-06-22 MED ORDER — ADULT MULTIVITAMIN W/MINERALS CH
1.0000 | ORAL_TABLET | Freq: Every day | ORAL | Status: DC
  Administered 2022-06-22 – 2022-06-23 (×2): 1 via ORAL
  Filled 2022-06-22 (×2): qty 1

## 2022-06-22 MED ORDER — THIAMINE HCL 100 MG PO TABS
100.0000 mg | ORAL_TABLET | Freq: Every day | ORAL | Status: DC
  Administered 2022-06-22 – 2022-06-23 (×2): 100 mg via ORAL
  Filled 2022-06-22 (×2): qty 1

## 2022-06-22 MED ORDER — IBUPROFEN 400 MG PO TABS
400.0000 mg | ORAL_TABLET | Freq: Once | ORAL | Status: AC
  Administered 2022-06-22: 400 mg via ORAL
  Filled 2022-06-22: qty 1

## 2022-06-22 MED ORDER — LORAZEPAM 2 MG/ML IJ SOLN: 1.0000 mg | INTRAMUSCULAR | Status: DC | PRN

## 2022-06-22 MED ORDER — POTASSIUM CHLORIDE CRYS ER 20 MEQ PO TBCR
40.0000 meq | EXTENDED_RELEASE_TABLET | Freq: Once | ORAL | Status: AC
Start: 2022-06-22 — End: 2022-06-22
  Administered 2022-06-22: 40 meq via ORAL
  Filled 2022-06-22: qty 2

## 2022-06-22 MED ORDER — LORAZEPAM 2 MG/ML IJ SOLN
2.0000 mg | Freq: Once | INTRAMUSCULAR | Status: AC
Start: 2022-06-22 — End: 2022-06-22
  Administered 2022-06-22: 2 mg via INTRAVENOUS
  Filled 2022-06-22: qty 1

## 2022-06-22 MED ORDER — LORAZEPAM 2 MG/ML IJ SOLN
4.0000 mg | Freq: Once | INTRAMUSCULAR | Status: DC
Start: 2022-06-22 — End: 2022-06-22

## 2022-06-22 MED ORDER — CHLORDIAZEPOXIDE HCL 25 MG PO CAPS
25.0000 mg | ORAL_CAPSULE | Freq: Three times a day (TID) | ORAL | Status: DC
  Administered 2022-06-22 (×2): 25 mg via ORAL
  Filled 2022-06-22 (×2): qty 1

## 2022-06-22 MED ORDER — FOLIC ACID 1 MG PO TABS
1.0000 mg | ORAL_TABLET | Freq: Every day | ORAL | Status: DC
  Administered 2022-06-22 – 2022-06-23 (×2): 1 mg via ORAL
  Filled 2022-06-22 (×2): qty 1

## 2022-06-22 NOTE — Progress Notes (Signed)
PROGRESS NOTE  Trevor [name] [id number] DOB: June 27, 1981 DOA: 06/17/2022 PCP: Patient, No Pcp Per  Brief History:  41 y.o. male with medical history significant of elevated liver enzymes, herpes simplex virus type II, hepatitis C, alcohol abuse, and more presents to ED with a chief complaint of alcohol withdrawal.  Patient reports he thinks he had a seizure.  He was walking to the kitchen when everything became foggy.  He woke up on the floor.  He had bit his tongue, and his left shoulder was sore.  He did not lose control of his bowel or bladder.  He reports that he was immediately oriented upon waking.  He thinks he was out for a few minutes.  This was unwitnessed.  Those few minutes may very well included a postictal state that he is unaware of.  Patient reports the last 3 months he has been drinking 3 bottles of wine per day.  His last drink was the morning of the 21st.  He has had associated nausea, vomiting, tremors, anxiety, and palpitations.  He denies hallucinations.  Patient reports he is never had a seizure before.  Per his chart he has had alcohol withdrawal delirium in the past.  He reports he is no longer on Wellbutrin.  Patient is ready to quit drinking.   Patient does not smoke, drinks alcohol as above, does not use illicit drugs, and is not vaccinated for COVID.  Patient is full code.   In the early am 06/19/22, patient developing worsening agitation, pulling out IVs with CIWA score in 20s.  He was transferred to SDU and started on precedex   Weaned off precedex on 8/27.  Started back on intermittent dosing ativan due to continued withdrawal symptoms  Assessment and Plan: * Alcohol withdrawal ([place]) -DELIRIUM TREMENS -Last drink 36 hours PTA -Had been drinking 3 bottles of wine per day -3 doses of phenobarbital in the ED -06/19/22--received ativan 15 mg in last 8 hours with continued agitation -06/19/22--transfer to SDU and start  precedex>>stabilized -06/21/22>>>wean precedex off  Hyponatremia -due to volume depletion and poor solute intake -had 3-4 episodes emesis prior to admission -started on IV NS>>improving    QT prolongation - Secondary to withdrawals and hypokalemia -Monitor on telemetry -Avoid QT prolonging agents when possible  Loss of consciousness (HCC) Pt had alcohol withdrawal seizure -He did bite his tongue -No longer taking Wellbutrin although it is on his med list -EEG--no seizure -no seizure since admission  Hypokalemia - replete -mag 2.0  GERD (gastroesophageal reflux disease) - Continue Protonix  Transaminitis - Likely alcoholic hepatitis and ASH - Right upper quadrant ultrasound--echogenic liver - History of hepatitis C finished Epclusa - Continue to monitor>>trending down   Family Communication:  Father updated 06/19/22   Consultants:  none   Code Status:  FULL    DVT Prophylaxis:  [place] Heparin      Procedures: As Listed in Progress Note Above   Antibiotics: None             Subjective: Patient denies fevers, chills, headache, chest pain, dyspnea, nausea, vomiting, diarrhea, abdominal pain, dysuria, hematuria, hematochezia, and melena.   Objective: Vitals:   06/22/22 1200 06/22/22 1300 06/22/22 1400 06/22/22 1500  BP: (!) 126/59 116/70  138/83  Pulse: 66 76  (!) 110  Resp: 16 14  20   Temp:      TempSrc:      SpO2: 98% 97% 94% 93%  Weight:  Height:        Intake/Output Summary (Last 24 hours) at 06/22/2022 1647 Last data filed at 06/22/2022 1600 Gross per 24 hour  Intake 828.14 ml  Output 500 ml  Net 328.14 ml   Weight change: -0.9 kg Exam:  General:  Pt is alert, follows commands appropriately, not in acute distress HEENT: No icterus, No thrush, No neck mass, [place]/AT Cardiovascular: RRR, S1/S2, no rubs, no gallops Respiratory: CTA bilaterally, no wheezing, no crackles, no rhonchi Abdomen: Soft/+BS, non tender, non distended, no  guarding Extremities: No edema, No lymphangitis, No petechiae, No rashes, no synovitis   Data Reviewed: I have personally reviewed following labs and imaging studies Basic Metabolic Panel: Recent Labs  Lab 06/18/22 0404 29-Jun-2022 0344 06/20/22 0556 06/21/22 0352 06/22/22 0406  NA 131* 134* 138 137 138  K 3.1* 3.1* 3.4* 3.4* 3.4*  CL 95* 103 105 104 106  CO2 28 24 24 25 23   GLUCOSE 93 105* 100* 117* 104*  BUN 9 8 9 11 11   CREATININE 0.90 0.77 0.63 0.66 0.67  CALCIUM 8.6* 8.4* 9.1 8.9 8.9  MG 2.3 2.0 1.9 1.8 2.2   Liver Function Tests: Recent Labs  Lab 06/17/22 1536 06/18/22 0404 06/29/2022 0344 06/20/22 0556 06/21/22 0352  AST 206* 134* 72* 70* 59*  ALT 270* 184* 123* 120* 106*  ALKPHOS 69 54 48 56 53  BILITOT 1.6* 1.5* 0.9 0.7 0.6  PROT 8.6* 6.9 6.3* 7.2 7.1  ALBUMIN 5.0 3.9 3.5 3.8 3.6   No results for input(s): "LIPASE", "AMYLASE" in the last 168 hours. No results for input(s): "AMMONIA" in the last 168 hours. Coagulation Profile: No results for input(s): "INR", "PROTIME" in the last 168 hours. CBC: Recent Labs  Lab 06/17/22 1536 06/18/22 0404  WBC 6.1 6.3  NEUTROABS  --  4.0  HGB 17.5* 14.8  HCT 48.3 43.0  MCV 90.4 93.9  PLT 193 149*   Cardiac Enzymes: No results for input(s): "CKTOTAL", "CKMB", "CKMBINDEX", "TROPONINI" in the last 168 hours. BNP: Invalid input(s): "POCBNP" CBG: Recent Labs  Lab 06/18/22 2049 June 29, 2022 0804 06/20/22 0805  GLUCAP 127* 146* 96   HbA1C: No results for input(s): "HGBA1C" in the last 72 hours. Urine analysis:    Component Value Date/Time   COLORURINE AMBER (A) 06/18/2022 1606   APPEARANCEUR HAZY (A) 06/18/2022 1606   LABSPEC 1.009 06/18/2022 1606   PHURINE 6.0 06/18/2022 1606   GLUCOSEU NEGATIVE 06/18/2022 1606   HGBUR NEGATIVE 06/18/2022 1606   BILIRUBINUR NEGATIVE 06/18/2022 1606   BILIRUBINUR negative 08/25/2015 1008   KETONESUR 5 (A) 06/18/2022 1606   PROTEINUR NEGATIVE 06/18/2022 1606   UROBILINOGEN 0.2  08/25/2015 1008   NITRITE NEGATIVE 06/18/2022 1606   LEUKOCYTESUR NEGATIVE 06/18/2022 1606   Sepsis Labs: @LABRCNTIP (procalcitonin:4,lacticidven:4) )No results found for this or any previous visit (from the past 240 hour(s)).   Scheduled Meds:  Chlorhexidine Gluconate Cloth  6 each Topical Daily   enoxaparin (LOVENOX) injection  40 mg Subcutaneous QHS   folic acid  1 mg Oral Daily   multivitamin with minerals  1 tablet Oral Daily   pantoprazole  40 mg Oral Daily   thiamine  100 mg Oral Daily   Or   thiamine  100 mg Intravenous Daily   Continuous Infusions:  dexmedetomidine Stopped (06/22/22 0615)    Procedures/Studies: EEG adult  Result Date: 06/29/22 , MD     06/29/22  8:25 AM Patient Name: Trevor [name] MRN: [facility]ls[facility] Epilepsy Attending: 06/21/2022 Referring  Physician/Provider: Lilyan Gilford, DO Date: 06/18/2022 Duration: 22.59 mins Patient history: 41 year old male with seizure in the setting of alcohol withdrawal.  EEG to evaluate for seizure. Level of alertness: Awake AEDs during EEG study: Ativan Technical aspects: This EEG study was done with scalp electrodes positioned according to the 10-20 International system of electrode placement. Electrical activity was reviewed with band pass filter of 1-70Hz , sensitivity of 7 uV/mm, display speed of 8mm/sec with a 60Hz  notched filter applied as appropriate. EEG data were recorded continuously and digitally stored.  Video monitoring was available and reviewed as appropriate. Description: The posterior dominant rhythm consists of 8 Hz activity of moderate voltage (25-35 uV) seen predominantly in posterior head regions, symmetric and reactive to eye opening and eye closing. Hyperventilation and photic stimulation were not performed.   IMPRESSION: This study is within normal limits. No seizures or epileptiform discharges were seen throughout the recording. A normal interictal EEG does not exclude nor support  the diagnosis of epilepsy.   ECHOCARDIOGRAM COMPLETE  Result Date: 06/18/2022    ECHOCARDIOGRAM REPORT   Patient Name:   CACHE BILLS Date of Exam: 06/18/2022 Medical Rec #:  06/20/2022         Height:       69.0 in Accession #:    [id number]        Weight:       196.6 lb Date of Birth:  [birth year]         BSA:          2.051 m Patient Age:    41 years          BP:           106/76 mmHg Patient Gender: M                 HR:           98 bpm. Exam Location:  04/16/1981 Procedure: 2D Echo, Cardiac Doppler and Color Doppler Indications:    Syncope  History:        Patient has no prior history of Echocardiogram examinations.                 Signs/Symptoms:Syncope. Etoh abuse.  Sonographer:    Jeani Hawking Referring Phys: [phone] Alonah Lineback IMPRESSIONS  1. Left ventricular ejection fraction, by estimation, is 55 to 60%. The left ventricle has normal function. The left ventricle has no regional wall motion abnormalities. Left ventricular diastolic parameters were normal.  2. Right ventricular systolic function is normal. The right ventricular size is mildly enlarged. Tricuspid regurgitation signal is inadequate for assessing PA pressure.  3. The mitral valve is grossly normal. Trivial mitral valve regurgitation. No evidence of mitral stenosis.  4. The aortic valve is tricuspid. Aortic valve regurgitation is not visualized. No aortic stenosis is present.  5. The inferior vena cava is normal in size with greater than 50% respiratory variability, suggesting right atrial pressure of 3 mmHg. Conclusion(s)/Recommendation(s): Normal biventricular function without evidence of hemodynamically significant valvular heart disease. FINDINGS  Left Ventricle: Left ventricular ejection fraction, by estimation, is 55 to 60%. The left ventricle has normal function. The left ventricle has no regional wall motion abnormalities. The left ventricular internal cavity size was normal in size. There is  no left ventricular  hypertrophy. Left ventricular diastolic parameters were normal. Right Ventricle: The right ventricular size is mildly enlarged. No increase in right ventricular wall thickness. Right ventricular systolic function is normal. Tricuspid regurgitation  signal is inadequate for assessing PA pressure. Left Atrium: Left atrial size was normal in size. Right Atrium: Right atrial size was normal in size. Pericardium: There is no evidence of pericardial effusion. Mitral Valve: The mitral valve is grossly normal. Trivial mitral valve regurgitation. No evidence of mitral valve stenosis. MV peak gradient, 1.0 mmHg. The mean mitral valve gradient is 0.0 mmHg. Tricuspid Valve: The tricuspid valve is grossly normal. Tricuspid valve regurgitation is trivial. No evidence of tricuspid stenosis. Aortic Valve: The aortic valve is tricuspid. Aortic valve regurgitation is not visualized. No aortic stenosis is present. Aortic valve mean gradient measures 2.0 mmHg. Aortic valve peak gradient measures 3.7 mmHg. Aortic valve area, by VTI measures 4.34 cm. Pulmonic Valve: The pulmonic valve was grossly normal. Pulmonic valve regurgitation is trivial. No evidence of pulmonic stenosis. Aorta: The aortic root is normal in size and structure. Venous: The inferior vena cava is normal in size with greater than 50% respiratory variability, suggesting right atrial pressure of 3 mmHg. IAS/Shunts: The atrial septum is grossly normal.  LEFT VENTRICLE PLAX 2D LVIDd:         4.90 cm     Diastology LVIDs:         3.10 cm     LV e' medial:    7.94 cm/s LV PW:         1.10 cm     LV E/e' medial:  5.1 LV IVS:        1.10 cm     LV e' lateral:   7.29 cm/s LVOT diam:     2.30 cm     LV E/e' lateral: 5.6 LV SV:         69 LV SV Index:   34 LVOT Area:     4.15 cm  LV Volumes (MOD) LV vol d, MOD A2C: 84.3 ml LV vol d, MOD A4C: 85.2 ml LV vol s, MOD A2C: 43.2 ml LV vol s, MOD A4C: 41.6 ml LV SV MOD A2C:     41.1 ml LV SV MOD A4C:     85.2 ml LV SV MOD BP:      44.2  ml RIGHT VENTRICLE RV Basal diam:  3.70 cm RV Mid diam:    3.80 cm RV S prime:     10.40 cm/s TAPSE (M-mode): 1.8 cm LEFT ATRIUM             Index        RIGHT ATRIUM           Index LA diam:        3.80 cm 1.85 cm/m   RA Area:     17.30 cm LA Vol (A2C):   46.6 ml 22.72 ml/m  RA Volume:   47.60 ml  23.21 ml/m LA Vol (A4C):   41.5 ml 20.23 ml/m LA Biplane Vol: 46.6 ml 22.72 ml/m  AORTIC VALVE                    PULMONIC VALVE AV Area (Vmax):    4.03 cm     PV Vmax:       0.58 m/s AV Area (Vmean):   3.00 cm     PV Peak grad:  1.4 mmHg AV Area (VTI):     4.34 cm AV Vmax:           95.70 cm/s AV Vmean:          70.300 cm/s AV VTI:  0.160 m AV Peak Grad:      3.7 mmHg AV Mean Grad:      2.0 mmHg LVOT Vmax:         92.90 cm/s LVOT Vmean:        50.700 cm/s LVOT VTI:          0.167 m LVOT/AV VTI ratio: 1.04  AORTA Ao Root diam: 3.80 cm MITRAL VALVE MV Area (PHT): 4.80 cm    SHUNTS MV Area VTI:   5.02 cm    Systemic VTI:  0.17 m MV Peak grad:  1.0 mmHg    Systemic Diam: 2.30 cm MV Mean grad:  0.0 mmHg MV Vmax:       0.51 m/s MV Vmean:      33.3 cm/s MV Decel Time: 158 msec MV E velocity: 40.60 cm/s MV A velocity: 46.80 cm/s MV E/A ratio:  0.87 Eleonore Chiquito MD Electronically signed by Eleonore Chiquito MD Signature Date/Time: 06/18/2022/3:44:41 PM    Final    US Abdomen Limited RUQ (LIVER/GB)  Result Date: 06/18/2022 CLINICAL DATA:  Transaminitis.  History of hepatitis C. EXAM: ULTRASOUND ABDOMEN LIMITED RIGHT UPPER QUADRANT COMPARISON:  None Available. FINDINGS: Gallbladder: No gallstones or wall thickening visualized. No sonographic Murphy sign noted by sonographer. Common bile duct: Diameter: 6 mm Liver: Diffusely increased parenchymal echogenicity without a focal lesion identified. Portal vein is patent on color Doppler imaging with normal direction of blood flow towards the liver. Other: None. IMPRESSION: 1. Echogenic liver, nonspecific but can be seen with steatosis, chronic hepatitis, and  infiltrative processes. 2. No gallstones or biliary dilatation. Electronically Signed   By: Logan Bores M.D.   On: 06/18/2022 11:55   CT Head Wo Contrast  Result Date: 06/17/2022 CLINICAL DATA:  Altered mental status following alcohol abuse and a syncopal episode or seizure prior to admission. EXAM: CT HEAD WITHOUT CONTRAST TECHNIQUE: Contiguous axial images were obtained from the base of the skull through the vertex without intravenous contrast. RADIATION DOSE REDUCTION: This exam was performed according to the departmental dose-optimization program which includes automated exposure control, adjustment of the mA and/or kV according to patient size and/or use of iterative reconstruction technique. COMPARISON:  None Available. FINDINGS: Brain: Normal appearing cerebral hemispheres and posterior fossa structures. Normal size and position of the ventricles. No intracranial hemorrhage, mass lesion or CT evidence of acute infarction. Vascular: No hyperdense vessel or unexpected calcification. Skull: Normal. Negative for fracture or focal lesion. Sinuses/Orbits: Unremarkable. Other: None. IMPRESSION: Normal examination. Electronically Signed   By: Claudie Revering M.D.   On: 06/17/2022 17:50    Orson Eva, DO  [facility]  If 7PM-7AM, please contact night-coverage [email or web address] Password [facility] 06/22/2022, 4:47 PM   LOS: 4 days

## 2022-06-23 LAB — COMPREHENSIVE METABOLIC PANEL
ALT: 97 U/L — ABNORMAL HIGH (ref 0–44)
AST: 54 U/L — ABNORMAL HIGH (ref 15–41)
Albumin: 3.8 g/dL (ref 3.5–5.0)
Alkaline Phosphatase: 49 U/L (ref 38–126)
Anion gap: 9 (ref 5–15)
BUN: 14 mg/dL (ref 6–20)
CO2: 24 mmol/L (ref 22–32)
Calcium: 8.8 mg/dL — ABNORMAL LOW (ref 8.9–10.3)
Chloride: 103 mmol/L (ref 98–111)
Creatinine, Ser: 0.83 mg/dL (ref 0.61–1.24)
GFR, Estimated: >60 mL/min (ref >60)
Glucose, Bld: 97 mg/dL (ref 70–99)
Potassium: 3.4 mmol/L — ABNORMAL LOW (ref 3.5–5.1)
Sodium: 136 mmol/L (ref 135–145)
Total Bilirubin: 0.6 mg/dL (ref 0.3–1.2)
Total Protein: 7 g/dL (ref 6.5–8.1)

## 2022-06-23 LAB — MAGNESIUM: Magnesium: 2 mg/dL (ref 1.7–2.4)

## 2022-06-23 MED ORDER — CHLORDIAZEPOXIDE HCL 25 MG PO CAPS
25.0000 mg | ORAL_CAPSULE | Freq: Two times a day (BID) | ORAL | Status: DC
  Administered 2022-06-23: 25 mg via ORAL
  Filled 2022-06-23: qty 1

## 2022-06-23 MED ORDER — CHLORDIAZEPOXIDE HCL 25 MG PO CAPS: 25.0000 mg | ORAL_CAPSULE | Freq: Two times a day (BID) | ORAL | 0 refills | Status: DC

## 2022-06-23 MED ORDER — POTASSIUM CHLORIDE CRYS ER 20 MEQ PO TBCR
40.0000 meq | EXTENDED_RELEASE_TABLET | Freq: Once | ORAL | Status: AC
  Administered 2022-06-23: 40 meq via ORAL
  Filled 2022-06-23: qty 2

## 2022-06-23 MED ORDER — MELATONIN 3 MG PO TABS
6.0000 mg | ORAL_TABLET | Freq: Once | ORAL | Status: AC
  Administered 2022-06-23: 6 mg via ORAL
  Filled 2022-06-23: qty 2

## 2022-06-23 NOTE — Discharge Summary (Signed)
Physician Discharge Summary   Patient: Trevor [name] MRN: [id number] DOB: 1980/11/16  Admit date:     06/17/2022  Discharge date: 06/23/22  Discharge Physician: Trevor [name]   PCP: Patient, No Pcp Per   Recommendations at discharge:   Please follow up with primary care provider within 1-2 weeks  Please repeat BMP and CBC in one week  Please follow up on/with      Hospital Course: 41 y.o. male with medical history significant of elevated liver enzymes, herpes simplex virus type II, hepatitis C, alcohol abuse, and more presents to ED with a chief complaint of alcohol withdrawal.  Patient reports he thinks he had a seizure.  He was walking to the kitchen when everything became foggy.  He woke up on the floor.  He had bit his tongue, and his left shoulder was sore.  He did not lose control of his bowel or bladder.  He reports that he was immediately oriented upon waking.  He thinks he was out for a few minutes.  This was unwitnessed.  Those few minutes may very well included a postictal state that he is unaware of.  Patient reports the last 3 months he has been drinking 3 bottles of wine per day.  His last drink was the morning of the 21st.  He has had associated nausea, vomiting, tremors, anxiety, and palpitations.  He denies hallucinations.  Patient reports he is never had a seizure before.  Per his chart he has had alcohol withdrawal delirium in the past.  He reports he is no longer on Wellbutrin.  Patient is ready to quit drinking.   Patient does not smoke, drinks alcohol as above, does not use illicit drugs, and is not vaccinated for COVID.  Patient is full code.   In the early am 06/19/22, patient developing worsening agitation, pulling out IVs with CIWA score in 20s.  He was transferred to SDU and started on precedex   Weaned off precedex on 8/27.  Started back on intermittent dosing ativan due to continued withdrawal symptoms.  Did well with librium bid.  No further withdraw s/s.   Ambulatory referral made to neurology.  Assessment and Plan: * Alcohol withdrawal (HCC) -DELIRIUM TREMENS -Last drink 36 hours PTA -Had been drinking 3 bottles of wine per day -3 doses of phenobarbital in the ED -06/19/22--received ativan 15 mg in last 8 hours with continued agitation -06/19/22--transfer to SDU and start precedex>>stabilized -06/22/22>>>weaned precedex off in early am;  Use intermittent ativan for withdrawal symptoms;  Started po librium --continues to improve on po librium --I informed patient not to drive until he follows up with neurology  Hyponatremia -due to volume depletion and poor solute intake -had 3-4 episodes emesis prior to admission -started on IV NS>>improved    QT prolongation - Secondary to withdrawals and hypokalemia -Monitor on telemetry -Avoid QT prolonging agents when possible -8/27--personally reviewed EKG--sinus, QTc = 422  Loss of consciousness (HCC) Pt had alcohol withdrawal seizure -He did bite his tongue -No longer taking Wellbutrin although it is on his med list -EEG--no seizure -no seizure since admission -I have informed patient that he should not drive  Hypokalemia - replete -mag 2.0  GERD (gastroesophageal reflux disease) - Continue Protonix  Transaminitis - Likely alcoholic hepatitis and ASH - Right upper quadrant ultrasound--echogenic liver - History of hepatitis C finished Epclusa - Continue to monitor>>trending down         Consultants: none Procedures performed: none  Disposition: Home Diet recommendation:  Regular diet DISCHARGE MEDICATION: Allergies as of 06/23/2022   No Known Allergies      Medication List     STOP taking these medications    acyclovir 400 MG tablet Commonly known as: ZOVIRAX   buPROPion 150 MG 24 hr tablet Commonly known as: WELLBUTRIN XL   Sofosbuvir-Velpatasvir 400-100 MG Tabs Commonly known as: Epclusa   traZODone 50 MG tablet Commonly known as: DESYREL        TAKE these medications    chlordiazePOXIDE 25 MG capsule Commonly known as: LIBRIUM Take 1 capsule (25 mg total) by mouth 2 (two) times daily.        Follow-up Information     [facility] Follow up.   Why: They will call to schedulue you doctors appointment.               Discharge Exam: Filed Weights   06/20/22 0408 06/21/22 0500 06/22/22 0500  Weight: 89.1 kg 89.4 kg 88.5 kg   HEENT:  [place]/AT, No thrush, no icterus CV:  RRR, no rub, no S3, no S4 Lung:  CTA, no wheeze, no rhonchi Abd:  soft/+BS, NT Ext:  No edema, no lymphangitis, no synovitis, no rash   Condition at discharge: stable  The results of significant diagnostics from this hospitalization (including imaging, microbiology, ancillary and laboratory) are listed below for reference.   Imaging Studies: EEG adult  Result Date: 07-13-2022 Charlsie Quest, MD     July 13, 2022  8:25 AM Patient Name: Trevor [name] MRN: [id number] Epilepsy Attending: Charlsie Quest Referring Physician/Provider: Lilyan Gilford, DO Date: 06/18/2022 Duration: 22.59 mins Patient history: 41 year old male with seizure in the setting of alcohol withdrawal.  EEG to evaluate for seizure. Level of alertness: Awake AEDs during EEG study: Ativan Technical aspects: This EEG study was done with scalp electrodes positioned according to the 10-20 International system of electrode placement. Electrical activity was reviewed with band pass filter of 1-70Hz , sensitivity of 7 uV/mm, display speed of 46mm/sec with a 60Hz  notched filter applied as appropriate. EEG data were recorded continuously and digitally stored.  Video monitoring was available and reviewed as appropriate. Description: The posterior dominant rhythm consists of 8 Hz activity of moderate voltage (25-35 uV) seen predominantly in posterior head regions, symmetric and reactive to eye opening and eye closing. Hyperventilation and photic stimulation were not performed.   IMPRESSION:  This study is within normal limits. No seizures or epileptiform discharges were seen throughout the recording. A normal interictal EEG does not exclude nor support the diagnosis of epilepsy.   ECHOCARDIOGRAM COMPLETE  Result Date: 06/18/2022    ECHOCARDIOGRAM REPORT   Patient Name:   Trevor [name] Date of Exam: 06/18/2022 Medical Rec #:  06/20/2022         Height:       69.0 in Accession #:    [id number]        Weight:       196.6 lb Date of Birth:  1980-11-09         BSA:          2.051 m Patient Age:    41 years          BP:           106/76 mmHg Patient Gender: M                 HR:           98 bpm. Exam Location:  04/16/1981  [facility] Procedure: 2D Echo, Cardiac Doppler and Color Doppler Indications:    Syncope  History:        Patient has no prior history of Echocardiogram examinations.                 Signs/Symptoms:Syncope. Etoh abuse.  Sonographer:    Mikki Harbor Referring Phys: [phone] Ezelle Surprenant IMPRESSIONS  1. Left ventricular ejection fraction, by estimation, is 55 to 60%. The left ventricle has normal function. The left ventricle has no regional wall motion abnormalities. Left ventricular diastolic parameters were normal.  2. Right ventricular systolic function is normal. The right ventricular size is mildly enlarged. Tricuspid regurgitation signal is inadequate for assessing PA pressure.  3. The mitral valve is grossly normal. Trivial mitral valve regurgitation. No evidence of mitral stenosis.  4. The aortic valve is tricuspid. Aortic valve regurgitation is not visualized. No aortic stenosis is present.  5. The inferior vena cava is normal in size with greater than 50% respiratory variability, suggesting right atrial pressure of 3 mmHg. Conclusion(s)/Recommendation(s): Normal biventricular function without evidence of hemodynamically significant valvular heart disease. FINDINGS  Left Ventricle: Left ventricular ejection fraction, by estimation, is 55 to 60%. The left ventricle has normal  function. The left ventricle has no regional wall motion abnormalities. The left ventricular internal cavity size was normal in size. There is  no left ventricular hypertrophy. Left ventricular diastolic parameters were normal. Right Ventricle: The right ventricular size is mildly enlarged. No increase in right ventricular wall thickness. Right ventricular systolic function is normal. Tricuspid regurgitation signal is inadequate for assessing PA pressure. Left Atrium: Left atrial size was normal in size. Right Atrium: Right atrial size was normal in size. Pericardium: There is no evidence of pericardial effusion. Mitral Valve: The mitral valve is grossly normal. Trivial mitral valve regurgitation. No evidence of mitral valve stenosis. MV peak gradient, 1.0 mmHg. The mean mitral valve gradient is 0.0 mmHg. Tricuspid Valve: The tricuspid valve is grossly normal. Tricuspid valve regurgitation is trivial. No evidence of tricuspid stenosis. Aortic Valve: The aortic valve is tricuspid. Aortic valve regurgitation is not visualized. No aortic stenosis is present. Aortic valve mean gradient measures 2.0 mmHg. Aortic valve peak gradient measures 3.7 mmHg. Aortic valve area, by VTI measures 4.34 cm. Pulmonic Valve: The pulmonic valve was grossly normal. Pulmonic valve regurgitation is trivial. No evidence of pulmonic stenosis. Aorta: The aortic root is normal in size and structure. Venous: The inferior vena cava is normal in size with greater than 50% respiratory variability, suggesting right atrial pressure of 3 mmHg. IAS/Shunts: The atrial septum is grossly normal.  LEFT VENTRICLE PLAX 2D LVIDd:         4.90 cm     Diastology LVIDs:         3.10 cm     LV e' medial:    7.94 cm/s LV PW:         1.10 cm     LV E/e' medial:  5.1 LV IVS:        1.10 cm     LV e' lateral:   7.29 cm/s LVOT diam:     2.30 cm     LV E/e' lateral: 5.6 LV SV:         69 LV SV Index:   34 LVOT Area:     4.15 cm  LV Volumes (MOD) LV vol d, MOD A2C:  84.3 ml LV vol d, MOD A4C: 85.2 ml LV vol s, MOD A2C: 43.2 ml  LV vol s, MOD A4C: 41.6 ml LV SV MOD A2C:     41.1 ml LV SV MOD A4C:     85.2 ml LV SV MOD BP:      44.2 ml RIGHT VENTRICLE RV Basal diam:  3.70 cm RV Mid diam:    3.80 cm RV S prime:     10.40 cm/s TAPSE (M-mode): 1.8 cm LEFT ATRIUM             Index        RIGHT ATRIUM           Index LA diam:        3.80 cm 1.85 cm/m   RA Area:     17.30 cm LA Vol (A2C):   46.6 ml 22.72 ml/m  RA Volume:   47.60 ml  23.21 ml/m LA Vol (A4C):   41.5 ml 20.23 ml/m LA Biplane Vol: 46.6 ml 22.72 ml/m  AORTIC VALVE                    PULMONIC VALVE AV Area (Vmax):    4.03 cm     PV Vmax:       0.58 m/s AV Area (Vmean):   3.00 cm     PV Peak grad:  1.4 mmHg AV Area (VTI):     4.34 cm AV Vmax:           95.70 cm/s AV Vmean:          70.300 cm/s AV VTI:            0.160 m AV Peak Grad:      3.7 mmHg AV Mean Grad:      2.0 mmHg LVOT Vmax:         92.90 cm/s LVOT Vmean:        50.700 cm/s LVOT VTI:          0.167 m LVOT/AV VTI ratio: 1.04  AORTA Ao Root diam: 3.80 cm MITRAL VALVE MV Area (PHT): 4.80 cm    SHUNTS MV Area VTI:   5.02 cm    Systemic VTI:  0.17 m MV Peak grad:  1.0 mmHg    Systemic Diam: 2.30 cm MV Mean grad:  0.0 mmHg MV Vmax:       0.51 m/s MV Vmean:      33.3 cm/s MV Decel Time: 158 msec MV E velocity: 40.60 cm/s MV A velocity: 46.80 cm/s MV E/A ratio:  0.87 Lennie OdorWesley O'Neal MD Electronically signed by Lennie OdorWesley O'Neal MD Signature Date/Time: 06/18/2022/3:44:41 PM    Final    US Abdomen Limited RUQ (LIVER/GB)  Result Date: 06/18/2022 CLINICAL DATA:  Transaminitis.  History of hepatitis C. EXAM: ULTRASOUND ABDOMEN LIMITED RIGHT UPPER QUADRANT COMPARISON:  None Available. FINDINGS: Gallbladder: No gallstones or wall thickening visualized. No sonographic Murphy sign noted by sonographer. Common bile duct: Diameter: 6 mm Liver: Diffusely increased parenchymal echogenicity without a focal lesion identified. Portal vein is patent on color Doppler imaging with  normal direction of blood flow towards the liver. Other: None. IMPRESSION: 1. Echogenic liver, nonspecific but can be seen with steatosis, chronic hepatitis, and infiltrative processes. 2. No gallstones or biliary dilatation. Electronically Signed   By: Sebastian AcheAllen  Grady M.D.   On: 06/18/2022 11:55   CT Head Wo Contrast  Result Date: 06/17/2022 CLINICAL DATA:  Altered mental status following alcohol abuse and a syncopal episode or seizure prior to admission. EXAM: CT HEAD WITHOUT CONTRAST TECHNIQUE: Contiguous axial images were obtained  from the base of the skull through the vertex without intravenous contrast. RADIATION DOSE REDUCTION: This exam was performed according to the departmental dose-optimization program which includes automated exposure control, adjustment of the mA and/or kV according to patient size and/or use of iterative reconstruction technique. COMPARISON:  None Available. FINDINGS: Brain: Normal appearing cerebral hemispheres and posterior fossa structures. Normal size and position of the ventricles. No intracranial hemorrhage, mass lesion or CT evidence of acute infarction. Vascular: No hyperdense vessel or unexpected calcification. Skull: Normal. Negative for fracture or focal lesion. Sinuses/Orbits: Unremarkable. Other: None. IMPRESSION: Normal examination. Electronically Signed   By: Beckie Salts M.D.   On: 06/17/2022 17:50    Microbiology: Results for orders placed or performed during the hospital encounter of 06/07/19  SARS CORONAVIRUS 2 Nasal Swab Aptima Multi Swab     Status: None   Collection Time: 06/07/19  3:36 PM   Specimen: Aptima Multi Swab; Nasal Swab  Result Value Ref Range Status   SARS Coronavirus 2 NEGATIVE NEGATIVE Final    Comment: (NOTE) SARS-CoV-2 target nucleic acids are NOT DETECTED. The SARS-CoV-2 RNA is generally detectable in upper and lower respiratory specimens during the acute phase of infection. Negative results do not preclude SARS-CoV-2 infection, do  not rule out co-infections with other pathogens, and should not be used as the sole basis for treatment or other patient management decisions. Negative results must be combined with clinical observations, patient history, and epidemiological information. The expected result is Negative. Fact Sheet for Patients: HairSlick.no Fact Sheet for Healthcare Providers: quierodirigir.com This test is not yet approved or cleared by the [place] FDA and  has been authorized for detection and/or diagnosis of SARS-CoV-2 by FDA under an Emergency Use Authorization (EUA). This EUA will remain  in effect (meaning this test can be used) for the duration of the COVID-19 declaration under Section 56 4(b)(1) of the Act, 21 U.S.C. section 360bbb-3(b)(1), unless the authorization is terminated or revoked sooner. Performed at [facility], [address] [place], [place] [address]     Labs: CBC: Recent Labs  Lab 06/17/22 1536 06/18/22 0404  WBC 6.1 6.3  NEUTROABS  --  4.0  HGB 17.5* 14.8  HCT 48.3 43.0  MCV 90.4 93.9  PLT 193 149*   Basic Metabolic Panel: Recent Labs  Lab 06/19/22 0344 06/20/22 0556 06/21/22 0352 06/22/22 0406 06/23/22 0351  NA 134* 138 137 138 136  K 3.1* 3.4* 3.4* 3.4* 3.4*  CL 103 105 104 106 103  CO2 24 24 25 23 24   GLUCOSE 105* 100* 117* 104* 97  BUN 8 9 11 11 14   CREATININE 0.77 0.63 0.66 0.67 0.83  CALCIUM 8.4* 9.1 8.9 8.9 8.8*  MG 2.0 1.9 1.8 2.2 2.0   Liver Function Tests: Recent Labs  Lab 06/18/22 0404 06/19/22 0344 06/20/22 0556 06/21/22 0352 06/23/22 0351  AST 134* 72* 70* 59* 54*  ALT 184* 123* 120* 106* 97*  ALKPHOS 54 48 56 53 49  BILITOT 1.5* 0.9 0.7 0.6 0.6  PROT 6.9 6.3* 7.2 7.1 7.0  ALBUMIN 3.9 3.5 3.8 3.6 3.8   CBG: Recent Labs  Lab 06/18/22 2049 06/19/22 0804 06/20/22 0805  GLUCAP 127* 146* 96    Discharge time spent: greater than 30 minutes.  Signed: 06/21/22, MD [facility] 06/23/2022

## 2022-06-23 NOTE — Progress Notes (Signed)
Patient being discharged to home.  Paper work given.  IV access removed.  Patient will wait for Dad at discharge lounge.

## 2023-09-23 ENCOUNTER — Inpatient Hospital Stay (HOSPITAL_COMMUNITY)
Admission: EM | Admit: 2023-09-23 | Discharge: 2023-09-29 | DRG: 897 | Disposition: A | Discharge status: Home / Self Care | Payer: 59 | Attending: Internal Medicine | Admitting: Internal Medicine

## 2023-09-23 ENCOUNTER — Other Ambulatory Visit: Payer: Self-pay

## 2023-09-23 DIAGNOSIS — E860 Dehydration: Secondary | ICD-10-CM | POA: Diagnosis present

## 2023-09-23 DIAGNOSIS — Y908 Blood alcohol level of 240 mg/100 ml or more: Secondary | ICD-10-CM | POA: Diagnosis present

## 2023-09-23 DIAGNOSIS — G4700 Insomnia, unspecified: Secondary | ICD-10-CM | POA: Diagnosis present

## 2023-09-23 DIAGNOSIS — R569 Unspecified convulsions: Secondary | ICD-10-CM | POA: Diagnosis present

## 2023-09-23 DIAGNOSIS — F10931 Alcohol use, unspecified with withdrawal delirium: Secondary | ICD-10-CM | POA: Diagnosis not present

## 2023-09-23 DIAGNOSIS — Z79899 Other long term (current) drug therapy: Secondary | ICD-10-CM | POA: Diagnosis not present

## 2023-09-23 DIAGNOSIS — I2489 Other forms of acute ischemic heart disease: Secondary | ICD-10-CM | POA: Diagnosis present

## 2023-09-23 DIAGNOSIS — R000 Tachycardia, unspecified: Secondary | ICD-10-CM | POA: Diagnosis present

## 2023-09-23 DIAGNOSIS — E872 Acidosis, unspecified: Secondary | ICD-10-CM | POA: Diagnosis present

## 2023-09-23 DIAGNOSIS — F10239 Alcohol dependence with withdrawal, unspecified: Secondary | ICD-10-CM | POA: Diagnosis present

## 2023-09-23 DIAGNOSIS — Z808 Family history of malignant neoplasm of other organs or systems: Secondary | ICD-10-CM

## 2023-09-23 DIAGNOSIS — F10229 Alcohol dependence with intoxication, unspecified: Secondary | ICD-10-CM | POA: Diagnosis present

## 2023-09-23 DIAGNOSIS — Z87891 Personal history of nicotine dependence: Secondary | ICD-10-CM

## 2023-09-23 DIAGNOSIS — F1020 Alcohol dependence, uncomplicated: Secondary | ICD-10-CM | POA: Diagnosis present

## 2023-09-23 DIAGNOSIS — F419 Anxiety disorder, unspecified: Secondary | ICD-10-CM | POA: Diagnosis present

## 2023-09-23 DIAGNOSIS — Z801 Family history of malignant neoplasm of trachea, bronchus and lung: Secondary | ICD-10-CM

## 2023-09-23 DIAGNOSIS — F10939 Alcohol use, unspecified with withdrawal, unspecified: Secondary | ICD-10-CM | POA: Diagnosis present

## 2023-09-23 DIAGNOSIS — F1010 Alcohol abuse, uncomplicated: Secondary | ICD-10-CM | POA: Diagnosis present

## 2023-09-23 LAB — CBC
HCT: 46.5 % (ref 39.0–52.0)
Hemoglobin: 16.9 g/dL (ref 13.0–17.0)
MCH: 33.1 pg (ref 26.0–34.0)
MCHC: 36.3 g/dL — ABNORMAL HIGH (ref 30.0–36.0)
MCV: 91.2 fL (ref 80.0–100.0)
Platelets: 185 10*3/uL (ref 150–400)
RBC: 5.1 MIL/uL (ref 4.22–5.81)
RDW: 12.9 % (ref 11.5–15.5)
WBC: 5.2 10*3/uL (ref 4.0–10.5)
nRBC: 0 % (ref 0.0–0.2)

## 2023-09-23 LAB — RAPID URINE DRUG SCREEN, HOSP PERFORMED
Amphetamines: NOT DETECTED
Barbiturates: NOT DETECTED
Benzodiazepines: NOT DETECTED
Cocaine: NOT DETECTED
Opiates: NOT DETECTED
Tetrahydrocannabinol: NOT DETECTED

## 2023-09-23 LAB — COMPREHENSIVE METABOLIC PANEL
ALT: 76 U/L — ABNORMAL HIGH (ref 0–44)
AST: 135 U/L — ABNORMAL HIGH (ref 15–41)
Albumin: 4 g/dL (ref 3.5–5.0)
Alkaline Phosphatase: 58 U/L (ref 38–126)
Anion gap: 18 — ABNORMAL HIGH (ref 5–15)
BUN: 6 mg/dL (ref 6–20)
CO2: 22 mmol/L (ref 22–32)
Calcium: 8.4 mg/dL — ABNORMAL LOW (ref 8.9–10.3)
Chloride: 99 mmol/L (ref 98–111)
Creatinine, Ser: 0.71 mg/dL (ref 0.61–1.24)
GFR, Estimated: >60 mL/min (ref >60)
Glucose, Bld: 169 mg/dL — ABNORMAL HIGH (ref 70–99)
Potassium: 3.5 mmol/L (ref 3.5–5.1)
Sodium: 139 mmol/L (ref 135–145)
Total Bilirubin: 0.9 mg/dL (ref <1.2)
Total Protein: 6.9 g/dL (ref 6.5–8.1)

## 2023-09-23 LAB — TROPONIN I (HIGH SENSITIVITY)
Troponin I (High Sensitivity): 14 ng/L (ref <18)
Troponin I (High Sensitivity): 19 ng/L — ABNORMAL HIGH (ref <18)

## 2023-09-23 LAB — PHOSPHORUS: Phosphorus: 2.2 mg/dL — ABNORMAL LOW (ref 2.5–4.6)

## 2023-09-23 LAB — MAGNESIUM: Magnesium: 1.8 mg/dL (ref 1.7–2.4)

## 2023-09-23 LAB — ETHANOL: Alcohol, Ethyl (B): 412 mg/dL (ref <10)

## 2023-09-23 LAB — HIV ANTIBODY (ROUTINE TESTING W REFLEX): HIV Screen 4th Generation wRfx: NONREACTIVE

## 2023-09-23 LAB — LIPASE, BLOOD: Lipase: 86 U/L — ABNORMAL HIGH (ref 11–51)

## 2023-09-23 MED ORDER — ESCITALOPRAM OXALATE 10 MG PO TABS
5.0000 mg | ORAL_TABLET | Freq: Every day | ORAL | Status: DC
  Administered 2023-09-23 – 2023-09-29 (×7): 5 mg via ORAL
  Filled 2023-09-23 (×8): qty 1

## 2023-09-23 MED ORDER — ONDANSETRON HCL 4 MG/2ML IJ SOLN: 4.0000 mg | Freq: Four times a day (QID) | INTRAMUSCULAR | Status: DC | PRN

## 2023-09-23 MED ORDER — ACETAMINOPHEN 650 MG RE SUPP: 650.0000 mg | Freq: Four times a day (QID) | RECTAL | Status: DC | PRN

## 2023-09-23 MED ORDER — FOLIC ACID 1 MG PO TABS
1.0000 mg | ORAL_TABLET | Freq: Every day | ORAL | Status: DC
  Administered 2023-09-23 – 2023-09-29 (×7): 1 mg via ORAL
  Filled 2023-09-23 (×8): qty 1

## 2023-09-23 MED ORDER — CLONIDINE HCL 0.1 MG PO TABS
0.1000 mg | ORAL_TABLET | Freq: Four times a day (QID) | ORAL | Status: DC | PRN
  Administered 2023-09-23 – 2023-09-25 (×2): 0.1 mg via ORAL
  Filled 2023-09-23 (×2): qty 1

## 2023-09-23 MED ORDER — LORAZEPAM 2 MG/ML IJ SOLN
0.0000 mg | Freq: Four times a day (QID) | INTRAMUSCULAR | Status: AC
  Administered 2023-09-23: 1 mg via INTRAVENOUS
  Administered 2023-09-23 – 2023-09-24 (×4): 2 mg via INTRAVENOUS
  Filled 2023-09-23 (×6): qty 1

## 2023-09-23 MED ORDER — LORAZEPAM 1 MG PO TABS
0.0000 mg | ORAL_TABLET | Freq: Two times a day (BID) | ORAL | Status: AC
  Administered 2023-09-26: 1 mg via ORAL
  Filled 2023-09-23: qty 1

## 2023-09-23 MED ORDER — THIAMINE MONONITRATE 100 MG PO TABS: 100.0000 mg | ORAL_TABLET | Freq: Every day | ORAL | Status: DC

## 2023-09-23 MED ORDER — SODIUM CHLORIDE 0.9% FLUSH
3.0000 mL | Freq: Two times a day (BID) | INTRAVENOUS | Status: DC
  Administered 2023-09-23 – 2023-09-28 (×11): 3 mL via INTRAVENOUS

## 2023-09-23 MED ORDER — ONDANSETRON HCL 4 MG PO TABS: 4.0000 mg | ORAL_TABLET | Freq: Four times a day (QID) | ORAL | Status: DC | PRN

## 2023-09-23 MED ORDER — ACETAMINOPHEN 325 MG PO TABS
650.0000 mg | ORAL_TABLET | Freq: Four times a day (QID) | ORAL | Status: DC | PRN
  Administered 2023-09-24 – 2023-09-27 (×3): 650 mg via ORAL
  Filled 2023-09-23 (×3): qty 2

## 2023-09-23 MED ORDER — LORAZEPAM 1 MG PO TABS
0.0000 mg | ORAL_TABLET | Freq: Four times a day (QID) | ORAL | Status: AC
  Administered 2023-09-23 – 2023-09-24 (×3): 2 mg via ORAL
  Filled 2023-09-23 (×3): qty 2

## 2023-09-23 MED ORDER — ADULT MULTIVITAMIN W/MINERALS CH
1.0000 | ORAL_TABLET | Freq: Every day | ORAL | Status: DC
  Administered 2023-09-23 – 2023-09-29 (×7): 1 via ORAL
  Filled 2023-09-23 (×7): qty 1

## 2023-09-23 MED ORDER — POTASSIUM PHOSPHATES 15 MMOLE/5ML IV SOLN
20.0000 mmol | Freq: Once | INTRAVENOUS | Status: AC
  Administered 2023-09-23: 20 mmol via INTRAVENOUS
  Filled 2023-09-23: qty 6.67

## 2023-09-23 MED ORDER — LACTATED RINGERS IV SOLN: INTRAVENOUS | Status: AC

## 2023-09-23 MED ORDER — MAGNESIUM SULFATE 2 GM/50ML IV SOLN
2.0000 g | Freq: Once | INTRAVENOUS | Status: AC
  Administered 2023-09-23: 2 g via INTRAVENOUS
  Filled 2023-09-23: qty 50

## 2023-09-23 MED ORDER — ENOXAPARIN SODIUM 40 MG/0.4ML IJ SOSY
40.0000 mg | PREFILLED_SYRINGE | INTRAMUSCULAR | Status: DC
  Administered 2023-09-23 – 2023-09-28 (×6): 40 mg via SUBCUTANEOUS
  Filled 2023-09-23 (×6): qty 0.4

## 2023-09-23 MED ORDER — LORAZEPAM 2 MG/ML IJ SOLN
1.0000 mg | INTRAMUSCULAR | Status: AC | PRN
  Administered 2023-09-24: 3 mg via INTRAVENOUS
  Administered 2023-09-24 (×3): 2 mg via INTRAVENOUS
  Administered 2023-09-25: 3 mg via INTRAVENOUS
  Administered 2023-09-25: 2 mg via INTRAVENOUS
  Filled 2023-09-23: qty 2
  Filled 2023-09-23 (×2): qty 1
  Filled 2023-09-23: qty 2
  Filled 2023-09-23: qty 1

## 2023-09-23 MED ORDER — THIAMINE MONONITRATE 100 MG PO TABS
100.0000 mg | ORAL_TABLET | Freq: Every day | ORAL | Status: DC
  Administered 2023-09-23: 100 mg via ORAL
  Filled 2023-09-23: qty 1

## 2023-09-23 MED ORDER — CHLORDIAZEPOXIDE HCL 5 MG PO CAPS
10.0000 mg | ORAL_CAPSULE | Freq: Three times a day (TID) | ORAL | Status: DC
  Administered 2023-09-23 – 2023-09-24 (×4): 10 mg via ORAL
  Filled 2023-09-23 (×4): qty 2

## 2023-09-23 MED ORDER — THIAMINE HCL 100 MG/ML IJ SOLN: 100.0000 mg | Freq: Every day | INTRAMUSCULAR | Status: DC

## 2023-09-23 MED ORDER — LORAZEPAM 2 MG/ML IJ SOLN: 0.0000 mg | Freq: Two times a day (BID) | INTRAMUSCULAR | Status: AC

## 2023-09-23 MED ORDER — SODIUM CHLORIDE 0.9 % IV BOLUS
1000.0000 mL | Freq: Once | INTRAVENOUS | Status: AC
  Administered 2023-09-23: 1000 mL via INTRAVENOUS

## 2023-09-23 MED ORDER — LORAZEPAM 1 MG PO TABS
1.0000 mg | ORAL_TABLET | ORAL | Status: AC | PRN
  Administered 2023-09-25 – 2023-09-26 (×4): 1 mg via ORAL
  Filled 2023-09-23 (×4): qty 1

## 2023-09-23 NOTE — ED Notes (Signed)
ED TO INPATIENT HANDOFF REPORT  ED Nurse Name and Phone #: Juliette Alcide RN [phone]  S Name/Age/Gender Trevor [name] 42 y.o. male Room/Bed: 032C/032C  Code Status   Code Status: Prior  Home/SNF/Other Home Patient oriented to: self, place, time, and situation Is this baseline? Yes   Triage Complete: Triage complete  Chief Complaint Alcohol withdrawal (HCC) [F10.939]  Triage Note Pt arrives to ED c/o alcoholic withdrawal. Pt reports being clean for 9 months but recently relapsed for the past 30 days. Pt reports drinking up to 30 mini bottles a day. Pt reports last drink was 2 hours ago. Pt reports tremors and and previous seizure-like activities. Pt denies N/V and hallucinations. Pt endorses CP    Allergies No Known Allergies  Level of Care/Admitting Diagnosis ED Disposition     ED Disposition  Admit   Condition  --   Comment  Hospital Area: [facility] [[id number]]  Level of Care: Progressive [102]  Admit to Progressive based on following criteria: ACUTE MENTAL DISORDER-RELATED Drug/Alcohol Ingestion/Overdose/Withdrawal, Suicidal Ideation/attempt requiring safety sitter and < Q2h monitoring/assessments, moderate to severe agitation that is managed with medication/sitter, CIWA-Ar score < 20.  May admit patient to [facility] or [facility] if equivalent level of care is available:: Yes  Covid Evaluation: Asymptomatic - no recent exposure (last 10 days) testing not required  Diagnosis: Alcohol withdrawal (HCC) [291.81.ICD-9-CM]  Admitting Physician: Briscoe Deutscher [[id number]]  Attending Physician: Briscoe Deutscher [[id number]]  Certification:: I certify this patient will need inpatient services for at least 2 midnights  Expected Medical Readiness: 09/26/2023          B Medical/Surgery History Past Medical History:  Diagnosis Date   Elevated liver enzymes    ETOH abuse    HSV-2 (herpes simplex virus 2) infection    Left upper chest wall; diagnosed by culture    Past Surgical History:  Procedure Laterality Date   broken arm     Left arm surgery     WISDOM TOOTH EXTRACTION       A IV Location/Drains/Wounds Patient Lines/Drains/Airways Status     Active Line/Drains/Airways     Name Placement date Placement time Site Days   Peripheral IV 09/23/23 20 G Left Antecubital 09/23/23  --  Antecubital  less than 1            Intake/Output Last 24 hours No intake or output data in the 24 hours ending 09/23/23 0549  Labs/Imaging Results for orders placed or performed during the hospital encounter of 09/23/23 (from the past 48 hour(s))  CBC     Status: Abnormal   Collection Time: 09/23/23  3:22 AM  Result Value Ref Range   WBC 5.2 4.0 - 10.5 K/uL   RBC 5.10 4.22 - 5.81 MIL/uL   Hemoglobin 16.9 13.0 - 17.0 g/dL   HCT 57.8 46.9 - 62.9 %   MCV 91.2 80.0 - 100.0 fL   MCH 33.1 26.0 - 34.0 pg   MCHC 36.3 (H) 30.0 - 36.0 g/dL   RDW 52.8 41.3 - 24.4 %   Platelets 185 150 - 400 K/uL   nRBC 0.0 0.0 - 0.2 %    Comment: Performed at [facility], [address] [place], [place] [address]  Comprehensive metabolic panel     Status: Abnormal   Collection Time: 09/23/23  3:22 AM  Result Value Ref Range   Sodium 139 135 - 145 mmol/L   Potassium 3.5 3.5 - 5.1 mmol/L  Chloride 99 98 - 111 mmol/L   CO2 22 22 - 32 mmol/L   Glucose, Bld 169 (H) 70 - 99 mg/dL    Comment: Glucose reference range applies only to samples taken after fasting for at least 8 hours.   BUN 6 6 - 20 mg/dL   Creatinine, Ser 4.78 0.61 - 1.24 mg/dL   Calcium 8.4 (L) 8.9 - 10.3 mg/dL   Total Protein 6.9 6.5 - 8.1 g/dL   Albumin 4.0 3.5 - 5.0 g/dL   AST 295 (H) 15 - 41 U/L   ALT 76 (H) 0 - 44 U/L   Alkaline Phosphatase 58 38 - 126 U/L   Total Bilirubin 0.9 <1.2 mg/dL   GFR, Estimated >62 >13 mL/min    Comment: (NOTE) Calculated using the CKD-EPI Creatinine Equation (2021)    Anion gap 18 (H) 5 - 15    Comment: Performed at [facility], [address],  [place], [place] [address]  Troponin I (High Sensitivity)     Status: None   Collection Time: 09/23/23  3:22 AM  Result Value Ref Range   Troponin I (High Sensitivity) 14 <18 ng/L    Comment: (NOTE) Elevated high sensitivity troponin I (hsTnI) values and significant  changes across serial measurements may suggest ACS but many other  chronic and acute conditions are known to elevate hsTnI results.  Refer to the "Links" section for chest pain algorithms and additional  guidance. Performed at [facility], [address], [place], [place] [address]   Ethanol     Status: Abnormal   Collection Time: 09/23/23  3:22 AM  Result Value Ref Range   Alcohol, Ethyl (B) 412 (HH) <10 mg/dL    Comment: CRITICAL RESULT CALLED TO, READ BACK BY AND VERIFIED WITH B. OSORIO, RN AT [phone] 11.27.24 JLASIGAN (NOTE) Lowest detectable limit for serum alcohol is 10 mg/dL.  For medical purposes only. Performed at [facility], [address] [place], [place] [address]   Lipase, blood     Status: Abnormal   Collection Time: 09/23/23  3:22 AM  Result Value Ref Range   Lipase 86 (H) 11 - 51 U/L    Comment: Performed at [facility], [address] [place], [place] [address]   No results found.  Pending Labs Unresulted Labs (From admission, onward)    None       Vitals/Pain Today's Vitals   09/23/23 0414 09/23/23 0415 09/23/23 0500 09/23/23 0545  BP:  (!) 141/82 118/77 119/85  Pulse:  (!) 111 (!) 110 (!) 111  Resp:   18 18  Temp:   98.3 F (36.8 C)   SpO2:   94% 95%  Weight:      Height:      PainSc: 5        Isolation Precautions No active isolations  Medications Medications  LORazepam (ATIVAN) injection 0-4 mg ( Intravenous See Alternative 09/23/23 0424)    Or  LORazepam (ATIVAN) tablet 0-4 mg (2 mg Oral Given 09/23/23 0424)  LORazepam (ATIVAN) injection 0-4 mg (has no administration in time range)    Or  LORazepam (ATIVAN) tablet 0-4 mg (has no administration in time  range)  thiamine (VITAMIN B1) tablet 100 mg (has no administration in time range)    Or  thiamine (VITAMIN B1) injection 100 mg (has no administration in time range)    Mobility walks     Focused Assessments CIWA: 9   R Recommendations: See  Admitting Provider Note  Report given to:   Additional Notes: Pt A&Ox4. Sober for 9 months and then heavily drinking for past 30 days. Continent. Ambulatory.

## 2023-09-23 NOTE — ED Provider Notes (Signed)
[facility] Emergency Department Provider Note MRN:  [id number]  Arrival date & time: 09/23/23     Chief Complaint   Withdrawal   History of Present Illness   Trevor [name] is a 42 y.o. year-old male presents to the ED with chief complaint of alcohol withdrawal.  He states that he drinks 30 mini bottles of liquor per day.  States that his last drink was about 3 hours ago.  States that he tried to taper off the ETOH last night, but ended up having severe withdrawal symptoms.  He states that he drank again today.  Reports feeling anxious and tremulous.  He doesn't have any hallucinations.  No vomiting.  Reports hx of withdrawal seizures.  History provided by patient.   Review of Systems  Pertinent positive and negative review of systems noted in HPI.    Physical Exam   Vitals:   09/23/23 0500 09/23/23 0545  BP: 118/77 119/85  Pulse: (!) 110 (!) 111  Resp: 18 18  Temp: 98.3 F (36.8 C)   SpO2: 94% 95%    CONSTITUTIONAL:  anxious-appearing, NAD NEURO:  Alert and oriented x 3, CN 3-12 grossly intact EYES:  eyes equal and reactive ENT/NECK:  Supple, no stridor  CARDIO:  tachycardic, regular rhythm, appears well-perfused  PULM:  No respiratory distress, CTAB GI/GU:  non-distended,  MSK/SPINE:  No gross deformities, no edema, moves all extremities  SKIN:  no rash, atraumatic   *Additional and/or pertinent findings included in MDM below  Diagnostic and Interventional Summary    EKG Interpretation Date/Time:  Wednesday September 23 2023 02:55:45 EST Ventricular Rate:  133 PR Interval:  148 QRS Duration:  86 QT Interval:  288 QTC Calculation: 428 R Axis:   90  Text Interpretation: Sinus tachycardia Rightward axis Borderline ECG Confirmed by Tilden Fossa [phone]) on 09/23/2023 5:15:55 AM       Labs Reviewed  CBC - Abnormal; Notable for the following components:      Result Value   MCHC 36.3 (*)    All other components within normal  limits  COMPREHENSIVE METABOLIC PANEL - Abnormal; Notable for the following components:   Glucose, Bld 169 (*)    Calcium 8.4 (*)    AST 135 (*)    ALT 76 (*)    Anion gap 18 (*)    All other components within normal limits  ETHANOL - Abnormal; Notable for the following components:   Alcohol, Ethyl (B) 412 (*)    All other components within normal limits  LIPASE, BLOOD - Abnormal; Notable for the following components:   Lipase 86 (*)    All other components within normal limits  TROPONIN I (HIGH SENSITIVITY)  TROPONIN I (HIGH SENSITIVITY)    No orders to display    Medications  LORazepam (ATIVAN) injection 0-4 mg ( Intravenous See Alternative 09/23/23 0424)    Or  LORazepam (ATIVAN) tablet 0-4 mg (2 mg Oral Given 09/23/23 0424)  LORazepam (ATIVAN) injection 0-4 mg (has no administration in time range)    Or  LORazepam (ATIVAN) tablet 0-4 mg (has no administration in time range)  thiamine (VITAMIN B1) tablet 100 mg (has no administration in time range)    Or  thiamine (VITAMIN B1) injection 100 mg (has no administration in time range)     Procedures  /  Critical Care .Critical Care  Performed by: Roxy Horseman, PA-C Authorized by: Roxy Horseman, PA-C   Critical care provider statement:    Critical care time (  minutes):  32   Critical care was necessary to treat or prevent imminent or life-threatening deterioration of the following conditions:  Toxidrome   Critical care was time spent personally by me on the following activities:  Development of treatment plan with patient or surrogate, discussions with consultants, evaluation of patient's response to treatment, examination of patient, ordering and review of laboratory studies, ordering and review of radiographic studies, ordering and performing treatments and interventions, pulse oximetry, re-evaluation of patient's condition and review of old charts   ED Course and Medical Decision Making  I have reviewed the triage  vital signs, the nursing notes, and pertinent available records from the EMR.  Social Determinants Affecting Complexity of Care: Patient has no clinically significant social determinants affecting this chief complaint..   ED Course:    Medical Decision Making Patient here with withdrawal symptoms.  Last drink was only a few hours ago.  His ETOH is extremely high at 412, nevertheless, he is exhibiting symptoms consistent with acute withdrawal.  I discussed the case with Dr. Madilyn Hook, who agrees that he is high risk for severe withdrawal and agrees with plan for admission.     Amount and/or Complexity of Data Reviewed Labs: ordered.  Risk OTC drugs. Prescription drug management. Decision regarding hospitalization.         Consultants: I consulted with Hospitalist, Dr. Antionette Char, who is appreciated for admitting.   Treatment and Plan: Patient's exam and diagnostic results are concerning for withdrawal from ETOH.  Feel that patient will need admission to the hospital for further treatment and evaluation.  Patient discussed with attending physician, Dr. Madilyn Hook, who recommends admission.  Final Clinical Impressions(s) / ED Diagnoses     ICD-10-CM   1. Alcohol withdrawal syndrome with complication [facility])  Z61.096       ED Discharge Orders     None         Discharge Instructions Discussed with and Provided to Patient:   Discharge Instructions   None      Roxy Horseman, PA-C 09/23/23 0554    Tilden Fossa, MD 09/24/23 1018

## 2023-09-23 NOTE — TOC CM/SW Note (Signed)
Transition of Care [facility]) - Inpatient Brief Assessment   Patient Details  Name: Trevor [name] MRN: [id number] Date of Birth: Mar 08, 1981  [facility]) CM/SW Contact:    Mearl Latin, LCSW Phone Number: 09/23/2023, 3:07 PM   Clinical Narrative: Patient admitted from home with ETOH use and falls. CSW will provide community resources once patient is off of CIWA.    Transition of Care Asessment: Insurance and Status: Insurance coverage has been reviewed Patient has primary care physician: No Home environment has been reviewed: From home Prior level of function:: Independent Prior/Current Home Services: No current home services Social Determinants of Health Reivew: SDOH reviewed no interventions necessary Readmission risk has been reviewed: Yes Transition of care needs: transition of care needs identified, TOC will continue to follow

## 2023-09-23 NOTE — H&P (Addendum)
History and Physical    Patient: Trevor [name] WGN:[id number] DOB: December 08, 1980 DOA: 09/23/2023 DOS: the patient was seen and examined on 09/23/2023 PCP: Patient, No Pcp Per  Patient coming from: Home Medical readiness/discharge disposition: Pending patient response to medical treatment for withdrawal symptoms.  Patient with history of heavy alcohol use and presented with withdrawal symptoms despite an alcohol level of 412.  Will likely continue treatment and follow-up in the outpatient setting after resources provided by TOC.  Once medically ready and has completed withdrawal protocol patient will discharge back to home Social history: Patient with history of alcoholism.  Had been sober for 9 months until he restarted drinking on 08/17/2023 after his wife filed for a change in parental custody.  Patient reported that when he resumed drinking he was drinking 30 many bottles of fireball's per day.  Patient works as a Furniture conservator/restorer at [employer]  Chief Complaint:  Chief Complaint  Patient presents with   Withdrawal   HPI: Trevor [name] is a 42 y.o. male with medical history significant of recurrent alcohol abuse who presented to the hospital because of withdrawal symptoms.  He reports that he resumed drinking on 10/21.  He attempted to abruptly alcohol drinking several days ago but subsequently developed withdrawal symptoms.  He reports witnessed seizures as well as severe extremity tremors.  He did attempt to start drinking again 2 days ago to see if that would minimize his withdrawal symptoms but unfortunately this was unsuccessful.  Previously while in [place] patient had undergone inpatient treatment for alcohol use disorder as well as management of withdrawal and at that time was treated with Lexapro and Lamictal.  Once his outpatient therapy was initiated these 2 medications were withdrawn.  Hospitalist service has been asked to evaluate the patient for admission.  Upon my evaluation  of the patient he was pleasant and having extremity tremors which was affecting his ability to eat somewhat.  He was very forthcoming with his history.  He states that typically his brother, who has been sober for 3 years, is his usual support system but he has not had regular contact with him for the past several months.  He states he does have several friends who he can contact.  He does not have any formal nonfamily support such as with AA although he has been affiliated with AA in the past.  He has not had any fevers chills abdominal pain nausea or vomiting or diarrhea.   Review of Systems: As mentioned in the history of present illness. All other systems reviewed and are negative. Past Medical History:  Diagnosis Date   Elevated liver enzymes    ETOH abuse    HSV-2 (herpes simplex virus 2) infection    Left upper chest wall; diagnosed by culture   Past Surgical History:  Procedure Laterality Date   broken arm     Left arm surgery     WISDOM TOOTH EXTRACTION     Social History:  reports that he has never smoked. He has quit using smokeless tobacco.  His smokeless tobacco use included snuff. He reports current alcohol use of about 84.0 standard drinks of alcohol per week. He reports that he does not use drugs.  No Known Allergies  Family History  Problem Relation Age of Onset   Melanoma Mother    Cancer Mother        skin   Cancer Maternal Grandmother        lung   Cancer Maternal Grandfather  lung    Prior to Admission medications   Medication Sig Start Date End Date Taking? Authorizing Provider  chlordiazePOXIDE (LIBRIUM) 25 MG capsule Take 1 capsule (25 mg total) by mouth 2 (two) times daily. 06/23/22   Catarina Hartshorn, MD    Physical Exam: Vitals:   09/23/23 0545 09/23/23 0630 09/23/23 0631 09/23/23 0740  BP: 119/85  123/85 116/80  Pulse: (!) 111  (!) 114 (!) 103  Resp: 18  18 (!) 26  Temp:    (!) 97.4 F (36.3 C)  TempSrc:    Oral  SpO2: 95%     Weight:  72.6 kg     Height:  5\' 9"  (1.753 m)     Constitutional: NAD, calm, comfortable Respiratory: clear to auscultation bilaterally, no wheezing, no crackles. Normal respiratory effort. No accessory muscle use.  Cardiovascular: Regular rate and rhythm, no murmurs / rubs / gallops. No extremity edema. 2+ pedal pulses.  Abdomen: no tenderness, no masses palpated. No hepatosplenomegaly. Bowel sounds positive.  Musculoskeletal: no clubbing / cyanosis. No joint deformity upper and lower extremities. Good ROM, no contractures. Normal muscle tone.  Skin: no rashes, lesions, ulcers. No induration Neurologic: CN 2-12 grossly intact. Sensation intact, Strength 5/5 x all 4 extremities.  Psychiatric: Normal judgment and insight. Alert and oriented x 3. Normal mood.     Data Reviewed:  Sodium 139, potassium 3.5, glucose 169, BUN 6, creatinine 0.71, anion gap 18, phosphorus 2.2, magnesium 1.8, lipase 86, AST 135, ALT 76  Troponin 14 and 19  White count 5.2 differential not obtained, hemoglobin 16.9, platelets 185,000  Alcohol level 412  EKG sinus tach with normal QTc  Assessment and Plan: Acute alcohol withdrawal syndrome Patient with history of long standing alcohol abuse including inpatient treatment Recently resumed heavy alcohol use after personal stressor Attempted to self detox at home by abruptly stopping alcohol but developed witnessed seizure activity as well as severe tremors Initiate Ativan CIWA protocol with adjunctive scheduled him 10 mg 3 times daily Patient reports excellent response to Lexapro in the past which was not continued after his last inpatient treatment.  Will initiate Lexapro 5 mg daily Also experiencing bouts of sinus tachycardia with rates in the 130s therefore will initiate as needed clonidine 0.1 mg Check urine drug screen TOC consultation Seizure precautions  Elevated anion gap acidosis Likely from dehydration as well as excessive alcohol use noting glucose within normal  limits IV fluid hydration with lactated Ringer's  Elevated lipase Patient denies abdominal pain and no pain on exam Likely secondary to excessive acute alcohol abuse Repeat labs in a.m.  Elevated troponin No chest pain and EKG normal.  Likely demand ischemia from alcohol withdrawal  Transaminitis Secondary to dehydration and excessive alcohol use noting AST higher than ALT Continue to follow labs  Hypophosphatemia Mild-has been given 1 dose of IV phosphate 20 mmol Repeat phosphate level in a.m. Exam is normal, potassium is low normal Has been given IV magnesium x 1 dose   Advance Care Planning:   Code Status: Full Code   VTE prophylaxis: Lovenox  Consults: None  Family Communication: Patient only  Severity of Illness: The appropriate patient status for this patient is INPATIENT. Inpatient status is judged to be reasonable and necessary in order to provide the required intensity of service to ensure the patient's safety. The patient's presenting symptoms, physical exam findings, and initial radiographic and laboratory data in the context of their chronic comorbidities is felt to place them at high risk for further  clinical deterioration. Furthermore, it is not anticipated that the patient will be medically stable for discharge from the hospital within 2 midnights of admission.   * I certify that at the point of admission it is my clinical judgment that the patient will require inpatient hospital care spanning beyond 2 midnights from the point of admission due to high intensity of service, high risk for further deterioration and high frequency of surveillance required.*  Author: Junious Silk, NP 09/23/2023 9:30 AM  For on call review [email or web address].

## 2023-09-23 NOTE — ED Triage Notes (Signed)
Pt arrives to ED c/o alcoholic withdrawal. Pt reports being clean for 9 months but recently relapsed for the past 30 days. Pt reports drinking up to 30 mini bottles a day. Pt reports last drink was 2 hours ago. Pt reports tremors and and previous seizure-like activities. Pt denies N/V and hallucinations. Pt endorses CP

## 2023-09-24 DIAGNOSIS — F10931 Alcohol use, unspecified with withdrawal delirium: Secondary | ICD-10-CM | POA: Diagnosis not present

## 2023-09-24 LAB — CBC WITH DIFFERENTIAL/PLATELET
Abs Immature Granulocytes: 0.01 10*3/uL (ref 0.00–0.07)
Basophils Absolute: 0 10*3/uL (ref 0.0–0.1)
Basophils Relative: 1 %
Eosinophils Absolute: 0 10*3/uL (ref 0.0–0.5)
Eosinophils Relative: 1 %
HCT: 37.2 % — ABNORMAL LOW (ref 39.0–52.0)
Hemoglobin: 13.2 g/dL (ref 13.0–17.0)
Immature Granulocytes: 0 %
Lymphocytes Relative: 27 %
Lymphs Abs: 1.1 10*3/uL (ref 0.7–4.0)
MCH: 33.2 pg (ref 26.0–34.0)
MCHC: 35.5 g/dL (ref 30.0–36.0)
MCV: 93.7 fL (ref 80.0–100.0)
Monocytes Absolute: 0.4 10*3/uL (ref 0.1–1.0)
Monocytes Relative: 11 %
Neutro Abs: 2.5 10*3/uL (ref 1.7–7.7)
Neutrophils Relative %: 60 %
Platelets: 130 10*3/uL — ABNORMAL LOW (ref 150–400)
RBC: 3.97 MIL/uL — ABNORMAL LOW (ref 4.22–5.81)
RDW: 12.8 % (ref 11.5–15.5)
WBC: 4.1 10*3/uL (ref 4.0–10.5)
nRBC: 0 % (ref 0.0–0.2)

## 2023-09-24 LAB — COMPREHENSIVE METABOLIC PANEL
ALT: 54 U/L — ABNORMAL HIGH (ref 0–44)
AST: 78 U/L — ABNORMAL HIGH (ref 15–41)
Albumin: 3.2 g/dL — ABNORMAL LOW (ref 3.5–5.0)
Alkaline Phosphatase: 42 U/L (ref 38–126)
Anion gap: 8 (ref 5–15)
BUN: 9 mg/dL (ref 6–20)
CO2: 24 mmol/L (ref 22–32)
Calcium: 8.5 mg/dL — ABNORMAL LOW (ref 8.9–10.3)
Chloride: 98 mmol/L (ref 98–111)
Creatinine, Ser: 0.63 mg/dL (ref 0.61–1.24)
GFR, Estimated: >60 mL/min (ref >60)
Glucose, Bld: 94 mg/dL (ref 70–99)
Potassium: 3.8 mmol/L (ref 3.5–5.1)
Sodium: 130 mmol/L — ABNORMAL LOW (ref 135–145)
Total Bilirubin: 1.2 mg/dL — ABNORMAL HIGH (ref <1.2)
Total Protein: 5.6 g/dL — ABNORMAL LOW (ref 6.5–8.1)

## 2023-09-24 LAB — PHOSPHORUS: Phosphorus: 3.1 mg/dL (ref 2.5–4.6)

## 2023-09-24 LAB — LIPASE, BLOOD: Lipase: 92 U/L — ABNORMAL HIGH (ref 11–51)

## 2023-09-24 LAB — MAGNESIUM: Magnesium: 1.6 mg/dL — ABNORMAL LOW (ref 1.7–2.4)

## 2023-09-24 MED ORDER — HYDROMORPHONE HCL 1 MG/ML IJ SOLN: 0.5000 mg | INTRAMUSCULAR | Status: DC | PRN

## 2023-09-24 MED ORDER — THIAMINE MONONITRATE 100 MG PO TABS
200.0000 mg | ORAL_TABLET | Freq: Every day | ORAL | Status: DC
  Administered 2023-09-24 – 2023-09-29 (×6): 200 mg via ORAL
  Filled 2023-09-24 (×6): qty 2

## 2023-09-24 MED ORDER — CHLORDIAZEPOXIDE HCL 5 MG PO CAPS
10.0000 mg | ORAL_CAPSULE | Freq: Four times a day (QID) | ORAL | Status: DC
  Administered 2023-09-24 (×3): 10 mg via ORAL
  Filled 2023-09-24 (×3): qty 2

## 2023-09-24 MED ORDER — THIAMINE HCL 100 MG/ML IJ SOLN
100.0000 mg | Freq: Every day | INTRAMUSCULAR | Status: DC
  Filled 2023-09-24 (×2): qty 2

## 2023-09-24 MED ORDER — LACTATED RINGERS IV SOLN: INTRAVENOUS | Status: AC

## 2023-09-24 MED ORDER — HYDROMORPHONE HCL 1 MG/ML IJ SOLN
0.5000 mg | Freq: Once | INTRAMUSCULAR | Status: AC
  Administered 2023-09-25: 0.5 mg via INTRAVENOUS
  Filled 2023-09-24: qty 0.5

## 2023-09-24 MED ORDER — MAGNESIUM SULFATE 4 GM/100ML IV SOLN
4.0000 g | Freq: Once | INTRAVENOUS | Status: AC
  Administered 2023-09-24: 4 g via INTRAVENOUS
  Filled 2023-09-24: qty 100

## 2023-09-24 NOTE — Plan of Care (Signed)
Pt has rested quietly throughout the night with no distress noted. Alert and oriented. On room air. SR/ST on the monitor. Up to BR with assist to void. Medicated once with an extra dose of ativan with relief noted. Medicated with tylenol for HA with relief noted. No other complaints  voiced.     Problem: Clinical Measurements: Goal: Cardiovascular complication will be avoided Outcome: Progressing   Problem: Coping: Goal: Level of anxiety will decrease Outcome: Progressing   Problem: Pain Management: Goal: General experience of comfort will improve Outcome: Progressing

## 2023-09-24 NOTE — Progress Notes (Signed)
PROGRESS NOTE    Trevor [name]  YNW:[id number] DOB: 1981/08/29 DOA: 09/23/2023 PCP: Patient, No Pcp Per   Chief Complaint  Patient presents with   Withdrawal    Brief Narrative:   Trevor [name] is a 42 y.o. male with medical history significant of recurrent alcohol abuse who presented to the hospital because of withdrawal symptoms, withdrawal seizures, as well alcohol intoxication  Assessment & Plan:   Principal Problem:   Alcohol withdrawal (HCC)   Alcohol abuse-intoxication with alcohol withdrawal -Patient with tremors, tachypneic, tachycardic, he did have seizures -Continue with CIWA protocol, he required 9 mg of IV Ativan on top of his scheduled Librium -Increase his Librium to 10 mg p.o. 4 times daily to prevent withdrawals, as he was tachycardic, with significant tremors, anxious when I saw him this morning will continue with current intensity of his Ativan CIWA protocol -Continue with IV fluids -Continue with thiamine -Continue with folic acid  Alcohol withdrawal seizures -Related to alcohol withdrawals, patient report he had those in the past,, continue with seizure precautions,no AED as seizures related to alcohol withdrawals, I have discussed seizure precautions with the patient no driving for the next 25-month  Elevated anion gap acidosis Likely from dehydration as well as excessive alcohol use noting glucose within normal limits Improving with IV fluids   Elevated lipase Patient denies abdominal pain and no pain on exam Likely secondary to excessive acute alcohol abuse Continue with IV fluids   Elevated troponin No chest pain and EKG normal.  Likely demand ischemia from alcohol withdrawal   Transaminitis Secondary to dehydration and excessive alcohol use noting AST higher than ALT    Hypophosphatemia Hypomagnesemia -Monitor closely and replace as needed      DVT prophylaxis: (Lovenox) Code Status: (Full) Family Communication: (none at  bedside) Disposition:   Status is: Inpatient    Consultants:  none   Subjective:  Reports anxiety, restlessness and discomfort, generalized body ache as well  Objective: Vitals:   09/23/23 2210 09/24/23 0033 09/24/23 0328 09/24/23 0428  BP: 110/68 131/80  117/82  Pulse: 100 80 88 89  Resp:   19   Temp:  98.8 F (37.1 C) 98.6 F (37 C)   TempSrc:  Oral Oral   SpO2:  99% 100%   Weight:      Height:        Intake/Output Summary (Last 24 hours) at 09/24/2023 1139 Last data filed at 09/24/2023 0600 Gross per 24 hour  Intake 480 ml  Output --  Net 480 ml   Filed Weights   09/23/23 0346 09/23/23 0630  Weight: 74.8 kg 72.6 kg    Examination:  Awake Alert, Oriented X 3, with tremors, anxious Symmetrical Chest wall movement, Good air movement bilaterally, CTAB RRR,No Gallops,Rubs or new Murmurs, No Parasternal Heave +ve B.Sounds, Abd Soft, No tenderness, No rebound - guarding or rigidity. No Cyanosis, Clubbing or edema, No new Rash or bruise      Data Reviewed: I have personally reviewed following labs and imaging studies  CBC: Recent Labs  Lab 09/23/23 0322 09/24/23 0337  WBC 5.2 4.1  NEUTROABS  --  2.5  HGB 16.9 13.2  HCT 46.5 37.2*  MCV 91.2 93.7  PLT 185 130*    Basic Metabolic Panel: Recent Labs  Lab 09/23/23 0322 09/23/23 0718 09/24/23 0337  NA 139  --  130*  K 3.5  --  3.8  CL 99  --  98  CO2 22  --  24  GLUCOSE 169*  --  94  BUN 6  --  9  CREATININE 0.71  --  0.63  CALCIUM 8.4*  --  8.5*  MG  --  1.8 1.6*  PHOS  --  2.2* 3.1    GFR: Estimated Creatinine Clearance: 120.3 mL/min (by C-G formula based on SCr of 0.63 mg/dL).  Liver Function Tests: Recent Labs  Lab 09/23/23 0322 09/24/23 0337  AST 135* 78*  ALT 76* 54*  ALKPHOS 58 42  BILITOT 0.9 1.2*  PROT 6.9 5.6*  ALBUMIN 4.0 3.2*    CBG: No results for input(s): "GLUCAP" in the last 168 hours.   No results found for this or any previous visit (from the past 240  hour(s)).       Radiology Studies: No results found.      Scheduled Meds:  chlordiazePOXIDE  10 mg Oral TID   enoxaparin (LOVENOX) injection  40 mg Subcutaneous Q24H   escitalopram  5 mg Oral Daily   folic acid  1 mg Oral Daily   LORazepam  0-4 mg Intravenous Q6H   Or   LORazepam  0-4 mg Oral Q6H   [START ON 09/25/2023] LORazepam  0-4 mg Intravenous Q12H   Or   [START ON 09/25/2023] LORazepam  0-4 mg Oral Q12H   multivitamin with minerals  1 tablet Oral Daily   sodium chloride flush  3 mL Intravenous Q12H   thiamine  200 mg Oral Daily   Or   thiamine  100 mg Intravenous Daily   Continuous Infusions:   LOS: 1 day      Huey Bienenstock, MD [facility]   To contact the attending provider between 7A-7P or the covering provider during after hours 7P-7A, please log into the web site [email or web address] and access using universal [place] password for that web site. If you do not have the password, please call the hospital operator.  09/24/2023, 11:39 AM

## 2023-09-24 NOTE — Progress Notes (Signed)
Entered pt room to find pt in shower, IV and telemetry had been pulled out. Pt states he did not know where he was and felt he needed a shower. Pt reoriented, and guided back to bed, given call bell and asked to called out for help ambulating.

## 2023-09-25 DIAGNOSIS — F10931 Alcohol use, unspecified with withdrawal delirium: Secondary | ICD-10-CM | POA: Diagnosis not present

## 2023-09-25 LAB — CBC
HCT: 40 % (ref 39.0–52.0)
Hemoglobin: 13.5 g/dL (ref 13.0–17.0)
MCH: 32.1 pg (ref 26.0–34.0)
MCHC: 33.8 g/dL (ref 30.0–36.0)
MCV: 95.2 fL (ref 80.0–100.0)
Platelets: 138 10*3/uL — ABNORMAL LOW (ref 150–400)
RBC: 4.2 MIL/uL — ABNORMAL LOW (ref 4.22–5.81)
RDW: 13.1 % (ref 11.5–15.5)
WBC: 5.1 10*3/uL (ref 4.0–10.5)
nRBC: 0 % (ref 0.0–0.2)

## 2023-09-25 LAB — BASIC METABOLIC PANEL
Anion gap: 10 (ref 5–15)
BUN: 12 mg/dL (ref 6–20)
CO2: 22 mmol/L (ref 22–32)
Calcium: 9 mg/dL (ref 8.9–10.3)
Chloride: 102 mmol/L (ref 98–111)
Creatinine, Ser: 0.61 mg/dL (ref 0.61–1.24)
GFR, Estimated: >60 mL/min (ref >60)
Glucose, Bld: 92 mg/dL (ref 70–99)
Potassium: 3.9 mmol/L (ref 3.5–5.1)
Sodium: 134 mmol/L — ABNORMAL LOW (ref 135–145)

## 2023-09-25 LAB — GLUCOSE, CAPILLARY
Glucose-Capillary: 88 mg/dL (ref 70–99)
Glucose-Capillary: 101 mg/dL — ABNORMAL HIGH (ref 70–99)
Glucose-Capillary: 109 mg/dL — ABNORMAL HIGH (ref 70–99)

## 2023-09-25 LAB — MAGNESIUM: Magnesium: 2.2 mg/dL (ref 1.7–2.4)

## 2023-09-25 LAB — PHOSPHORUS: Phosphorus: 5.5 mg/dL — ABNORMAL HIGH (ref 2.5–4.6)

## 2023-09-25 MED ORDER — CHLORDIAZEPOXIDE HCL 25 MG PO CAPS: 25.0000 mg | ORAL_CAPSULE | Freq: Three times a day (TID) | ORAL | Status: DC

## 2023-09-25 MED ORDER — ORAL CARE MOUTH RINSE: 15.0000 mL | OROMUCOSAL | Status: DC | PRN

## 2023-09-25 MED ORDER — CHLORDIAZEPOXIDE HCL 5 MG PO CAPS
25.0000 mg | ORAL_CAPSULE | Freq: Three times a day (TID) | ORAL | Status: DC
  Administered 2023-09-25: 25 mg via ORAL
  Filled 2023-09-25 (×2): qty 5

## 2023-09-25 MED ORDER — MAGNESIUM SULFATE 4 GM/100ML IV SOLN
4.0000 g | Freq: Once | INTRAVENOUS | Status: AC
  Administered 2023-09-25: 4 g via INTRAVENOUS
  Filled 2023-09-25: qty 100

## 2023-09-25 MED ORDER — CHLORDIAZEPOXIDE HCL 5 MG PO CAPS
10.0000 mg | ORAL_CAPSULE | Freq: Four times a day (QID) | ORAL | Status: DC
  Administered 2023-09-25 – 2023-09-26 (×7): 10 mg via ORAL
  Filled 2023-09-25 (×7): qty 2

## 2023-09-25 MED ORDER — DIAZEPAM 5 MG/ML IJ SOLN
5.0000 mg | Freq: Once | INTRAMUSCULAR | Status: AC
  Administered 2023-09-25: 5 mg via INTRAVENOUS
  Filled 2023-09-25: qty 2

## 2023-09-25 MED ORDER — DIAZEPAM 5 MG/ML IJ SOLN
10.0000 mg | Freq: Once | INTRAMUSCULAR | Status: AC
  Administered 2023-09-25: 10 mg via INTRAVENOUS
  Filled 2023-09-25: qty 2

## 2023-09-25 MED ORDER — HALOPERIDOL LACTATE 5 MG/ML IJ SOLN
5.0000 mg | Freq: Once | INTRAMUSCULAR | Status: AC
  Administered 2023-09-25: 5 mg via INTRAVENOUS
  Filled 2023-09-25: qty 1

## 2023-09-25 NOTE — Discharge Instructions (Signed)
In a time of Crisis: [facility]  Mobile Crisis Management provides immediate crisis response, 24/7.  Call [phone]  [facility] for MH/DD/SA [facility] is available 24 hours a day, 7 days a week. Customer Service Specialists will assist you to find a crisis provider that is well-matched with your needs. Your local number is: [phone]  [facility] ([facility]) IOP, individual counseling, medication management [address] [place], [place] [address] [phone] Call for intake hours; Medicaid and Uninsured    Outpatient Providers  [facility] ([facility]) Group and individual counseling. [address]  [place], [place] [address] [phone] [place]: [phone]  [place]: [phone] Medicaid and uninsured.   [facility] Offers IOP groups multiple times per week. [address] [place], [place] [address] [phone] Takes Medicaid and other insurances.   [facility]  Chemical Dependency Intensive Outpatient Program (IOP) [address] [place], [place] [address] [phone] Takes Nurse, learning disability and [place].   [facility]  IOP and Partial Hospitalization Program  [address]  [place], [place] [address] [phone] Private Insurance, [place] only for partial hospitalization  [facility] [address], [place], [place] [address] [phone] Monday-Friday. Short and Long term options. [facility] ([facility]) IOP, individual counseling, medication management [address] [place], [place] [address] [phone] Medicaid and [facility]  [facility] [address]  [place], [place] [address] [phone] Private Insurance and Self Pay   [facility] [address]  [place], [place] [address] [phone] Private Insurance, [place], and Self Pay   [facility]  [address] [place], [place] [address] [facility]  [address]  [place], [place] [address] [phone]  [facility]  [address] [place], [place] [address] [phone]  [facility] [phone] [facility] and [facility]      Residential Treatment Programs  [facility] [address] [place], [place] [address] [phone] or [phone] Detox and Residential Rehab 14 days (Medicare, Medicaid, private insurance, and self pay). No methadone. Call for pre-screen.   [facility]) [address]  [place], [place] [address] [phone] Detox (self Pay and Medicaid Limited availability) Rehab Only Male (Medicare, Medicaid, and Self Pay)-No methadone.  [facility] [address] [place], [place] [address] [phone] or [phone] Private Insurance only  [facility] [address] [place], [place] [address] Phone:  [phone] Must be detoxed 72 hours prior to admission; 28 day program.  Self-pay.  [facility] [address]  [place], [place] [phone] [employer], Medicare, [place] (not straight [place]). They offer assistance with transportation.   [facility] [address] [place],  [place], [place] [address] [phone] Christian Based Program. Men only. No insurance  [facility] [address] [place], [place] [address] Women's: [phone] Men's: [phone] No Medicaid.   [facility] [place] Locations across the [place] (mainly [place]) willing to help with transportation.  [phone] [employer]. [facility]  [address]  [place], [place] [address] [phone] Treatment Only, must make  assessment appointment, and must be sober for assessment appointment. Self pay, Medicare A and B, [facility],  must be [facility] resident. No methadone.   [address] [place], [place] [address] Phone: [phone] Inpatient 24/7 and outpatient services. Individuals with Medicaid have no obligation for a copay. Individuals with Medicare or private insurance will be obligated to meet their policy's requirement(s). Individuals who are uninsured will be eligible for a sliding or discounted scaled  [facility]  [address] [place], [place] [address] [phone] No pending legal charges, Long-term work program. No methadone. Call for assessment.  [facility]  [address], [place], [place] [address] [phone] or [phone] Commercial Insurance Only  [facility] - [phone] Private Insurance (no [place]). Males/Females, call to make referrals, multiple facilities   [facility] [address]  [place], [place] [address]  [phone] Men Only Upfront Fee   [facility]-no methadone [facility] [address] [place], [place] [address] [phone] (f)         AA Meetings Website to locate meetings (virtually or in person): [email or web address] Phone: [phone]  Syringe Services Program: Due to COVID-19, syringe services programs are likely operating under different hours with limited or no fixed site hours. Some programs may not be operating at all. Please contact the program directly using the phone numbers provided below to see if they are still operating under COVID-19.  [facility] Solution to the Opioid Problem ([facility]STOP) Fixed; mobile; peer-based Roxy Cedar [phone] [email or web address] Fixed site exchange at [facility], [address] [place], [place] [address] on Wednesdays (2:00 - 5:00 pm) and Thursdays  (4:00 - 8:00 pm). Pop-up mobile exchange locations: [employer], [address] [place], [place] [address] on Tuesdays (11:00 am - 1:00 pm) and Fridays (11:00 am - 1:00 pm)  -[facility] - [address], [place], [place] [address] on Tuesdays (2:00 - 4:00 pm) and Fridays (2:00 - 4:00 pm)  -[facility] copy - also serves Radio broadcast assistant and [employer] [place] [employer]; Fixed; mobile; peer-based; [place] Ka [phone] [email or web address] [address] [place], [place] [address] Delivery and outreach available in [place] and [place], please call for more information. Monday, Tuesday: 1:00 -7:00 pm, Thursday: 4:00 pm - 8:00 pm or Friday: 1:00 pm - 8:00 pm  Medication-Assisted Treatment (MAT):  -[facility]- services [address] and surrounding areas including [place], [place], [place], [place], [address], [place], [place], [place], [place], and [place], [place]. Options include Methadone, buprenorphine or Suboxone. [address], [place], [place] [address] Phone: [phone] Mon - Fri: 5:30am - 2:00pm Sat: 5:30am -7:30am Sun: Closed Holidays: 6:00am - 8:00am  -[facility]- We use FDA-approved medications, like methadone/suboxone/sublocade, and vivitrol. These medications are then combined with customized care plans that include individual or group counseling, toxicology, and medical care directed by on-site physicians. Accepts most insurance plans, Medicaid, and private pay.  [address] [place], [place] [address] Phone: [phone] Monday-Friday 5:00 AM - 10:00 AM Saturday 6:00 AM - 8:30 AM Sunday 6:00 AM - 7:00 AM  -[facility]- [facility] is a treatment & recovery focused program. In addition to receiving methadone medication, our clients participate in individual and group counseling as well as random drug testing. If accepted into the [facility] Opioid Program, you  will be provided several intake appointments and a physical exam [address] [place], [place] [address] Office: [phone]  Fax: [phone]  -[facility]- We put our community members at the  center of everything we do, for remote treatment services as well as in-person, from alcohol withdrawal to opioid use and more.  [address], [place], [place] [address] [phone] Monday-Wednesday: 9:00am - 5:00pm Thursday: 9:00am - 6:00pm Friday: 9:00am - 5:00pm Saturday: 9:00am - 1:00pm Sunday: Closed  -[facility] [place]) [address], [place], [place] [address] [phone]  [place] [phone] [address] [place], [place] [address]  M-W    5:00am-12:00pm Thu     5:00am-10:00am Fri       5:00am-12:00pm Sat      5:00am-8:00am Sun     Closed  $12/daily for Methadone Treatment.

## 2023-09-25 NOTE — TOC CM/SW Note (Signed)
Transition of Care [facility]) - Inpatient Brief Assessment   Patient Details  Name: Trevor [name] MRN: [id number] Date of Birth: Jun 26, 1981  [facility]) CM/SW Contact:    Mearl Latin, LCSW Phone Number: 09/25/2023, 12:59 PM   Clinical Narrative: CSW attempted to see patient for ETOH resources but he only aroused momentarily before falling back asleep. Resources on AVS for follow up.    Transition of Care Asessment: Insurance and Status: Insurance coverage has been reviewed Patient has primary care physician: No Home environment has been reviewed: From home Prior level of function:: Independent Prior/Current Home Services: No current home services Social Determinants of Health Reivew: SDOH reviewed no interventions necessary Readmission risk has been reviewed: Yes Transition of care needs: transition of care needs identified, TOC will continue to follow

## 2023-09-25 NOTE — Progress Notes (Addendum)
TRH night cross cover note:  This patient, who is here with alcohol withdrawal, undergoing CIWA protocol with symptoms based prn IV Ativan, is complaining of some breakthrough anxiety, feeling tremulous, and notes a headache.  His additional orders include the scheduled Librium and as needed clonidine.  2 assist with this headache, I also placed an additional order for prn IV Dilaudid, and also ordered a one-time additional dose of Valium 5 mg IV.  No evidence to suggest seizures at this time.  Will otherwise continue with CIWA protocol with associated existing orders for now.    Update: RN conveys that patient remains restless, anxious, experiencing auditory hallucinations following most recent dose of 3 mg of ativan. He is also repeatedly getting out of bed, but is verbally redirectable from this standpoint. I subsequently order an additional 10 mg of iv valium x 1, haldol 5 mg iv x 1, and also ordered a sitter given the redirectable nature of his behavior.     Newton Pigg, DO Hospitalist

## 2023-09-25 NOTE — Plan of Care (Signed)
Patient still experiencing Dts but improving since last night.  Oriented and compliant through day.

## 2023-09-25 NOTE — Progress Notes (Signed)
PROGRESS NOTE    Trevor [name]  VHQ:[id number] DOB: May 19, 1981 DOA: 09/23/2023 PCP: Patient, No Pcp Per   Chief Complaint  Patient presents with   Withdrawal    Brief Narrative:   Trevor [name] is a 42 y.o. male with medical history significant of recurrent alcohol abuse who presented to the hospital because of withdrawal symptoms, withdrawal seizures, as well alcohol intoxication  Assessment & Plan:   Principal Problem:   Alcohol withdrawal (HCC)   Alcohol abuse-intoxication with alcohol withdrawal -Patient with tremors, tachypneic, tachycardic, he did have seizures -Continue with CIWA protocol, he required 9 mg of IV Ativan on top of his scheduled Librium -He is with worsening withdrawal overnight, I have increased his Librium today as well.   -Continue with IV fluids -Continue with thiamine -Continue with folic acid  Alcohol withdrawal seizures -Related to alcohol withdrawals, patient report he had those in the past,, continue with seizure precautions,no AED as seizures related to alcohol withdrawals, I have discussed seizure precautions with the patient no driving for the next 78-month  Elevated anion gap acidosis Likely from dehydration as well as excessive alcohol use noting glucose within normal limits Improving with IV fluids   Elevated lipase Patient denies abdominal pain and no pain on exam Likely secondary to excessive acute alcohol abuse Continue with IV fluids   Elevated troponin No chest pain and EKG normal.  Likely demand ischemia from alcohol withdrawal   Transaminitis Secondary to dehydration and excessive alcohol use noting AST higher than ALT    Hypophosphatemia Hypomagnesemia -Monitor closely and replace as needed      DVT prophylaxis: (Lovenox) Code Status: (Full) Family Communication: (none at bedside) Disposition:   Status is: Inpatient    Consultants:  none   Subjective:  Patient with alcohol withdrawals overnight  requiring extra dosing of IV Valium, and haloperidol.  Objective: Vitals:   09/25/23 0402 09/25/23 0800 09/25/23 0900 09/25/23 1203  BP: 137/82 (!) 92/57 100/64 (!) 100/55  Pulse: 73     Resp: 17 18 14 13   Temp: 98.5 F (36.9 C)  98.5 F (36.9 C) (!) 97.3 F (36.3 C)  TempSrc: Oral  Oral Oral  SpO2: 100%  100%   Weight:      Height:        Intake/Output Summary (Last 24 hours) at 09/25/2023 1257 Last data filed at 09/25/2023 0943 Gross per 24 hour  Intake 1787.1 ml  Output --  Net 1787.1 ml   Filed Weights   09/23/23 0346 09/23/23 0630  Weight: 74.8 kg 72.6 kg    Examination:  Awake Alert, Oriented X 3, he is with tremors, appears to be more confused today Symmetrical Chest wall movement, Good air movement bilaterally, CTAB RRR,No Gallops,Rubs or new Murmurs, No Parasternal Heave +ve B.Sounds, Abd Soft, No tenderness, No rebound - guarding or rigidity. No Cyanosis, Clubbing or edema, No new Rash or bruise       Data Reviewed: I have personally reviewed following labs and imaging studies  CBC: Recent Labs  Lab 09/23/23 0322 09/24/23 0337 09/25/23 0723  WBC 5.2 4.1 5.1  NEUTROABS  --  2.5  --   HGB 16.9 13.2 13.5  HCT 46.5 37.2* 40.0  MCV 91.2 93.7 95.2  PLT 185 130* 138*    Basic Metabolic Panel: Recent Labs  Lab 09/23/23 0322 09/23/23 0718 09/24/23 0337 09/25/23 0723  NA 139  --  130* 134*  K 3.5  --  3.8 3.9  CL 99  --  98 102  CO2 22  --  24 22  GLUCOSE 169*  --  94 92  BUN 6  --  9 12  CREATININE 0.71  --  0.63 0.61  CALCIUM 8.4*  --  8.5* 9.0  MG  --  1.8 1.6* 2.2  PHOS  --  2.2* 3.1 5.5*    GFR: Estimated Creatinine Clearance: 120.3 mL/min (by C-G formula based on SCr of 0.61 mg/dL).  Liver Function Tests: Recent Labs  Lab 09/23/23 0322 09/24/23 0337  AST 135* 78*  ALT 76* 54*  ALKPHOS 58 42  BILITOT 0.9 1.2*  PROT 6.9 5.6*  ALBUMIN 4.0 3.2*    CBG: Recent Labs  Lab 09/25/23 0729 09/25/23 1207  GLUCAP 88 101*      No results found for this or any previous visit (from the past 240 hour(s)).       Radiology Studies: No results found.      Scheduled Meds:  chlordiazePOXIDE  10 mg Oral QID   enoxaparin (LOVENOX) injection  40 mg Subcutaneous Q24H   escitalopram  5 mg Oral Daily   folic acid  1 mg Oral Daily   LORazepam  0-4 mg Intravenous Q12H   Or   LORazepam  0-4 mg Oral Q12H   multivitamin with minerals  1 tablet Oral Daily   sodium chloride flush  3 mL Intravenous Q12H   thiamine  200 mg Oral Daily   Or   thiamine  100 mg Intravenous Daily   Continuous Infusions:   LOS: 2 days      Huey Bienenstock, MD [facility]   To contact the attending provider between 7A-7P or the covering provider during after hours 7P-7A, please log into the web site [email or web address] and access using universal [place] password for that web site. If you do not have the password, please call the hospital operator.  09/25/2023, 12:57 PM

## 2023-09-26 ENCOUNTER — Encounter (HOSPITAL_COMMUNITY): Payer: Self-pay | Admitting: Family Medicine

## 2023-09-26 DIAGNOSIS — F10931 Alcohol use, unspecified with withdrawal delirium: Secondary | ICD-10-CM | POA: Diagnosis not present

## 2023-09-26 LAB — BASIC METABOLIC PANEL
Anion gap: 7 (ref 5–15)
BUN: 19 mg/dL (ref 6–20)
CO2: 21 mmol/L — ABNORMAL LOW (ref 22–32)
Calcium: 8.6 mg/dL — ABNORMAL LOW (ref 8.9–10.3)
Chloride: 106 mmol/L (ref 98–111)
Creatinine, Ser: 0.6 mg/dL — ABNORMAL LOW (ref 0.61–1.24)
GFR, Estimated: >60 mL/min (ref >60)
Glucose, Bld: 104 mg/dL — ABNORMAL HIGH (ref 70–99)
Potassium: 4.4 mmol/L (ref 3.5–5.1)
Sodium: 134 mmol/L — ABNORMAL LOW (ref 135–145)

## 2023-09-26 LAB — PHOSPHORUS: Phosphorus: 3.9 mg/dL (ref 2.5–4.6)

## 2023-09-26 LAB — CBC
HCT: 40.3 % (ref 39.0–52.0)
Hemoglobin: 14 g/dL (ref 13.0–17.0)
MCH: 32.9 pg (ref 26.0–34.0)
MCHC: 34.7 g/dL (ref 30.0–36.0)
MCV: 94.6 fL (ref 80.0–100.0)
Platelets: 143 10*3/uL — ABNORMAL LOW (ref 150–400)
RBC: 4.26 MIL/uL (ref 4.22–5.81)
RDW: 13.1 % (ref 11.5–15.5)
WBC: 5.2 10*3/uL (ref 4.0–10.5)
nRBC: 0 % (ref 0.0–0.2)

## 2023-09-26 LAB — GLUCOSE, CAPILLARY: Glucose-Capillary: 98 mg/dL (ref 70–99)

## 2023-09-26 LAB — MAGNESIUM: Magnesium: 2.1 mg/dL (ref 1.7–2.4)

## 2023-09-26 MED ORDER — MELATONIN 3 MG PO TABS
3.0000 mg | ORAL_TABLET | Freq: Every evening | ORAL | Status: DC | PRN
  Administered 2023-09-26 – 2023-09-28 (×2): 3 mg via ORAL
  Filled 2023-09-26 (×2): qty 1

## 2023-09-26 NOTE — Progress Notes (Signed)
TRH night cross cover note:   I was notified by RN of the patient's request for a sleep aid. I subsequently placed order for prn melatonin for insomnia.     Keelan Tripodi, DO Hospitalist  

## 2023-09-26 NOTE — Plan of Care (Signed)
  Problem: Education: Goal: Knowledge of General Education information will improve Description: Including pain rating scale, medication(s)/side effects and non-pharmacologic comfort measures Outcome: Progressing   Problem: Health Behavior/Discharge Planning: Goal: Ability to manage health-related needs will improve Outcome: Progressing   Problem: Clinical Measurements: Goal: Ability to maintain clinical measurements within normal limits will improve Outcome: Progressing Goal: Will remain free from infection Outcome: Progressing Goal: Diagnostic test results will improve Outcome: Progressing Goal: Respiratory complications will improve Outcome: Progressing Goal: Cardiovascular complication will be avoided Outcome: Progressing   Problem: Activity: Goal: Risk for activity intolerance will decrease Outcome: Progressing   Problem: Nutrition: Goal: Adequate nutrition will be maintained Outcome: Progressing   Problem: Coping: Goal: Level of anxiety will decrease Outcome: Progressing   Problem: Elimination: Goal: Will not experience complications related to bowel motility Outcome: Progressing Goal: Will not experience complications related to urinary retention Outcome: Progressing   Problem: Pain Management: Goal: General experience of comfort will improve Outcome: Progressing   Problem: Safety: Goal: Ability to remain free from injury will improve Outcome: Progressing   Problem: Skin Integrity: Goal: Risk for impaired skin integrity will decrease Outcome: Progressing   Problem: Education: Goal: Knowledge of disease or condition will improve Outcome: Progressing Goal: Understanding of discharge needs will improve Outcome: Progressing   Problem: Health Behavior/Discharge Planning: Goal: Ability to identify changes in lifestyle to reduce recurrence of condition will improve Outcome: Progressing Goal: Identification of resources available to assist in meeting health  care needs will improve Outcome: Progressing   Problem: Physical Regulation: Goal: Complications related to the disease process, condition or treatment will be avoided or minimized Outcome: Progressing   Problem: Safety: Goal: Ability to remain free from injury will improve Outcome: Progressing

## 2023-09-26 NOTE — Plan of Care (Signed)
  Problem: Education: Goal: Knowledge of General Education information will improve Description: Including pain rating scale, medication(s)/side effects and non-pharmacologic comfort measures Outcome: Progressing   Problem: Health Behavior/Discharge Planning: Goal: Ability to manage health-related needs will improve Outcome: Progressing   Problem: Clinical Measurements: Goal: Ability to maintain clinical measurements within normal limits will improve Outcome: Progressing Goal: Will remain free from infection Outcome: Progressing Goal: Diagnostic test results will improve Outcome: Progressing Goal: Respiratory complications will improve Outcome: Progressing Goal: Cardiovascular complication will be avoided Outcome: Progressing   Problem: Nutrition: Goal: Adequate nutrition will be maintained Outcome: Progressing   Problem: Coping: Goal: Level of anxiety will decrease Outcome: Progressing   Problem: Pain Management: Goal: General experience of comfort will improve Outcome: Progressing   Problem: Safety: Goal: Ability to remain free from injury will improve Outcome: Progressing   Problem: Skin Integrity: Goal: Risk for impaired skin integrity will decrease Outcome: Progressing

## 2023-09-26 NOTE — Progress Notes (Signed)
PROGRESS NOTE    Trevor [name]  PIR:[id number] DOB: 1981/08/23 DOA: 09/23/2023 PCP: Patient, No Pcp Per   Chief Complaint  Patient presents with   Withdrawal    Brief Narrative:   Trevor [name] is a 42 y.o. male with medical history significant of recurrent alcohol abuse who presented to the hospital because of withdrawal symptoms, withdrawal seizures, as well alcohol intoxication  Assessment & Plan:   Principal Problem:   Alcohol withdrawal (HCC)   Alcohol abuse-intoxication with alcohol withdrawal -Patient with tremors, tachypneic, tachycardic, he did have seizures -Continue with CIWA protocol, he required IV Ativan x 3 just this morning, will continue with current dose Librium and CIWA protocol without taper.   -Continue with IV fluids -Continue with thiamine -Continue with folic acid  Alcohol withdrawal seizures -Related to alcohol withdrawals, patient report he had those in the past,, continue with seizure precautions,no AED as seizures related to alcohol withdrawals, I have discussed seizure precautions with the patient no driving for the next 69-month  Elevated anion gap acidosis Likely from dehydration as well as excessive alcohol use noting glucose within normal limits Improving with IV fluids   Elevated lipase Patient denies abdominal pain and no pain on exam Likely secondary to excessive acute alcohol abuse Continue with IV fluids   Elevated troponin No chest pain and EKG normal.  Likely demand ischemia from alcohol withdrawal   Transaminitis Secondary to dehydration and excessive alcohol use noting AST higher than ALT    Hypophosphatemia Hypomagnesemia -Monitor closely and replace as needed      DVT prophylaxis: (Lovenox) Code Status: (Full) Family Communication: (none at bedside) Disposition:   Status is: Inpatient    Consultants:  none   Subjective:  Patient reports he is feeling better, he did receive IV Ativan few times  overnight for withdrawals  Objective: Vitals:   09/26/23 0700 09/26/23 0900 09/26/23 1208 09/26/23 1300  BP:  118/69 109/79 123/83  Pulse: 82 80 62 80  Resp:  20 15 19   Temp:  97.9 F (36.6 C) (!) 97.5 F (36.4 C)   TempSrc:  Oral Oral   SpO2:      Weight:      Height:        Intake/Output Summary (Last 24 hours) at 09/26/2023 1433 Last data filed at 09/26/2023 0645 Gross per 24 hour  Intake --  Output 800 ml  Net -800 ml   Filed Weights   09/23/23 0346 09/23/23 0630  Weight: 74.8 kg 72.6 kg    Examination:  Awake Alert, Oriented X 3, more coherent and appropriate today, still with mild tremors. Symmetrical Chest wall movement, Good air movement bilaterally, CTAB RRR,No Gallops,Rubs or new Murmurs, No Parasternal Heave +ve B.Sounds, Abd Soft, No tenderness, No rebound - guarding or rigidity. No Cyanosis, Clubbing or edema, No new Rash or bruise      Data Reviewed: I have personally reviewed following labs and imaging studies  CBC: Recent Labs  Lab 09/23/23 0322 09/24/23 0337 09/25/23 0723 09/26/23 0354  WBC 5.2 4.1 5.1 5.2  NEUTROABS  --  2.5  --   --   HGB 16.9 13.2 13.5 14.0  HCT 46.5 37.2* 40.0 40.3  MCV 91.2 93.7 95.2 94.6  PLT 185 130* 138* 143*    Basic Metabolic Panel: Recent Labs  Lab 09/23/23 0322 09/23/23 0718 09/24/23 0337 09/25/23 0723 09/26/23 0354  NA 139  --  130* 134* 134*  K 3.5  --  3.8 3.9 4.4  CL  99  --  98 102 106  CO2 22  --  24 22 21*  GLUCOSE 169*  --  94 92 104*  BUN 6  --  9 12 19   CREATININE 0.71  --  0.63 0.61 0.60*  CALCIUM 8.4*  --  8.5* 9.0 8.6*  MG  --  1.8 1.6* 2.2 2.1  PHOS  --  2.2* 3.1 5.5* 3.9    GFR: Estimated Creatinine Clearance: 120.3 mL/min (A) (by C-G formula based on SCr of 0.6 mg/dL (L)).  Liver Function Tests: Recent Labs  Lab 09/23/23 0322 09/24/23 0337  AST 135* 78*  ALT 76* 54*  ALKPHOS 58 42  BILITOT 0.9 1.2*  PROT 6.9 5.6*  ALBUMIN 4.0 3.2*    CBG: Recent Labs  Lab  09/25/23 0729 09/25/23 1207 09/25/23 1556  GLUCAP 88 101* 109*     No results found for this or any previous visit (from the past 240 hour(s)).       Radiology Studies: No results found.      Scheduled Meds:  chlordiazePOXIDE  10 mg Oral QID   enoxaparin (LOVENOX) injection  40 mg Subcutaneous Q24H   escitalopram  5 mg Oral Daily   folic acid  1 mg Oral Daily   LORazepam  0-4 mg Intravenous Q12H   Or   LORazepam  0-4 mg Oral Q12H   multivitamin with minerals  1 tablet Oral Daily   sodium chloride flush  3 mL Intravenous Q12H   thiamine  200 mg Oral Daily   Or   thiamine  100 mg Intravenous Daily   Continuous Infusions:   LOS: 3 days      Huey Bienenstock, MD [facility]   To contact the attending provider between 7A-7P or the covering provider during after hours 7P-7A, please log into the web site [email or web address] and access using universal [place] password for that web site. If you do not have the password, please call the hospital operator.  09/26/2023, 2:33 PM

## 2023-09-27 DIAGNOSIS — F10931 Alcohol use, unspecified with withdrawal delirium: Secondary | ICD-10-CM | POA: Diagnosis not present

## 2023-09-27 LAB — BASIC METABOLIC PANEL
Anion gap: 7 (ref 5–15)
BUN: 11 mg/dL (ref 6–20)
CO2: 25 mmol/L (ref 22–32)
Calcium: 9.3 mg/dL (ref 8.9–10.3)
Chloride: 104 mmol/L (ref 98–111)
Creatinine, Ser: 0.75 mg/dL (ref 0.61–1.24)
GFR, Estimated: >60 mL/min (ref >60)
Glucose, Bld: 96 mg/dL (ref 70–99)
Potassium: 4.4 mmol/L (ref 3.5–5.1)
Sodium: 136 mmol/L (ref 135–145)

## 2023-09-27 LAB — CBC
HCT: 42.1 % (ref 39.0–52.0)
Hemoglobin: 14.5 g/dL (ref 13.0–17.0)
MCH: 32.8 pg (ref 26.0–34.0)
MCHC: 34.4 g/dL (ref 30.0–36.0)
MCV: 95.2 fL (ref 80.0–100.0)
Platelets: 163 10*3/uL (ref 150–400)
RBC: 4.42 MIL/uL (ref 4.22–5.81)
RDW: 13 % (ref 11.5–15.5)
WBC: 5.4 10*3/uL (ref 4.0–10.5)
nRBC: 0 % (ref 0.0–0.2)

## 2023-09-27 LAB — PHOSPHORUS: Phosphorus: 4.5 mg/dL (ref 2.5–4.6)

## 2023-09-27 LAB — MAGNESIUM: Magnesium: 2.2 mg/dL (ref 1.7–2.4)

## 2023-09-27 MED ORDER — LORAZEPAM 2 MG/ML IJ SOLN
1.0000 mg | INTRAMUSCULAR | Status: DC | PRN
  Administered 2023-09-27 – 2023-09-28 (×2): 2 mg via INTRAVENOUS
  Filled 2023-09-27 (×2): qty 1

## 2023-09-27 MED ORDER — CHLORDIAZEPOXIDE HCL 5 MG PO CAPS
10.0000 mg | ORAL_CAPSULE | Freq: Four times a day (QID) | ORAL | Status: DC
  Administered 2023-09-27 – 2023-09-29 (×8): 10 mg via ORAL
  Filled 2023-09-27 (×8): qty 2

## 2023-09-27 MED ORDER — LORAZEPAM 1 MG PO TABS
1.0000 mg | ORAL_TABLET | ORAL | Status: DC | PRN
  Administered 2023-09-28: 1 mg via ORAL
  Filled 2023-09-27: qty 1

## 2023-09-27 MED ORDER — CHLORDIAZEPOXIDE HCL 5 MG PO CAPS
10.0000 mg | ORAL_CAPSULE | Freq: Two times a day (BID) | ORAL | Status: DC
  Administered 2023-09-27: 10 mg via ORAL
  Filled 2023-09-27: qty 2

## 2023-09-27 NOTE — Plan of Care (Signed)

## 2023-09-27 NOTE — Plan of Care (Signed)
  Problem: Education: Goal: Knowledge of General Education information will improve Description: Including pain rating scale, medication(s)/side effects and non-pharmacologic comfort measures Outcome: Progressing   Problem: Health Behavior/Discharge Planning: Goal: Ability to manage health-related needs will improve Outcome: Progressing   Problem: Clinical Measurements: Goal: Ability to maintain clinical measurements within normal limits will improve Outcome: Progressing Goal: Will remain free from infection Outcome: Progressing Goal: Diagnostic test results will improve Outcome: Progressing Goal: Respiratory complications will improve Outcome: Progressing Goal: Cardiovascular complication will be avoided Outcome: Progressing   Problem: Activity: Goal: Risk for activity intolerance will decrease Outcome: Progressing   Problem: Nutrition: Goal: Adequate nutrition will be maintained Outcome: Progressing   Problem: Coping: Goal: Level of anxiety will decrease Outcome: Progressing   Problem: Elimination: Goal: Will not experience complications related to bowel motility Outcome: Progressing Goal: Will not experience complications related to urinary retention Outcome: Progressing   Problem: Pain Management: Goal: General experience of comfort will improve Outcome: Progressing   Problem: Safety: Goal: Ability to remain free from injury will improve Outcome: Progressing   Problem: Skin Integrity: Goal: Risk for impaired skin integrity will decrease Outcome: Progressing   Problem: Education: Goal: Knowledge of disease or condition will improve Outcome: Progressing Goal: Understanding of discharge needs will improve Outcome: Progressing   Problem: Health Behavior/Discharge Planning: Goal: Ability to identify changes in lifestyle to reduce recurrence of condition will improve Outcome: Progressing Goal: Identification of resources available to assist in meeting health  care needs will improve Outcome: Progressing   Problem: Physical Regulation: Goal: Complications related to the disease process, condition or treatment will be avoided or minimized Outcome: Progressing   Problem: Safety: Goal: Ability to remain free from injury will improve Outcome: Progressing

## 2023-09-27 NOTE — Plan of Care (Signed)
  Problem: Education: Goal: Knowledge of General Education information will improve Description: Including pain rating scale, medication(s)/side effects and non-pharmacologic comfort measures Outcome: Progressing   Problem: Health Behavior/Discharge Planning: Goal: Ability to manage health-related needs will improve Outcome: Progressing   Problem: Clinical Measurements: Goal: Ability to maintain clinical measurements within normal limits will improve Outcome: Progressing Goal: Will remain free from infection Outcome: Progressing Goal: Diagnostic test results will improve Outcome: Progressing   Problem: Activity: Goal: Risk for activity intolerance will decrease Outcome: Progressing   Problem: Coping: Goal: Level of anxiety will decrease Outcome: Progressing   Problem: Pain Management: Goal: General experience of comfort will improve Outcome: Progressing   Problem: Safety: Goal: Ability to remain free from injury will improve Outcome: Progressing   Problem: Education: Goal: Knowledge of disease or condition will improve Outcome: Progressing Goal: Understanding of discharge needs will improve Outcome: Progressing   Problem: Physical Regulation: Goal: Complications related to the disease process, condition or treatment will be avoided or minimized Outcome: Progressing   Problem: Safety: Goal: Ability to remain free from injury will improve Outcome: Progressing

## 2023-09-27 NOTE — Progress Notes (Signed)
PROGRESS NOTE    Trevor [name]  MVH:[id number] DOB: 03/06/1981 DOA: 09/23/2023 PCP: Patient, No Pcp Per   Chief Complaint  Patient presents with   Withdrawal    Brief Narrative:   Trevor [name] is a 42 y.o. male with medical history significant of recurrent alcohol abuse who presented to the hospital because of withdrawal symptoms, withdrawal seizures, as well alcohol intoxication  Assessment & Plan:   Principal Problem:   Alcohol withdrawal (HCC)   Alcohol abuse-intoxication with alcohol withdrawal -Patient with tremors, tachypneic, tachycardic, he did have seizures -Continue with CIWA protocol, CIWA protocol has expired today, and I did decrease his Librium this morning as he was doing good, but in early afternoon patient was more restless, anxious with tremors, she is resumed back on Librium 10 mg p.o. 4 times daily and resumed again on CIWA protocol as his score was 10 . -Continue with thiamine -Continue with folic acid  Alcohol withdrawal seizures -Related to alcohol withdrawals, patient report he had those in the past,, continue with seizure precautions,no AED as seizures related to alcohol withdrawals, I have discussed seizure precautions with the patient no driving for the next 39-month  Elevated anion gap acidosis Likely from dehydration as well as excessive alcohol use noting glucose within normal limits Improving with IV fluids   Elevated lipase Patient denies abdominal pain and no pain on exam Likely secondary to excessive acute alcohol abuse Continue with IV fluids   Elevated troponin No chest pain and EKG normal.  Likely demand ischemia from alcohol withdrawal   Transaminitis Secondary to dehydration and excessive alcohol use noting AST higher than ALT    Hypophosphatemia Hypomagnesemia -Monitor closely and replace as needed      DVT prophylaxis: (Lovenox) Code Status: (Full) Family Communication: discussed with father at  bedside Disposition:   Status is: Inpatient    Consultants:  none   Subjective:  Had a good night sleep, no significant events, but has become more restless, anxious after decreasing his Librium and holding his Ativan  Objective: Vitals:   09/27/23 0100 09/27/23 0400 09/27/23 0820 09/27/23 1252  BP: 107/67 (!) 90/56 112/67 118/74  Pulse:   84 77  Resp: 14 14 13 16   Temp:  98 F (36.7 C) 98.6 F (37 C) 97.9 F (36.6 C)  TempSrc:  Oral Oral Oral  SpO2:  100%  98%  Weight:      Height:        Intake/Output Summary (Last 24 hours) at 09/27/2023 1342 Last data filed at 09/26/2023 2012 Gross per 24 hour  Intake --  Output 1250 ml  Net -1250 ml   Filed Weights   09/23/23 0346 09/23/23 0630  Weight: 74.8 kg 72.6 kg    Examination:  Awake Alert, Oriented X 3, anxious with tremors (upon reevaluation this afternoon) symmetrical Chest wall movement, Good air movement bilaterally, CTAB RRR,No Gallops,Rubs or new Murmurs, No Parasternal Heave +ve B.Sounds, Abd Soft, No tenderness, No rebound - guarding or rigidity. No Cyanosis, Clubbing or edema, No new Rash or bruise      Data Reviewed: I have personally reviewed following labs and imaging studies  CBC: Recent Labs  Lab 09/23/23 0322 09/24/23 0337 09/25/23 0723 09/26/23 0354 09/27/23 0523  WBC 5.2 4.1 5.1 5.2 5.4  NEUTROABS  --  2.5  --   --   --   HGB 16.9 13.2 13.5 14.0 14.5  HCT 46.5 37.2* 40.0 40.3 42.1  MCV 91.2 93.7 95.2 94.6 95.2  PLT 185 130* 138* 143* 163    Basic Metabolic Panel: Recent Labs  Lab 09/23/23 0322 09/23/23 0718 09/24/23 0337 09/25/23 0723 09/26/23 0354 09/27/23 0523  NA 139  --  130* 134* 134* 136  K 3.5  --  3.8 3.9 4.4 4.4  CL 99  --  98 102 106 104  CO2 22  --  24 22 21* 25  GLUCOSE 169*  --  94 92 104* 96  BUN 6  --  9 12 19 11   CREATININE 0.71  --  0.63 0.61 0.60* 0.75  CALCIUM 8.4*  --  8.5* 9.0 8.6* 9.3  MG  --  1.8 1.6* 2.2 2.1 2.2  PHOS  --  2.2* 3.1 5.5* 3.9 4.5     GFR: Estimated Creatinine Clearance: 120.3 mL/min (by C-G formula based on SCr of 0.75 mg/dL).  Liver Function Tests: Recent Labs  Lab 09/23/23 0322 09/24/23 0337  AST 135* 78*  ALT 76* 54*  ALKPHOS 58 42  BILITOT 0.9 1.2*  PROT 6.9 5.6*  ALBUMIN 4.0 3.2*    CBG: Recent Labs  Lab 09/25/23 0729 09/25/23 1207 09/25/23 1556 09/26/23 1702  GLUCAP 88 101* 109* 98     No results found for this or any previous visit (from the past 240 hour(s)).       Radiology Studies: No results found.      Scheduled Meds:  chlordiazePOXIDE  10 mg Oral QID   enoxaparin (LOVENOX) injection  40 mg Subcutaneous Q24H   escitalopram  5 mg Oral Daily   folic acid  1 mg Oral Daily   multivitamin with minerals  1 tablet Oral Daily   sodium chloride flush  3 mL Intravenous Q12H   thiamine  200 mg Oral Daily   Or   thiamine  100 mg Intravenous Daily   Continuous Infusions:   LOS: 4 days      Huey Bienenstock, MD [facility]   To contact the attending provider between 7A-7P or the covering provider during after hours 7P-7A, please log into the web site [email or web address] and access using universal [place] password for that web site. If you do not have the password, please call the hospital operator.  09/27/2023, 1:42 PM

## 2023-09-28 DIAGNOSIS — F10931 Alcohol use, unspecified with withdrawal delirium: Secondary | ICD-10-CM | POA: Diagnosis not present

## 2023-09-28 LAB — CBC
HCT: 42.1 % (ref 39.0–52.0)
Hemoglobin: 14.5 g/dL (ref 13.0–17.0)
MCH: 33.3 pg (ref 26.0–34.0)
MCHC: 34.4 g/dL (ref 30.0–36.0)
MCV: 96.8 fL (ref 80.0–100.0)
Platelets: 182 10*3/uL (ref 150–400)
RBC: 4.35 MIL/uL (ref 4.22–5.81)
RDW: 12.9 % (ref 11.5–15.5)
WBC: 5.6 10*3/uL (ref 4.0–10.5)
nRBC: 0 % (ref 0.0–0.2)

## 2023-09-28 LAB — BASIC METABOLIC PANEL
Anion gap: 7 (ref 5–15)
BUN: 16 mg/dL (ref 6–20)
CO2: 24 mmol/L (ref 22–32)
Calcium: 8.8 mg/dL — ABNORMAL LOW (ref 8.9–10.3)
Chloride: 104 mmol/L (ref 98–111)
Creatinine, Ser: 0.75 mg/dL (ref 0.61–1.24)
GFR, Estimated: >60 mL/min (ref >60)
Glucose, Bld: 102 mg/dL — ABNORMAL HIGH (ref 70–99)
Potassium: 4.2 mmol/L (ref 3.5–5.1)
Sodium: 135 mmol/L (ref 135–145)

## 2023-09-28 LAB — HEPATIC FUNCTION PANEL
ALT: 38 U/L (ref 0–44)
AST: 28 U/L (ref 15–41)
Albumin: 3.7 g/dL (ref 3.5–5.0)
Alkaline Phosphatase: 40 U/L (ref 38–126)
Bilirubin, Direct: 0.1 mg/dL (ref 0.0–0.2)
Indirect Bilirubin: 0.6 mg/dL (ref 0.3–0.9)
Total Bilirubin: 0.7 mg/dL (ref <1.2)
Total Protein: 6.6 g/dL (ref 6.5–8.1)

## 2023-09-28 LAB — PHOSPHORUS: Phosphorus: 3.9 mg/dL (ref 2.5–4.6)

## 2023-09-28 LAB — LIPASE, BLOOD: Lipase: 84 U/L — ABNORMAL HIGH (ref 11–51)

## 2023-09-28 LAB — MAGNESIUM: Magnesium: 1.9 mg/dL (ref 1.7–2.4)

## 2023-09-28 NOTE — TOC Initial Note (Signed)
Transition of Care [facility]) - Initial/Assessment Note    Patient Details  Name: Trevor [name] MRN: [id number] Date of Birth: 09-01-1981  Transition of Care [facility] CM/SW Contact:    Mearl Latin, LCSW Phone Number: 09/28/2023, 12:31 PM  Clinical Narrative:                 CSW received consult regarding ETOH use. CSW met with patient who reported that he had been sober for 9 months but had an issue with his ex-wife and started drinking a few days ago. He stated he has done [facility] before which worked but he needs to get back to work so that does not fit with his schedule. He appreciated CSW providing community resources and stated that he is most interested in outpatient at this time. No other needs identified at this time.   Expected Discharge Plan: Home/Self Care Barriers to Discharge: Continued Medical Work up   Patient Goals and CMS Choice            Expected Discharge Plan and Services In-house Referral: Clinical Social Work     Living arrangements for the past 2 months: Single Family Home                                      Prior Living Arrangements/Services Living arrangements for the past 2 months: Single Family Home Lives with:: Self Patient language and need for interpreter reviewed:: Yes Do you feel safe going back to the place where you live?: Yes      Need for Family Participation in Patient Care: No (Comment) Care giver support system in place?: Yes (comment)   Criminal Activity/Legal Involvement Pertinent to Current Situation/Hospitalization: No - Comment as needed  Activities of Daily Living   ADL Screening (condition at time of admission) Independently performs ADLs?: Yes (appropriate for developmental age) Is the patient deaf or have difficulty hearing?: No Does the patient have difficulty seeing, even when wearing glasses/contacts?: No Does the patient have difficulty concentrating, remembering, or making decisions?:  No  Permission Sought/Granted                  Emotional Assessment Appearance:: Appears stated age Attitude/Demeanor/Rapport: Engaged Affect (typically observed): Accepting, Pleasant Orientation: : Oriented to Self, Oriented to Place, Oriented to Situation, Oriented to  Time Alcohol / Substance Use: Alcohol Use Psych Involvement: No (comment)  Admission diagnosis:  Alcohol withdrawal (HCC) [F10.939] Alcohol withdrawal syndrome with complication (HCC) [F10.939] Patient Active Problem List   Diagnosis Date Noted   Hyponatremia 06/19/2022   Transaminitis 06/18/2022   GERD (gastroesophageal reflux disease) 06/18/2022   Hypokalemia 06/18/2022   Loss of consciousness (HCC) 06/18/2022   QT prolongation 06/18/2022   Chronic hepatitis C without hepatic coma (HCC) 06/28/2019   Alcohol abuse 06/28/2019   Psoriasis 06/07/2019   Suicidal ideation 06/07/2019   Withdrawal symptoms, alcohol, with delirium (HCC) 06/07/2019   Alcoholic hepatitis without ascites 06/05/2019   Alcohol withdrawal (HCC) 06/03/2019   Obesity (BMI 30-39.9) 01/09/2016   HSV-2 (herpes simplex virus 2) infection    PCP:  Patient, No Pcp Per Pharmacy:   [facility] [address] [place] [address] Phone: [phone] Fax: [phone]  [id number] - [address] [place] - [address] [place] [address] Phone: [phone] Fax: [phone]     Social Determinants of Health (SDOH) Social History:  SDOH Screenings   Food Insecurity: No Food Insecurity (09/25/2023)  Housing: Low Risk  (09/25/2023)  Transportation Needs: No Transportation Needs (09/25/2023)  Utilities: Not At Risk (09/25/2023)  Tobacco Use: Medium Risk (09/26/2023)   SDOH Interventions:     Readmission Risk Interventions    06/19/2022    3:24 PM  Readmission Risk Prevention Plan  Medication Screening Complete  Transportation Screening Complete

## 2023-09-28 NOTE — Progress Notes (Signed)
PROGRESS NOTE    Trevor [name]  NWG:[id number] DOB: 1981-08-02 DOA: 09/23/2023 PCP: Patient, No Pcp Per   Chief Complaint  Patient presents with   Withdrawal    Brief Narrative:   Trevor [name] is a 42 y.o. male with medical history significant of recurrent alcohol abuse who presented to the hospital because of withdrawal symptoms, withdrawal seizures, as well alcohol intoxication  Assessment & Plan:   Principal Problem:   Alcohol withdrawal (HCC)   Alcohol abuse-intoxication with alcohol withdrawal -Patient with tremors, tachypneic, tachycardic, he did have seizures -Did not do well with tapering Librium yesterday, went back into withdrawal, but today he is looking better, so well continue with current dose of Librium, continue with current CIWA protocol, if no significant as needed Ativan requirement today hopefully we can discharge him tomorrow he is without significant Ativan requirement. -Continue with folic acid, continue with thiamine.  Alcohol withdrawal seizures -Related to alcohol withdrawals, patient report he had those in the past,, continue with seizure precautions,no AED as seizures related to alcohol withdrawals, I have discussed seizure precautions with the patient no driving for the next 33-month  Elevated anion gap acidosis Likely from dehydration as well as excessive alcohol use noting glucose within normal limits Improving with IV fluids   Elevated lipase Patient denies abdominal pain and no pain on exam Likely secondary to excessive acute alcohol abuse It is trending down   Elevated troponin No chest pain and EKG normal.  Likely demand ischemia from alcohol withdrawal   Transaminitis Secondary to dehydration and excessive alcohol use noting AST higher than ALT, this has normalized    Hypophosphatemia Hypomagnesemia -Monitor closely and replace as needed      DVT prophylaxis: (Lovenox) Code Status: (Full) Family Communication: None at  bedside today Disposition:   Status is: Inpatient    Consultants:  none   Subjective:  Significant events overnight, reports he is feeling better today  Objective: Vitals:   09/27/23 1933 09/27/23 2348 09/28/23 0446 09/28/23 0800  BP: 105/64 109/69 107/73 115/75  Pulse: (!) 59 66 72 83  Resp: 16 16 18 19   Temp:  97.9 F (36.6 C) 97.7 F (36.5 C) 98.5 F (36.9 C)  TempSrc:  Axillary Oral Oral  SpO2: 98% 99% 100%   Weight:      Height:       No intake or output data in the 24 hours ending 09/28/23 1208  Filed Weights   09/23/23 0346 09/23/23 0630  Weight: 74.8 kg 72.6 kg    Examination:  Awake Alert, Oriented X 3, highly anxious, but pleasant, no tremors Symmetrical Chest wall movement, Good air movement bilaterally, CTAB RRR,No Gallops,Rubs or new Murmurs, No Parasternal Heave +ve B.Sounds, Abd Soft, No tenderness, No rebound - guarding or rigidity. No Cyanosis, Clubbing or edema, No new Rash or bruise       Data Reviewed: I have personally reviewed following labs and imaging studies  CBC: Recent Labs  Lab 09/24/23 0337 09/25/23 0723 09/26/23 0354 09/27/23 0523 09/28/23 0417  WBC 4.1 5.1 5.2 5.4 5.6  NEUTROABS 2.5  --   --   --   --   HGB 13.2 13.5 14.0 14.5 14.5  HCT 37.2* 40.0 40.3 42.1 42.1  MCV 93.7 95.2 94.6 95.2 96.8  PLT 130* 138* 143* 163 182    Basic Metabolic Panel: Recent Labs  Lab 09/24/23 0337 09/25/23 0723 09/26/23 0354 09/27/23 0523 09/28/23 0417  NA 130* 134* 134* 136 135  K 3.8  3.9 4.4 4.4 4.2  CL 98 102 106 104 104  CO2 24 22 21* 25 24  GLUCOSE 94 92 104* 96 102*  BUN 9 12 19 11 16   CREATININE 0.63 0.61 0.60* 0.75 0.75  CALCIUM 8.5* 9.0 8.6* 9.3 8.8*  MG 1.6* 2.2 2.1 2.2 1.9  PHOS 3.1 5.5* 3.9 4.5 3.9    GFR: Estimated Creatinine Clearance: 120.3 mL/min (by C-G formula based on SCr of 0.75 mg/dL).  Liver Function Tests: Recent Labs  Lab 09/23/23 0322 09/24/23 0337 09/28/23 0847  AST 135* 78* 28  ALT 76*  54* 38  ALKPHOS 58 42 40  BILITOT 0.9 1.2* 0.7  PROT 6.9 5.6* 6.6  ALBUMIN 4.0 3.2* 3.7    CBG: Recent Labs  Lab 09/25/23 0729 09/25/23 1207 09/25/23 1556 09/26/23 1702  GLUCAP 88 101* 109* 98     No results found for this or any previous visit (from the past 240 hour(s)).       Radiology Studies: No results found.      Scheduled Meds:  chlordiazePOXIDE  10 mg Oral QID   enoxaparin (LOVENOX) injection  40 mg Subcutaneous Q24H   escitalopram  5 mg Oral Daily   folic acid  1 mg Oral Daily   multivitamin with minerals  1 tablet Oral Daily   sodium chloride flush  3 mL Intravenous Q12H   thiamine  200 mg Oral Daily   Or   thiamine  100 mg Intravenous Daily   Continuous Infusions:   LOS: 5 days      Huey Bienenstock, MD [facility]   To contact the attending provider between 7A-7P or the covering provider during after hours 7P-7A, please log into the web site [email or web address] and access using universal [place] password for that web site. If you do not have the password, please call the hospital operator.  09/28/2023, 12:08 PM

## 2023-09-28 NOTE — Plan of Care (Signed)
  Problem: Education: Goal: Knowledge of General Education information will improve Description: Including pain rating scale, medication(s)/side effects and non-pharmacologic comfort measures Outcome: Progressing   Problem: Health Behavior/Discharge Planning: Goal: Ability to manage health-related needs will improve Outcome: Progressing   Problem: Clinical Measurements: Goal: Ability to maintain clinical measurements within normal limits will improve Outcome: Progressing Goal: Will remain free from infection Outcome: Progressing Goal: Diagnostic test results will improve Outcome: Progressing Goal: Respiratory complications will improve Outcome: Progressing Goal: Cardiovascular complication will be avoided Outcome: Progressing   Problem: Activity: Goal: Risk for activity intolerance will decrease Outcome: Progressing   Problem: Nutrition: Goal: Adequate nutrition will be maintained Outcome: Progressing   Problem: Coping: Goal: Level of anxiety will decrease Outcome: Progressing   Problem: Elimination: Goal: Will not experience complications related to bowel motility Outcome: Progressing Goal: Will not experience complications related to urinary retention Outcome: Progressing   Problem: Pain Management: Goal: General experience of comfort will improve Outcome: Progressing   Problem: Safety: Goal: Ability to remain free from injury will improve Outcome: Progressing   Problem: Skin Integrity: Goal: Risk for impaired skin integrity will decrease Outcome: Progressing   Problem: Education: Goal: Knowledge of disease or condition will improve Outcome: Progressing Goal: Understanding of discharge needs will improve Outcome: Progressing   Problem: Health Behavior/Discharge Planning: Goal: Ability to identify changes in lifestyle to reduce recurrence of condition will improve Outcome: Progressing Goal: Identification of resources available to assist in meeting health  care needs will improve Outcome: Progressing   Problem: Physical Regulation: Goal: Complications related to the disease process, condition or treatment will be avoided or minimized Outcome: Progressing   Problem: Safety: Goal: Ability to remain free from injury will improve Outcome: Progressing

## 2023-09-28 NOTE — Progress Notes (Signed)
Mobility Specialist Progress Note;   09/28/23 0930  Mobility  Activity Ambulated independently in hallway  Level of Assistance Independent  Assistive Device None  Distance Ambulated (ft) 550 ft  Activity Response Tolerated well  Mobility Referral Yes  $Mobility charge 1 Mobility  Mobility Specialist Start Time (ACUTE ONLY) 0930  Mobility Specialist Stop Time (ACUTE ONLY) 0945  Mobility Specialist Time Calculation (min) (ACUTE ONLY) 15 min   Pt eager for mobility. Required no physical assistance during ambulation. Asx throughout and no c/o during session. Pt returned back to chair with all needs met.   Trevor [name] Mobility Specialist Please contact via SecureChat or Rehab Office [phone]

## 2023-09-29 ENCOUNTER — Other Ambulatory Visit (HOSPITAL_COMMUNITY): Payer: Self-pay

## 2023-09-29 DIAGNOSIS — F10931 Alcohol use, unspecified with withdrawal delirium: Secondary | ICD-10-CM | POA: Diagnosis not present

## 2023-09-29 MED ORDER — THIAMINE HCL 100 MG PO TABS
100.0000 mg | ORAL_TABLET | Freq: Every day | ORAL | 0 refills | Status: DC
  Filled 2023-09-29: qty 30, 30d supply, fill #0

## 2023-09-29 MED ORDER — ADULT MULTIVITAMIN W/MINERALS CH
1.0000 | ORAL_TABLET | Freq: Every day | ORAL | 0 refills | Status: DC
  Filled 2023-09-29: qty 30, 30d supply, fill #0

## 2023-09-29 MED ORDER — FOLIC ACID 1 MG PO TABS
1.0000 mg | ORAL_TABLET | Freq: Every day | ORAL | 0 refills | Status: DC
  Filled 2023-09-29: qty 30, 30d supply, fill #0

## 2023-09-29 MED ORDER — CHLORDIAZEPOXIDE HCL 10 MG PO CAPS
ORAL_CAPSULE | ORAL | 0 refills | Status: DC
  Filled 2023-09-29: qty 18, 9d supply, fill #0

## 2023-09-29 MED ORDER — ESCITALOPRAM OXALATE 5 MG PO TABS
5.0000 mg | ORAL_TABLET | Freq: Every day | ORAL | 0 refills | Status: DC
  Filled 2023-09-29: qty 30, 30d supply, fill #0

## 2023-09-29 NOTE — Plan of Care (Signed)
  Problem: Education: Goal: Knowledge of General Education information will improve Description: Including pain rating scale, medication(s)/side effects and non-pharmacologic comfort measures Outcome: Completed/Met   Problem: Health Behavior/Discharge Planning: Goal: Ability to manage health-related needs will improve Outcome: Completed/Met   Problem: Clinical Measurements: Goal: Ability to maintain clinical measurements within normal limits will improve Outcome: Completed/Met Goal: Will remain free from infection Outcome: Completed/Met Goal: Diagnostic test results will improve Outcome: Completed/Met Goal: Respiratory complications will improve Outcome: Completed/Met Goal: Cardiovascular complication will be avoided Outcome: Completed/Met   Problem: Activity: Goal: Risk for activity intolerance will decrease Outcome: Completed/Met   Problem: Nutrition: Goal: Adequate nutrition will be maintained Outcome: Completed/Met   Problem: Coping: Goal: Level of anxiety will decrease Outcome: Completed/Met   Problem: Elimination: Goal: Will not experience complications related to bowel motility Outcome: Completed/Met Goal: Will not experience complications related to urinary retention Outcome: Completed/Met   Problem: Pain Management: Goal: General experience of comfort will improve Outcome: Completed/Met   Problem: Safety: Goal: Ability to remain free from injury will improve Outcome: Completed/Met   Problem: Skin Integrity: Goal: Risk for impaired skin integrity will decrease Outcome: Completed/Met   Problem: Education: Goal: Knowledge of disease or condition will improve Outcome: Completed/Met Goal: Understanding of discharge needs will improve Outcome: Completed/Met   Problem: Health Behavior/Discharge Planning: Goal: Ability to identify changes in lifestyle to reduce recurrence of condition will improve Outcome: Completed/Met Goal: Identification of resources  available to assist in meeting health care needs will improve Outcome: Completed/Met   Problem: Physical Regulation: Goal: Complications related to the disease process, condition or treatment will be avoided or minimized Outcome: Completed/Met   Problem: Safety: Goal: Ability to remain free from injury will improve Outcome: Completed/Met

## 2023-09-29 NOTE — Discharge Summary (Signed)
Physician Discharge Summary  DUVON NATIONS ZOX:[id number] DOB: Oct 11, 1981 DOA: 09/23/2023  PCP: Patient, No Pcp Per  Admit date: 09/23/2023 Discharge date: 09/29/2023  Admitted From: (Home) Disposition:  (Home)  Recommendations for Outpatient Follow-up:  Follow up with PCP in 1-2 weeks Please obtain BMP/CBC in one week  Diet recommendation:  Regular   Brief/Interim Summary: Trevor [name] is a 42 y.o. male with medical history significant of recurrent alcohol abuse who presented to the hospital because of withdrawal symptoms, withdrawal seizures, as well alcohol intoxication    Alcohol abuse-intoxication with alcohol withdrawal -Patient presented with tremors, tachypneic, tachycardic, he did say he had seizures  -He was kept on both CIWA protocol, scheduled Librium, he was symptomatic, he required prolonged taper, no significant Ativan requirement yesterday, so he will be discharged on Librium with taper over next 10 days.   -He was seen by [facility] who provided resources, patient reports he already scheduled to stay at [facility] house -Continue with folic acid, continue with thiamine.   Alcohol withdrawal seizures -Related to alcohol withdrawals, patient report he had those in the past,, continue with seizure precautions,no AED as seizures related to alcohol withdrawals, I have discussed seizure precautions with the patient,  no driving for the next 78-month   Elevated anion gap acidosis Likely from dehydration as well as excessive alcohol use noting glucose within normal limits Resolved with IV fluids   Elevated lipase Patient denies abdominal pain and no pain on exam Likely secondary to excessive acute alcohol abuse It is trending down   Elevated troponin No chest pain and EKG normal.  This is demand ischemia from alcohol withdrawal   Transaminitis Secondary to dehydration and excessive alcohol use noting AST higher than ALT, this has normalized      Hypophosphatemia Hypomagnesemia -Replaced  Discharge Diagnoses:  Principal Problem:   Alcohol withdrawal (HCC) Active Problems:   Alcohol abuse    Discharge Instructions  Discharge Instructions     Diet - low sodium heart healthy   Complete by: As directed    Discharge instructions   Complete by: As directed    Follow with Primary MD  in 7 days   Get CBC, CMP,  checked  by Primary MD next visit.    Activity: As tolerated with Full fall precautions use walker/cane & assistance as needed   Disposition Home    Diet: REGULAR DIET.     On your next visit with your primary care physician please Get Medicines reviewed and adjusted.   Please request your Prim.MD to go over all Hospital Tests and Procedure/Radiological results at the follow up, please get all Hospital records sent to your Prim MD by signing hospital release before you go home.   If you experience worsening of your admission symptoms, develop shortness of breath, life threatening emergency, suicidal or homicidal thoughts you must seek medical attention immediately by calling 911 or calling your MD immediately  if symptoms less severe.  You Must read complete instructions/literature along with all the possible adverse reactions/side effects for all the Medicines you take and that have been prescribed to you. Take any new Medicines after you have completely understood and accpet all the possible adverse reactions/side effects.   Do not drive, operating heavy machinery, perform activities at heights, swimming or participation in water activities or provide baby sitting services as your were admitted for seizures,  until you have seen by Primary MD or a Neurologist and advised to do so again.  Do not drive  when taking Pain medications.    Do not take more than prescribed Pain, Sleep and Anxiety Medications  Special Instructions: If you have smoked or chewed Tobacco  in the last 2 yrs please stop smoking, stop  any regular Alcohol  and or any Recreational drug use.  Wear Seat belts while driving.   Please note  You were cared for by a hospitalist during your hospital stay. If you have any questions about your discharge medications or the care you received while you were in the hospital after you are discharged, you can call the unit and asked to speak with the hospitalist on call if the hospitalist that took care of you is not available. Once you are discharged, your primary care physician will handle any further medical issues. Please note that NO REFILLS for any discharge medications will be authorized once you are discharged, as it is imperative that you return to your primary care physician (or establish a relationship with a primary care physician if you do not have one) for your aftercare needs so that they can reassess your need for medications and monitor your lab values.   Increase activity slowly   Complete by: As directed       Allergies as of 09/29/2023   No Known Allergies      Medication List     TAKE these medications    CertaVite/Antioxidants Tabs Take 1 tablet by mouth daily.   chlordiazePOXIDE 10 MG capsule Commonly known as: LIBRIUM Take 1 tablet (10 mg) by mouth 3 times daily for 3 days, then take 1 tablet (10 mg) by mouth twice daily for 3 days, then take 1 tablet (10 mg) oral daily for 3 days   escitalopram 5 MG tablet Commonly known as: LEXAPRO Take 1 tablet (5 mg total) by mouth daily.   folic acid 1 MG tablet Commonly known as: FOLVITE Take 1 tablet (1 mg total) by mouth daily.   thiamine 100 MG tablet Commonly known as: VITAMIN B1 Take 1 tablet (100 mg total) by mouth daily.        No Known Allergies  Consultations: None   Procedures/Studies: No results found.    Subjective:  No significant events overnight, reports he is feeling much better today. Discharge Exam: Vitals:   09/28/23 2000 09/29/23 0008  BP:  110/64  Pulse: 64   Resp:  15   Temp:  97.7 F (36.5 C)  SpO2: 98%    Vitals:   09/28/23 1713 09/28/23 1800 09/28/23 2000 09/29/23 0008  BP: 120/69   110/64  Pulse: 68 84 64   Resp: 18 (!) 23 15   Temp: 98.1 F (36.7 C)   97.7 F (36.5 C)  TempSrc: Oral   Oral  SpO2: 98% 100% 98%   Weight:      Height:        General: Pt is alert, awake, not in acute distress Cardiovascular: RRR, S1/S2 +, no rubs, no gallops Respiratory: CTA bilaterally, no wheezing, no rhonchi Abdominal: Soft, NT, ND, bowel sounds + Extremities: no edema, no cyanosis    The results of significant diagnostics from this hospitalization (including imaging, microbiology, ancillary and laboratory) are listed below for reference.     Microbiology: No results found for this or any previous visit (from the past 240 hour(s)).   Labs: BNP (last 3 results) No results for input(s): "BNP" in the last 8760 hours. Basic Metabolic Panel: Recent Labs  Lab 09/24/23 [phone] 09/25/23 0723 09/26/23 0354 09/27/23 4034  09/28/23 0417  NA 130* 134* 134* 136 135  K 3.8 3.9 4.4 4.4 4.2  CL 98 102 106 104 104  CO2 24 22 21* 25 24  GLUCOSE 94 92 104* 96 102*  BUN 9 12 19 11 16   CREATININE 0.63 0.61 0.60* 0.75 0.75  CALCIUM 8.5* 9.0 8.6* 9.3 8.8*  MG 1.6* 2.2 2.1 2.2 1.9  PHOS 3.1 5.5* 3.9 4.5 3.9   Liver Function Tests: Recent Labs  Lab 09/23/23 0322 09/24/23 0337 09/28/23 0847  AST 135* 78* 28  ALT 76* 54* 38  ALKPHOS 58 42 40  BILITOT 0.9 1.2* 0.7  PROT 6.9 5.6* 6.6  ALBUMIN 4.0 3.2* 3.7   Recent Labs  Lab 09/23/23 0322 09/24/23 0337 09/28/23 0847  LIPASE 86* 92* 84*   No results for input(s): "AMMONIA" in the last 168 hours. CBC: Recent Labs  Lab 09/24/23 0337 09/25/23 0723 09/26/23 0354 09/27/23 0523 09/28/23 0417  WBC 4.1 5.1 5.2 5.4 5.6  NEUTROABS 2.5  --   --   --   --   HGB 13.2 13.5 14.0 14.5 14.5  HCT 37.2* 40.0 40.3 42.1 42.1  MCV 93.7 95.2 94.6 95.2 96.8  PLT 130* 138* 143* 163 182   Cardiac Enzymes: No  results for input(s): "CKTOTAL", "CKMB", "CKMBINDEX", "TROPONINI" in the last 168 hours. BNP: Invalid input(s): "POCBNP" CBG: Recent Labs  Lab 09/25/23 0729 09/25/23 1207 09/25/23 1556 09/26/23 1702  GLUCAP 88 101* 109* 98   D-Dimer No results for input(s): "DDIMER" in the last 72 hours. Hgb A1c No results for input(s): "HGBA1C" in the last 72 hours. Lipid Profile No results for input(s): "CHOL", "HDL", "LDLCALC", "TRIG", "CHOLHDL", "LDLDIRECT" in the last 72 hours. Thyroid function studies No results for input(s): "TSH", "T4TOTAL", "T3FREE", "THYROIDAB" in the last 72 hours.  Invalid input(s): "FREET3" Anemia work up No results for input(s): "VITAMINB12", "FOLATE", "FERRITIN", "TIBC", "IRON", "RETICCTPCT" in the last 72 hours. Urinalysis    Component Value Date/Time   COLORURINE AMBER (A) 06/18/2022 1606   APPEARANCEUR HAZY (A) 06/18/2022 1606   LABSPEC 1.009 06/18/2022 1606   PHURINE 6.0 06/18/2022 1606   GLUCOSEU NEGATIVE 06/18/2022 1606   HGBUR NEGATIVE 06/18/2022 1606   BILIRUBINUR NEGATIVE 06/18/2022 1606   BILIRUBINUR negative 08/25/2015 1008   KETONESUR 5 (A) 06/18/2022 1606   PROTEINUR NEGATIVE 06/18/2022 1606   UROBILINOGEN 0.2 08/25/2015 1008   NITRITE NEGATIVE 06/18/2022 1606   LEUKOCYTESUR NEGATIVE 06/18/2022 1606   Sepsis Labs Recent Labs  Lab 09/25/23 0723 09/26/23 0354 09/27/23 0523 09/28/23 0417  WBC 5.1 5.2 5.4 5.6   Microbiology No results found for this or any previous visit (from the past 240 hour(s)).   Time coordinating discharge: Over 30 minutes  SIGNED:   Huey Bienenstock, MD  [facility] 09/29/2023, 9:49 AM Pager   If 7PM-7AM, please contact night-coverage [email or web address] Password [email or web address]

## 2023-09-29 NOTE — Progress Notes (Signed)
Discharge instructions given. Patient verbalized understanding and all questions were answered.  ?

## 2023-10-08 ENCOUNTER — Telehealth (HOSPITAL_COMMUNITY): Payer: Self-pay

## 2023-10-08 NOTE — Telephone Encounter (Addendum)
[name] calls front desk and is routed to this therapist. Therapist confirms his identity through securing two identifiers.  Trevor [name] says he was admitted to the ED on 09/23/23 and received detox services.  He says he would like to have individual therapy for his alcohol use.  Therapist explains he can either present to the walk in clinic and informs him that this therapist will be covering next Tuesday, 10/13/23 or he can have a scheduled appointment.  [name] says he prefers to walk in on next Tuesday at 7am to begin paperwork.  Therapist provides the location that the CCA will be completed.  [name], MS, LMFT, LCAS  10/08/23

## 2023-10-22 ENCOUNTER — Other Ambulatory Visit (HOSPITAL_COMMUNITY): Payer: Self-pay

## 2023-10-22 ENCOUNTER — Ambulatory Visit (INDEPENDENT_AMBULATORY_CARE_PROVIDER_SITE_OTHER): Payer: No Payment, Other

## 2023-10-22 ENCOUNTER — Encounter (HOSPITAL_COMMUNITY): Payer: Self-pay

## 2023-10-22 DIAGNOSIS — F1020 Alcohol dependence, uncomplicated: Secondary | ICD-10-CM

## 2023-10-22 DIAGNOSIS — F902 Attention-deficit hyperactivity disorder, combined type: Secondary | ICD-10-CM | POA: Diagnosis not present

## 2023-10-22 DIAGNOSIS — F1994 Other psychoactive substance use, unspecified with psychoactive substance-induced mood disorder: Secondary | ICD-10-CM | POA: Diagnosis not present

## 2023-10-22 NOTE — Progress Notes (Addendum)
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 Other (Comment)  Referral name: SW at [facility]  Referral phone number: No data recorded  Whom do you see for routine medical problems? I don't have a doctor  Practice/Facility Name: No data recorded Practice/Facility Phone Number: No data recorded Name of Contact: No data recorded Contact Number: No data recorded Contact Fax Number: No data recorded Prescriber Name: No data recorded Prescriber Address (if known): No data recorded  What Is the Reason for Your Visit/Call Today? No data recorded How Long Has This Been Causing You Problems? > than 6 months  What Do You Feel Would Help You the Most Today? Alcohol or Drug Use Treatment   Have You Recently Been in Any Inpatient Treatment (Hospital/Detox/Crisis Center/28-Day Program)? Yes  Name/Location of Program/Hospital:[facility]  How Long Were You There? 6 days  When Were You Discharged? 09/29/23   Have You Ever Received Services From [facility] Before? Yes  Who Do You See at [facility]? has been at [facility] in the past year for Alcohol Withdrawal   Have You Recently Had Any Thoughts About Hurting Yourself? No  Are You Planning to Commit Suicide/Harm Yourself At This time? No  Have you Recently Had Thoughts About Hurting Someone Trevor [name]? No  Explanation: No data recorded  Have You Used Any Alcohol or Drugs in the Past 24 Hours? No  How Long Ago Did You Use Drugs or Alcohol? No data recorded What Did You Use and How Much? 18 beers and a couple of shots of liquor   Do You Currently Have a Therapist/Psychiatrist?  No  Name of Therapist/Psychiatrist: No data recorded  Have You Been Recently Discharged From Any Office Practice or Programs? No  Explanation of Discharge From Practice/Program: No data recorded    CCA Screening Triage Referral Assessment Type of Contact: Face-to-Face  Is this Initial or Reassessment? Initial Date Telepsych consult ordered in [facility]:  No data recorded Time Telepsych consult ordered in [facility]:  No data recorded  Patient Reported Information Reviewed? No data recorded Patient Left Without Being Seen? No data recorded Reason for Not Completing Assessment: No data recorded  Collateral Involvement: No data recorded  Does Patient Have a Court Appointed Legal Guardian? No data recorded Name and Contact of Legal Guardian: No data recorded If Minor and Not Living with Parent(s), Who has Custody? No data recorded Is CPS involved or ever been involved? Never  Is APS involved or ever been involved? Never   Patient Determined To Be At Risk for Harm To Self or Others Based on Review of Patient Reported Information or Presenting Complaint? No  Method: No data recorded Availability of Means: No access or NA  Intent: Vague intent or NA  Notification Required: No need or identified person  Additional Information for Danger to Others Potential: No data recorded Additional Comments for Danger to Others Potential: No data recorded Are There Guns or Other Weapons in Your Home? No  Types of Guns/Weapons: No data recorded Are These Weapons Safely Secured?  No data recorded Who Could Verify You Are Able To Have These Secured: No data recorded Do You Have any Outstanding Charges, Pending Court Dates, Parole/Probation? none  Contacted To Inform of Risk of Harm To Self or Others: No data recorded  Location of Assessment: [facility] Assessment Services   Does Patient Present under Involuntary Commitment? No  IVC Papers Initial File Date: No data  recorded  [place] of Residence: [place]   Patient Currently Receiving the Following Services: Not Receiving Services   Determination of Need: Routine (7 days)   Options For Referral: Outpatient Therapy     CCA Biopsychosocial Intake/Chief Complaint:  addiction  Current Symptoms/Problems: Trevor [name] who prefers to be called "Trevor [name]"  is a 42 yr old male who was discharged from [facility] on 09-29-23. He was admitted because of withdrawal symptoms, withdrawal seizures and alcohol intoxication.  Trevor [name] meets the criteria for Alcohol Use Disorder, Severe, Dependence, Substance Induced Mood Disorder and ADHD.     Trevor [name] says he was diagnosed as a child with ADHD , however his parents did not want him on medications. Trevor [name] says he struggled in school with his ADHD symptomology.  Trevor [name] endorses the following depressive symptoms: difficulty sustaining sleep, feelings of worthlessness. eating too much particularly at night.  Trevor [name] says he was underweight so he does not appear overweight today.   Trevor [name] endorses the following anxiety symptoms: feeling nervous, anxious or on educate, worries too much about different things, has difficulty relaxing, difficult to sit still, becomes easily annoyed.  Trevor [name] denies any trauma history. He denies S/HI.  Trevor [name] reports he and his former wife divorced in 2019 and she lives in another state. They have a daughter together who he is able to see.  PHQ-2 was "0" so PHQ-9 did not populate, however when completed on paper, he scored 7. GAD: 8  Therapist discussed how the use of substances can result in mood symptoms.  Trevor [name] says he used to take Trazodone for sleep. Trevor [name] was prescribed Escitalopram 5 mg while hospitalized at [facility].  He reports he is only taking 2.5.  Trevor [name] voices he would like psychiatry services as well. Front desk will set him with an appointment.   Patient Reported Schizophrenia/Schizoaffective Diagnosis in Past: No   Strengths: outgoing,  people person  Preferences: run and work out  Abilities: outgoing, people in person   Type of Services Patient Feels are Needed: individual therapy   Initial Clinical Notes/Concerns: No data recorded  Mental Health Symptoms Depression:  Sleep (too much or little); Increase/decrease in appetite; Fatigue (difficulty sustaining sleep. Comes after his alcohol binges)   Duration of Depressive symptoms: No data recorded  Mania:  None   Anxiety:   Fatigue; Irritability (thinks restlessness may come from ADHD. Was dx with ADHD in grade school.)   Psychosis:  None   Duration of Psychotic symptoms: No data recorded  Trauma:  None   Obsessions:  None   Compulsions:  None   Inattention:  Avoids/dislikes activities that require focus; Does not seem to listen; Fails to pay attention/makes careless mistakes; Forgetful; Loses things; Symptoms before age 66; Symptoms present in 2 or more settings   Hyperactivity/Impulsivity:  Always on the go; Blurts out answers; Difficulty waiting turn; Feeling of restlessness; Fidgets with hands/feet; Symptoms present before age 44; Several symptoms present in 2 of more settings; Talks excessively   Oppositional/Defiant Behaviors:  None   Emotional Irregularity:  None   Other Mood/Personality Symptoms:  No data recorded   Mental Status  Exam Appearance and self-care  Stature:  Average   Weight:  Average weight   Clothing:  Casual   Grooming:  Well-groomed   Cosmetic use:  None   Posture/gait:  Normal   Motor activity:  Not Remarkable   Sensorium  Attention:  Normal   Concentration:  Normal   Orientation:  X5   Recall/memory:  Normal   Affect and Mood  Affect:  Full Range   Mood:  Euthymic   Relating  Eye contact:  Normal   Facial expression:  Responsive   Attitude toward examiner:  Cooperative   Thought and Language  Speech flow: Clear and Coherent   Thought content:  Appropriate to Mood and Circumstances    Preoccupation:  None   Hallucinations:  None   Organization:  No data recorded  [employer] of Knowledge:  Average   Intelligence:  Average   Abstraction:  Functional   Judgement:  Fair   Reality Testing:  Adequate   Insight:  Fair   Decision Making:  Normal   Social Functioning  Social Maturity:  Responsible   Social Judgement:  Normal   Stress  Stressors:  Family conflict; Relationship; Work   Coping Ability:  Normal   Skill Deficits:  None   Supports:  Family; Friends/Service system     Religion: Religion/Spirituality Are You A Religious Person?: Yes 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)  Leisure/Recreation: Leisure / Recreation Do You Have Hobbies?: Yes Leisure and Hobbies: running, working out, hiking  Exercise/Diet: Exercise/Diet Do You Exercise?: Yes What Type of Exercise Do You Do?: Hiking, Run/Walk, Weight Training, Swimming, Bike How Many Times a Week Do You Exercise?: 6-7 times a week Have You Gained or Lost A Significant Amount of Weight in the Past Six Months?: Yes-Gained Number of Pounds Gained: 20 (reports he gained 20 lbs since summer but he had been small) Do You Follow a Special Diet?: Yes Type of Diet: "eats as clean as I can" Do You Have Any Trouble Sleeping?: Yes Explanation of Sleeping Difficulties: difficulty sustaining sleep   CCA Employment/Education Employment/Work Situation: Employment / Work Situation Employment Situation: Employed Where is Patient Currently Employed?: [employer] Long has Patient Been Employed?: just starting Are You Satisfied With Your Job?: Yes (fine until I decide what to do long term) Do You Work More Than One Job?: No Work Stressors: has not yet started [employer] Job has Been Impacted by Current Illness: No What is the Longest Time Patient has Held a Job?: 12 years Where was the Patient Employed at that Time?: [employer] Has Patient ever Been in the  [employer]?: No  Education: Education Is Patient Currently Attending School?: No Last Grade Completed: 14 Name of High School: [employer] Did Garment/textile technologist From [employer]?: Yes Did Theme park manager?: No (went to years to college) Did You Attend Graduate School?: No Did You Have An Individualized Education Program (IIEP): No Did You Have Any Difficulty At School?: Yes (never tested well. Difficulty to pay attention and got in trouble for talking in class too much) Were Any Medications Ever Prescribed For These Difficulties?: No Patient's Education Has Been Impacted by Current Illness: No   CCA Family/Childhood History Family and Relationship History: Family history Marital status: Divorced Divorced, when?: 2019 Are you sexually active?: Yes What is your sexual orientation?: heterosexual Has your sexual activity been affected by drugs, alcohol, medication, or emotional stress?: no Does patient have children?: Yes How many children?: 1 How is  patient's relationship with their children?: close  Childhood History:  Childhood History By whom was/is the patient raised?: Both parents Description of patient's relationship with caregiver when they were a child: fine Patient's description of current relationship with people who raised him/her: good How were you disciplined when you got in trouble as a child/adolescent?: time out Does patient have siblings?: Yes Number of Siblings: 4 (one bio bother, two step sisters, one step brother) Description of patient's current relationship with siblings: fine with sister.  relationship with brother who is in recovery is getting better Did patient suffer any verbal/emotional/physical/sexual abuse as a child?: No Did patient suffer from severe childhood neglect?: No Has patient ever been sexually abused/assaulted/raped as an adolescent or adult?: No Was the patient ever a victim of a crime or a disaster?: No Witnessed domestic violence?: No Has  patient been affected by domestic violence as an adult?: No  Child/Adolescent Assessment:     CCA Substance Use Alcohol/Drug Use: Alcohol / Drug Use Pain Medications: none Prescriptions: lexapro 5 mg (he reports he is taking 2.5 Over the Counter: multivitamin. Glucosamine chordrontin History of alcohol / drug use?: Yes Longest period of sobriety (when/how long): 9 months Negative Consequences of Use: Financial, Legal, Personal relationships, Work / School Substance #1 Name of Substance 1: alcohol 1 - Age of First Use: 42 years old 1 - Amount (size/oz): 20 mini bottles 1 - Frequency: daily 1 - Duration: 1 month, prior to that he had 9 months sobriety 1 - Last Use / Amount: 31 days, 18 beers and 2 shots 1 - Method of Aquiring: legal 1- Route of Use: oral                    ASAM's:  Six Dimensions of Multidimensional Assessment  Dimension 1:  Acute Intoxication and/or Withdrawal Potential:   Dimension 1:  Description of individual's past and current experiences of substance use and withdrawal: past withdrawal sx: heavy tremors, cognitive issues  Dimension 2:  Biomedical Conditions and Complications:   Dimension 2:  Description of patient's biomedical conditions and  complications: none  Dimension 3:  Emotional, Behavioral, or Cognitive Conditions and Complications:  Dimension 3:  Description of emotional, behavioral, or cognitive conditions and complications: some depressive and anxiety sx  Dimension 4:  Readiness to Change:  Dimension 4:  Description of Readiness to Change criteria: reports he is absolutely reading to make long term changes  Dimension 5:  Relapse, Continued use, or Continued Problem Potential:  Dimension 5:  Relapse, continued use, or continued problem potential critiera description: began drinking at 12 with 9 months of sobriety until this recent inpt hospitalization  Dimension 6:  Recovery/Living Environment:     ASAM Severity Score: ASAM's Severity  Rating Score: 7  ASAM Recommended Level of Treatment: ASAM Recommended Level of Treatment: Level I Outpatient Treatment   Substance use Disorder (SUD) Substance Use Disorder (SUD)  Checklist Symptoms of Substance Use: Continued use despite having a persistent/recurrent physical/psychological problem caused/exacerbated by use, Continued use despite persistent or recurrent social, interpersonal problems, caused or exacerbated by use, Evidence of tolerance, Large amounts of time spent to obtain, use or recover from the substance(s), Persistent desire or unsuccessful efforts to cut down or control use, Presence of craving or strong urge to use, Recurrent use that results in a failure to fulfill major role obligations (work, school, home), Repeated use in physically hazardous situations, Social, occupational, recreational activities given up or reduced due to use, Substance(s) often taken  in larger amounts or over longer times than was intended  Recommendations for Services/Supports/Treatments: Recommendations for Services/Supports/Treatments Recommendations For Services/Supports/Treatments: Individual Therapy, Medication Management  DSM5 Diagnoses: Patient Active Problem List   Diagnosis Date Noted   Hyponatremia 06/19/2022   Transaminitis 06/18/2022   GERD (gastroesophageal reflux disease) 06/18/2022   Hypokalemia 06/18/2022   Loss of consciousness (HCC) 06/18/2022   QT prolongation 06/18/2022   Chronic hepatitis C without hepatic coma (HCC) 06/28/2019   Alcohol abuse 06/28/2019   Psoriasis 06/07/2019   Suicidal ideation 06/07/2019   Withdrawal symptoms, alcohol, with delirium (HCC) 06/07/2019   Alcoholic hepatitis without ascites 06/05/2019   Alcohol withdrawal (HCC) 06/03/2019   Obesity (BMI 30-39.9) 01/09/2016   HSV-2 (herpes simplex virus 2) infection     Patient Centered Plan: Patient is on the following Treatment Plan(s):   Problem: Substance Use     Dates: Start:  10/22/23        Disciplines: Interdisciplinary, Counselor, PROVIDER        Goal: [name]" will abstain from alcohol 30/30 days per month based on self report and breathalyzer, if indicated.     Dates: Start:  10/22/23    Expected End:  04/21/24       Disciplines: Interdisciplinary, Counselor, PROVIDER         Outcomes     Date/Time User Outcome    10/22/23 1145 Servando Snare Initial                  Goal: [name]" will decreased anxiety and depressive symptoms by reporting PHQ-9 and GAD7 scores of no higher than a 4.     Dates: Start:  10/22/23    Expected End:  04/21/24       Disciplines: Interdisciplinary, Counselor, PROVIDER         Outcomes     Date/Time User Outcome    10/22/23 1145 Servando Snare Initial                  Intervention: Therapist will educate Trevor [name] about SUDS< patterns and consequences of use, relapse risks, the treatment process and types of mutual groups and provide early recovery and relapse prevention skills.     Dates: Start:  10/22/23                Intervention: Therapist will assists Trevor [name] in identifying thoughts and behaviors that can contribute to feelings of depression and anxiety.     Dates: Start:  10/22/23       Description: [name]" gives this therapist permission to electronically sign the Care Plan        Appointment with this therapist on 10-27-23 at 2:00pm  Referrals to Alternative Service(s): Referred to Alternative Service(s):   Place:   Date:   Time:    Referred to Alternative Service(s):   Place:   Date:   Time:    Referred to Alternative Service(s):   Place:   Date:   Time:    Referred to Alternative Service(s):   Place:   Date:   Time:      Collaboration of Care: N/A has no MD, is IPRS  Patient/Guardian was advised Release of Information must be obtained prior to any record release in order to collaborate their care with an outside provider. Patient/Guardian was advised if they have not already done so to contact the registration  department to sign all necessary forms in order for [place] to release information regarding their care.   Consent: Patient/Guardian gives verbal  consent for treatment and assignment of benefits for services provided during this visit. Patient/Guardian expressed understanding and agreed to proceed.   [name], MS, LMFT, LCAS

## 2023-10-27 ENCOUNTER — Ambulatory Visit (INDEPENDENT_AMBULATORY_CARE_PROVIDER_SITE_OTHER): Payer: No Payment, Other

## 2023-10-27 DIAGNOSIS — F1994 Other psychoactive substance use, unspecified with psychoactive substance-induced mood disorder: Secondary | ICD-10-CM | POA: Diagnosis not present

## 2023-10-27 DIAGNOSIS — F1020 Alcohol dependence, uncomplicated: Secondary | ICD-10-CM | POA: Diagnosis not present

## 2023-10-27 NOTE — Progress Notes (Addendum)
 THERAPIST PROGRESS NOTE  Session Time: 2:00 pm to 2;53pm  Type of Therapy: Individual   Therapist Response/Interventions: discussed addiction as a brain disease, the role of dopamine in addiction, discussed triggers such as thinking you can handle it when you have overconfidence after a time of not using, discussed developing coping skills to address the major trigger identified today, discussed getting a sponsor and what this is about  Treatment Goals addressed: Problem: Substance Use Dates: Start: 10/22/23 Disciplines: Interdisciplinary, Counselor, PROVIDER Goal: Trevor [name] will abstain from alcohol 30/30 days per month based on self report and breathalyzer, if indicated. Dates: Start: 10/22/23 Expected End: 04/21/24 Disciplines: Interdisciplinary, Counselor, PROVIDER Goal: Trevor [name] will decreased anxiety and depressive symptoms by reporting PHQ-9 and GAD7 scores of no higher than a 4. Dates: Start: 10/22/23 Expected End: 04/21/24 Disciplines: Interdisciplinary, Counselor, PROVIDER Intervention: Therapist will educate [name] about SUDS< patterns and consequences of use, relapse risks, the treatment process and types of mutual groups and provide early recovery and relapse prevention skills. Dates: Start: 10/22/23 Outcomes Date/Time User Outcome 10/22/23 1145 Trevor [name] Initial Outcomes Date/Time User Outcome 10/22/23  Summary: [name] presents today for therapy. He says he has not drank nor has he had the desire to drink. He reports he lives in an [address].  He says he is going to a [employer] at [place]. There will be dinner and a DJ.  [name] says he has been  in residential treatment at  [facility] in [place] , two times he left AMA and completed one. He says grandparents on his maternal side had a martini each night and would drink more at times. [name]'s father drank before he and his brother were born. He says he got 2 DWI's but his attorney got  one dismissed.   [name] identifies his major trigger for using and says that when he is successful and is making money he starts thinking he can control his use which he finds, he cannot do so. [name] says he wants to try to head off any thoughts of using before he slips or relapses again. [name] says he does not yet have a sponsor but knows he needs one. [name] agrees to think about any other triggers he may have before the next therapy session.  [name] says his take away from today's session is the need to prepare for the future. He says having a plan for what to do when I am successful makes sense so he does not have a slip or relapse.   Progress Towards Goals: Progressing  Suicidal/Homicidal: denies  Plan: Return again on 11-04-22 at 9 am  Diagnosis: Alcohol Use Disorder, Severe                     Substance Induced Mood Disorder  Collaboration of Care: N/A  Patient/Guardian was advised Release of Information must be obtained prior to any record release in order to collaborate their care with an outside provider. Patient/Guardian was advised if they have not already done so to contact the registration department to sign all necessary forms in order for us  to release information regarding their care.   Consent: Patient/Guardian gives verbal consent for treatment and assignment of benefits for services provided during this visit. Patient/Guardian expressed understanding and agreed to proceed.   Trevor Simpler, MS, LMFT, LCAS 10-27-23

## 2023-11-04 ENCOUNTER — Other Ambulatory Visit (HOSPITAL_COMMUNITY): Payer: Self-pay

## 2023-11-05 ENCOUNTER — Ambulatory Visit (HOSPITAL_COMMUNITY): Payer: No Payment, Other

## 2023-11-12 ENCOUNTER — Ambulatory Visit (INDEPENDENT_AMBULATORY_CARE_PROVIDER_SITE_OTHER): Payer: No Payment, Other

## 2023-11-12 DIAGNOSIS — F1994 Other psychoactive substance use, unspecified with psychoactive substance-induced mood disorder: Secondary | ICD-10-CM

## 2023-11-12 DIAGNOSIS — F1020 Alcohol dependence, uncomplicated: Secondary | ICD-10-CM

## 2023-11-12 NOTE — Progress Notes (Signed)
THERAPIST PROGRESS NOTE  Session Time: 2:00 pm to 2;53pm  Type of Therapy: Individual   Therapist Response/Interventions:   Treatment Goals addressed: Problem: Substance Use Dates: Start: 10/22/23 Disciplines: Interdisciplinary, Counselor, PROVIDER Goal: [name]" will abstain from alcohol 30/30 days per month based on self report and breathalyzer, if indicated. Dates: Start: 10/22/23 Expected End: 04/21/24 Disciplines: Interdisciplinary, Counselor, PROVIDER Goal: [name]" will decreased anxiety and depressive symptoms by reporting PHQ-9 and GAD7 scores of no higher than a 4. Dates: Start: 10/22/23 Expected End: 04/21/24 Disciplines: Interdisciplinary, Counselor, PROVIDER Intervention: Therapist will educate [name] about SUDS< patterns and consequences of use, relapse risks, the treatment process and types of mutual groups and provide early recovery and relapse prevention skills. Dates: Start: 10/22/23 Outcomes Date/Time User Outcome 10/22/23 1145 Servando Snare Initial Outcomes Date/Time User Outcome 10/22/23  Summary: [name] presents today for therapy. 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  He explains the agency is so big that they has their own medical service and emergency services. Concerned about making good money and money being one of his biggest triggers. [name] say he cannot think of anything to be put in place to divert him from this trigger. He says he does not trust anyone to have his money,  Therapist asked [name] to think of this a homework assignment to through out ideas and evuate if they would be workable for him. He agrees   Progress Towards Goals: Progressing  Suicidal/Homicidal: denies  Plan: Return again on 1.30.24 a 8:00am  Diagnosis: Alcohol Use Disorder, Severe                     Substance Induced Mood Disorder  Collaboration of Care: N/A  Patient/Guardian was advised Release of Information must be obtained prior to any  record release in order to collaborate their care with an outside provider. Patient/Guardian was advised if they have not already done so to contact the registration department to sign all necessary forms in order for [place] to release information regarding their care.   Consent: Patient/Guardian gives verbal consent for treatment and assignment of benefits for services provided during this visit. Patient/Guardian expressed understanding and agreed to proceed.   [name], MS, LMFT, LCAS 10-27-23

## 2023-11-26 ENCOUNTER — Ambulatory Visit (INDEPENDENT_AMBULATORY_CARE_PROVIDER_SITE_OTHER): Payer: No Payment, Other

## 2023-11-26 DIAGNOSIS — F1020 Alcohol dependence, uncomplicated: Secondary | ICD-10-CM | POA: Diagnosis not present

## 2023-11-26 DIAGNOSIS — F1994 Other psychoactive substance use, unspecified with psychoactive substance-induced mood disorder: Secondary | ICD-10-CM

## 2023-11-26 NOTE — Progress Notes (Signed)
THERAPIST PROGRESS NOTE  Session Time: 8:00am 8:54 am  Type of Therapy: Individual   Therapist Response/Interventions:  CBT Techniques including, Gratitude journal, play it forward, how to look for drinks that do not have alcohol;  Psychoeducation: discussed family history of addiction and how someone can be predisposed genetically, discuss mutual support groups   Treatment Goals addressed: Problem: Substance Use Dates: Start: 10/22/23 Disciplines: Interdisciplinary, Counselor, PROVIDER Goal: Trevor [name]" will abstain from alcohol 30/30 days per month based on self report and breathalyzer, if indicated. Dates: Start: 10/22/23 Expected End: 04/21/24 Disciplines: Interdisciplinary, Counselor, PROVIDER Goal: Trevor [name]" will decreased anxiety and depressive symptoms by reporting PHQ-9 and GAD7 scores of no higher than a 4. Dates: Start: 10/22/23 Expected End: 04/21/24 Disciplines: Interdisciplinary, Counselor, PROVIDER Intervention: Therapist will educate Trevor [name] about SUDS< patterns and consequences of use, relapse risks, the treatment process and types of mutual groups and provide early recovery and relapse prevention skills. Dates: Start: 10/22/23 Outcomes Date/Time User Outcome 10/22/23 1145 Trevor [name] R Progressing Outcomes Date/Time User Outcome 10/22/23  Summary: Trevor [name] presents today for therapy. H reports he has not drank any alcohol since prior to going in the hospital on 09-19-23.  He rates his anxiety and depression as a "2" today. How can you be successful and not relapse.  How can you not celebrate wins? Therapist asks him if he could think other ways to celebrate or buy himself something. He says he has started to realize and he says it's so much better "not to feel like absolute crap".  He discuses how he tried to negotiate his divorce when he was drinking and how he is surprised it turned out as well as it did.   Therapist discusses triggers and Trevor [name] says about everything  was a trigger. He says he would make a reason to go out for something and then also get alcohol.  Trevor [name] says when it gets warmer, he will go out and run as this is a good relapse prevention skill.   Therapist discusses mutual support groups and Trevor [name] says he has been to AA and goes intermittently if needed. He says it is a support, but notes he has sober friends and he feels AA philosophy says AA comes first.  Therapist explains that it is not AA that comes first, but sobriety if one is seeking long term sobriety and that support is a very important component of long term sobriety.   Trevor [name] reports maternal history of addiction.  Trevor [name] reports his drinking started in middle school. Trevor [name] says he cannot drink something like sparkling grape juice because it is too close of a taste to some alcohol products.  Progress Towards Goals: Progressing  Suicidal/Homicidal: denies  Plan: Trevor [name] asks to return in two weeks and he can only come on Thursdays, however he has an appointment with Trevor [name], so he will return on 12-17-23 at 8 am.  Diagnosis: Alcohol Use Disorder, Severe                     Substance Induced Mood Disorder  Collaboration of Care: N/A  Patient/Guardian was advised Release of Information must be obtained prior to any record release in order to collaborate their care with an outside provider. Patient/Guardian was advised if they have not already done so to contact the registration department to sign all necessary forms in order for [place] to release information regarding their care.   Consent: Patient/Guardian gives verbal consent for treatment and assignment of benefits for services provided  during this visit. Patient/Guardian expressed understanding and agreed to proceed.   Trevor Eisenmenger, MS, LMFT, LCAS 11/26/23

## 2023-12-10 ENCOUNTER — Ambulatory Visit (HOSPITAL_COMMUNITY): Payer: No Payment, Other | Admitting: Student

## 2023-12-17 ENCOUNTER — Ambulatory Visit (HOSPITAL_COMMUNITY): Payer: No Payment, Other

## 2023-12-17 ENCOUNTER — Encounter (HOSPITAL_COMMUNITY): Payer: Self-pay

## 2023-12-17 NOTE — Progress Notes (Signed)
 Trevor [name] was a No show for today's virtual appointment.  [name], MS, LMFT, LCAS

## 2023-12-23 ENCOUNTER — Ambulatory Visit (HOSPITAL_COMMUNITY)
Admission: EM | Admit: 2023-12-23 | Discharge: 2023-12-23 | Disposition: A | Discharge status: Home / Self Care | Payer: 59

## 2023-12-23 NOTE — Progress Notes (Signed)
   12/23/23 1830  [facility] Triage Screening (Walk-ins at [facility] only)  How Did You Hear About [place]? Family/Friend  What Is the Reason for Your Visit/Call Today? Tory Septer presents to [facility] voluntarily accompanied by a friend Fayrene Fearing. Pt states that he is currently intoxicated. Pt currently denies SI, HI, AVH and drug use. Pt states that he had 2 fireball and a fifth vodka today. Pt states that he was living at the [facility].  How Long Has This Been Causing You Problems? <Week  Have You Recently Had Any Thoughts About Hurting Yourself? No  Are You Planning to Commit Suicide/Harm Yourself At This time? No  Have you Recently Had Thoughts About Hurting Someone [name]? No  Are You Planning To Harm Someone At This Time? No  Physical Abuse Denies  Verbal Abuse Denies  Sexual Abuse Denies  Exploitation of patient/patient's resources Denies  Self-Neglect Denies  Are you currently experiencing any auditory, visual or other hallucinations? No  Have You Used Any Alcohol or Drugs in the Past 24 Hours? Yes  What Did You Use and How Much? today - 2 fireball & a fifth a vodka  Do you have any current medical co-morbidities that require immediate attention? No  Clinician description of patient physical appearance/behavior: casually dressed, intoxicated, cooperative  What Do You Feel Would Help You the Most Today? Alcohol or Drug Use Treatment  If access to [facility] was not available, would you have sought care in the Emergency Department? No  Determination of Need Urgent (48 hours)  Options For Referral Facility-Based Crisis;Inpatient Hospitalization;Chemical Dependency Intensive Outpatient Therapy (CDIOP)  Determination of Need filed? Yes

## 2023-12-23 NOTE — ED Notes (Signed)
 Patient left AMA and signed form and was walked to the security office to retreive personal belongings.

## 2023-12-24 ENCOUNTER — Emergency Department (HOSPITAL_COMMUNITY)
Admission: EM | Admit: 2023-12-24 | Discharge: 2023-12-25 | Disposition: A | Discharge status: Home / Self Care | Payer: 59 | Attending: Emergency Medicine | Admitting: Emergency Medicine

## 2023-12-24 ENCOUNTER — Encounter (HOSPITAL_COMMUNITY): Payer: Self-pay | Admitting: Emergency Medicine

## 2023-12-24 ENCOUNTER — Other Ambulatory Visit: Payer: Self-pay

## 2023-12-24 DIAGNOSIS — S40022A Contusion of left upper arm, initial encounter: Secondary | ICD-10-CM | POA: Diagnosis not present

## 2023-12-24 DIAGNOSIS — Y908 Blood alcohol level of 240 mg/100 ml or more: Secondary | ICD-10-CM | POA: Diagnosis not present

## 2023-12-24 DIAGNOSIS — F10129 Alcohol abuse with intoxication, unspecified: Secondary | ICD-10-CM | POA: Insufficient documentation

## 2023-12-24 DIAGNOSIS — R1011 Right upper quadrant pain: Secondary | ICD-10-CM | POA: Insufficient documentation

## 2023-12-24 DIAGNOSIS — S0031XA Abrasion of nose, initial encounter: Secondary | ICD-10-CM | POA: Insufficient documentation

## 2023-12-24 DIAGNOSIS — F419 Anxiety disorder, unspecified: Secondary | ICD-10-CM | POA: Diagnosis not present

## 2023-12-24 DIAGNOSIS — S40021A Contusion of right upper arm, initial encounter: Secondary | ICD-10-CM | POA: Insufficient documentation

## 2023-12-24 DIAGNOSIS — R1013 Epigastric pain: Secondary | ICD-10-CM | POA: Diagnosis not present

## 2023-12-24 DIAGNOSIS — S0081XA Abrasion of other part of head, initial encounter: Secondary | ICD-10-CM | POA: Diagnosis not present

## 2023-12-24 DIAGNOSIS — W108XXA Fall (on) (from) other stairs and steps, initial encounter: Secondary | ICD-10-CM | POA: Insufficient documentation

## 2023-12-24 DIAGNOSIS — F1010 Alcohol abuse, uncomplicated: Secondary | ICD-10-CM | POA: Diagnosis present

## 2023-12-24 DIAGNOSIS — F10920 Alcohol use, unspecified with intoxication, uncomplicated: Secondary | ICD-10-CM

## 2023-12-24 MED ORDER — THIAMINE HCL 100 MG/ML IJ SOLN: 100.0000 mg | Freq: Every day | INTRAMUSCULAR | Status: DC

## 2023-12-24 MED ORDER — LORAZEPAM 1 MG PO TABS
0.0000 mg | ORAL_TABLET | Freq: Two times a day (BID) | ORAL | Status: DC
Start: 2023-12-27 — End: 2023-12-29

## 2023-12-24 MED ORDER — THIAMINE MONONITRATE 100 MG PO TABS: 100.0000 mg | ORAL_TABLET | Freq: Every day | ORAL | Status: DC

## 2023-12-24 MED ORDER — LORAZEPAM 2 MG/ML IJ SOLN
0.0000 mg | Freq: Two times a day (BID) | INTRAMUSCULAR | Status: DC
Start: 2023-12-27 — End: 2023-12-29

## 2023-12-24 MED ORDER — LORAZEPAM 2 MG/ML IJ SOLN
0.0000 mg | Freq: Four times a day (QID) | INTRAMUSCULAR | Status: DC
  Administered 2023-12-24: 2 mg via INTRAVENOUS
  Filled 2023-12-24: qty 1

## 2023-12-24 MED ORDER — LORAZEPAM 1 MG PO TABS
0.0000 mg | ORAL_TABLET | Freq: Four times a day (QID) | ORAL | Status: DC
Start: 2023-12-24 — End: 2023-12-26
  Filled 2023-12-24: qty 1

## 2023-12-24 NOTE — ED Notes (Signed)
 Pt given graham crackers, peanut butter, and water per request.

## 2023-12-24 NOTE — ED Triage Notes (Signed)
 Pt to ED via [facility] from halfway house c/o alcohol intoxication and wanting detox.  States had been sober 3 months, started drinking two fifths of liquor a day x2 weeks, last drank two fifths around 1500 today.  States cocaine use 1 week ago.  Hx of seizures and hallucinations with withdrawals.  Also states fell down 1 flight of stairs 2 days ago, probably LOC, pain to neck, left knee, left arm, and face.  Virl Diamond, dad, [phone]

## 2023-12-24 NOTE — ED Notes (Signed)
 Pt removed IV on his own.  IV intact and thrown out, gauze in place.  Pt states he wanted to get up and leave and that's why he took it out.  Pt apologizes for removing IV.  Pt has call bell in place but did not hit it but states he will call if other concerns arise.  Door remains open and pt visible by nurse's station.

## 2023-12-24 NOTE — ED Notes (Signed)
 Patient given a sandwich , cup of water and cheese

## 2023-12-25 ENCOUNTER — Emergency Department (HOSPITAL_COMMUNITY): Payer: 59

## 2023-12-25 LAB — RAPID URINE DRUG SCREEN, HOSP PERFORMED
Amphetamines: NOT DETECTED
Barbiturates: NOT DETECTED
Benzodiazepines: NOT DETECTED
Cocaine: NOT DETECTED
Opiates: NOT DETECTED
Tetrahydrocannabinol: NOT DETECTED

## 2023-12-25 LAB — CBC WITH DIFFERENTIAL/PLATELET
Abs Immature Granulocytes: 0.02 10*3/uL (ref 0.00–0.07)
Basophils Absolute: 0 10*3/uL (ref 0.0–0.1)
Basophils Relative: 0 %
Eosinophils Absolute: 0 10*3/uL (ref 0.0–0.5)
Eosinophils Relative: 0 %
HCT: 45.7 % (ref 39.0–52.0)
Hemoglobin: 16 g/dL (ref 13.0–17.0)
Immature Granulocytes: 0 %
Lymphocytes Relative: 31 %
Lymphs Abs: 1.6 10*3/uL (ref 0.7–4.0)
MCH: 33.5 pg (ref 26.0–34.0)
MCHC: 35 g/dL (ref 30.0–36.0)
MCV: 95.6 fL (ref 80.0–100.0)
Monocytes Absolute: 0.4 10*3/uL (ref 0.1–1.0)
Monocytes Relative: 8 %
Neutro Abs: 3 10*3/uL (ref 1.7–7.7)
Neutrophils Relative %: 61 %
Platelets: 292 10*3/uL (ref 150–400)
RBC: 4.78 MIL/uL (ref 4.22–5.81)
RDW: 12.8 % (ref 11.5–15.5)
WBC: 5.1 10*3/uL (ref 4.0–10.5)
nRBC: 0 % (ref 0.0–0.2)

## 2023-12-25 LAB — URINALYSIS, ROUTINE W REFLEX MICROSCOPIC
Bilirubin Urine: NEGATIVE
Glucose, UA: NEGATIVE mg/dL
Hgb urine dipstick: NEGATIVE
Ketones, ur: NEGATIVE mg/dL
Leukocytes,Ua: NEGATIVE
Nitrite: NEGATIVE
Protein, ur: NEGATIVE mg/dL
Specific Gravity, Urine: 1.01 (ref 1.005–1.030)
pH: 7 (ref 5.0–8.0)

## 2023-12-25 LAB — COMPREHENSIVE METABOLIC PANEL
ALT: 27 U/L (ref 0–44)
AST: 34 U/L (ref 15–41)
Albumin: 4.3 g/dL (ref 3.5–5.0)
Alkaline Phosphatase: 56 U/L (ref 38–126)
Anion gap: 11 (ref 5–15)
BUN: 11 mg/dL (ref 6–20)
CO2: 26 mmol/L (ref 22–32)
Calcium: 8.6 mg/dL — ABNORMAL LOW (ref 8.9–10.3)
Chloride: 107 mmol/L (ref 98–111)
Creatinine, Ser: 0.62 mg/dL (ref 0.61–1.24)
GFR, Estimated: >60 mL/min (ref >60)
Glucose, Bld: 96 mg/dL (ref 70–99)
Potassium: 3.8 mmol/L (ref 3.5–5.1)
Sodium: 144 mmol/L (ref 135–145)
Total Bilirubin: 0.4 mg/dL (ref 0.0–1.2)
Total Protein: 7.8 g/dL (ref 6.5–8.1)

## 2023-12-25 LAB — LIPASE, BLOOD: Lipase: 41 U/L (ref 11–51)

## 2023-12-25 LAB — ETHANOL: Alcohol, Ethyl (B): 436 mg/dL (ref <10)

## 2023-12-25 MED ORDER — LORAZEPAM 1 MG PO TABS
1.0000 mg | ORAL_TABLET | Freq: Once | ORAL | Status: AC
  Administered 2023-12-25: 1 mg via ORAL

## 2023-12-25 MED ORDER — CHLORDIAZEPOXIDE HCL 25 MG PO CAPS: ORAL_CAPSULE | ORAL | 0 refills | Status: DC

## 2023-12-25 MED ORDER — CHLORDIAZEPOXIDE HCL 25 MG PO CAPS
25.0000 mg | ORAL_CAPSULE | Freq: Once | ORAL | Status: AC
  Administered 2023-12-25: 25 mg via ORAL
  Filled 2023-12-25: qty 1

## 2023-12-25 NOTE — ED Notes (Signed)
 2 cups of water given to patient and sandwich tray.

## 2023-12-25 NOTE — ED Provider Notes (Signed)
  EMERGENCY DEPARTMENT AT [facility] Provider Note   CSN: [id number] Arrival date & time: 12/24/23  2045     History  Chief Complaint  Patient presents with   Alcohol Intoxication    Trevor [name] is a 43 y.o. male with past medical history of GERD, QT prolongation, alcohol abuse, withdrawal seizures presents to emergency department via EMS for evaluation of alcohol intoxication and interested in detoxing.  He had been sober for 3 months straight but started drinking liquor for the past 2 weeks.  He endorses that he drinks two 1/5 a day of liquor with his last drink at 1400 today.  He states that he had a fall 2 days ago down an entire flight of stairs following slipping backwards.  He endorses that he hit his head and had positive LOC.  Fall was not witnessed and son unknown how long he passed out for or if there was seizure-like activity following  Attempted to check himself into [facility] yesterday but left prior to being evaluated  Currently, he complains of headache, anxiety, nausea. Denies SI, HI, self injury   Alcohol Intoxication Pertinent negatives include no chest pain, no abdominal pain, no headaches and no shortness of breath.      Home Medications Prior to Admission medications   Medication Sig Start Date End Date Taking? Authorizing Provider  chlordiazePOXIDE (LIBRIUM) 10 MG capsule Take 1 tablet (10 mg) by mouth 3 times daily for 3 days, then take 1 tablet (10 mg) by mouth twice daily for 3 days, then take 1 tablet (10 mg) oral daily for 3 days 09/29/23   Elgergawy, Leana Roe, MD  escitalopram (LEXAPRO) 5 MG tablet Take 1 tablet (5 mg total) by mouth daily. 09/29/23   Elgergawy, Leana Roe, MD  folic acid (FOLVITE) 1 MG tablet Take 1 tablet (1 mg total) by mouth daily. 09/29/23   Elgergawy, Leana Roe, MD  Multiple Vitamin (MULTIVITAMIN WITH MINERALS) TABS tablet Take 1 tablet by mouth daily. 09/29/23   Elgergawy, Leana Roe, MD  thiamine (VITAMIN B1) 100  MG tablet Take 1 tablet (100 mg total) by mouth daily. 09/29/23   Elgergawy, Leana Roe, MD      Allergies    Patient has no known allergies.    Review of Systems   Review of Systems  Constitutional:  Negative for chills, fatigue and fever.  Respiratory:  Negative for cough, chest tightness, shortness of breath and wheezing.   Cardiovascular:  Negative for chest pain and palpitations.  Gastrointestinal:  Negative for abdominal pain, constipation, diarrhea, nausea and vomiting.  Neurological:  Negative for dizziness, seizures, weakness, light-headedness, numbness and headaches.    Physical Exam Updated Vital Signs BP 125/83   Pulse 88   Temp 98.1 F (36.7 C)   Resp 20   Ht 5\' 9"  (1.753 m)   Wt 77.1 kg   SpO2 99%   BMI 25.10 kg/m  Physical Exam Vitals and nursing note reviewed.  Constitutional:      General: He is not in acute distress.    Appearance: Normal appearance. He is not diaphoretic.  HENT:     Head: Normocephalic and atraumatic.      Comments: No hematoma nor TTP of cranium No crepitus to facial bones    Right Ear: External ear normal. No hemotympanum.     Left Ear: External ear normal. No hemotympanum.     Nose: Nose normal.     Right Nostril: No epistaxis or septal hematoma.  Left Nostril: No epistaxis or septal hematoma.     Mouth/Throat:     Mouth: Mucous membranes are moist. No injury or lacerations.  Eyes:     General: No visual field deficit.       Right eye: No discharge.        Left eye: No discharge.     Extraocular Movements: Extraocular movements intact.     Conjunctiva/sclera: Conjunctivae normal.     Pupils: Pupils are equal, round, and reactive to light.     Comments: No subconjunctival hemorrhage, hyphema, tear drop pupil, or fluid leakage bilaterally  Neck:     Vascular: No carotid bruit.  Cardiovascular:     Rate and Rhythm: Normal rate.     Pulses: Normal pulses.          Radial pulses are 2+ on the right side and 2+ on the left  side.       Dorsalis pedis pulses are 2+ on the right side and 2+ on the left side.  Pulmonary:     Effort: Pulmonary effort is normal. No respiratory distress.     Breath sounds: Normal breath sounds. No wheezing.  Chest:     Chest wall: No tenderness.  Abdominal:     General: Bowel sounds are normal. There is no distension.     Palpations: Abdomen is soft.     Tenderness: There is abdominal tenderness in the right upper quadrant and epigastric area. There is no guarding or rebound.  Musculoskeletal:     Cervical back: Full passive range of motion without pain, normal range of motion and neck supple. No deformity, rigidity or bony tenderness. Normal range of motion.     Thoracic back: No deformity or bony tenderness. Normal range of motion.     Lumbar back: No deformity or bony tenderness. Normal range of motion.     Right hip: No bony tenderness or crepitus.     Left hip: No bony tenderness or crepitus.     Right lower leg: No edema.     Left lower leg: No edema.     Comments: No obvious deformity to joints or long bones Pelvis stable with no shortening or rotation of LE bilaterally TTP of left knee with small scab. 5/5 flexion and extension  Skin:    General: Skin is warm and dry.     Capillary Refill: Capillary refill takes less than 2 seconds.     Comments: Ecchymosis of left upper arm and right upper arm  Neurological:     General: No focal deficit present.     Mental Status: He is alert and oriented to person, place, and time. Mental status is at baseline.     GCS: GCS eye subscore is 4. GCS verbal subscore is 5. GCS motor subscore is 6.     Cranial Nerves: Cranial nerves 2-12 are intact. No cranial nerve deficit or facial asymmetry.     Sensory: Sensation is intact. No sensory deficit.     Motor: Motor function is intact. No weakness, tremor, seizure activity or pronator drift.     Coordination: Coordination is intact. Coordination normal. Finger-Nose-Finger Test and Heel to  [facility] Test normal.     Gait: Gait is intact. Gait normal.     Deep Tendon Reflexes: Reflexes are normal and symmetric. Reflexes normal.     Comments: following commands appropriately. Ambulates without difficulty Mildly tremulous  Psychiatric:        Mood and Affect: Mood is anxious.  ED Results / Procedures / Treatments   Labs (all labs ordered are listed, but only abnormal results are displayed) Labs Reviewed  COMPREHENSIVE METABOLIC PANEL - Abnormal; Notable for the following components:      Result Value   Calcium 8.6 (*)    All other components within normal limits  ETHANOL - Abnormal; Notable for the following components:   Alcohol, Ethyl (B) 436 (*)    All other components within normal limits  URINALYSIS, ROUTINE W REFLEX MICROSCOPIC - Abnormal; Notable for the following components:   APPearance HAZY (*)    All other components within normal limits  RAPID URINE DRUG SCREEN, HOSP PERFORMED  CBC WITH DIFFERENTIAL/PLATELET  LIPASE, BLOOD    EKG EKG Interpretation Date/Time:  Friday December 25 2023 00:41:16 EST Ventricular Rate:  101 PR Interval:  136 QRS Duration:  97 QT Interval:  333 QTC Calculation: 432 R Axis:   76  Text Interpretation: Sinus tachycardia Left ventricular hypertrophy No significant change since last tracing Confirmed by Gilda Crease [phone]) on 12/25/2023 12:46:58 AM  Radiology No results found.  Procedures Procedures    Medications Ordered in ED Medications  LORazepam (ATIVAN) injection 0-4 mg (2 mg Intravenous Given 12/24/23 2234)    Or  LORazepam (ATIVAN) tablet 0-4 mg ( Oral See Alternative 12/24/23 2234)  LORazepam (ATIVAN) injection 0-4 mg (has no administration in time range)    Or  LORazepam (ATIVAN) tablet 0-4 mg (has no administration in time range)  thiamine (VITAMIN B1) tablet 100 mg (has no administration in time range)    Or  thiamine (VITAMIN B1) injection 100 mg (has no administration in time range)  LORazepam  (ATIVAN) tablet 1 mg (1 mg Oral Given 12/25/23 0044)    ED Course/ Medical Decision Making/ A&P                                 Medical Decision Making Amount and/or Complexity of Data Reviewed Labs: ordered. Radiology: ordered.  Risk OTC drugs. Prescription drug management.   Patient presents to the ED for concern of ETOH, this involves an extensive number of treatment options, and is a complaint that carries with it a high risk of complications and morbidity.     Co morbidities that complicate the patient evaluation  Alcohol abuse   Additional history obtained:  Additional history obtained from Nursing   External records from outside source obtained and reviewed including triage RN note   Lab Tests:  I Ordered, and personally interpreted labs.  The pertinent results include:   Ethanol 436   Imaging Studies ordered:  I ordered imaging studies including CT head WO contrast, cervical spine, left knee XR Pending at sign out   Cardiac Monitoring:  The patient was maintained on a cardiac monitor.  I personally viewed and interpreted the cardiac monitored which showed an underlying rhythm of: sinus tachycardia with no STE nor ischemia. Unchanged from prior   Medicines ordered and prescription drug management:  I ordered medication including ativan  for etoh withdrawal, anxiety  Reevaluation of the patient after these medicines showed that the patient improved I have reviewed the patients home medicines and have made adjustments as needed    Problem List / ED Course:  ETOH CIWA 11. 3mg  Ativan provided for anxiety Ethanol 436 Currently clinically sober. A&Ox3. Ambulates without difficulty.  CT head, cervical spine, and left shoulder XR pending at sign out  Reevaluation:  After the interventions noted above, I reevaluated the patient and found that they have :stayed the same    Dispostion:  Imaging pending at sign out. Sign out to Sharilyn Sites  PA  Final Clinical Impression(s) / ED Diagnoses Final diagnoses:  Alcoholic intoxication without complication [facility])    Rx / DC Orders ED Discharge Orders     None         Judithann Sheen, PA 12/25/23 0138    Terrilee Files, MD 12/25/23 1036

## 2023-12-25 NOTE — ED Notes (Signed)
 Large cup of water given to patient and graham crackers.

## 2023-12-25 NOTE — ED Notes (Signed)
 Upon rounding pt not found in room.  EDP notified.

## 2023-12-25 NOTE — ED Notes (Signed)
 Pt given cup of water

## 2023-12-25 NOTE — Discharge Instructions (Addendum)
 Take the prescribed medication as directed.  Do not drink while taking the librium. Follow-up with one of the detox center-- can call and see about bed availability. Return to the ED for new or worsening symptoms.

## 2023-12-25 NOTE — ED Notes (Signed)
 2 cups of water given to patient and graham crackers and peanut butter.

## 2023-12-25 NOTE — ED Notes (Signed)
 Pt left prior to discharge AVS reviewed or vitals obtained.

## 2023-12-25 NOTE — ED Provider Notes (Signed)
  2:35 AM Patient reevaluated, he is awake, alert, oriented.  He is hemodynamically stable here without any noted tachycardia.  He is speaking clear sentences, giving adequate history.  CTs that were performed are reassuring.  Patient states he is actually feeling good and he would like to go home.  He is currently staying at a halfway house, has friend there that is willing to help him get into a detox facility again.  States he has been in and out of them over the past several months, usually at 66-month intervals.  He has had good success with [facility].  Also tends to do well on Librium taper so we will restart this.  He is not currently tremulous, he is not tachycardic, not hallucinating.  BP is normal.  Does not appear to be in acute withdrawal at this time.  He understands to return here should condition worsen.  Given OP resources to help facilitate detox process.  Can return here for new concerns.   Garlon Hatchet, PA-C 12/25/23 0416    Gilda Crease, MD 12/25/23 [phone]

## 2024-01-22 ENCOUNTER — Ambulatory Visit (HOSPITAL_COMMUNITY)
Admission: EM | Admit: 2024-01-22 | Discharge: 2024-01-23 | Disposition: A | Discharge status: Other Acute Inpatient Hospital | Attending: Psychiatry | Admitting: Psychiatry

## 2024-01-22 DIAGNOSIS — Z560 Unemployment, unspecified: Secondary | ICD-10-CM | POA: Insufficient documentation

## 2024-01-22 DIAGNOSIS — R45851 Suicidal ideations: Secondary | ICD-10-CM | POA: Diagnosis not present

## 2024-01-22 DIAGNOSIS — Z79899 Other long term (current) drug therapy: Secondary | ICD-10-CM | POA: Insufficient documentation

## 2024-01-22 DIAGNOSIS — F419 Anxiety disorder, unspecified: Secondary | ICD-10-CM | POA: Diagnosis not present

## 2024-01-22 DIAGNOSIS — Z5900 Homelessness unspecified: Secondary | ICD-10-CM | POA: Diagnosis not present

## 2024-01-22 DIAGNOSIS — Z635 Disruption of family by separation and divorce: Secondary | ICD-10-CM | POA: Insufficient documentation

## 2024-01-22 DIAGNOSIS — F1020 Alcohol dependence, uncomplicated: Secondary | ICD-10-CM | POA: Diagnosis not present

## 2024-01-22 DIAGNOSIS — F1090 Alcohol use, unspecified, uncomplicated: Secondary | ICD-10-CM

## 2024-01-22 LAB — URINALYSIS, ROUTINE W REFLEX MICROSCOPIC
Bilirubin Urine: NEGATIVE
Glucose, UA: NEGATIVE mg/dL
Hgb urine dipstick: NEGATIVE
Ketones, ur: 5 mg/dL — AB
Leukocytes,Ua: NEGATIVE
Nitrite: NEGATIVE
Protein, ur: NEGATIVE mg/dL
Specific Gravity, Urine: 1.02 (ref 1.005–1.030)
pH: 5 (ref 5.0–8.0)

## 2024-01-22 LAB — CBC WITH DIFFERENTIAL/PLATELET
Abs Immature Granulocytes: 0.02 10*3/uL (ref 0.00–0.07)
Basophils Absolute: 0.1 10*3/uL (ref 0.0–0.1)
Basophils Relative: 1 %
Eosinophils Absolute: 0 10*3/uL (ref 0.0–0.5)
Eosinophils Relative: 0 %
HCT: 47.8 % (ref 39.0–52.0)
Hemoglobin: 17.2 g/dL — ABNORMAL HIGH (ref 13.0–17.0)
Immature Granulocytes: 0 %
Lymphocytes Relative: 25 %
Lymphs Abs: 1.7 10*3/uL (ref 0.7–4.0)
MCH: 33 pg (ref 26.0–34.0)
MCHC: 36 g/dL (ref 30.0–36.0)
MCV: 91.6 fL (ref 80.0–100.0)
Monocytes Absolute: 0.3 10*3/uL (ref 0.1–1.0)
Monocytes Relative: 4 %
Neutro Abs: 4.8 10*3/uL (ref 1.7–7.7)
Neutrophils Relative %: 70 %
Platelets: 319 10*3/uL (ref 150–400)
RBC: 5.22 MIL/uL (ref 4.22–5.81)
RDW: 12.1 % (ref 11.5–15.5)
WBC: 6.8 10*3/uL (ref 4.0–10.5)
nRBC: 0 % (ref 0.0–0.2)

## 2024-01-22 LAB — POCT URINE DRUG SCREEN - MANUAL ENTRY (I-SCREEN)
POC Amphetamine UR: NOT DETECTED
POC Buprenorphine (BUP): NOT DETECTED
POC Cocaine UR: NOT DETECTED
POC Marijuana UR: NOT DETECTED
POC Methadone UR: NOT DETECTED
POC Methamphetamine UR: NOT DETECTED
POC Morphine: NOT DETECTED
POC Oxazepam (BZO): NOT DETECTED
POC Oxycodone UR: NOT DETECTED
POC Secobarbital (BAR): NOT DETECTED

## 2024-01-22 LAB — HEMOGLOBIN A1C
Hgb A1c MFr Bld: 5 % (ref 4.8–5.6)
Mean Plasma Glucose: 96.8 mg/dL

## 2024-01-22 LAB — COMPREHENSIVE METABOLIC PANEL WITH GFR
ALT: 46 U/L — ABNORMAL HIGH (ref 0–44)
AST: 44 U/L — ABNORMAL HIGH (ref 15–41)
Albumin: 4.6 g/dL (ref 3.5–5.0)
Alkaline Phosphatase: 60 U/L (ref 38–126)
Anion gap: 14 (ref 5–15)
BUN: 13 mg/dL (ref 6–20)
CO2: 25 mmol/L (ref 22–32)
Calcium: 8.9 mg/dL (ref 8.9–10.3)
Chloride: 99 mmol/L (ref 98–111)
Creatinine, Ser: 0.83 mg/dL (ref 0.61–1.24)
GFR, Estimated: >60 mL/min (ref >60)
Glucose, Bld: 99 mg/dL (ref 70–99)
Potassium: 3.9 mmol/L (ref 3.5–5.1)
Sodium: 138 mmol/L (ref 135–145)
Total Bilirubin: 0.8 mg/dL (ref 0.0–1.2)
Total Protein: 7.5 g/dL (ref 6.5–8.1)

## 2024-01-22 LAB — LIPID PANEL
Cholesterol: 202 mg/dL — ABNORMAL HIGH (ref 0–200)
HDL: 84 mg/dL (ref >40)
LDL Cholesterol: 105 mg/dL — ABNORMAL HIGH (ref 0–99)
Total CHOL/HDL Ratio: 2.4 ratio
Triglycerides: 65 mg/dL (ref <150)
VLDL: 13 mg/dL (ref 0–40)

## 2024-01-22 LAB — TSH: TSH: 0.458 u[IU]/mL (ref 0.350–4.500)

## 2024-01-22 LAB — MAGNESIUM: Magnesium: 1.9 mg/dL (ref 1.7–2.4)

## 2024-01-22 LAB — ETHANOL: Alcohol, Ethyl (B): 109 mg/dL — ABNORMAL HIGH (ref <10)

## 2024-01-22 MED ORDER — THIAMINE MONONITRATE 100 MG PO TABS
100.0000 mg | ORAL_TABLET | Freq: Every day | ORAL | Status: DC
  Administered 2024-01-23: 100 mg via ORAL
  Filled 2024-01-22: qty 1

## 2024-01-22 MED ORDER — LORAZEPAM 1 MG PO TABS
1.0000 mg | ORAL_TABLET | Freq: Four times a day (QID) | ORAL | Status: DC | PRN
  Administered 2024-01-22 – 2024-01-23 (×3): 1 mg via ORAL
  Filled 2024-01-22 (×4): qty 1

## 2024-01-22 MED ORDER — ALUM & MAG HYDROXIDE-SIMETH 200-200-20 MG/5ML PO SUSP
30.0000 mL | ORAL | Status: DC | PRN
  Administered 2024-01-22 – 2024-01-23 (×2): 30 mL via ORAL
  Filled 2024-01-22 (×2): qty 30

## 2024-01-22 MED ORDER — OLANZAPINE 10 MG IM SOLR: 5.0000 mg | Freq: Three times a day (TID) | INTRAMUSCULAR | Status: DC | PRN

## 2024-01-22 MED ORDER — LOPERAMIDE HCL 2 MG PO CAPS: 2.0000 mg | ORAL_CAPSULE | ORAL | Status: DC | PRN

## 2024-01-22 MED ORDER — OLANZAPINE 10 MG IM SOLR: 10.0000 mg | Freq: Three times a day (TID) | INTRAMUSCULAR | Status: DC | PRN

## 2024-01-22 MED ORDER — THIAMINE HCL 100 MG/ML IJ SOLN
100.0000 mg | Freq: Once | INTRAMUSCULAR | Status: AC
  Administered 2024-01-22: 100 mg via INTRAMUSCULAR
  Filled 2024-01-22: qty 2

## 2024-01-22 MED ORDER — HYDROXYZINE HCL 25 MG PO TABS
25.0000 mg | ORAL_TABLET | Freq: Four times a day (QID) | ORAL | Status: DC | PRN
  Administered 2024-01-22 – 2024-01-23 (×3): 25 mg via ORAL
  Filled 2024-01-22 (×3): qty 1

## 2024-01-22 MED ORDER — TRAZODONE HCL 50 MG PO TABS
50.0000 mg | ORAL_TABLET | Freq: Every evening | ORAL | Status: DC | PRN
  Administered 2024-01-22: 50 mg via ORAL
  Filled 2024-01-22: qty 1

## 2024-01-22 MED ORDER — MAGNESIUM HYDROXIDE 400 MG/5ML PO SUSP: 30.0000 mL | Freq: Every day | ORAL | Status: DC | PRN

## 2024-01-22 MED ORDER — ACETAMINOPHEN 325 MG PO TABS
650.0000 mg | ORAL_TABLET | Freq: Four times a day (QID) | ORAL | Status: DC | PRN
  Administered 2024-01-22: 650 mg via ORAL
  Filled 2024-01-22: qty 2

## 2024-01-22 MED ORDER — ADULT MULTIVITAMIN W/MINERALS CH
1.0000 | ORAL_TABLET | Freq: Every day | ORAL | Status: DC
  Administered 2024-01-22 – 2024-01-23 (×2): 1 via ORAL
  Filled 2024-01-22 (×2): qty 1

## 2024-01-22 MED ORDER — LORAZEPAM 1 MG PO TABS
2.0000 mg | ORAL_TABLET | Freq: Once | ORAL | Status: AC
  Administered 2024-01-22: 2 mg via ORAL

## 2024-01-22 MED ORDER — OLANZAPINE 5 MG PO TBDP: 5.0000 mg | ORAL_TABLET | Freq: Three times a day (TID) | ORAL | Status: DC | PRN

## 2024-01-22 NOTE — ED Notes (Signed)
 Patient is resting with eyes closed without any distress noted. Staff will continue to monitor safety per protocol and for changes in condition.

## 2024-01-22 NOTE — ED Provider Notes (Signed)
 [facility] Continuous Assessment Admission H&P  Date: 01/22/24 Patient Name: Trevor [name] MRN: [id number] Chief Complaint: "I relapsed on alcohol"  Diagnoses:  Final diagnoses:  Alcohol use disorder    HPI: Trevor [name] is a 43 year-old male who presents to [facility] voluntarily, requesting help with his alcohol use problem. He reports that he relapsed right after being discharged from [facility] a couple of weeks ago. Patient was residing at [facility] which is supposed to be an Alcohol free facility. However, patient was hiding alcohol bottles in his room until he was caught and was told not to return before 14 days.  Patient reports that he has been struggling  with alcohol use and unable to maintain sobriety. Patient has had multiple visits to the hospital within the last couple of months  but has not been able to maintain sobriety.   Per chart review: patient was treated at [facility] for Alcohol use disorder. He was stabilized and discharged on 12/26/2023, to [facility]. He was also treated at [facility] on 12/24/2023. It is indicated that patient was seen by a counselor Clydie Braun at [facility]) for therapy. Last encounter was 11/26/2023 when patient expressed his interest in attending supportive groups such as AA.   Face-to-face assessment performed by this clinician.  43 year-old male sitting in the assessment room alone. He is casually dressed and groomed. He appears anxious, restless and worried. He is alert and oriented x 4. He denies HI/AVH. His thought process is coherent and goal directed. Patient expressed hopelessness and worthlessness. He reports feeling like a failure and endorses passive suicidal ideations.  Patient reports that he has not been attending the meetings. He reports that alcohol use continues to escalate since the divorce. He reports that he was prescribed Wellbutrin in the past but has not been taking any medications. He reports feeling "lost and  devastated". Patient reports that he tried to stop drinking on his own but started developing withdrawal symptoms including vomiting, anxiety,  chills and tremors. Patient reports that he has been vomiting and his appetite is lost.   Patient reports that he has lost everything due to alcohol use: lost his marriage 6 years ago and currently unable to see his 56 year-old daughter. Patient has not been able to maintain employment. Housing has been an issue for him. Patient reports that his parents and brother used to drink but they have been sober for a while.  Patient reports a hx of seizures: Last activity was a year ago.  He reports no additional medical concerns.   Patient expresses motivation for treatment and he is looking into a rehab program to maintain sobriety. Patient presents with passive suicidal ideations but contracts for safety. He presents with active withdrawal symptoms characterized by anxiety, tremors, nausea/vomiting, and fatigue. Admitted to observation unit and Ativan detox protocol initiated. Patient to be transferred to [facility] upon reevaluation.   Total Time spent with patient: 30 minutes  Musculoskeletal  Strength & Muscle Tone: within normal limits Gait & Station: normal Patient leans: N/A  Psychiatric Specialty Exam  Presentation General Appearance:  Casual  Eye Contact: Fair  Speech: Clear and Coherent  Speech Volume: Normal  Handedness: Right   Mood and Affect  Mood: Anxious; Depressed; Hopeless  Affect: Depressed; Tearful   Thought Process  Thought Processes: Coherent; Goal Directed  Descriptions of Associations:Intact  Orientation:Full (Time, Place and Person)  Thought Content:WDL  Diagnosis of Schizophrenia or Schizoaffective disorder in past: No   Hallucinations:Hallucinations:  None  Ideas of Reference:None  Suicidal Thoughts:Suicidal Thoughts: Yes, Passive  Homicidal Thoughts:Homicidal Thoughts: No   Sensorium  Memory: Immediate  Fair; Recent Fair; Remote Fair  Judgment: Fair  Insight: Fair   Chartered certified accountant: Fair  Attention Span: Fair  Recall: [employer] of Knowledge: Fair  Language: Fair   Psychomotor Activity  Psychomotor Activity: Psychomotor Activity: Restlessness   Assets  Assets: Manufacturing systems engineer; Desire for Improvement; Physical Health   Sleep  Sleep: Sleep: Poor Number of Hours of Sleep: 2   Nutritional Assessment (For OBS and FBC admissions only) Has the patient had a weight loss or gain of 10 pounds or more in the last 3 months?: Yes Has the patient had a decrease in food intake/or appetite?: Yes Does the patient have dental problems?: No Does the patient have eating habits or behaviors that may be indicators of an eating disorder including binging or inducing vomiting?: No Has the patient recently lost weight without trying?: 1 Has the patient been eating poorly because of a decreased appetite?: 1 Malnutrition Screening Tool Score: 2    Physical Exam Vitals and nursing note reviewed.  Constitutional:      Appearance: Normal appearance.  HENT:     Head: Normocephalic and atraumatic.     Right Ear: Tympanic membrane normal.     Left Ear: Tympanic membrane normal.     Nose: Nose normal.     Mouth/Throat:     Mouth: Mucous membranes are moist.  Eyes:     Extraocular Movements: Extraocular movements intact.     Pupils: Pupils are equal, round, and reactive to light.  Cardiovascular:     Rate and Rhythm: Normal rate.     Pulses: Normal pulses.  Pulmonary:     Effort: Pulmonary effort is normal.  Musculoskeletal:        General: Normal range of motion.     Cervical back: Normal range of motion and neck supple.  Neurological:     General: No focal deficit present.     Mental Status: He is alert and oriented to person, place, and time.    Review of Systems  Constitutional: Negative.   HENT: Negative.    Eyes: Negative.   Respiratory:  Negative.    Cardiovascular: Negative.   Gastrointestinal: Negative.   Genitourinary: Negative.   Musculoskeletal: Negative.   Skin: Negative.   Neurological: Negative.   Psychiatric/Behavioral:  Positive for depression, substance abuse and suicidal ideas. The patient is nervous/anxious and has insomnia.     Blood pressure (!) 148/98, pulse 98, temperature 98.2 F (36.8 C), temperature source Oral, resp. rate 18, SpO2 98%. There is no height or weight on file to calculate BMI.  Past Psychiatric History: Alcohol Use Disorder   Is the patient at risk to self? Yes  Has the patient been a risk to self in the past 6 months? No .    Has the patient been a risk to self within the distant past? No   Is the patient a risk to others? No   Has the patient been a risk to others in the past 6 months? No   Has the patient been a risk to others within the distant past? No   Past Medical History: Hx of seizures  Family History:  Both parents and brother used alcohol, now recovered  Social History: Currently unemployed, divorced  Last Labs:  Admission on 12/24/2023, Discharged on 12/25/2023  Component Date Value Ref Range Status   Sodium  12/24/2023 144  135 - 145 mmol/L Final   Potassium 12/24/2023 3.8  3.5 - 5.1 mmol/L Final   Chloride 12/24/2023 107  98 - 111 mmol/L Final   CO2 12/24/2023 26  22 - 32 mmol/L Final   Glucose, Bld 12/24/2023 96  70 - 99 mg/dL Final   Glucose reference range applies only to samples taken after fasting for at least 8 hours.   BUN 12/24/2023 11  6 - 20 mg/dL Final   Creatinine, Ser 12/24/2023 0.62  0.61 - 1.24 mg/dL Final   Calcium 16/07/9603 8.6 (L)  8.9 - 10.3 mg/dL Final   Total Protein 54/06/8118 7.8  6.5 - 8.1 g/dL Final   Albumin 14/78/2956 4.3  3.5 - 5.0 g/dL Final   AST 21/30/8657 34  15 - 41 U/L Final   ALT 12/24/2023 27  0 - 44 U/L Final   Alkaline Phosphatase 12/24/2023 56  38 - 126 U/L Final   Total Bilirubin 12/24/2023 0.4  0.0 - 1.2 mg/dL Final    GFR, Estimated 12/24/2023 >60  >60 mL/min Final   Comment: (NOTE) Calculated using the CKD-EPI Creatinine Equation (2021)    Anion gap 12/24/2023 11  5 - 15 Final   Performed at [facility], [address], [place], [place] [address]   Alcohol, Ethyl (B) 12/24/2023 436 (HH)  <10 mg/dL Final   Comment: CRITICAL RESULT CALLED TO, READ BACK BY AND VERIFIED WITH Barnett Applebaum, RN 12/25/23 0050 BY K. DAVIS (NOTE) Lowest detectable limit for serum alcohol is 10 mg/dL.  For medical purposes only. Performed at [facility], [address], [place], [place] [address]    Opiates 12/25/2023 NONE DETECTED  NONE DETECTED Final   Cocaine 12/25/2023 NONE DETECTED  NONE DETECTED Final   Benzodiazepines 12/25/2023 NONE DETECTED  NONE DETECTED Final   Amphetamines 12/25/2023 NONE DETECTED  NONE DETECTED Final   Tetrahydrocannabinol 12/25/2023 NONE DETECTED  NONE DETECTED Final   Barbiturates 12/25/2023 NONE DETECTED  NONE DETECTED Final   Comment: (NOTE) DRUG SCREEN FOR MEDICAL PURPOSES ONLY.  IF CONFIRMATION IS NEEDED FOR ANY PURPOSE, NOTIFY LAB WITHIN 5 DAYS.  LOWEST DETECTABLE LIMITS FOR URINE DRUG SCREEN Drug Class                     Cutoff (ng/mL) Amphetamine and metabolites    1000 Barbiturate and metabolites    200 Benzodiazepine                 200 Opiates and metabolites        300 Cocaine and metabolites        300 THC                            50 Performed at [facility], [address], [place], [place] [address]    WBC 12/24/2023 5.1  4.0 - 10.5 K/uL Final   RBC 12/24/2023 4.78  4.22 - 5.81 MIL/uL Final   Hemoglobin 12/24/2023 16.0  13.0 - 17.0 g/dL Final   HCT 40/07/2724 45.7  39.0 - 52.0 % Final   MCV 12/24/2023 95.6  80.0 - 100.0 fL Final   MCH 12/24/2023 33.5  26.0 - 34.0 pg Final   MCHC 12/24/2023 35.0  30.0 - 36.0 g/dL Final   RDW 36/64/4034 12.8  11.5 - 15.5 % Final   Platelets 12/24/2023 292  150 - 400 K/uL  Final   nRBC  12/24/2023 0.0  0.0 - 0.2 % Final   Neutrophils Relative % 12/24/2023 61  % Final   Neutro Abs 12/24/2023 3.0  1.7 - 7.7 K/uL Final   Lymphocytes Relative 12/24/2023 31  % Final   Lymphs Abs 12/24/2023 1.6  0.7 - 4.0 K/uL Final   Monocytes Relative 12/24/2023 8  % Final   Monocytes Absolute 12/24/2023 0.4  0.1 - 1.0 K/uL Final   Eosinophils Relative 12/24/2023 0  % Final   Eosinophils Absolute 12/24/2023 0.0  0.0 - 0.5 K/uL Final   Basophils Relative 12/24/2023 0  % Final   Basophils Absolute 12/24/2023 0.0  0.0 - 0.1 K/uL Final   Immature Granulocytes 12/24/2023 0  % Final   Abs Immature Granulocytes 12/24/2023 0.02  0.00 - 0.07 K/uL Final   Performed at [facility], [address] [place], [place] [address]   Lipase 12/24/2023 41  11 - 51 U/L Final   Performed at [facility], [address], [place], [place] [address]   Color, Urine 12/25/2023 YELLOW  YELLOW Final   APPearance 12/25/2023 HAZY (A)  CLEAR Final   Specific Gravity, Urine 12/25/2023 1.010  1.005 - 1.030 Final   pH 12/25/2023 7.0  5.0 - 8.0 Final   Glucose, UA 12/25/2023 NEGATIVE  NEGATIVE mg/dL Final   Hgb urine dipstick 12/25/2023 NEGATIVE  NEGATIVE Final   Bilirubin Urine 12/25/2023 NEGATIVE  NEGATIVE Final   Ketones, ur 12/25/2023 NEGATIVE  NEGATIVE mg/dL Final   Protein, ur 69/62/9528 NEGATIVE  NEGATIVE mg/dL Final   Nitrite 41/32/4401 NEGATIVE  NEGATIVE Final   Leukocytes,Ua 12/25/2023 NEGATIVE  NEGATIVE Final   Performed at [facility], [address] [place], [place] [address]  Admission on 09/23/2023, Discharged on 09/29/2023  Component Date Value Ref Range Status   WBC 09/23/2023 5.2  4.0 - 10.5 K/uL Final   RBC 09/23/2023 5.10  4.22 - 5.81 MIL/uL Final   Hemoglobin 09/23/2023 16.9  13.0 - 17.0 g/dL Final   HCT 36/64/4034 46.5  39.0 - 52.0 % Final   MCV 09/23/2023 91.2  80.0 - 100.0 fL Final   MCH 09/23/2023 33.1  26.0 - 34.0 pg  Final   MCHC 09/23/2023 36.3 (H)  30.0 - 36.0 g/dL Final   RDW 74/25/9563 12.9  11.5 - 15.5 % Final   Platelets 09/23/2023 185  150 - 400 K/uL Final   nRBC 09/23/2023 0.0  0.0 - 0.2 % Final   Performed at [facility], [address] [place], [place] [address]   Sodium 09/23/2023 139  135 - 145 mmol/L Final   Potassium 09/23/2023 3.5  3.5 - 5.1 mmol/L Final   Chloride 09/23/2023 99  98 - 111 mmol/L Final   CO2 09/23/2023 22  22 - 32 mmol/L Final   Glucose, Bld 09/23/2023 169 (H)  70 - 99 mg/dL Final   Glucose reference range applies only to samples taken after fasting for at least 8 hours.   BUN 09/23/2023 6  6 - 20 mg/dL Final   Creatinine, Ser 09/23/2023 0.71  0.61 - 1.24 mg/dL Final   Calcium 33/29/5188 8.4 (L)  8.9 - 10.3 mg/dL Final   Total Protein 41/66/0630 6.9  6.5 - 8.1 g/dL Final   Albumin 16/10/930 4.0  3.5 - 5.0 g/dL Final   AST 35/57/3220 135 (H)  15 - 41 U/L Final   ALT 09/23/2023 76 (H)  0 - 44 U/L Final   Alkaline Phosphatase 09/23/2023 58  38 - 126 U/L Final   Total Bilirubin 09/23/2023 0.9  <1.2 mg/dL Final   GFR, Estimated 09/23/2023 >60  >60 mL/min Final   Comment: (NOTE) Calculated using the CKD-EPI Creatinine Equation (2021)    Anion gap 09/23/2023 18 (H)  5 - 15 Final   Performed at [facility], [address], [place], [place] [address]   Troponin I (High Sensitivity) 09/23/2023 14  <18 ng/L Final   Comment: (NOTE) Elevated high sensitivity troponin I (hsTnI) values and significant  changes across serial measurements may suggest ACS but many other  chronic and acute conditions are known to elevate hsTnI results.  Refer to the "Links" section for chest pain algorithms and additional  guidance. Performed at [facility], [address], [place], [place] [address]    Alcohol, Ethyl (B) 09/23/2023 412 (HH)  <10 mg/dL Final   Comment: CRITICAL RESULT CALLED TO, READ BACK BY AND VERIFIED WITH B. OSORIO, RN AT [phone] 11.27.24  JLASIGAN (NOTE) Lowest detectable limit for serum alcohol is 10 mg/dL.  For medical purposes only. Performed at [facility], [address] [place], [place] [address]    Lipase 09/23/2023 86 (H)  11 - 51 U/L Final   Performed at [facility], [address] [place], [place] [address]   Troponin I (High Sensitivity) 09/23/2023 19 (H)  <18 ng/L Final   Comment: (NOTE) Elevated high sensitivity troponin I (hsTnI) values and significant  changes across serial measurements may suggest ACS but many other  chronic and acute conditions are known to elevate hsTnI results.  Refer to the "Links" section for chest pain algorithms and additional  guidance. Performed at [facility], [address] [place], [place] [address]    Magnesium 09/23/2023 1.8  1.7 - 2.4 mg/dL Final   Performed at [facility], [address] [place], [place] [address]   Phosphorus 09/23/2023 2.2 (L)  2.5 - 4.6 mg/dL Final   Performed at [facility], [address] [place], [place] [address]   Opiates 09/23/2023 NONE DETECTED  NONE DETECTED Final   Cocaine 09/23/2023 NONE DETECTED  NONE DETECTED Final   Benzodiazepines 09/23/2023 NONE DETECTED  NONE DETECTED Final   Amphetamines 09/23/2023 NONE DETECTED  NONE DETECTED Final   Tetrahydrocannabinol 09/23/2023 NONE DETECTED  NONE DETECTED Final   Barbiturates 09/23/2023 NONE DETECTED  NONE DETECTED Final   Comment: (NOTE) DRUG SCREEN FOR MEDICAL PURPOSES ONLY.  IF CONFIRMATION IS NEEDED FOR ANY PURPOSE, NOTIFY LAB WITHIN 5 DAYS.  LOWEST DETECTABLE LIMITS FOR URINE DRUG SCREEN Drug Class                     Cutoff (ng/mL) Amphetamine and metabolites    1000 Barbiturate and metabolites    200 Benzodiazepine                 200 Opiates and metabolites        300 Cocaine and metabolites        300 THC                            50 Performed at [facility], [address] [place], [place] [address]    HIV Screen 4th  Generation wRfx 09/23/2023 Non Reactive  Non Reactive Final   Performed at [facility], [address] [place], [place] [address]  Phosphorus 09/24/2023 3.1  2.5 - 4.6 mg/dL Final   Performed at [facility], [address] [place], [place] [address]   Lipase 09/24/2023 92 (H)  11 - 51 U/L Final   Performed at [facility], [address], [place], [place] [address]   Sodium 09/24/2023 130 (L)  135 - 145 mmol/L Final   DELTA CHECK NOTED   Potassium 09/24/2023 3.8  3.5 - 5.1 mmol/L Final   Chloride 09/24/2023 98  98 - 111 mmol/L Final   CO2 09/24/2023 24  22 - 32 mmol/L Final   Glucose, Bld 09/24/2023 94  70 - 99 mg/dL Final   Glucose reference range applies only to samples taken after fasting for at least 8 hours.   BUN 09/24/2023 9  6 - 20 mg/dL Final   Creatinine, Ser 09/24/2023 0.63  0.61 - 1.24 mg/dL Final   Calcium 29/56/2130 8.5 (L)  8.9 - 10.3 mg/dL Final   Total Protein 86/57/8469 5.6 (L)  6.5 - 8.1 g/dL Final   Albumin 62/95/2841 3.2 (L)  3.5 - 5.0 g/dL Final   AST 32/44/0102 78 (H)  15 - 41 U/L Final   ALT 09/24/2023 54 (H)  0 - 44 U/L Final   Alkaline Phosphatase 09/24/2023 42  38 - 126 U/L Final   Total Bilirubin 09/24/2023 1.2 (H)  <1.2 mg/dL Final   GFR, Estimated 09/24/2023 >60  >60 mL/min Final   Comment: (NOTE) Calculated using the CKD-EPI Creatinine Equation (2021)    Anion gap 09/24/2023 8  5 - 15 Final   Performed at [facility], [address], [place], [place] [address]   WBC 09/24/2023 4.1  4.0 - 10.5 K/uL Final   RBC 09/24/2023 3.97 (L)  4.22 - 5.81 MIL/uL Final   Hemoglobin 09/24/2023 13.2  13.0 - 17.0 g/dL Final   HCT 64/40/3474 37.2 (L)  39.0 - 52.0 % Final   MCV 09/24/2023 93.7  80.0 - 100.0 fL Final   MCH 09/24/2023 33.2  26.0 - 34.0 pg Final   MCHC 09/24/2023 35.5  30.0 - 36.0 g/dL Final   RDW 25/95/6387 12.8  11.5 - 15.5 % Final   Platelets 09/24/2023 130 (L)  150 - 400 K/uL Final   nRBC 09/24/2023 0.0  0.0 - 0.2 % Final    Neutrophils Relative % 09/24/2023 60  % Final   Neutro Abs 09/24/2023 2.5  1.7 - 7.7 K/uL Final   Lymphocytes Relative 09/24/2023 27  % Final   Lymphs Abs 09/24/2023 1.1  0.7 - 4.0 K/uL Final   Monocytes Relative 09/24/2023 11  % Final   Monocytes Absolute 09/24/2023 0.4  0.1 - 1.0 K/uL Final   Eosinophils Relative 09/24/2023 1  % Final   Eosinophils Absolute 09/24/2023 0.0  0.0 - 0.5 K/uL Final   Basophils Relative 09/24/2023 1  % Final   Basophils Absolute 09/24/2023 0.0  0.0 - 0.1 K/uL Final   Immature Granulocytes 09/24/2023 0  % Final   Abs Immature Granulocytes 09/24/2023 0.01  0.00 - 0.07 K/uL Final   Performed at [facility], [address] [place], [place] [address]   Magnesium 09/24/2023 1.6 (L)  1.7 - 2.4 mg/dL Final   Performed at [facility], [address], [place], [place] [address]   WBC 09/25/2023 5.1  4.0 - 10.5 K/uL Final   RBC 09/25/2023 4.20 (L)  4.22 - 5.81 MIL/uL Final   Hemoglobin 09/25/2023 13.5  13.0 -  17.0 g/dL Final   HCT 16/07/9603 40.0  39.0 - 52.0 % Final   MCV 09/25/2023 95.2  80.0 - 100.0 fL Final   MCH 09/25/2023 32.1  26.0 - 34.0 pg Final   MCHC 09/25/2023 33.8  30.0 - 36.0 g/dL Final   RDW 54/06/8118 13.1  11.5 - 15.5 % Final   Platelets 09/25/2023 138 (L)  150 - 400 K/uL Final   REPEATED TO VERIFY   nRBC 09/25/2023 0.0  0.0 - 0.2 % Final   Performed at [facility], [address], [place], [place] [address]   Sodium 09/25/2023 134 (L)  135 - 145 mmol/L Final   Potassium 09/25/2023 3.9  3.5 - 5.1 mmol/L Final   Chloride 09/25/2023 102  98 - 111 mmol/L Final   CO2 09/25/2023 22  22 - 32 mmol/L Final   Glucose, Bld 09/25/2023 92  70 - 99 mg/dL Final   Glucose reference range applies only to samples taken after fasting for at least 8 hours.   BUN 09/25/2023 12  6 - 20 mg/dL Final   Creatinine, Ser 09/25/2023 0.61  0.61 - 1.24 mg/dL Final   Calcium 95/62/1308 9.0  8.9 - 10.3 mg/dL Final   GFR, Estimated 09/25/2023 >60  >60  mL/min Final   Comment: (NOTE) Calculated using the CKD-EPI Creatinine Equation (2021)    Anion gap 09/25/2023 10  5 - 15 Final   Performed at [facility], [address], [place], [place] [address]   Phosphorus 09/25/2023 5.5 (H)  2.5 - 4.6 mg/dL Final   Performed at [facility], [address], [place], [place] [address]   Magnesium 09/25/2023 2.2  1.7 - 2.4 mg/dL Final   Performed at [facility], [address], [place], [place] [address]   Glucose-Capillary 09/25/2023 88  70 - 99 mg/dL Final   Glucose reference range applies only to samples taken after fasting for at least 8 hours.   Glucose-Capillary 09/25/2023 101 (H)  70 - 99 mg/dL Final   Glucose reference range applies only to samples taken after fasting for at least 8 hours.   Glucose-Capillary 09/25/2023 109 (H)  70 - 99 mg/dL Final   Glucose reference range applies only to samples taken after fasting for at least 8 hours.   WBC 09/26/2023 5.2  4.0 - 10.5 K/uL Final   RBC 09/26/2023 4.26  4.22 - 5.81 MIL/uL Final   Hemoglobin 09/26/2023 14.0  13.0 - 17.0 g/dL Final   HCT 32/44/0102 40.3  39.0 - 52.0 % Final   MCV 09/26/2023 94.6  80.0 - 100.0 fL Final   MCH 09/26/2023 32.9  26.0 - 34.0 pg Final   MCHC 09/26/2023 34.7  30.0 - 36.0 g/dL Final   RDW 72/53/6644 13.1  11.5 - 15.5 % Final   Platelets 09/26/2023 143 (L)  150 - 400 K/uL Final   nRBC 09/26/2023 0.0  0.0 - 0.2 % Final   Performed at [facility], [address] [place], [place] [address]   Sodium 09/26/2023 134 (L)  135 - 145 mmol/L Final   Potassium 09/26/2023 4.4  3.5 - 5.1 mmol/L Final   Chloride 09/26/2023 106  98 - 111 mmol/L Final   CO2 09/26/2023 21 (L)  22 - 32 mmol/L Final   Glucose, Bld 09/26/2023 104 (H)  70 - 99 mg/dL Final   Glucose reference range applies only to samples taken after fasting for at least 8 hours.  BUN 09/26/2023 19  6 - 20 mg/dL Final   Creatinine, Ser 09/26/2023 0.60 (L)  0.61 - 1.24 mg/dL Final   Calcium  29/56/2130 8.6 (L)  8.9 - 10.3 mg/dL Final   GFR, Estimated 09/26/2023 >60  >60 mL/min Final   Comment: (NOTE) Calculated using the CKD-EPI Creatinine Equation (2021)    Anion gap 09/26/2023 7  5 - 15 Final   Performed at [facility], [address], [place], [place] [address]   Phosphorus 09/26/2023 3.9  2.5 - 4.6 mg/dL Final   Performed at [facility], [address], [place], [place] [address]   Magnesium 09/26/2023 2.1  1.7 - 2.4 mg/dL Final   Performed at [facility], [address] [place], [place] [address]   WBC 09/27/2023 5.4  4.0 - 10.5 K/uL Final   RBC 09/27/2023 4.42  4.22 - 5.81 MIL/uL Final   Hemoglobin 09/27/2023 14.5  13.0 - 17.0 g/dL Final   HCT 13/24/4010 42.1  39.0 - 52.0 % Final   MCV 09/27/2023 95.2  80.0 - 100.0 fL Final   MCH 09/27/2023 32.8  26.0 - 34.0 pg Final   MCHC 09/27/2023 34.4  30.0 - 36.0 g/dL Final   RDW 27/25/3664 13.0  11.5 - 15.5 % Final   Platelets 09/27/2023 163  150 - 400 K/uL Final   nRBC 09/27/2023 0.0  0.0 - 0.2 % Final   Performed at [facility], [address], [place], [place] [address]   Sodium 09/27/2023 136  135 - 145 mmol/L Final   Potassium 09/27/2023 4.4  3.5 - 5.1 mmol/L Final   Chloride 09/27/2023 104  98 - 111 mmol/L Final   CO2 09/27/2023 25  22 - 32 mmol/L Final   Glucose, Bld 09/27/2023 96  70 - 99 mg/dL Final   Glucose reference range applies only to samples taken after fasting for at least 8 hours.   BUN 09/27/2023 11  6 - 20 mg/dL Final   Creatinine, Ser 09/27/2023 0.75  0.61 - 1.24 mg/dL Final   Calcium 42/59/5638 9.3  8.9 - 10.3 mg/dL Final   GFR, Estimated 09/27/2023 >60  >60 mL/min Final   Comment: (NOTE) Calculated using the CKD-EPI Creatinine Equation (2021)    Anion gap 09/27/2023 7  5 - 15 Final   Performed at [facility], [address], [place], [place] [address]   Phosphorus 09/27/2023 4.5  2.5 - 4.6 mg/dL Final   Performed at [facility], [address]  [place], [place] [address]   Magnesium 09/27/2023 2.2  1.7 - 2.4 mg/dL Final   Performed at [facility], [address], [place], [place] [address]   Glucose-Capillary 09/26/2023 98  70 - 99 mg/dL Final   Glucose reference range applies only to samples taken after fasting for at least 8 hours.   WBC 09/28/2023 5.6  4.0 - 10.5 K/uL Final   RBC 09/28/2023 4.35  4.22 - 5.81 MIL/uL Final   Hemoglobin 09/28/2023 14.5  13.0 - 17.0 g/dL Final   HCT 60/63/0160 42.1  39.0 - 52.0 % Final   MCV 09/28/2023 96.8  80.0 - 100.0 fL Final   MCH 09/28/2023 33.3  26.0 - 34.0 pg Final   MCHC 09/28/2023 34.4  30.0 - 36.0 g/dL Final   RDW 10/93/2355 12.9  11.5 - 15.5 % Final   Platelets 09/28/2023 182  150 - 400 K/uL Final   nRBC 09/28/2023 0.0  0.0 - 0.2 % Final   Performed at [facility], [address], [place], [place] [address]   Sodium 09/28/2023 135  135 - 145 mmol/L Final   Potassium 09/28/2023 4.2  3.5 - 5.1 mmol/L Final   Chloride 09/28/2023 104  98 - 111 mmol/L Final   CO2 09/28/2023 24  22 - 32 mmol/L Final   Glucose, Bld 09/28/2023 102 (H)  70 - 99 mg/dL Final   Glucose reference range applies only to samples taken after fasting for at least 8 hours.   BUN 09/28/2023 16  6 - 20 mg/dL Final   Creatinine, Ser 09/28/2023 0.75  0.61 - 1.24 mg/dL Final   Calcium 60/45/4098 8.8 (L)  8.9 - 10.3 mg/dL Final   GFR, Estimated 09/28/2023 >60  >60 mL/min Final   Comment: (NOTE) Calculated using the CKD-EPI Creatinine Equation (2021)    Anion gap 09/28/2023 7  5 - 15 Final   Performed at [facility], [address] [place], [place] [address]   Phosphorus 09/28/2023 3.9  2.5 - 4.6 mg/dL Final   Performed at [facility], [address], [place], [place] [address]   Magnesium 09/28/2023 1.9  1.7 - 2.4 mg/dL Final   Performed at [facility], [address] [place], [place] [address]   Total Protein 09/28/2023 6.6  6.5 - 8.1 g/dL Final   Albumin 86/57/8469 3.7  3.5 - 5.0 g/dL  Final   AST 62/95/2841 28  15 - 41 U/L Final   ALT 09/28/2023 38  0 - 44 U/L Final   Alkaline Phosphatase 09/28/2023 40  38 - 126 U/L Final   Total Bilirubin 09/28/2023 0.7  <1.2 mg/dL Final   Bilirubin, Direct 09/28/2023 0.1  0.0 - 0.2 mg/dL Final   Indirect Bilirubin 09/28/2023 0.6  0.3 - 0.9 mg/dL Final   Performed at [facility], [address] [place], [place] [address]   Lipase 09/28/2023 84 (H)  11 - 51 U/L Final   Performed at [facility], [address], [place], [place] [address]    Allergies: Patient has no known allergies.  Medications:  Facility Ordered Medications  Medication   acetaminophen (TYLENOL) tablet 650 mg   alum & mag hydroxide-simeth (MAALOX/MYLANTA) 200-200-20 MG/5ML suspension 30 mL   magnesium hydroxide (MILK OF MAGNESIA) suspension 30 mL   OLANZapine zydis (ZYPREXA) disintegrating tablet 5 mg   OLANZapine (ZYPREXA) injection 5 mg   OLANZapine (ZYPREXA) injection 10 mg   traZODone (DESYREL) tablet 50 mg   thiamine (VITAMIN B1) injection 100 mg   [START ON 01/23/2024] thiamine (VITAMIN B1) tablet 100 mg   multivitamin with minerals tablet 1 tablet   LORazepam (ATIVAN) tablet 1 mg   hydrOXYzine (ATARAX) tablet 25 mg   loperamide (IMODIUM) capsule 2-4 mg   PTA Medications  Medication Sig   escitalopram (LEXAPRO) 5 MG tablet Take 1 tablet (5 mg total) by mouth daily.   folic acid (FOLVITE) 1 MG tablet Take 1 tablet (1 mg total) by mouth daily.   thiamine (VITAMIN B1) 100 MG tablet Take 1 tablet (100 mg total) by mouth daily.   Multiple Vitamin (MULTIVITAMIN WITH MINERALS) TABS tablet Take 1 tablet by mouth daily.   chlordiazePOXIDE (LIBRIUM) 25 MG capsule 50mg  PO TID x 1D, then 25-50mg  PO BID X 1D, then 25-50mg  PO QD X 1D      Medical Decision Making   Admit to Observation unit. To be transferred  to [facility] in AM  Medications:   Acetaminophen 650 mg PO Q 6 PRN Maalox 30 ml PO QID PRN Milk of Magnesia 30 ml Daily PRN Trazodone 50 mg  PO HS PRN Ativan Detox protocol Agitation protocol   Labs: CBC, CMP, TSH, A1C, RPR, UDS, UA, Lipid Panel, Hepatic Function panel, Magnesium  EKG     Recommendations  Based on my evaluation the patient does not appear to have an emergency medical condition.  Olin Pia, NP 01/22/24  1:08 PM
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
## 2024-01-22 NOTE — ED Notes (Signed)
 Patient is seen sitting on the recliner bed watching TV without any distress noted. He is calm and cooperative and denies SIHI, AVH, and reports anxiety 8 and depression 6. Patient report seeing floater today but not currently. Patient reports withdrawal symptoms are tremors and anxiety. Staff will continue to monitor safety per protocol and for changes in his condition.

## 2024-01-22 NOTE — ED Notes (Addendum)
 Patient admitted to adult observation for alcohol detox and passive SI. Patient is alert and oriented X4. He denies current SI/HI or AVH. States he last had suicidal thoughts last night with no plan. Patient visibly uncomfortable. He states this is from anxiety. Endorses HA of 6/10. Patient endorses seizures during past detox. He states he does prodromal symptoms before seizure onset. Seizures present as convulsions. Pt with CIWA of 21. 2 mg Ativan given per verbal order. Skin check conducted by this nurse and Annice Pih, MHT. Patient oriented to the unit. He is currently lying on his lounger. He denies any additional needs at this time. We will continue to monitor for safety.

## 2024-01-22 NOTE — ED Notes (Signed)
Patient observed resting quietly, eyes closed. Respirations equal and unlabored. Will continue to monitor for safety.  

## 2024-01-23 ENCOUNTER — Other Ambulatory Visit (HOSPITAL_COMMUNITY)
Admission: EM | Admit: 2024-01-23 | Discharge: 2024-02-02 | Disposition: A | Discharge status: Rehabilitation Facility | Attending: Psychiatry | Admitting: Psychiatry

## 2024-01-23 DIAGNOSIS — F1020 Alcohol dependence, uncomplicated: Secondary | ICD-10-CM

## 2024-01-23 DIAGNOSIS — F331 Major depressive disorder, recurrent, moderate: Secondary | ICD-10-CM

## 2024-01-23 DIAGNOSIS — F10931 Alcohol use, unspecified with withdrawal delirium: Secondary | ICD-10-CM

## 2024-01-23 DIAGNOSIS — A6002 Herpesviral infection of other male genital organs: Secondary | ICD-10-CM | POA: Diagnosis not present

## 2024-01-23 DIAGNOSIS — B182 Chronic viral hepatitis C: Secondary | ICD-10-CM

## 2024-01-23 DIAGNOSIS — F1010 Alcohol abuse, uncomplicated: Secondary | ICD-10-CM

## 2024-01-23 DIAGNOSIS — B009 Herpesviral infection, unspecified: Secondary | ICD-10-CM

## 2024-01-23 DIAGNOSIS — F411 Generalized anxiety disorder: Secondary | ICD-10-CM

## 2024-01-23 DIAGNOSIS — F1090 Alcohol use, unspecified, uncomplicated: Secondary | ICD-10-CM | POA: Diagnosis present

## 2024-01-23 DIAGNOSIS — Z79899 Other long term (current) drug therapy: Secondary | ICD-10-CM | POA: Insufficient documentation

## 2024-01-23 LAB — RPR: RPR Ser Ql: NONREACTIVE

## 2024-01-23 MED ORDER — MAGNESIUM HYDROXIDE 400 MG/5ML PO SUSP
30.0000 mL | Freq: Every day | ORAL | Status: DC | PRN
  Administered 2024-01-25: 30 mL via ORAL
  Filled 2024-01-23: qty 30

## 2024-01-23 MED ORDER — TRAZODONE HCL 50 MG PO TABS: 50.0000 mg | ORAL_TABLET | Freq: Every evening | ORAL | Status: DC | PRN

## 2024-01-23 MED ORDER — SERTRALINE HCL 50 MG PO TABS
50.0000 mg | ORAL_TABLET | Freq: Every day | ORAL | Status: DC
  Administered 2024-01-24 – 2024-01-30 (×7): 50 mg via ORAL
  Filled 2024-01-23 (×7): qty 1

## 2024-01-23 MED ORDER — HYDROXYZINE HCL 25 MG PO TABS
25.0000 mg | ORAL_TABLET | Freq: Four times a day (QID) | ORAL | Status: AC | PRN
  Administered 2024-01-23 – 2024-01-24 (×3): 25 mg via ORAL
  Filled 2024-01-23 (×4): qty 1

## 2024-01-23 MED ORDER — ADULT MULTIVITAMIN W/MINERALS CH
1.0000 | ORAL_TABLET | Freq: Every day | ORAL | Status: DC
  Administered 2024-01-24 – 2024-01-31 (×8): 1 via ORAL
  Filled 2024-01-23 (×8): qty 1

## 2024-01-23 MED ORDER — THIAMINE MONONITRATE 100 MG PO TABS
100.0000 mg | ORAL_TABLET | Freq: Every day | ORAL | Status: DC
  Administered 2024-01-24 – 2024-01-31 (×8): 100 mg via ORAL
  Filled 2024-01-23 (×8): qty 1

## 2024-01-23 MED ORDER — OLANZAPINE 5 MG PO TBDP: 5.0000 mg | ORAL_TABLET | Freq: Three times a day (TID) | ORAL | Status: DC | PRN

## 2024-01-23 MED ORDER — ACETAMINOPHEN 325 MG PO TABS: 650.0000 mg | ORAL_TABLET | Freq: Four times a day (QID) | ORAL | Status: DC | PRN

## 2024-01-23 MED ORDER — SERTRALINE HCL 25 MG PO TABS
25.0000 mg | ORAL_TABLET | Freq: Once | ORAL | Status: AC
  Administered 2024-01-23: 25 mg via ORAL
  Filled 2024-01-23: qty 1

## 2024-01-23 MED ORDER — LORAZEPAM 1 MG PO TABS
1.0000 mg | ORAL_TABLET | Freq: Every day | ORAL | Status: AC
  Administered 2024-01-26: 1 mg via ORAL
  Filled 2024-01-23: qty 1

## 2024-01-23 MED ORDER — ALUM & MAG HYDROXIDE-SIMETH 200-200-20 MG/5ML PO SUSP: 30.0000 mL | ORAL | Status: DC | PRN

## 2024-01-23 MED ORDER — LOPERAMIDE HCL 2 MG PO CAPS: 2.0000 mg | ORAL_CAPSULE | ORAL | Status: AC | PRN

## 2024-01-23 MED ORDER — LORAZEPAM 1 MG PO TABS
1.0000 mg | ORAL_TABLET | Freq: Two times a day (BID) | ORAL | Status: AC
  Administered 2024-01-25 (×2): 1 mg via ORAL
  Filled 2024-01-23 (×2): qty 1

## 2024-01-23 MED ORDER — LORAZEPAM 1 MG PO TABS
1.0000 mg | ORAL_TABLET | Freq: Four times a day (QID) | ORAL | Status: AC | PRN
  Filled 2024-01-23: qty 1

## 2024-01-23 MED ORDER — ONDANSETRON 4 MG PO TBDP: 4.0000 mg | ORAL_TABLET | Freq: Four times a day (QID) | ORAL | Status: AC | PRN

## 2024-01-23 MED ORDER — TRAZODONE HCL 50 MG PO TABS
50.0000 mg | ORAL_TABLET | Freq: Every evening | ORAL | Status: DC | PRN
  Administered 2024-01-23 – 2024-01-30 (×7): 50 mg via ORAL
  Filled 2024-01-23 (×8): qty 1

## 2024-01-23 MED ORDER — LORAZEPAM 1 MG PO TABS
1.0000 mg | ORAL_TABLET | Freq: Three times a day (TID) | ORAL | Status: AC
  Administered 2024-01-24 (×3): 1 mg via ORAL
  Filled 2024-01-23 (×3): qty 1

## 2024-01-23 MED ORDER — OLANZAPINE 10 MG IM SOLR: 5.0000 mg | Freq: Three times a day (TID) | INTRAMUSCULAR | Status: DC | PRN

## 2024-01-23 MED ORDER — OLANZAPINE 10 MG IM SOLR: 10.0000 mg | Freq: Three times a day (TID) | INTRAMUSCULAR | Status: DC | PRN

## 2024-01-23 MED ORDER — LORAZEPAM 1 MG PO TABS
1.0000 mg | ORAL_TABLET | Freq: Four times a day (QID) | ORAL | Status: AC
  Administered 2024-01-23 (×3): 1 mg via ORAL
  Filled 2024-01-23 (×2): qty 1

## 2024-01-23 NOTE — Group Note (Signed)
 Group Topic: Communication  Group Date: 01/23/2024 Start Time: 1100 End Time: 1125 Facilitators: Prentice Docker, RN  Department: [facility]  Number of Participants: 1  Group Focus: communication Treatment Modality:  Individual Therapy Interventions utilized were orientation Purpose: increase insight  Name: Trevor [name] Date of Birth: Jul 07, 1981  MR: [id number]    Level of Participation: active Quality of Participation: attentive and cooperative Interactions with others: gave feedback Mood/Affect: appropriate Triggers (if applicable): none identified Cognition: coherent/clear Progress: Gaining insight Response: Pt verbalized understanding of FBC unit and consent for tx signed Plan: patient will be encouraged to remain compliant throughout tx process  Patients Problems:  Patient Active Problem List   Diagnosis Date Noted   Alcohol use disorder 01/23/2024   Hyponatremia 06/19/2022   Transaminitis 06/18/2022   GERD (gastroesophageal reflux disease) 06/18/2022   Hypokalemia 06/18/2022   Loss of consciousness (HCC) 06/18/2022   QT prolongation 06/18/2022   Chronic hepatitis C without hepatic coma (HCC) 06/28/2019   Alcohol abuse 06/28/2019   Psoriasis 06/07/2019   Suicidal ideation 06/07/2019   Withdrawal symptoms, alcohol, with delirium (HCC) 06/07/2019   Alcoholic hepatitis without ascites 06/05/2019   Alcohol withdrawal (HCC) 06/03/2019   Obesity (BMI 30-39.9) 01/09/2016   HSV-2 (herpes simplex virus 2) infection

## 2024-01-23 NOTE — ED Notes (Signed)
 Patient in the dayroom calm and composed watching TV with other patients. Denies SI/HI/AVH. Respirations even and unlabored. Will monitor for safety.

## 2024-01-23 NOTE — ED Notes (Signed)
 Pt sitting in dayroom watching television and interacting with peers. No acute distress noted. No concerns voiced. Informed pt to notify staff with any needs or assistance. Pt verbalized understanding and agreement. Will continue to monitor for safety.

## 2024-01-23 NOTE — ED Provider Notes (Signed)
 FBC/OBS ASAP Discharge Summary  Date and Time: 01/23/2024 10:17 AM  Name: Trevor [name]  MRN:  [id number]   Discharge Diagnoses:  Final diagnoses:  Alcohol use disorder    Subjective: "I feel better"  Stay Summary:   Trevor [name] is a 43 year old male who was admitted to [facility] unit yesterday.  He reported that he relapsed right after being discharged from [facility] a couple of weeks ago. Patient was residing at [facility] which is supposed to be an Alcohol free facility. However, patient was hiding alcohol bottles in his room until he was caught and was told not to return before 14 days.  Patient reported that he has been struggling  with alcohol use and unable to maintain sobriety. Patient has had multiple visits to the hospital within the last couple of months  but has not been able to maintain sobriety (see previous notes).    Face-to-face assessment performed by this clinician.  43 year-old male in bed, awake. He is casually dressed and groomed. He appears calm , and cooperative though he was anxious earlier per nursing report. He is alert and oriented x 4. He denies HI/AVH. His thought process is coherent and goal directed. Patient continues to express hopelessness but also expresses  motivation for treatment. He reports that medications are helping with his alcohol detox process.  Patient reports that he experienced increased withdrawal symptoms earlier, inclusing anxiety, nausea and tremors and medications eased the symptoms.  Patient reports that his goal is to complete treatment then review disposition with treatment team. He is looking into a long term sobriety to reestablish his life.    Patient expresses remorse, shame and guilt. He continues to report that "I lost my marriage, my job, my everything due to alcohol".  He remains cooperative with treatment and his symptoms are decreasing.   Patient expresses motivation for treatment and he is looking into a  rehab program to maintain sobriety. He is currently denying  suicidal ideations . He is reeducated about treatment process and verbalizes understanding. He will be discharged from Observation unit, to be admitted to [facility] per facility protocol.  Total Time spent with patient: 20 minutes  Past Psychiatric History: Alcohol abuse Past Medical History: NA Family History: NA Family Psychiatric History: Both parents and brother are recovered from alcoholism Social History: Currently homeless. Was kicked out of the [employer] secondary to alcohol use Tobacco Cessation:  Prescription not provided because: patient refused  Current Medications:  Current Facility-Administered Medications  Medication Dose Route Frequency Provider Last Rate Last Admin   acetaminophen (TYLENOL) tablet 650 mg  650 mg Oral Q6H PRN Marlou Sa, NP   650 mg at 01/22/24 1616   alum & mag hydroxide-simeth (MAALOX/MYLANTA) 200-200-20 MG/5ML suspension 30 mL  30 mL Oral Q4H PRN Marlou Sa, NP   30 mL at 01/23/24 0848   hydrOXYzine (ATARAX) tablet 25 mg  25 mg Oral Q6H PRN Marlou Sa, NP   25 mg at 01/23/24 0944   loperamide (IMODIUM) capsule 2-4 mg  2-4 mg Oral PRN Marlou Sa, NP       LORazepam (ATIVAN) tablet 1 mg  1 mg Oral Q6H PRN Rayburn Go, Ronae Noell M, NP   1 mg at 01/23/24 0848   magnesium hydroxide (MILK OF MAGNESIA) suspension 30 mL  30 mL Oral Daily PRN Marlou Sa, NP       multivitamin with minerals tablet 1 tablet  1 tablet Oral Daily Kaytelynn Scripter,  Avanell Shackleton, NP   1 tablet at 01/23/24 0944   OLANZapine (ZYPREXA) injection 10 mg  10 mg Intramuscular TID PRN Marlou Sa, NP       OLANZapine [facility]) injection 5 mg  5 mg Intramuscular TID PRN Rayburn Go, Nitasha Jewel M, NP       OLANZapine zydis (ZYPREXA) disintegrating tablet 5 mg  5 mg Oral TID PRN Marlou Sa, NP       thiamine (VITAMIN B1) tablet 100 mg  100 mg Oral Daily Rayburn Go, Dekota Shenk M, NP   100  mg at 01/23/24 0944   traZODone (DESYREL) tablet 50 mg  50 mg Oral QHS PRN Marlou Sa, NP   50 mg at 01/22/24 2126   Current Outpatient Medications  Medication Sig Dispense Refill   Multiple Vitamin (MULTIVITAMIN WITH MINERALS) TABS tablet Take 1 tablet by mouth daily.     famotidine (PEPCID) 20 MG tablet Take 20 mg by mouth 2 (two) times daily. (Patient not taking: Reported on 01/22/2024)     hydrOXYzine (ATARAX) 50 MG tablet Take 50 mg by mouth every 8 (eight) hours as needed for anxiety. (Patient not taking: Reported on 01/22/2024)     thiamine (VITAMIN B1) 100 MG tablet Take 1 tablet (100 mg total) by mouth daily. (Patient not taking: Reported on 01/22/2024) 30 tablet 0   traZODone (DESYREL) 150 MG tablet Take 150 mg by mouth at bedtime. (Patient not taking: Reported on 01/22/2024)      PTA Medications:  Facility Ordered Medications  Medication   acetaminophen (TYLENOL) tablet 650 mg   alum & mag hydroxide-simeth (MAALOX/MYLANTA) 200-200-20 MG/5ML suspension 30 mL   magnesium hydroxide (MILK OF MAGNESIA) suspension 30 mL   OLANZapine zydis (ZYPREXA) disintegrating tablet 5 mg   OLANZapine (ZYPREXA) injection 5 mg   OLANZapine (ZYPREXA) injection 10 mg   traZODone (DESYREL) tablet 50 mg   [COMPLETED] thiamine (VITAMIN B1) injection 100 mg   thiamine (VITAMIN B1) tablet 100 mg   multivitamin with minerals tablet 1 tablet   LORazepam (ATIVAN) tablet 1 mg   hydrOXYzine (ATARAX) tablet 25 mg   loperamide (IMODIUM) capsule 2-4 mg   [COMPLETED] LORazepam (ATIVAN) tablet 2 mg   PTA Medications  Medication Sig   thiamine (VITAMIN B1) 100 MG tablet Take 1 tablet (100 mg total) by mouth daily. (Patient not taking: Reported on 01/22/2024)   famotidine (PEPCID) 20 MG tablet Take 20 mg by mouth 2 (two) times daily. (Patient not taking: Reported on 01/22/2024)   hydrOXYzine (ATARAX) 50 MG tablet Take 50 mg by mouth every 8 (eight) hours as needed for anxiety. (Patient not taking: Reported  on 01/22/2024)   traZODone (DESYREL) 150 MG tablet Take 150 mg by mouth at bedtime. (Patient not taking: Reported on 01/22/2024)       01/23/2024   10:16 AM 01/22/2024    1:06 PM 10/22/2023   11:17 AM  Depression screen PHQ 2/9  Decreased Interest 2 2   Down, Depressed, Hopeless 2 2 0  PHQ - 2 Score 4 4 0  Altered sleeping 2 1   Tired, decreased energy 1 1   Change in appetite 1 1   Feeling bad or failure about yourself  1 1   Trouble concentrating 1 1   Moving slowly or fidgety/restless 1 1   Suicidal thoughts 1 1   PHQ-9 Score 12 11   Difficult doing work/chores Very difficult Very difficult     Flowsheet Row ED from [facility]/22/2024 in [facility] from 12/24/2023 in [facility] ED from 12/23/2023 in [facility]  C-SSRS RISK CATEGORY Error: Q3, 4, or 5 should not be populated when Q2 is No No Risk No Risk       Musculoskeletal  Strength & Muscle Tone: within normal limits Gait & Station: normal Patient leans: N/A  Psychiatric Specialty Exam  Presentation  General Appearance:  Casual  Eye Contact: Fair  Speech: Clear and Coherent  Speech Volume: Normal  Handedness: Right   Mood and Affect  Mood: Anxious; Depressed  Affect: Depressed   Thought Process  Thought Processes: Coherent; Goal Directed  Descriptions of Associations:Intact  Orientation:Full (Time, Place and Person)  Thought Content:Logical; WDL  Diagnosis of Schizophrenia or Schizoaffective disorder in past: No    Hallucinations:Hallucinations: None  Ideas of Reference:None  Suicidal Thoughts:Suicidal Thoughts: No  Homicidal Thoughts:Homicidal Thoughts: No   Sensorium  Memory: Immediate Fair; Recent Fair; Remote Fair  Judgment: Fair  Insight: Fair   Art therapist  Concentration: Fair  Attention Span: Fair  Recall: [employer] of  Knowledge: Fair  Language: Fair   Psychomotor Activity  Psychomotor Activity: Psychomotor Activity: Restlessness   Assets  Assets: Communication Skills; Desire for Improvement; Physical Health   Sleep  Sleep: Sleep: Poor Number of Hours of Sleep: 2   Nutritional Assessment (For OBS and FBC admissions only) Has the patient had a weight loss or gain of 10 pounds or more in the last 3 months?: Yes Has the patient had a decrease in food intake/or appetite?: Yes Does the patient have dental problems?: Yes Does the patient have eating habits or behaviors that may be indicators of an eating disorder including binging or inducing vomiting?: No Has the patient recently lost weight without trying?: 1 Has the patient been eating poorly because of a decreased appetite?: 1 Malnutrition Screening Tool Score: 2    Physical Exam  Physical Exam Vitals and nursing note reviewed.  Constitutional:      Appearance: Normal appearance.  HENT:     Head: Normocephalic and atraumatic.     Right Ear: Tympanic membrane normal.     Left Ear: Tympanic membrane normal.     Nose: Nose normal.     Mouth/Throat:     Mouth: Mucous membranes are moist.  Eyes:     Extraocular Movements: Extraocular movements intact.     Pupils: Pupils are equal, round, and reactive to light.  Cardiovascular:     Rate and Rhythm: Normal rate.     Pulses: Normal pulses.  Pulmonary:     Effort: Pulmonary effort is normal.  Musculoskeletal:        General: Normal range of motion.     Cervical back: Normal range of motion and neck supple.  Neurological:     General: No focal deficit present.     Mental Status: He is alert and oriented to person, place, and time.    Review of Systems  Constitutional: Negative.   HENT: Negative.    Eyes: Negative.   Respiratory: Negative.    Cardiovascular: Negative.   Gastrointestinal: Negative.   Genitourinary: Negative.   Musculoskeletal: Negative.   Skin: Negative.    Neurological: Negative.   Endo/Heme/Allergies: Negative.   Psychiatric/Behavioral:  Positive for depression and substance abuse.    Blood pressure 130/80, pulse 88, temperature 97.6 F (36.4 C), temperature source Oral, resp. rate 16, SpO2 99%. There is no height or weight on file to  calculate BMI.  Demographic Factors:  Male, Low socioeconomic status, and Unemployed  Loss Factors: Loss of significant relationship and Financial problems/change in socioeconomic status  Historical Factors: NA  Risk Reduction Factors:   NA  Continued Clinical Symptoms:  Depression:   Hopelessness Alcohol/Substance Abuse/Dependencies Previous Psychiatric Diagnoses and Treatments  Cognitive Features That Contribute To Risk:  None    Suicide Risk:  Mild:  Suicidal ideation of limited frequency, intensity, duration, and specificity.  There are no identifiable plans, no associated intent, mild dysphoria and related symptoms, good self-control (both objective and subjective assessment), few other risk factors, and identifiable protective factors, including available and accessible social support.  Plan Of Care/Follow-up recommendations:  Activity:  As tolerated Diet:  regular  Disposition: Discharge from Observation unit. To be admitted to [facility]. Accepted by Dr Renaldo Fiddler, MD  Olin Pia, NP 01/23/2024, 10:17 AM

## 2024-01-23 NOTE — ED Notes (Signed)
 Pt to be transferred to [facility] per provider

## 2024-01-23 NOTE — ED Notes (Signed)
 Provider made aware of flagged BP. Pt received scheduled dose of Ativan. Tolerated well. No new orders given. Will monitor for safety.

## 2024-01-23 NOTE — ED Notes (Signed)
 Pt transferred from observation unit to [facility] requesting ETOH detox. Denies SI/HI/AVH. Pt reports last drink being 1 day ago. Pt reports being kicked out of the [facility] due to getting caught drinking. Pt states, "I can return in 14 days but I want to pursue other options to see which is the best for me". Calm, cooperative throughout interview process. Skin assessment completed. Oriented to unit. Meal and drink offered. Informed pt to notify staff with any needs or concerns. Pt verbalized understanding and agreement. Pt verbally contract for safety. Will monitor for safety.

## 2024-01-23 NOTE — Group Note (Signed)
 Group Topic: Relapse and Recovery  Group Date: 01/23/2024 Start Time: 2000 End Time: 2100 Facilitators: Guss Bunde  Department: [facility]  Number of Participants: 6  Group Focus: chemical dependency education Treatment Modality:   Interventions utilized were support Purpose: relapse prevention strategies  Name: Trevor [name] Date of Birth: 1980/11/23  MR: [id number]    Level of Participation: active Quality of Participation: attentive Interactions with others: gave feedback Mood/Affect: appropriate Triggers (if applicable):  Cognition: goal directed Progress: Gaining insight Response: AA Facilitator Plan: patient will be encouraged to follow up with sobriety goals  Patients Problems:

## 2024-01-23 NOTE — ED Notes (Signed)
 Pt sleeping at this time. Rise and fall of chest noted. Pt in NAD at this time. Will continue to monitor.

## 2024-01-23 NOTE — ED Notes (Signed)
 Pt still experiencing withdrawal s/s: nausea, headache, anxiety, tremors, sweating. Pt A&Ox4, ambulatory w/ steady gait, VSS, calm and cooperative. Denies SI/HI/AVH. Will continue to monitor pt.

## 2024-01-23 NOTE — ED Notes (Signed)
 Patient in the bedroom calm and sleeping.NAD, Respirations even and unlabored. Will monitor closely for safety.

## 2024-01-23 NOTE — ED Notes (Signed)
 Pt sleeping@this  time breathing even and unlabored will continue to monitor for safety

## 2024-01-23 NOTE — Group Note (Signed)
 Group Topic: Balance in Life  Group Date: 01/23/2024 Start Time: 1215 End Time: 1315 Facilitators: Vonzell Schlatter B  Department: [facility]  Number of Participants: 5  Group Focus: affirmation and daily focus Treatment Modality:  Psychoeducation Interventions utilized were patient education and support Purpose: increase insight and reinforce self-care  Name: Trevor [name] Date of Birth: [birth year]  MR: [id number]    Level of Participation: active Quality of Participation: attentive and cooperative Interactions with others: gave feedback Mood/Affect: positive Triggers (if applicable): N/A Cognition: coherent/clear Progress: Moderate Response: pt is glad he can get help for his addiction and wants to thank everyone for this facility Plan: follow-up needed  Patients Problems:  Patient Active Problem List   Diagnosis Date Noted   Alcohol use disorder 01/23/2024   Hyponatremia 06/19/2022   Transaminitis 06/18/2022   GERD (gastroesophageal reflux disease) 06/18/2022   Hypokalemia 06/18/2022   Loss of consciousness (HCC) 06/18/2022   QT prolongation 06/18/2022   Chronic hepatitis C without hepatic coma (HCC) 06/28/2019   Alcohol abuse 06/28/2019   Psoriasis 06/07/2019   Suicidal ideation 06/07/2019   Withdrawal symptoms, alcohol, with delirium (HCC) 06/07/2019   Alcoholic hepatitis without ascites 06/05/2019   Alcohol withdrawal (HCC) 06/03/2019   Obesity (BMI 30-39.9) 01/09/2016   HSV-2 (herpes simplex virus 2) infection

## 2024-01-23 NOTE — ED Provider Notes (Signed)
 Facility Based Crisis Admission H&P  Date: 01/23/24 Patient Name: Trevor [name] MRN: [id number] Chief Complaint: "I want to detox off EtOH"  Diagnoses:  Final diagnoses:  None    HPI:  Antonino Nienhuis. Sgroi is a 43 yr old male who presented on 3/28 to [facility] requesting help for EtOH Abuse, he was admitted to [facility] on 3/29.  PPHx is significant for Depression, Anxiety, EtOH Abuse, and has been to Residential Rehab 4 times, and no history of Suicide Attempts, Self Injurious Behavior, or Psychiatric Hospitalizations.  PMHx is significant for Withdrawal Seizures.  He reports that he presented to the hospital because he had been on a 2-week binge and did not know where else to turn but knew he needed help.  He reports that this was triggered by him recently being let go from his job.  He reports that he had been drinking 1-1/2 pints of vodka a day.  He reports he started drinking when he was 43 years old.  He reports that it started to become a problem in high school and just got worse when he went to college and joined a frat.  He reports that his longest period of sobriety was 6 months.  He does report a history of withdrawal seizures.  He reports that he has been to residential treatment 4 times.  He reports that his drinking has lost him multiple jobs and his wife.  He reports past psychiatric history significant for depression, anxiety, and alcohol abuse.  He reports no history of suicide attempts.  He reports no history of self-injurious behavior.  He reports no history of psychiatric hospitalizations.  He reports past medical history significant for withdrawal seizures.  He reports a past surgical history significant for left arm surgery when he was a kid.  He does report a history of seizures-withdrawal seizures.  He does report a history of 1 concussion from [place] while in high school.  He reports NKDA.  He reports he has been living at an [facility].  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  He reports drinking 1-1/2 pints of vodka a day prior to admission.  He reports his last drink was Thursday night/Friday morning.  He reports no tobacco use.  He reports a remote history of cocaine and ecstasy use-last while in college.  He reports no current legal issues.  He does report access to firearms but that they are currently secured.  Discussed with him that given his history of withdrawal seizures we would start him on a scheduled Ativan taper and is agreeable with this.  Asked about prior medication trials and he reports the longest he was ever on was Wellbutrin for 6 months.  He also reports repeatedly being on Lexapro and Lamictal.  Discussed trialing Zoloft for his depression and anxiety.  Discussed potential risks and side effects and he was agreeable to the trial.  He does report withdrawal symptoms of headache, shakes, malaise, nausea, and sweats.  Discussed with him that as needed withdrawal medications were ordered and all he needed to do was asked the nurse with him.  He reports he is interested in residential rehab.  Discussed with him that social work would be able to discuss this with him on Monday and help provide a list of options for him.  He reports no other concerns present.   PHQ 2-9:  Flowsheet Row ED from 01/23/2024 in [facility] ED from 01/22/2024 in [facility]  Thoughts that you would be better off dead, or of hurting yourself in some way Not at all Several days  PHQ-9 Total Score 14 12       Flowsheet Row ED from 01/23/2024 in [facility] ED from 01/22/2024 in [facility] ED from 12/24/2023 in [facility] Emergency Department at [facility]  C-SSRS RISK CATEGORY Low Risk Error: Q3, 4, or 5 should not be populated when Q2 is No No Risk         Total Time spent with patient:  I  personally spent 60 minutes on the unit in direct patient care. The direct patient care time included face-to-face time with the patient, reviewing the patient's chart, communicating with other professionals, and coordinating care. Greater than 50% of this time was spent in counseling or coordinating care with the patient regarding goals of hospitalization, psycho-education, and discharge planning needs.   Musculoskeletal  Strength & Muscle Tone: within normal limits Gait & Station: normal Patient leans: N/A  Psychiatric Specialty Exam  Presentation General Appearance:  Appropriate for Environment  Eye Contact: Good  Speech: Clear and Coherent; Normal Rate  Speech Volume: Normal  Handedness: Right   Mood and Affect  Mood: Anxious; Depressed  Affect: Congruent; Depressed   Thought Process  Thought Processes: Coherent; Goal Directed  Descriptions of Associations:Intact  Orientation:Full (Time, Place and Person)  Thought Content:Logical; WDL  Diagnosis of Schizophrenia or Schizoaffective disorder in past: No   Hallucinations:Hallucinations: None  Ideas of Reference:None  Suicidal Thoughts:Suicidal Thoughts: No  Homicidal Thoughts:Homicidal Thoughts: No   Sensorium  Memory: Immediate Fair; Recent Fair  Judgment: Fair  Insight: Fair   Art therapist  Concentration: Fair  Attention Span: Fair  Recall: [employer] of Knowledge: Fair  Language: Fair   Psychomotor Activity  Psychomotor Activity: Psychomotor Activity: Normal   Assets  Assets: Communication Skills; Desire for Improvement; Resilience; Physical Health   Sleep  Sleep: Sleep: Poor Number of Hours of Sleep: 2   Nutritional Assessment (For OBS and FBC admissions only) Has the patient had a weight loss or gain of 10 pounds or more in the last 3 months?: Yes Has the patient had a decrease in food intake/or appetite?: Yes Does the patient have dental problems?:  Yes Does the patient have eating habits or behaviors that may be indicators of an eating disorder including binging or inducing vomiting?: No Has the patient recently lost weight without trying?: 1 Has the patient been eating poorly because of a decreased appetite?: 1 Malnutrition Screening Tool Score: 2    Physical Exam Vitals and nursing note reviewed.  Constitutional:      General: He is not in acute distress.    Appearance: Normal appearance. He is normal weight. He is not ill-appearing or toxic-appearing.  HENT:     Head: Normocephalic and atraumatic.  Pulmonary:     Effort: Pulmonary effort is normal.  Musculoskeletal:        General: Normal range of motion.  Neurological:     General: No focal deficit present.     Mental Status: He is alert.    Review of Systems  Constitutional:  Positive for diaphoresis and malaise/fatigue.  Respiratory:  Negative for cough and shortness of breath.   Cardiovascular:  Negative for chest pain.  Gastrointestinal:  Positive for nausea. Negative for abdominal pain, constipation, diarrhea and vomiting.  Neurological:  Positive  for tremors and headaches. Negative for dizziness and weakness.  Psychiatric/Behavioral:  Positive for depression and substance abuse. Negative for hallucinations and suicidal ideas. The patient is nervous/anxious.     Blood pressure (!) 142/84, pulse (!) 101, temperature 97.9 F (36.6 C), resp. rate 18, SpO2 97%. There is no height or weight on file to calculate BMI.  Past Psychiatric History: Depression, Anxiety, EtOH Abuse, and has been to Residential Rehab 4 times, and no history of Suicide Attempts, Self Injurious Behavior, or Psychiatric Hospitalizations.   Is the patient at risk to self? No  Has the patient been a risk to self in the past 6 months? No .    Has the patient been a risk to self within the distant past? No   Is the patient a risk to others? No   Has the patient been a risk to others in the past 6  months? No   Has the patient been a risk to others within the distant past? No   Past Medical History:  Withdrawal Seizures  Family History:  Mother- Skin Cancer Paternal Grandfather- Colon Cancer Paternal Grandmother and Grandfather- Lung Cancer  Social History: Divorced, has a daughter.  Recently fired from his job.  Started drinking at age 5, longest period of sobriety 6 months, been to Residential Rehab 4 times.  Last Labs:  Admission on 01/22/2024, Discharged on 01/23/2024  Component Date Value Ref Range Status   WBC 01/22/2024 6.8  4.0 - 10.5 K/uL Final   RBC 01/22/2024 5.22  4.22 - 5.81 MIL/uL Final   Hemoglobin 01/22/2024 17.2 (H)  13.0 - 17.0 g/dL Final   HCT 16/07/9603 47.8  39.0 - 52.0 % Final   MCV 01/22/2024 91.6  80.0 - 100.0 fL Final   MCH 01/22/2024 33.0  26.0 - 34.0 pg Final   MCHC 01/22/2024 36.0  30.0 - 36.0 g/dL Final   RDW 54/06/8118 12.1  11.5 - 15.5 % Final   Platelets 01/22/2024 319  150 - 400 K/uL Final   nRBC 01/22/2024 0.0  0.0 - 0.2 % Final   Neutrophils Relative % 01/22/2024 70  % Final   Neutro Abs 01/22/2024 4.8  1.7 - 7.7 K/uL Final   Lymphocytes Relative 01/22/2024 25  % Final   Lymphs Abs 01/22/2024 1.7  0.7 - 4.0 K/uL Final   Monocytes Relative 01/22/2024 4  % Final   Monocytes Absolute 01/22/2024 0.3  0.1 - 1.0 K/uL Final   Eosinophils Relative 01/22/2024 0  % Final   Eosinophils Absolute 01/22/2024 0.0  0.0 - 0.5 K/uL Final   Basophils Relative 01/22/2024 1  % Final   Basophils Absolute 01/22/2024 0.1  0.0 - 0.1 K/uL Final   Immature Granulocytes 01/22/2024 0  % Final   Abs Immature Granulocytes 01/22/2024 0.02  0.00 - 0.07 K/uL Final   Performed at [facility], [address] [place], [place] [address]   Sodium 01/22/2024 138  135 - 145 mmol/L Final   Potassium 01/22/2024 3.9  3.5 - 5.1 mmol/L Final   Chloride 01/22/2024 99  98 - 111 mmol/L Final   CO2 01/22/2024 25  22 - 32 mmol/L Final   Glucose, Bld 01/22/2024 99  70 - 99  mg/dL Final   Glucose reference range applies only to samples taken after fasting for at least 8 hours.   BUN 01/22/2024 13  6 - 20 mg/dL Final   Creatinine, Ser 01/22/2024 0.83  0.61 - 1.24 mg/dL Final   Calcium 95/62/1308  8.9  8.9 - 10.3 mg/dL Final   Total Protein 13/05/6577 7.5  6.5 - 8.1 g/dL Final   Albumin 46/96/2952 4.6  3.5 - 5.0 g/dL Final   AST 84/13/2440 44 (H)  15 - 41 U/L Final   ALT 01/22/2024 46 (H)  0 - 44 U/L Final   Alkaline Phosphatase 01/22/2024 60  38 - 126 U/L Final   Total Bilirubin 01/22/2024 0.8  0.0 - 1.2 mg/dL Final   GFR, Estimated 01/22/2024 >60  >60 mL/min Final   Comment: (NOTE) Calculated using the CKD-EPI Creatinine Equation (2021)    Anion gap 01/22/2024 14  5 - 15 Final   Performed at [facility], [address], [place], [place] [address]   Hgb A1c MFr Bld 01/22/2024 5.0  4.8 - 5.6 % Final   Comment: (NOTE) Pre diabetes:          5.7%-6.4%  Diabetes:              >6.4%  Glycemic control for   <7.0% adults with diabetes    Mean Plasma Glucose 01/22/2024 96.8  mg/dL Final   Performed at [facility], [address] [place], [place] [address]   Magnesium 01/22/2024 1.9  1.7 - 2.4 mg/dL Final   Performed at [facility], [address], [place], [place] [address]   Alcohol, Ethyl (B) 01/22/2024 109 (H)  <10 mg/dL Final   Comment: (NOTE) Lowest detectable limit for serum alcohol is 10 mg/dL.  For medical purposes only. Performed at [facility], [address], [place], [place] [address]    Cholesterol 01/22/2024 202 (H)  0 - 200 mg/dL Final   Triglycerides 63/87/5643 65  <150 mg/dL Final   HDL 32/95/1884 84  >40 mg/dL Final   Total CHOL/HDL Ratio 01/22/2024 2.4  RATIO Final   VLDL 01/22/2024 13  0 - 40 mg/dL Final   LDL Cholesterol 01/22/2024 105 (H)  0 - 99 mg/dL Final   Comment:        Total Cholesterol/HDL:CHD Risk Coronary Heart Disease Risk Table                     Men   Women  1/2 Average Risk   3.4    3.3  Average Risk       5.0   4.4  2 X Average Risk   9.6   7.1  3 X Average Risk  23.4   11.0        Use the calculated Patient Ratio above and the CHD Risk Table to determine the patient's CHD Risk.        ATP III CLASSIFICATION (LDL):  <100     mg/dL   Optimal  166-063  mg/dL   Near or Above                    Optimal  130-159  mg/dL   Borderline  016-010  mg/dL   High  >932     mg/dL   Very High Performed at [facility], [address], [place], [place] [address]    TSH 01/22/2024 0.458  0.350 - 4.500 uIU/mL Final   Comment: Performed by a 3rd Generation assay with a functional sensitivity of <=0.01 uIU/mL. Performed at [facility], [address], [place], [place] [address]    RPR Ser Ql 01/22/2024 NON REACTIVE  NON REACTIVE Final   Performed at [facility], [address] [place], [place] [address]   Color, Urine 01/22/2024 YELLOW  YELLOW Final   APPearance 01/22/2024 CLEAR  CLEAR Final   Specific Gravity, Urine 01/22/2024 1.020  1.005 - 1.030 Final   pH 01/22/2024 5.0  5.0 - 8.0 Final   Glucose, UA 01/22/2024 NEGATIVE  NEGATIVE mg/dL Final   Hgb urine dipstick 01/22/2024 NEGATIVE  NEGATIVE Final   Bilirubin Urine 01/22/2024 NEGATIVE  NEGATIVE Final   Ketones, ur 01/22/2024 5 (A)  NEGATIVE mg/dL Final   Protein, ur 19/14/7829 NEGATIVE  NEGATIVE mg/dL Final   Nitrite 56/21/3086 NEGATIVE  NEGATIVE Final   Leukocytes,Ua 01/22/2024 NEGATIVE  NEGATIVE Final   Performed at [facility], [address] [place], [place] [address]   POC Amphetamine UR 01/22/2024 None Detected   Final   POC Secobarbital (BAR) 01/22/2024 None Detected   Final   POC Buprenorphine (BUP) 01/22/2024 None Detected   Final   POC Oxazepam (BZO) 01/22/2024 None Detected   Final   POC Cocaine UR 01/22/2024 None Detected   Final   POC Methamphetamine UR 01/22/2024 None Detected   Final   POC Morphine 01/22/2024 None Detected   Final   POC Methadone UR 01/22/2024 None Detected    Final   POC Oxycodone UR 01/22/2024 None Detected   Final   POC Marijuana UR 01/22/2024 None Detected   Final  Admission on 12/24/2023, Discharged on 12/25/2023  Component Date Value Ref Range Status   Sodium 12/24/2023 144  135 - 145 mmol/L Final   Potassium 12/24/2023 3.8  3.5 - 5.1 mmol/L Final   Chloride 12/24/2023 107  98 - 111 mmol/L Final   CO2 12/24/2023 26  22 - 32 mmol/L Final   Glucose, Bld 12/24/2023 96  70 - 99 mg/dL Final   Glucose reference range applies only to samples taken after fasting for at least 8 hours.   BUN 12/24/2023 11  6 - 20 mg/dL Final   Creatinine, Ser 12/24/2023 0.62  0.61 - 1.24 mg/dL Final   Calcium 96/29/5284 8.6 (L)  8.9 - 10.3 mg/dL Final   Total Protein 13/24/4010 7.8  6.5 - 8.1 g/dL Final   Albumin 27/25/3664 4.3  3.5 - 5.0 g/dL Final   AST 40/34/7425 34  15 - 41 U/L Final   ALT 12/24/2023 27  0 - 44 U/L Final   Alkaline Phosphatase 12/24/2023 56  38 - 126 U/L Final   Total Bilirubin 12/24/2023 0.4  0.0 - 1.2 mg/dL Final   GFR, Estimated 12/24/2023 >60  >60 mL/min Final   Comment: (NOTE) Calculated using the CKD-EPI Creatinine Equation (2021)    Anion gap 12/24/2023 11  5 - 15 Final   Performed at [facility], [address] [place], [place] [address]   Alcohol, Ethyl (B) 12/24/2023 436 (HH)  <10 mg/dL Final   Comment: CRITICAL RESULT CALLED TO, READ BACK BY AND VERIFIED WITH Barnett Applebaum, RN 12/25/23 0050 BY K. DAVIS (NOTE) Lowest detectable limit for serum alcohol is 10 mg/dL.  For medical purposes only. Performed at [facility], [address], [place], [place] [address]    Opiates 12/25/2023 NONE DETECTED  NONE DETECTED Final   Cocaine 12/25/2023 NONE DETECTED  NONE DETECTED Final   Benzodiazepines 12/25/2023 NONE DETECTED  NONE DETECTED Final   Amphetamines 12/25/2023 NONE DETECTED  NONE DETECTED Final   Tetrahydrocannabinol 12/25/2023 NONE DETECTED  NONE DETECTED Final   Barbiturates 12/25/2023  NONE DETECTED  NONE  DETECTED Final   Comment: (NOTE) DRUG SCREEN FOR MEDICAL PURPOSES ONLY.  IF CONFIRMATION IS NEEDED FOR ANY PURPOSE, NOTIFY LAB WITHIN 5 DAYS.  LOWEST DETECTABLE LIMITS FOR URINE DRUG SCREEN Drug Class                     Cutoff (ng/mL) Amphetamine and metabolites    1000 Barbiturate and metabolites    200 Benzodiazepine                 200 Opiates and metabolites        300 Cocaine and metabolites        300 THC                            50 Performed at [facility], [address] [place], [place] [address]    WBC 12/24/2023 5.1  4.0 - 10.5 K/uL Final   RBC 12/24/2023 4.78  4.22 - 5.81 MIL/uL Final   Hemoglobin 12/24/2023 16.0  13.0 - 17.0 g/dL Final   HCT 19/14/7829 45.7  39.0 - 52.0 % Final   MCV 12/24/2023 95.6  80.0 - 100.0 fL Final   MCH 12/24/2023 33.5  26.0 - 34.0 pg Final   MCHC 12/24/2023 35.0  30.0 - 36.0 g/dL Final   RDW 56/21/3086 12.8  11.5 - 15.5 % Final   Platelets 12/24/2023 292  150 - 400 K/uL Final   nRBC 12/24/2023 0.0  0.0 - 0.2 % Final   Neutrophils Relative % 12/24/2023 61  % Final   Neutro Abs 12/24/2023 3.0  1.7 - 7.7 K/uL Final   Lymphocytes Relative 12/24/2023 31  % Final   Lymphs Abs 12/24/2023 1.6  0.7 - 4.0 K/uL Final   Monocytes Relative 12/24/2023 8  % Final   Monocytes Absolute 12/24/2023 0.4  0.1 - 1.0 K/uL Final   Eosinophils Relative 12/24/2023 0  % Final   Eosinophils Absolute 12/24/2023 0.0  0.0 - 0.5 K/uL Final   Basophils Relative 12/24/2023 0  % Final   Basophils Absolute 12/24/2023 0.0  0.0 - 0.1 K/uL Final   Immature Granulocytes 12/24/2023 0  % Final   Abs Immature Granulocytes 12/24/2023 0.02  0.00 - 0.07 K/uL Final   Performed at [facility], [address] [place], [place] [address]   Lipase 12/24/2023 41  11 - 51 U/L Final   Performed at [facility], [address] [place], [place] [address]   Color, Urine 12/25/2023 YELLOW  YELLOW Final    APPearance 12/25/2023 HAZY (A)  CLEAR Final   Specific Gravity, Urine 12/25/2023 1.010  1.005 - 1.030 Final   pH 12/25/2023 7.0  5.0 - 8.0 Final   Glucose, UA 12/25/2023 NEGATIVE  NEGATIVE mg/dL Final   Hgb urine dipstick 12/25/2023 NEGATIVE  NEGATIVE Final   Bilirubin Urine 12/25/2023 NEGATIVE  NEGATIVE Final   Ketones, ur 12/25/2023 NEGATIVE  NEGATIVE mg/dL Final   Protein, ur 28/41/3244 NEGATIVE  NEGATIVE mg/dL Final   Nitrite 10/29/7251 NEGATIVE  NEGATIVE Final   Leukocytes,Ua 12/25/2023 NEGATIVE  NEGATIVE Final   Performed at [facility], [address], [place], [place] [address]  Admission on 09/23/2023, Discharged on 09/29/2023  Component Date Value Ref Range Status   WBC 09/23/2023 5.2  4.0 - 10.5 K/uL Final   RBC 09/23/2023 5.10  4.22 - 5.81 MIL/uL Final   Hemoglobin 09/23/2023 16.9  13.0 - 17.0 g/dL  Final   HCT 09/23/2023 46.5  39.0 - 52.0 % Final   MCV 09/23/2023 91.2  80.0 - 100.0 fL Final   MCH 09/23/2023 33.1  26.0 - 34.0 pg Final   MCHC 09/23/2023 36.3 (H)  30.0 - 36.0 g/dL Final   RDW 16/07/9603 12.9  11.5 - 15.5 % Final   Platelets 09/23/2023 185  150 - 400 K/uL Final   nRBC 09/23/2023 0.0  0.0 - 0.2 % Final   Performed at [facility], [address], [place], [place] [address]   Sodium 09/23/2023 139  135 - 145 mmol/L Final   Potassium 09/23/2023 3.5  3.5 - 5.1 mmol/L Final   Chloride 09/23/2023 99  98 - 111 mmol/L Final   CO2 09/23/2023 22  22 - 32 mmol/L Final   Glucose, Bld 09/23/2023 169 (H)  70 - 99 mg/dL Final   Glucose reference range applies only to samples taken after fasting for at least 8 hours.   BUN 09/23/2023 6  6 - 20 mg/dL Final   Creatinine, Ser 09/23/2023 0.71  0.61 - 1.24 mg/dL Final   Calcium 11/91/4782 8.4 (L)  8.9 - 10.3 mg/dL Final   Total Protein 95/62/1308 6.9  6.5 - 8.1 g/dL Final   Albumin 65/78/4696 4.0  3.5 - 5.0 g/dL Final   AST 29/52/8413 135 (H)  15 - 41 U/L Final   ALT 09/23/2023 76 (H)  0 - 44 U/L  Final   Alkaline Phosphatase 09/23/2023 58  38 - 126 U/L Final   Total Bilirubin 09/23/2023 0.9  <1.2 mg/dL Final   GFR, Estimated 09/23/2023 >60  >60 mL/min Final   Comment: (NOTE) Calculated using the CKD-EPI Creatinine Equation (2021)    Anion gap 09/23/2023 18 (H)  5 - 15 Final   Performed at [facility], [address] [place], [place] [address]   Troponin I (High Sensitivity) 09/23/2023 14  <18 ng/L Final   Comment: (NOTE) Elevated high sensitivity troponin I (hsTnI) values and significant  changes across serial measurements may suggest ACS but many other  chronic and acute conditions are known to elevate hsTnI results.  Refer to the "Links" section for chest pain algorithms and additional  guidance. Performed at [facility], [address] [place], [place] [address]    Alcohol, Ethyl (B) 09/23/2023 412 (HH)  <10 mg/dL Final   Comment: CRITICAL RESULT CALLED TO, READ BACK BY AND VERIFIED WITH B. OSORIO, RN AT [phone] 11.27.24 JLASIGAN (NOTE) Lowest detectable limit for serum alcohol is 10 mg/dL.  For medical purposes only. Performed at [facility], [address] [place], [place] [address]    Lipase 09/23/2023 86 (H)  11 - 51 U/L Final   Performed at [facility], [address] [place], [place] [address]   Troponin I (High Sensitivity) 09/23/2023 19 (H)  <18 ng/L Final   Comment: (NOTE) Elevated high sensitivity troponin I (hsTnI) values and significant  changes across serial measurements may suggest ACS but many other  chronic and acute conditions are known to elevate hsTnI results.  Refer to the "Links" section for chest pain algorithms and additional  guidance. Performed at [facility], [address], [place], [place] [address]    Magnesium 09/23/2023 1.8  1.7 - 2.4 mg/dL Final   Performed at [facility], [address], [place], [place] [address]   Phosphorus 09/23/2023 2.2 (L)  2.5 - 4.6 mg/dL Final  Performed at [facility], [address], [place], [place] [address]   Opiates 09/23/2023 NONE DETECTED  NONE DETECTED Final   Cocaine 09/23/2023 NONE DETECTED  NONE DETECTED Final   Benzodiazepines 09/23/2023 NONE DETECTED  NONE DETECTED Final   Amphetamines 09/23/2023 NONE DETECTED  NONE DETECTED Final   Tetrahydrocannabinol 09/23/2023 NONE DETECTED  NONE DETECTED Final   Barbiturates 09/23/2023 NONE DETECTED  NONE DETECTED Final   Comment: (NOTE) DRUG SCREEN FOR MEDICAL PURPOSES ONLY.  IF CONFIRMATION IS NEEDED FOR ANY PURPOSE, NOTIFY LAB WITHIN 5 DAYS.  LOWEST DETECTABLE LIMITS FOR URINE DRUG SCREEN Drug Class                     Cutoff (ng/mL) Amphetamine and metabolites    1000 Barbiturate and metabolites    200 Benzodiazepine                 200 Opiates and metabolites        300 Cocaine and metabolites        300 THC                            50 Performed at [facility], [address] [place], [place] [address]    HIV Screen 4th Generation wRfx 09/23/2023 Non Reactive  Non Reactive Final   Performed at [facility], [address], [place], [place] [address]   Phosphorus 09/24/2023 3.1  2.5 - 4.6 mg/dL Final   Performed at [facility], [address] [place], [place] [address]   Lipase 09/24/2023 92 (H)  11 - 51 U/L Final   Performed at [facility], [address], [place], [place] [address]   Sodium 09/24/2023 130 (L)  135 - 145 mmol/L Final   DELTA CHECK NOTED   Potassium 09/24/2023 3.8  3.5 - 5.1 mmol/L Final   Chloride 09/24/2023 98  98 - 111 mmol/L Final   CO2 09/24/2023 24  22 - 32 mmol/L Final   Glucose, Bld 09/24/2023 94  70 - 99 mg/dL Final   Glucose reference range applies only to samples taken after fasting for at least 8 hours.   BUN 09/24/2023 9  6 - 20 mg/dL Final   Creatinine, Ser 09/24/2023 0.63  0.61 - 1.24 mg/dL Final   Calcium 53/66/4403 8.5 (L)  8.9 - 10.3 mg/dL Final   Total Protein 47/42/5956 5.6 (L)  6.5 - 8.1 g/dL Final    Albumin 38/75/6433 3.2 (L)  3.5 - 5.0 g/dL Final   AST 29/51/8841 78 (H)  15 - 41 U/L Final   ALT 09/24/2023 54 (H)  0 - 44 U/L Final   Alkaline Phosphatase 09/24/2023 42  38 - 126 U/L Final   Total Bilirubin 09/24/2023 1.2 (H)  <1.2 mg/dL Final   GFR, Estimated 09/24/2023 >60  >60 mL/min Final   Comment: (NOTE) Calculated using the CKD-EPI Creatinine Equation (2021)    Anion gap 09/24/2023 8  5 - 15 Final   Performed at [facility], [address], [place], [place] [address]   WBC 09/24/2023 4.1  4.0 - 10.5 K/uL Final   RBC 09/24/2023 3.97 (L)  4.22 - 5.81 MIL/uL Final   Hemoglobin 09/24/2023 13.2  13.0 - 17.0 g/dL Final   HCT 01/60/1093 37.2 (L)  39.0 - 52.0 % Final   MCV 09/24/2023 93.7  80.0 - 100.0 fL Final  MCH 09/24/2023 33.2  26.0 - 34.0 pg Final   MCHC 09/24/2023 35.5  30.0 - 36.0 g/dL Final   RDW 16/07/9603 12.8  11.5 - 15.5 % Final   Platelets 09/24/2023 130 (L)  150 - 400 K/uL Final   nRBC 09/24/2023 0.0  0.0 - 0.2 % Final   Neutrophils Relative % 09/24/2023 60  % Final   Neutro Abs 09/24/2023 2.5  1.7 - 7.7 K/uL Final   Lymphocytes Relative 09/24/2023 27  % Final   Lymphs Abs 09/24/2023 1.1  0.7 - 4.0 K/uL Final   Monocytes Relative 09/24/2023 11  % Final   Monocytes Absolute 09/24/2023 0.4  0.1 - 1.0 K/uL Final   Eosinophils Relative 09/24/2023 1  % Final   Eosinophils Absolute 09/24/2023 0.0  0.0 - 0.5 K/uL Final   Basophils Relative 09/24/2023 1  % Final   Basophils Absolute 09/24/2023 0.0  0.0 - 0.1 K/uL Final   Immature Granulocytes 09/24/2023 0  % Final   Abs Immature Granulocytes 09/24/2023 0.01  0.00 - 0.07 K/uL Final   Performed at [facility], [address] [place], [place] [address]   Magnesium 09/24/2023 1.6 (L)  1.7 - 2.4 mg/dL Final   Performed at [facility], [address], [place], [place] [address]   WBC 09/25/2023 5.1  4.0 - 10.5 K/uL Final   RBC 09/25/2023 4.20 (L)  4.22 - 5.81 MIL/uL Final   Hemoglobin 09/25/2023 13.5   13.0 - 17.0 g/dL Final   HCT 78/29/5621 40.0  39.0 - 52.0 % Final   MCV 09/25/2023 95.2  80.0 - 100.0 fL Final   MCH 09/25/2023 32.1  26.0 - 34.0 pg Final   MCHC 09/25/2023 33.8  30.0 - 36.0 g/dL Final   RDW 30/86/5784 13.1  11.5 - 15.5 % Final   Platelets 09/25/2023 138 (L)  150 - 400 K/uL Final   REPEATED TO VERIFY   nRBC 09/25/2023 0.0  0.0 - 0.2 % Final   Performed at [facility], [address], [place], [place] [address]   Sodium 09/25/2023 134 (L)  135 - 145 mmol/L Final   Potassium 09/25/2023 3.9  3.5 - 5.1 mmol/L Final   Chloride 09/25/2023 102  98 - 111 mmol/L Final   CO2 09/25/2023 22  22 - 32 mmol/L Final   Glucose, Bld 09/25/2023 92  70 - 99 mg/dL Final   Glucose reference range applies only to samples taken after fasting for at least 8 hours.   BUN 09/25/2023 12  6 - 20 mg/dL Final   Creatinine, Ser 09/25/2023 0.61  0.61 - 1.24 mg/dL Final   Calcium 52/84/1324 9.0  8.9 - 10.3 mg/dL Final   GFR, Estimated 09/25/2023 >60  >60 mL/min Final   Comment: (NOTE) Calculated using the CKD-EPI Creatinine Equation (2021)    Anion gap 09/25/2023 10  5 - 15 Final   Performed at [facility], [address], [place], [place] [address]   Phosphorus 09/25/2023 5.5 (H)  2.5 - 4.6 mg/dL Final   Performed at [facility], [address], [place], [place] [address]   Magnesium 09/25/2023 2.2  1.7 - 2.4 mg/dL Final   Performed at [facility], [address], [place], [place] [address]   Glucose-Capillary 09/25/2023 88  70 - 99 mg/dL Final   Glucose reference range applies only to samples taken after fasting for at least 8 hours.   Glucose-Capillary 09/25/2023 101 (H)  70 - 99 mg/dL Final   Glucose reference range applies only to samples taken after fasting for at least 8 hours.   Glucose-Capillary 09/25/2023 109 (H)  70 - 99 mg/dL Final   Glucose reference range applies only to samples taken after fasting for at least 8 hours.   WBC 09/26/2023 5.2  4.0 - 10.5 K/uL  Final   RBC 09/26/2023 4.26  4.22 - 5.81 MIL/uL Final   Hemoglobin 09/26/2023 14.0  13.0 - 17.0 g/dL Final   HCT 56/21/3086 40.3  39.0 - 52.0 % Final   MCV 09/26/2023 94.6  80.0 - 100.0 fL Final   MCH 09/26/2023 32.9  26.0 - 34.0 pg Final   MCHC 09/26/2023 34.7  30.0 - 36.0 g/dL Final   RDW 57/84/6962 13.1  11.5 - 15.5 % Final   Platelets 09/26/2023 143 (L)  150 - 400 K/uL Final   nRBC 09/26/2023 0.0  0.0 - 0.2 % Final   Performed at [facility], [address], [place], [place] [address]   Sodium 09/26/2023 134 (L)  135 - 145 mmol/L Final   Potassium 09/26/2023 4.4  3.5 - 5.1 mmol/L Final   Chloride 09/26/2023 106  98 - 111 mmol/L Final   CO2 09/26/2023 21 (L)  22 - 32 mmol/L Final   Glucose, Bld 09/26/2023 104 (H)  70 - 99 mg/dL Final   Glucose reference range applies only to samples taken after fasting for at least 8 hours.   BUN 09/26/2023 19  6 - 20 mg/dL Final   Creatinine, Ser 09/26/2023 0.60 (L)  0.61 - 1.24 mg/dL Final   Calcium 13/24/4010 8.6 (L)  8.9 - 10.3 mg/dL Final   GFR, Estimated 09/26/2023 >60  >60 mL/min Final   Comment: (NOTE) Calculated using the CKD-EPI Creatinine Equation (2021)    Anion gap 09/26/2023 7  5 - 15 Final   Performed at [facility], [address], [place], [place] [address]   Phosphorus 09/26/2023 3.9  2.5 - 4.6 mg/dL Final   Performed at [facility], [address] [place], [place] [address]   Magnesium 09/26/2023 2.1  1.7 - 2.4 mg/dL Final   Performed at [facility], [address], [place], [place] [address]   WBC 09/27/2023 5.4  4.0 - 10.5 K/uL Final   RBC 09/27/2023 4.42  4.22 - 5.81 MIL/uL Final   Hemoglobin 09/27/2023 14.5  13.0 - 17.0 g/dL Final   HCT 59/56/3875 42.1  39.0 - 52.0 % Final   MCV 09/27/2023 95.2  80.0 - 100.0 fL Final   MCH 09/27/2023 32.8  26.0 - 34.0 pg Final   MCHC 09/27/2023 34.4  30.0 - 36.0 g/dL Final   RDW 64/33/2951 13.0  11.5 - 15.5 % Final   Platelets 09/27/2023 163  150 - 400 K/uL  Final   nRBC 09/27/2023 0.0  0.0 - 0.2 % Final   Performed at [facility], [address], [place], [place] [address]   Sodium 09/27/2023 136  135 - 145 mmol/L Final   Potassium 09/27/2023 4.4  3.5 - 5.1 mmol/L Final   Chloride 09/27/2023 104  98 - 111 mmol/L Final   CO2 09/27/2023 25  22 - 32 mmol/L Final   Glucose, Bld 09/27/2023 96  70 - 99 mg/dL Final   Glucose reference range applies only to samples taken after fasting for at least 8 hours.   BUN 09/27/2023 11  6 - 20 mg/dL Final  Creatinine, Ser 09/27/2023 0.75  0.61 - 1.24 mg/dL Final   Calcium 45/40/9811 9.3  8.9 - 10.3 mg/dL Final   GFR, Estimated 09/27/2023 >60  >60 mL/min Final   Comment: (NOTE) Calculated using the CKD-EPI Creatinine Equation (2021)    Anion gap 09/27/2023 7  5 - 15 Final   Performed at [facility], [address] [place], [place] [address]   Phosphorus 09/27/2023 4.5  2.5 - 4.6 mg/dL Final   Performed at [facility], [address] [place], [place] [address]   Magnesium 09/27/2023 2.2  1.7 - 2.4 mg/dL Final   Performed at [facility], [address] [place], [place] [address]   Glucose-Capillary 09/26/2023 98  70 - 99 mg/dL Final   Glucose reference range applies only to samples taken after fasting for at least 8 hours.   WBC 09/28/2023 5.6  4.0 - 10.5 K/uL Final   RBC 09/28/2023 4.35  4.22 - 5.81 MIL/uL Final   Hemoglobin 09/28/2023 14.5  13.0 - 17.0 g/dL Final   HCT 57/84/6962 42.1  39.0 - 52.0 % Final   MCV 09/28/2023 96.8  80.0 - 100.0 fL Final   MCH 09/28/2023 33.3  26.0 - 34.0 pg Final   MCHC 09/28/2023 34.4  30.0 - 36.0 g/dL Final   RDW 95/28/4132 12.9  11.5 - 15.5 % Final   Platelets 09/28/2023 182  150 - 400 K/uL Final   nRBC 09/28/2023 0.0  0.0 - 0.2 % Final   Performed at [facility], [address], [place], [place] [address]   Sodium 09/28/2023 135  135 - 145 mmol/L Final   Potassium 09/28/2023 4.2  3.5 - 5.1 mmol/L Final   Chloride 09/28/2023 104   98 - 111 mmol/L Final   CO2 09/28/2023 24  22 - 32 mmol/L Final   Glucose, Bld 09/28/2023 102 (H)  70 - 99 mg/dL Final   Glucose reference range applies only to samples taken after fasting for at least 8 hours.   BUN 09/28/2023 16  6 - 20 mg/dL Final   Creatinine, Ser 09/28/2023 0.75  0.61 - 1.24 mg/dL Final   Calcium 27/25/3664 8.8 (L)  8.9 - 10.3 mg/dL Final   GFR, Estimated 09/28/2023 >60  >60 mL/min Final   Comment: (NOTE) Calculated using the CKD-EPI Creatinine Equation (2021)    Anion gap 09/28/2023 7  5 - 15 Final   Performed at [facility], [address] [place], [place] [address]   Phosphorus 09/28/2023 3.9  2.5 - 4.6 mg/dL Final   Performed at [facility], [address] [place], [place] [address]   Magnesium 09/28/2023 1.9  1.7 - 2.4 mg/dL Final   Performed at [facility], [address], [place], [place] [address]   Total Protein 09/28/2023 6.6  6.5 - 8.1 g/dL Final   Albumin 64/33/2951 3.7  3.5 - 5.0 g/dL Final   AST 88/41/6606 28  15 - 41 U/L Final   ALT 09/28/2023 38  0 - 44 U/L Final   Alkaline Phosphatase 09/28/2023 40  38 - 126 U/L Final   Total Bilirubin 09/28/2023 0.7  <1.2 mg/dL Final   Bilirubin, Direct 09/28/2023 0.1  0.0 - 0.2 mg/dL Final   Indirect Bilirubin 09/28/2023 0.6  0.3 - 0.9 mg/dL Final   Performed at [facility], [address] [place], [place] [address]   Lipase 09/28/2023 84 (H)  11 - 51  U/L Final   Performed at [facility], [address] [place], [place] [address]    Allergies: Patient has no known allergies.  Medications:  Facility Ordered Medications  Medication   hydrOXYzine (ATARAX) tablet 25 mg   loperamide (IMODIUM) capsule 2-4 mg   LORazepam (ATIVAN) tablet 1 mg   [START ON 01/24/2024] multivitamin with minerals tablet 1 tablet   [START ON 01/24/2024] thiamine (VITAMIN B1) tablet 100 mg   acetaminophen (TYLENOL) tablet 650 mg   alum & mag hydroxide-simeth (MAALOX/MYLANTA) 200-200-20 MG/5ML  suspension 30 mL   magnesium hydroxide (MILK OF MAGNESIA) suspension 30 mL   traZODone (DESYREL) tablet 50 mg   OLANZapine zydis (ZYPREXA) disintegrating tablet 5 mg   OLANZapine (ZYPREXA) injection 5 mg   OLANZapine (ZYPREXA) injection 10 mg   LORazepam (ATIVAN) tablet 1 mg   Followed by   Melene Muller ON 01/24/2024] LORazepam (ATIVAN) tablet 1 mg   Followed by   Melene Muller ON 01/25/2024] LORazepam (ATIVAN) tablet 1 mg   Followed by   Melene Muller ON 01/26/2024] LORazepam (ATIVAN) tablet 1 mg   ondansetron (ZOFRAN-ODT) disintegrating tablet 4 mg   sertraline (ZOLOFT) tablet 25 mg   Followed by   Melene Muller ON 01/24/2024] sertraline (ZOLOFT) tablet 50 mg   PTA Medications  Medication Sig   thiamine (VITAMIN B1) 100 MG tablet Take 1 tablet (100 mg total) by mouth daily. (Patient not taking: Reported on 01/22/2024)   famotidine (PEPCID) 20 MG tablet Take 20 mg by mouth 2 (two) times daily. (Patient not taking: Reported on 01/22/2024)   hydrOXYzine (ATARAX) 50 MG tablet Take 50 mg by mouth every 8 (eight) hours as needed for anxiety. (Patient not taking: Reported on 01/22/2024)   traZODone (DESYREL) 150 MG tablet Take 150 mg by mouth at bedtime. (Patient not taking: Reported on 01/22/2024)   Multiple Vitamin (MULTIVITAMIN WITH MINERALS) TABS tablet Take 1 tablet by mouth daily.    Long Term Goals: Improvement in symptoms so as ready for discharge  Short Term Goals: Patient will verbalize feelings in meetings with treatment team members., Patient will attend at least of 50% of the groups daily., Pt will complete the PHQ9 on admission, day 3 and discharge., Patient will participate in completing the [place] Suicide Severity Rating Scale, Patient will score a low risk of violence for 24 hours prior to discharge, and Patient will take medications as prescribed daily.  Medical Decision Making   [name]. Rosiles is a 43 yr old male who presented on 3/28 to [facility] requesting help for EtOH Abuse, he was admitted to [facility] on  3/29.  PPHx is significant for Depression, Anxiety, EtOH Abuse, and has been to Residential Rehab 4 times, and no history of Suicide Attempts, Self Injurious Behavior, or Psychiatric Hospitalizations.  PMHx is significant for Withdrawal Seizures.  Daeshaun does have a history of withdrawal seizures.  Due to this we will start a schedule Ativan taper.  We will start other withdrawal as needed medications and CIWA.  To address his depression and anxiety we will trial Zoloft.  He is interested in residential rehab and appreciate social work's assistance with this on Monday.  We will continue to monitor.   MDD, Recurrent, Moderate  GAD: -Start Zoloft 25 mg and increase to 50 mg tomorrow for depression and anxiety. -Continue Agitation Protocol: Zyprexa   Withdrawal: -Start CIWA -Start Ativan taper to end 4/2 -Start Ativan 1 mg q6 PRN CIWA>10 -Start Imodium 2-4 mg PRN diarrhea -Start Zofran-ODT 4 mg q6 PRN  nausea -Start Thiamine 100 mg daily for nutritional supplementation -Start Multivitamin daily for nutritional supplementation   -Continue PRN's: Tylenol, Maalox, Atarax, Milk of Magnesia, Trazodone   Dispo: Interested in Residential Rehab   Recommendations  Based on my evaluation the patient does not appear to have an emergency medical condition.  He will be admitted for Detox and medication management.    Lauro Franklin, MD 01/23/24  2:00 PM

## 2024-01-24 DIAGNOSIS — F331 Major depressive disorder, recurrent, moderate: Secondary | ICD-10-CM | POA: Diagnosis not present

## 2024-01-24 DIAGNOSIS — A6002 Herpesviral infection of other male genital organs: Secondary | ICD-10-CM | POA: Diagnosis not present

## 2024-01-24 DIAGNOSIS — F411 Generalized anxiety disorder: Secondary | ICD-10-CM | POA: Diagnosis not present

## 2024-01-24 DIAGNOSIS — F1020 Alcohol dependence, uncomplicated: Secondary | ICD-10-CM | POA: Diagnosis not present

## 2024-01-24 NOTE — ED Notes (Signed)
 Pt sitting in dayroom watching television and interacting with peers. No acute distress noted. No concerns voiced. Informed pt to notify staff with any needs or assistance. Pt verbalized understanding and agreement. Will continue to monitor for safety.

## 2024-01-24 NOTE — ED Notes (Signed)
 Pt is in the dayroom watching TV with peers. Pt denies SI/HI/AVH. Pt has no further complain.No acute distress noted. Will continue to monitor for safety and provide support.

## 2024-01-24 NOTE — ED Provider Notes (Signed)
 Behavioral Health Progress Note  Date and Time: 01/24/2024 8:19 AM Name: Trevor [name] MRN:  [id number]  Subjective:   [name]. Rumore is a 43 yr old male who presented on 3/28 to [facility] requesting help for EtOH Abuse, he was admitted to [facility] on 3/29. PPHx is significant for Depression, Anxiety, EtOH Abuse, and has been to Residential Rehab 4 times, and no history of Suicide Attempts, Self Injurious Behavior, or Psychiatric Hospitalizations. PMHx is significant for Withdrawal Seizures.    He reports that he is doing better today.  He reports he continues to have withdrawal symptoms-sweats and shakes but is no longer having any nausea or headache.  He reports his withdrawal symptoms are lessening.  He reports no symptoms/signs of seizure.  He reports no issues with starting the Zoloft yesterday.  Discussed with him that since he tolerated the starting dose we would continue with the planned increase to 50 mg today and he was agreeable with this.  He reports no SI, HI, or AVH.  He reports his sleep was good last night (best Sleep-Eze had in a long time).  He reports appetite is doing good.  He reports no side effects to his medications.  He reports no other concerns at present.  Diagnosis:  Final diagnoses:  Alcohol abuse  Moderate episode of recurrent major depressive disorder (HCC)  GAD (generalized anxiety disorder)    Total Time spent with patient:  I personally spent 35 minutes on the unit in direct patient care. The direct patient care time included face-to-face time with the patient, reviewing the patient's chart, communicating with other professionals, and coordinating care. Greater than 50% of this time was spent in counseling or coordinating care with the patient regarding goals of hospitalization, psycho-education, and discharge planning needs.   Past Psychiatric History:  Depression, Anxiety, EtOH Abuse, and has been to Residential Rehab 4 times, and no history of Suicide  Attempts, Self Injurious Behavior, or Psychiatric Hospitalizations.    Past Medical History:  Depression, Anxiety, EtOH Abuse, and has been to Residential Rehab 4 times, and no history of Suicide Attempts, Self Injurious Behavior, or Psychiatric Hospitalizations.   Family History:  Mother- Skin Cancer Paternal Grandfather- Colon Cancer Paternal Grandmother and Grandfather- Lung Cancer  Family Psychiatric  History:  Brother- EtOH Abuse Maternal Grandfather and Grandmother- EtOH Abuse No Known Diagnosis' or Suicides  Social History:  Divorced, has a daughter. Recently fired from his job. Started drinking at age 69, longest period of sobriety 6 months, been to Residential Rehab 4 times.   Additional Social History:                         Sleep: Good  Appetite:  Good  Current Medications:  Current Facility-Administered Medications  Medication Dose Route Frequency Provider Last Rate Last Admin   acetaminophen (TYLENOL) tablet 650 mg  650 mg Oral Q6H PRN Marlou Sa, NP       alum & mag hydroxide-simeth (MAALOX/MYLANTA) 200-200-20 MG/5ML suspension 30 mL  30 mL Oral Q4H PRN Rayburn Go, Veronique M, NP       hydrOXYzine (ATARAX) tablet 25 mg  25 mg Oral Q6H PRN Marlou Sa, NP   25 mg at 01/23/24 2122   loperamide (IMODIUM) capsule 2-4 mg  2-4 mg Oral PRN Marlou Sa, NP       LORazepam (ATIVAN) tablet 1 mg  1 mg Oral Q6H PRN Marlou Sa, NP  LORazepam (ATIVAN) tablet 1 mg  1 mg Oral TID Lauro Franklin, MD       Followed by   Melene Muller ON 01/25/2024] LORazepam (ATIVAN) tablet 1 mg  1 mg Oral BID Lauro Franklin, MD       Followed by   Melene Muller ON 01/26/2024] LORazepam (ATIVAN) tablet 1 mg  1 mg Oral Daily Witt Plitt, Mardelle Matte, MD       magnesium hydroxide (MILK OF MAGNESIA) suspension 30 mL  30 mL Oral Daily PRN Marlou Sa, NP       multivitamin with minerals tablet 1 tablet  1 tablet Oral Daily Rayburn Go, Veronique  M, NP       OLANZapine (ZYPREXA) injection 10 mg  10 mg Intramuscular TID PRN Marlou Sa, NP       OLANZapine (ZYPREXA) injection 5 mg  5 mg Intramuscular TID PRN Rayburn Go, Veronique M, NP       OLANZapine zydis (ZYPREXA) disintegrating tablet 5 mg  5 mg Oral TID PRN Rayburn Go, Veronique M, NP       ondansetron (ZOFRAN-ODT) disintegrating tablet 4 mg  4 mg Oral Q6H PRN Lauro Franklin, MD       sertraline (ZOLOFT) tablet 50 mg  50 mg Oral Daily Meika Earll, Mardelle Matte, MD       thiamine (VITAMIN B1) tablet 100 mg  100 mg Oral Daily Byungura, Veronique M, NP       traZODone (DESYREL) tablet 50 mg  50 mg Oral QHS PRN Marlou Sa, NP   50 mg at 01/23/24 2122   Current Outpatient Medications  Medication Sig Dispense Refill   famotidine (PEPCID) 20 MG tablet Take 20 mg by mouth 2 (two) times daily. (Patient not taking: Reported on 01/22/2024)     hydrOXYzine (ATARAX) 50 MG tablet Take 50 mg by mouth every 8 (eight) hours as needed for anxiety. (Patient not taking: Reported on 01/22/2024)     Multiple Vitamin (MULTIVITAMIN WITH MINERALS) TABS tablet Take 1 tablet by mouth daily.     thiamine (VITAMIN B1) 100 MG tablet Take 1 tablet (100 mg total) by mouth daily. (Patient not taking: Reported on 01/22/2024) 30 tablet 0   traZODone (DESYREL) 150 MG tablet Take 150 mg by mouth at bedtime. (Patient not taking: Reported on 01/22/2024)      Labs  Lab Results:  Admission on 01/22/2024, Discharged on 01/23/2024  Component Date Value Ref Range Status   WBC 01/22/2024 6.8  4.0 - 10.5 K/uL Final   RBC 01/22/2024 5.22  4.22 - 5.81 MIL/uL Final   Hemoglobin 01/22/2024 17.2 (H)  13.0 - 17.0 g/dL Final   HCT 16/07/9603 47.8  39.0 - 52.0 % Final   MCV 01/22/2024 91.6  80.0 - 100.0 fL Final   MCH 01/22/2024 33.0  26.0 - 34.0 pg Final   MCHC 01/22/2024 36.0  30.0 - 36.0 g/dL Final   RDW 54/06/8118 12.1  11.5 - 15.5 % Final   Platelets 01/22/2024 319  150 - 400 K/uL Final   nRBC 01/22/2024  0.0  0.0 - 0.2 % Final   Neutrophils Relative % 01/22/2024 70  % Final   Neutro Abs 01/22/2024 4.8  1.7 - 7.7 K/uL Final   Lymphocytes Relative 01/22/2024 25  % Final   Lymphs Abs 01/22/2024 1.7  0.7 - 4.0 K/uL Final   Monocytes Relative 01/22/2024 4  % Final   Monocytes Absolute 01/22/2024 0.3  0.1 - 1.0 K/uL Final   Eosinophils Relative 01/22/2024 0  %  Final   Eosinophils Absolute 01/22/2024 0.0  0.0 - 0.5 K/uL Final   Basophils Relative 01/22/2024 1  % Final   Basophils Absolute 01/22/2024 0.1  0.0 - 0.1 K/uL Final   Immature Granulocytes 01/22/2024 0  % Final   Abs Immature Granulocytes 01/22/2024 0.02  0.00 - 0.07 K/uL Final   Performed at [facility], [address], [place], [place] [address]   Sodium 01/22/2024 138  135 - 145 mmol/L Final   Potassium 01/22/2024 3.9  3.5 - 5.1 mmol/L Final   Chloride 01/22/2024 99  98 - 111 mmol/L Final   CO2 01/22/2024 25  22 - 32 mmol/L Final   Glucose, Bld 01/22/2024 99  70 - 99 mg/dL Final   Glucose reference range applies only to samples taken after fasting for at least 8 hours.   BUN 01/22/2024 13  6 - 20 mg/dL Final   Creatinine, Ser 01/22/2024 0.83  0.61 - 1.24 mg/dL Final   Calcium 60/45/4098 8.9  8.9 - 10.3 mg/dL Final   Total Protein 11/91/4782 7.5  6.5 - 8.1 g/dL Final   Albumin 95/62/1308 4.6  3.5 - 5.0 g/dL Final   AST 65/78/4696 44 (H)  15 - 41 U/L Final   ALT 01/22/2024 46 (H)  0 - 44 U/L Final   Alkaline Phosphatase 01/22/2024 60  38 - 126 U/L Final   Total Bilirubin 01/22/2024 0.8  0.0 - 1.2 mg/dL Final   GFR, Estimated 01/22/2024 >60  >60 mL/min Final   Comment: (NOTE) Calculated using the CKD-EPI Creatinine Equation (2021)    Anion gap 01/22/2024 14  5 - 15 Final   Performed at [facility], [address] [place], [place] [address]   Hgb A1c MFr Bld 01/22/2024 5.0  4.8 - 5.6 % Final   Comment: (NOTE) Pre diabetes:          5.7%-6.4%  Diabetes:              >6.4%  Glycemic control for   <7.0% adults  with diabetes    Mean Plasma Glucose 01/22/2024 96.8  mg/dL Final   Performed at [facility], [address] [place], [place] [address]   Magnesium 01/22/2024 1.9  1.7 - 2.4 mg/dL Final   Performed at [facility], [address] [place], [place] [address]   Alcohol, Ethyl (B) 01/22/2024 109 (H)  <10 mg/dL Final   Comment: (NOTE) Lowest detectable limit for serum alcohol is 10 mg/dL.  For medical purposes only. Performed at [facility], [address] [place], [place] [address]    Cholesterol 01/22/2024 202 (H)  0 - 200 mg/dL Final   Triglycerides 64/40/3474 65  <150 mg/dL Final   HDL 25/95/6387 84  >40 mg/dL Final   Total CHOL/HDL Ratio 01/22/2024 2.4  RATIO Final   VLDL 01/22/2024 13  0 - 40 mg/dL Final   LDL Cholesterol 01/22/2024 105 (H)  0 - 99 mg/dL Final   Comment:        Total Cholesterol/HDL:CHD Risk Coronary Heart Disease Risk Table                     Men   Women  1/2 Average Risk   3.4   3.3  Average Risk       5.0   4.4  2 X Average Risk   9.6   7.1  3 X Average Risk  23.4   11.0  Use the calculated Patient Ratio above and the CHD Risk Table to determine the patient's CHD Risk.        ATP III CLASSIFICATION (LDL):  <100     mg/dL   Optimal  657-846  mg/dL   Near or Above                    Optimal  130-159  mg/dL   Borderline  962-952  mg/dL   High  >841     mg/dL   Very High Performed at [facility], [address], [place], [place] [address]    TSH 01/22/2024 0.458  0.350 - 4.500 uIU/mL Final   Comment: Performed by a 3rd Generation assay with a functional sensitivity of <=0.01 uIU/mL. Performed at [facility], [address], [place], [place] [address]    RPR Ser Ql 01/22/2024 NON REACTIVE  NON REACTIVE Final   Performed at [facility], [address], [place], [place] [address]   Color, Urine 01/22/2024 YELLOW  YELLOW Final   APPearance 01/22/2024 CLEAR  CLEAR Final   Specific Gravity, Urine  01/22/2024 1.020  1.005 - 1.030 Final   pH 01/22/2024 5.0  5.0 - 8.0 Final   Glucose, UA 01/22/2024 NEGATIVE  NEGATIVE mg/dL Final   Hgb urine dipstick 01/22/2024 NEGATIVE  NEGATIVE Final   Bilirubin Urine 01/22/2024 NEGATIVE  NEGATIVE Final   Ketones, ur 01/22/2024 5 (A)  NEGATIVE mg/dL Final   Protein, ur 40/34/7425 NEGATIVE  NEGATIVE mg/dL Final   Nitrite 95/63/8756 NEGATIVE  NEGATIVE Final   Leukocytes,Ua 01/22/2024 NEGATIVE  NEGATIVE Final   Performed at [facility], [address] [place], [place] [address]   POC Amphetamine UR 01/22/2024 None Detected   Final   POC Secobarbital (BAR) 01/22/2024 None Detected   Final   POC Buprenorphine (BUP) 01/22/2024 None Detected   Final   POC Oxazepam (BZO) 01/22/2024 None Detected   Final   POC Cocaine UR 01/22/2024 None Detected   Final   POC Methamphetamine UR 01/22/2024 None Detected   Final   POC Morphine 01/22/2024 None Detected   Final   POC Methadone UR 01/22/2024 None Detected   Final   POC Oxycodone UR 01/22/2024 None Detected   Final   POC Marijuana UR 01/22/2024 None Detected   Final  Admission on 12/24/2023, Discharged on 12/25/2023  Component Date Value Ref Range Status   Sodium 12/24/2023 144  135 - 145 mmol/L Final   Potassium 12/24/2023 3.8  3.5 - 5.1 mmol/L Final   Chloride 12/24/2023 107  98 - 111 mmol/L Final   CO2 12/24/2023 26  22 - 32 mmol/L Final   Glucose, Bld 12/24/2023 96  70 - 99 mg/dL Final   Glucose reference range applies only to samples taken after fasting for at least 8 hours.   BUN 12/24/2023 11  6 - 20 mg/dL Final   Creatinine, Ser 12/24/2023 0.62  0.61 - 1.24 mg/dL Final   Calcium 51/88/4166 8.6 (L)  8.9 - 10.3 mg/dL Final   Total Protein 04/25/1600 7.8  6.5 - 8.1 g/dL Final   Albumin 09/32/3557 4.3  3.5 - 5.0 g/dL Final   AST 32/20/2542 34  15 - 41 U/L Final   ALT 12/24/2023 27  0 - 44 U/L Final   Alkaline Phosphatase 12/24/2023 56  38 - 126 U/L Final   Total Bilirubin 12/24/2023 0.4  0.0 -  1.2 mg/dL Final   GFR, Estimated  12/24/2023 >60  >60 mL/min Final   Comment: (NOTE) Calculated using the CKD-EPI Creatinine Equation (2021)    Anion gap 12/24/2023 11  5 - 15 Final   Performed at [facility], [address], [place], [place] [address]   Alcohol, Ethyl (B) 12/24/2023 436 (HH)  <10 mg/dL Final   Comment: CRITICAL RESULT CALLED TO, READ BACK BY AND VERIFIED WITH Barnett Applebaum, RN 12/25/23 0050 BY K. DAVIS (NOTE) Lowest detectable limit for serum alcohol is 10 mg/dL.  For medical purposes only. Performed at [facility], [address], [place], [place] [address]    Opiates 12/25/2023 NONE DETECTED  NONE DETECTED Final   Cocaine 12/25/2023 NONE DETECTED  NONE DETECTED Final   Benzodiazepines 12/25/2023 NONE DETECTED  NONE DETECTED Final   Amphetamines 12/25/2023 NONE DETECTED  NONE DETECTED Final   Tetrahydrocannabinol 12/25/2023 NONE DETECTED  NONE DETECTED Final   Barbiturates 12/25/2023 NONE DETECTED  NONE DETECTED Final   Comment: (NOTE) DRUG SCREEN FOR MEDICAL PURPOSES ONLY.  IF CONFIRMATION IS NEEDED FOR ANY PURPOSE, NOTIFY LAB WITHIN 5 DAYS.  LOWEST DETECTABLE LIMITS FOR URINE DRUG SCREEN Drug Class                     Cutoff (ng/mL) Amphetamine and metabolites    1000 Barbiturate and metabolites    200 Benzodiazepine                 200 Opiates and metabolites        300 Cocaine and metabolites        300 THC                            50 Performed at [facility], [address] [place], [place] [address]    WBC 12/24/2023 5.1  4.0 - 10.5 K/uL Final   RBC 12/24/2023 4.78  4.22 - 5.81 MIL/uL Final   Hemoglobin 12/24/2023 16.0  13.0 - 17.0 g/dL Final   HCT 62/95/2841 45.7  39.0 - 52.0 % Final   MCV 12/24/2023 95.6  80.0 - 100.0 fL Final   MCH 12/24/2023 33.5  26.0 - 34.0 pg Final   MCHC 12/24/2023 35.0  30.0 - 36.0 g/dL Final   RDW 32/44/0102 12.8  11.5 - 15.5 % Final   Platelets 12/24/2023 292   150 - 400 K/uL Final   nRBC 12/24/2023 0.0  0.0 - 0.2 % Final   Neutrophils Relative % 12/24/2023 61  % Final   Neutro Abs 12/24/2023 3.0  1.7 - 7.7 K/uL Final   Lymphocytes Relative 12/24/2023 31  % Final   Lymphs Abs 12/24/2023 1.6  0.7 - 4.0 K/uL Final   Monocytes Relative 12/24/2023 8  % Final   Monocytes Absolute 12/24/2023 0.4  0.1 - 1.0 K/uL Final   Eosinophils Relative 12/24/2023 0  % Final   Eosinophils Absolute 12/24/2023 0.0  0.0 - 0.5 K/uL Final   Basophils Relative 12/24/2023 0  % Final   Basophils Absolute 12/24/2023 0.0  0.0 - 0.1 K/uL Final   Immature Granulocytes 12/24/2023 0  % Final   Abs Immature Granulocytes 12/24/2023 0.02  0.00 - 0.07 K/uL Final   Performed at [facility], [address] [place], [place] [address]   Lipase 12/24/2023 41  11 - 51 U/L Final   Performed at [facility], [address] [place], [place] [address]  Color, Urine 12/25/2023 YELLOW  YELLOW Final   APPearance 12/25/2023 HAZY (A)  CLEAR Final   Specific Gravity, Urine 12/25/2023 1.010  1.005 - 1.030 Final   pH 12/25/2023 7.0  5.0 - 8.0 Final   Glucose, UA 12/25/2023 NEGATIVE  NEGATIVE mg/dL Final   Hgb urine dipstick 12/25/2023 NEGATIVE  NEGATIVE Final   Bilirubin Urine 12/25/2023 NEGATIVE  NEGATIVE Final   Ketones, ur 12/25/2023 NEGATIVE  NEGATIVE mg/dL Final   Protein, ur 40/98/1191 NEGATIVE  NEGATIVE mg/dL Final   Nitrite 47/82/9562 NEGATIVE  NEGATIVE Final   Leukocytes,Ua 12/25/2023 NEGATIVE  NEGATIVE Final   Performed at [facility], [address], [place], [place] [address]  Admission on 09/23/2023, Discharged on 09/29/2023  Component Date Value Ref Range Status   WBC 09/23/2023 5.2  4.0 - 10.5 K/uL Final   RBC 09/23/2023 5.10  4.22 - 5.81 MIL/uL Final   Hemoglobin 09/23/2023 16.9  13.0 - 17.0 g/dL Final   HCT 57/84/6962 46.5  39.0 - 52.0 % Final   MCV 09/23/2023 91.2  80.0 - 100.0 fL Final   MCH 09/23/2023 33.1  26.0  - 34.0 pg Final   MCHC 09/23/2023 36.3 (H)  30.0 - 36.0 g/dL Final   RDW 95/28/4132 12.9  11.5 - 15.5 % Final   Platelets 09/23/2023 185  150 - 400 K/uL Final   nRBC 09/23/2023 0.0  0.0 - 0.2 % Final   Performed at [facility], [address], [place], [place] [address]   Sodium 09/23/2023 139  135 - 145 mmol/L Final   Potassium 09/23/2023 3.5  3.5 - 5.1 mmol/L Final   Chloride 09/23/2023 99  98 - 111 mmol/L Final   CO2 09/23/2023 22  22 - 32 mmol/L Final   Glucose, Bld 09/23/2023 169 (H)  70 - 99 mg/dL Final   Glucose reference range applies only to samples taken after fasting for at least 8 hours.   BUN 09/23/2023 6  6 - 20 mg/dL Final   Creatinine, Ser 09/23/2023 0.71  0.61 - 1.24 mg/dL Final   Calcium 27/25/3664 8.4 (L)  8.9 - 10.3 mg/dL Final   Total Protein 40/34/7425 6.9  6.5 - 8.1 g/dL Final   Albumin 95/63/8756 4.0  3.5 - 5.0 g/dL Final   AST 43/32/9518 135 (H)  15 - 41 U/L Final   ALT 09/23/2023 76 (H)  0 - 44 U/L Final   Alkaline Phosphatase 09/23/2023 58  38 - 126 U/L Final   Total Bilirubin 09/23/2023 0.9  <1.2 mg/dL Final   GFR, Estimated 09/23/2023 >60  >60 mL/min Final   Comment: (NOTE) Calculated using the CKD-EPI Creatinine Equation (2021)    Anion gap 09/23/2023 18 (H)  5 - 15 Final   Performed at [facility], [address], [place], [place] [address]   Troponin I (High Sensitivity) 09/23/2023 14  <18 ng/L Final   Comment: (NOTE) Elevated high sensitivity troponin I (hsTnI) values and significant  changes across serial measurements may suggest ACS but many other  chronic and acute conditions are known to elevate hsTnI results.  Refer to the "Links" section for chest pain algorithms and additional  guidance. Performed at [facility], [address] [place], [place] [address]    Alcohol, Ethyl (B) 09/23/2023 412 (HH)  <10 mg/dL Final   Comment: CRITICAL RESULT CALLED TO, READ BACK BY AND VERIFIED WITH B. OSORIO, RN AT [phone] 11.27.24  JLASIGAN (NOTE) Lowest detectable limit for serum  alcohol is 10 mg/dL.  For medical purposes only. Performed at [facility], [address] [place], [place] [address]    Lipase 09/23/2023 86 (H)  11 - 51 U/L Final   Performed at [facility], [address] [place], [place] [address]   Troponin I (High Sensitivity) 09/23/2023 19 (H)  <18 ng/L Final   Comment: (NOTE) Elevated high sensitivity troponin I (hsTnI) values and significant  changes across serial measurements may suggest ACS but many other  chronic and acute conditions are known to elevate hsTnI results.  Refer to the "Links" section for chest pain algorithms and additional  guidance. Performed at [facility], [address] [place], [place] [address]    Magnesium 09/23/2023 1.8  1.7 - 2.4 mg/dL Final   Performed at [facility], [address] [place], [place] [address]   Phosphorus 09/23/2023 2.2 (L)  2.5 - 4.6 mg/dL Final   Performed at [facility], [address] [place], [place] [address]   Opiates 09/23/2023 NONE DETECTED  NONE DETECTED Final   Cocaine 09/23/2023 NONE DETECTED  NONE DETECTED Final   Benzodiazepines 09/23/2023 NONE DETECTED  NONE DETECTED Final   Amphetamines 09/23/2023 NONE DETECTED  NONE DETECTED Final   Tetrahydrocannabinol 09/23/2023 NONE DETECTED  NONE DETECTED Final   Barbiturates 09/23/2023 NONE DETECTED  NONE DETECTED Final   Comment: (NOTE) DRUG SCREEN FOR MEDICAL PURPOSES ONLY.  IF CONFIRMATION IS NEEDED FOR ANY PURPOSE, NOTIFY LAB WITHIN 5 DAYS.  LOWEST DETECTABLE LIMITS FOR URINE DRUG SCREEN Drug Class                     Cutoff (ng/mL) Amphetamine and metabolites    1000 Barbiturate and metabolites    200 Benzodiazepine                 200 Opiates and metabolites        300 Cocaine and metabolites        300 THC                            50 Performed at [facility], [address], [place], [place] [address]    HIV Screen 4th  Generation wRfx 09/23/2023 Non Reactive  Non Reactive Final   Performed at [facility], [address] [place], [place] [address]   Phosphorus 09/24/2023 3.1  2.5 - 4.6 mg/dL Final   Performed at [facility], [address] [place], [place] [address]   Lipase 09/24/2023 92 (H)  11 - 51 U/L Final   Performed at [facility], [address], [place], [place] [address]   Sodium 09/24/2023 130 (L)  135 - 145 mmol/L Final   DELTA CHECK NOTED   Potassium 09/24/2023 3.8  3.5 - 5.1 mmol/L Final   Chloride 09/24/2023 98  98 - 111 mmol/L Final   CO2 09/24/2023 24  22 - 32 mmol/L Final   Glucose, Bld 09/24/2023 94  70 - 99 mg/dL Final   Glucose reference range applies only to samples taken after fasting for at least 8 hours.   BUN 09/24/2023 9  6 - 20 mg/dL Final   Creatinine, Ser 09/24/2023 0.63  0.61 - 1.24 mg/dL Final   Calcium 74/25/9563 8.5 (L)  8.9 - 10.3 mg/dL Final   Total Protein 87/56/4332 5.6 (L)  6.5 - 8.1 g/dL  Final   Albumin 09/24/2023 3.2 (L)  3.5 - 5.0 g/dL Final   AST 40/98/1191 78 (H)  15 - 41 U/L Final   ALT 09/24/2023 54 (H)  0 - 44 U/L Final   Alkaline Phosphatase 09/24/2023 42  38 - 126 U/L Final   Total Bilirubin 09/24/2023 1.2 (H)  <1.2 mg/dL Final   GFR, Estimated 09/24/2023 >60  >60 mL/min Final   Comment: (NOTE) Calculated using the CKD-EPI Creatinine Equation (2021)    Anion gap 09/24/2023 8  5 - 15 Final   Performed at [facility], [address], [place], [place] [address]   WBC 09/24/2023 4.1  4.0 - 10.5 K/uL Final   RBC 09/24/2023 3.97 (L)  4.22 - 5.81 MIL/uL Final   Hemoglobin 09/24/2023 13.2  13.0 - 17.0 g/dL Final   HCT 56/21/3086 37.2 (L)  39.0 - 52.0 % Final   MCV 09/24/2023 93.7  80.0 - 100.0 fL Final   MCH 09/24/2023 33.2  26.0 - 34.0 pg Final   MCHC 09/24/2023 35.5  30.0 - 36.0 g/dL Final   RDW 57/84/6962 12.8  11.5 - 15.5 % Final   Platelets 09/24/2023 130 (L)  150 - 400 K/uL Final   nRBC 09/24/2023 0.0  0.0 - 0.2 % Final    Neutrophils Relative % 09/24/2023 60  % Final   Neutro Abs 09/24/2023 2.5  1.7 - 7.7 K/uL Final   Lymphocytes Relative 09/24/2023 27  % Final   Lymphs Abs 09/24/2023 1.1  0.7 - 4.0 K/uL Final   Monocytes Relative 09/24/2023 11  % Final   Monocytes Absolute 09/24/2023 0.4  0.1 - 1.0 K/uL Final   Eosinophils Relative 09/24/2023 1  % Final   Eosinophils Absolute 09/24/2023 0.0  0.0 - 0.5 K/uL Final   Basophils Relative 09/24/2023 1  % Final   Basophils Absolute 09/24/2023 0.0  0.0 - 0.1 K/uL Final   Immature Granulocytes 09/24/2023 0  % Final   Abs Immature Granulocytes 09/24/2023 0.01  0.00 - 0.07 K/uL Final   Performed at [facility], [address], [place], [place] [address]   Magnesium 09/24/2023 1.6 (L)  1.7 - 2.4 mg/dL Final   Performed at [facility], [address], [place], [place] [address]   WBC 09/25/2023 5.1  4.0 - 10.5 K/uL Final   RBC 09/25/2023 4.20 (L)  4.22 - 5.81 MIL/uL Final   Hemoglobin 09/25/2023 13.5  13.0 - 17.0 g/dL Final   HCT 10/29/7251 40.0  39.0 - 52.0 % Final   MCV 09/25/2023 95.2  80.0 - 100.0 fL Final   MCH 09/25/2023 32.1  26.0 - 34.0 pg Final   MCHC 09/25/2023 33.8  30.0 - 36.0 g/dL Final   RDW 66/44/0347 13.1  11.5 - 15.5 % Final   Platelets 09/25/2023 138 (L)  150 - 400 K/uL Final   REPEATED TO VERIFY   nRBC 09/25/2023 0.0  0.0 - 0.2 % Final   Performed at [facility], [address] [place], [place] [address]   Sodium 09/25/2023 134 (L)  135 - 145 mmol/L Final   Potassium 09/25/2023 3.9  3.5 - 5.1 mmol/L Final   Chloride 09/25/2023 102  98 - 111 mmol/L Final   CO2 09/25/2023 22  22 - 32 mmol/L Final   Glucose, Bld 09/25/2023 92  70 - 99 mg/dL Final   Glucose reference range applies only to samples taken after fasting for at least 8 hours.  BUN 09/25/2023 12  6 - 20 mg/dL Final   Creatinine, Ser 09/25/2023 0.61  0.61 - 1.24 mg/dL Final   Calcium 16/07/9603 9.0  8.9 - 10.3 mg/dL Final   GFR, Estimated 09/25/2023 >60  >60  mL/min Final   Comment: (NOTE) Calculated using the CKD-EPI Creatinine Equation (2021)    Anion gap 09/25/2023 10  5 - 15 Final   Performed at [facility], [address] [place], [place] [address]   Phosphorus 09/25/2023 5.5 (H)  2.5 - 4.6 mg/dL Final   Performed at [facility], [address] [place], [place] [address]   Magnesium 09/25/2023 2.2  1.7 - 2.4 mg/dL Final   Performed at [facility], [address] [place], [place] [address]   Glucose-Capillary 09/25/2023 88  70 - 99 mg/dL Final   Glucose reference range applies only to samples taken after fasting for at least 8 hours.   Glucose-Capillary 09/25/2023 101 (H)  70 - 99 mg/dL Final   Glucose reference range applies only to samples taken after fasting for at least 8 hours.   Glucose-Capillary 09/25/2023 109 (H)  70 - 99 mg/dL Final   Glucose reference range applies only to samples taken after fasting for at least 8 hours.   WBC 09/26/2023 5.2  4.0 - 10.5 K/uL Final   RBC 09/26/2023 4.26  4.22 - 5.81 MIL/uL Final   Hemoglobin 09/26/2023 14.0  13.0 - 17.0 g/dL Final   HCT 62/13/0865 40.3  39.0 - 52.0 % Final   MCV 09/26/2023 94.6  80.0 - 100.0 fL Final   MCH 09/26/2023 32.9  26.0 - 34.0 pg Final   MCHC 09/26/2023 34.7  30.0 - 36.0 g/dL Final   RDW 78/46/9629 13.1  11.5 - 15.5 % Final   Platelets 09/26/2023 143 (L)  150 - 400 K/uL Final   nRBC 09/26/2023 0.0  0.0 - 0.2 % Final   Performed at [facility], [address], [place], [place] [address]   Sodium 09/26/2023 134 (L)  135 - 145 mmol/L Final   Potassium 09/26/2023 4.4  3.5 - 5.1 mmol/L Final   Chloride 09/26/2023 106  98 - 111 mmol/L Final   CO2 09/26/2023 21 (L)  22 - 32 mmol/L Final   Glucose, Bld 09/26/2023 104 (H)  70 - 99 mg/dL Final   Glucose reference range applies only to samples taken after fasting for at least 8 hours.   BUN 09/26/2023 19  6 - 20 mg/dL Final   Creatinine, Ser 09/26/2023 0.60 (L)  0.61 - 1.24 mg/dL Final   Calcium  32/44/0102 8.6 (L)  8.9 - 10.3 mg/dL Final   GFR, Estimated 09/26/2023 >60  >60 mL/min Final   Comment: (NOTE) Calculated using the CKD-EPI Creatinine Equation (2021)    Anion gap 09/26/2023 7  5 - 15 Final   Performed at [facility], [address], [place], [place] [address]   Phosphorus 09/26/2023 3.9  2.5 - 4.6 mg/dL Final   Performed at [facility], [address], [place], [place] [address]   Magnesium 09/26/2023 2.1  1.7 - 2.4 mg/dL Final   Performed at [facility], [address] [place], [place] [address]   WBC 09/27/2023 5.4  4.0 - 10.5 K/uL Final   RBC 09/27/2023 4.42  4.22 - 5.81 MIL/uL Final   Hemoglobin 09/27/2023 14.5  13.0 - 17.0 g/dL Final   HCT 95/63/8756 42.1  39.0 -  52.0 % Final   MCV 09/27/2023 95.2  80.0 - 100.0 fL Final   MCH 09/27/2023 32.8  26.0 - 34.0 pg Final   MCHC 09/27/2023 34.4  30.0 - 36.0 g/dL Final   RDW 19/14/7829 13.0  11.5 - 15.5 % Final   Platelets 09/27/2023 163  150 - 400 K/uL Final   nRBC 09/27/2023 0.0  0.0 - 0.2 % Final   Performed at [facility], [address], [place], [place] [address]   Sodium 09/27/2023 136  135 - 145 mmol/L Final   Potassium 09/27/2023 4.4  3.5 - 5.1 mmol/L Final   Chloride 09/27/2023 104  98 - 111 mmol/L Final   CO2 09/27/2023 25  22 - 32 mmol/L Final   Glucose, Bld 09/27/2023 96  70 - 99 mg/dL Final   Glucose reference range applies only to samples taken after fasting for at least 8 hours.   BUN 09/27/2023 11  6 - 20 mg/dL Final   Creatinine, Ser 09/27/2023 0.75  0.61 - 1.24 mg/dL Final   Calcium 08/65/7846 9.3  8.9 - 10.3 mg/dL Final   GFR, Estimated 09/27/2023 >60  >60 mL/min Final   Comment: (NOTE) Calculated using the CKD-EPI Creatinine Equation (2021)    Anion gap 09/27/2023 7  5 - 15 Final   Performed at [facility], [address], [place], [place] [address]   Phosphorus 09/27/2023 4.5  2.5 - 4.6 mg/dL Final   Performed at [facility], [address]  [place], [place] [address]   Magnesium 09/27/2023 2.2  1.7 - 2.4 mg/dL Final   Performed at [facility], [address], [place], [place] [address]   Glucose-Capillary 09/26/2023 98  70 - 99 mg/dL Final   Glucose reference range applies only to samples taken after fasting for at least 8 hours.   WBC 09/28/2023 5.6  4.0 - 10.5 K/uL Final   RBC 09/28/2023 4.35  4.22 - 5.81 MIL/uL Final   Hemoglobin 09/28/2023 14.5  13.0 - 17.0 g/dL Final   HCT 02/72/5366 42.1  39.0 - 52.0 % Final   MCV 09/28/2023 96.8  80.0 - 100.0 fL Final   MCH 09/28/2023 33.3  26.0 - 34.0 pg Final   MCHC 09/28/2023 34.4  30.0 - 36.0 g/dL Final   RDW 44/12/4740 12.9  11.5 - 15.5 % Final   Platelets 09/28/2023 182  150 - 400 K/uL Final   nRBC 09/28/2023 0.0  0.0 - 0.2 % Final   Performed at [facility], [address], [place], [place] [address]   Sodium 09/28/2023 135  135 - 145 mmol/L Final   Potassium 09/28/2023 4.2  3.5 - 5.1 mmol/L Final   Chloride 09/28/2023 104  98 - 111 mmol/L Final   CO2 09/28/2023 24  22 - 32 mmol/L Final   Glucose, Bld 09/28/2023 102 (H)  70 - 99 mg/dL Final   Glucose reference range applies only to samples taken after fasting for at least 8 hours.   BUN 09/28/2023 16  6 - 20 mg/dL Final   Creatinine, Ser 09/28/2023 0.75  0.61 - 1.24 mg/dL Final   Calcium 87/56/4332 8.8 (L)  8.9 - 10.3 mg/dL Final   GFR, Estimated 09/28/2023 >60  >60 mL/min Final   Comment: (NOTE) Calculated using the CKD-EPI Creatinine Equation (2021)    Anion gap 09/28/2023 7  5 - 15 Final   Performed at [facility], [address], [place],   [address]   Phosphorus 09/28/2023 3.9  2.5 - 4.6 mg/dL Final   Performed at [facility], [address], [place], [place] [address]   Magnesium 09/28/2023 1.9  1.7 - 2.4 mg/dL Final   Performed at [facility], [address], [place], [place] [address]   Total Protein 09/28/2023 6.6  6.5 - 8.1 g/dL Final   Albumin 29/52/8413 3.7  3.5 - 5.0 g/dL  Final   AST 24/40/1027 28  15 - 41 U/L Final   ALT 09/28/2023 38  0 - 44 U/L Final   Alkaline Phosphatase 09/28/2023 40  38 - 126 U/L Final   Total Bilirubin 09/28/2023 0.7  <1.2 mg/dL Final   Bilirubin, Direct 09/28/2023 0.1  0.0 - 0.2 mg/dL Final   Indirect Bilirubin 09/28/2023 0.6  0.3 - 0.9 mg/dL Final   Performed at [facility], [address], [place], [place] [address]   Lipase 09/28/2023 84 (H)  11 - 51 U/L Final   Performed at [facility], [address] [place], [place] [address]    Blood Alcohol level:  Lab Results  Component Value Date   ETH 109 (H) 01/22/2024   ETH 436 (HH) 12/24/2023    Metabolic Disorder Labs: Lab Results  Component Value Date   HGBA1C 5.0 01/22/2024   MPG 96.8 01/22/2024   MPG 108 11/07/2014   No results found for: "PROLACTIN" Lab Results  Component Value Date   CHOL 202 (H) 01/22/2024   TRIG 65 01/22/2024   HDL 84 01/22/2024   CHOLHDL 2.4 01/22/2024   VLDL 13 01/22/2024   LDLCALC 105 (H) 01/22/2024   LDLCALC 137 (H) 01/09/2016    Therapeutic Lab Levels: No results found for: "LITHIUM" No results found for: "VALPROATE" No results found for: "CBMZ"  Physical Findings   AUDIT    Flowsheet Row ED from 01/23/2024 in [facility]  Alcohol Use Disorder Identification Test Final Score (AUDIT) 36      GAD-7    Flowsheet Row Counselor from 10/22/2023 in [facility]  Total GAD-7 Score 8      PHQ2-9    Flowsheet Row ED from 01/23/2024 in [facility] ED from 01/22/2024 in [facility] Counselor from 10/22/2023 in [facility] Office Visit from 08/22/2016 in [facility] Visit from 01/09/2016 in [facility] at [facility]  PHQ-2 Total Score 3 4 0 0 0  PHQ-9 Total Score 14 12 -- -- --      Flowsheet Row  ED from 01/23/2024 in [facility] ED from 01/22/2024 in [facility] ED from 12/24/2023 in [facility] Emergency Department at [facility]  C-SSRS RISK CATEGORY Low Risk Error: Q3, 4, or 5 should not be populated when Q2 is No No Risk        Musculoskeletal  Strength & Muscle Tone: within normal limits Gait & Station: normal Patient leans: N/A  Psychiatric Specialty Exam  Presentation  General Appearance:  Appropriate for Environment; Casual  Eye Contact: Good  Speech: Clear and Coherent; Normal Rate  Speech Volume: Normal  Handedness: Right   Mood and Affect  Mood: Dysphoric  Affect: Congruent   Thought Process  Thought Processes: Coherent; Goal Directed  Descriptions of Associations:Intact  Orientation:Full (Time, Place and Person)  Thought Content:WDL; Logical  Diagnosis  of Schizophrenia or Schizoaffective disorder in past: No    Hallucinations:Hallucinations: None  Ideas of Reference:None  Suicidal Thoughts:Suicidal Thoughts: No  Homicidal Thoughts:Homicidal Thoughts: No   Sensorium  Memory: Immediate Fair; Recent Fair  Judgment: Fair  Insight: Fair   Art therapist  Concentration: Good  Attention Span: Good  Recall: Good  Fund of Knowledge: Good  Language: Good   Psychomotor Activity  Psychomotor Activity: Psychomotor Activity: Normal   Assets  Assets: Communication Skills; Desire for Improvement; Physical Health; Resilience   Sleep  Sleep: Sleep: Good   Nutritional Assessment (For OBS and FBC admissions only) Has the patient had a weight loss or gain of 10 pounds or more in the last 3 months?: Yes Has the patient had a decrease in food intake/or appetite?: Yes Does the patient have dental problems?: Yes Does the patient have eating habits or behaviors that may be indicators of an eating disorder including binging or inducing vomiting?:  No Has the patient recently lost weight without trying?: 1 Has the patient been eating poorly because of a decreased appetite?: 1 Malnutrition Screening Tool Score: 2    Physical Exam  Physical Exam Vitals and nursing note reviewed.  Constitutional:      General: He is not in acute distress.    Appearance: Normal appearance. He is normal weight. He is not ill-appearing or toxic-appearing.  HENT:     Head: Normocephalic and atraumatic.  Pulmonary:     Effort: Pulmonary effort is normal.  Musculoskeletal:        General: Normal range of motion.  Neurological:     General: No focal deficit present.     Mental Status: He is alert.    Review of Systems  Constitutional:  Positive for diaphoresis.  Respiratory:  Negative for cough and shortness of breath.   Cardiovascular:  Negative for chest pain.  Gastrointestinal:  Negative for abdominal pain, constipation, diarrhea, nausea and vomiting.  Neurological:  Positive for tremors. Negative for dizziness, weakness and headaches.  Psychiatric/Behavioral:  Positive for depression. Negative for hallucinations and suicidal ideas. The patient is not nervous/anxious.    Blood pressure 107/61, pulse (!) 56, temperature 97.7 F (36.5 C), temperature source Oral, resp. rate 17, SpO2 100%. There is no height or weight on file to calculate BMI.  Treatment Plan Summary: Daily contact with patient to assess and evaluate symptoms and progress in treatment and Medication management  [name]. Pocock is a 43 yr old male who presented on 3/28 to [facility] requesting help for EtOH Abuse, he was admitted to [facility] on 3/29.  PPHx is significant for Depression, Anxiety, EtOH Abuse, and has been to Residential Rehab 4 times, and no history of Suicide Attempts, Self Injurious Behavior, or Psychiatric Hospitalizations.  PMHx is significant for Withdrawal Seizures.    Ruford is continuing to have withdrawal symptoms-sweats and shakes but is having no signs/symptoms  of seizure.  Given his history of withdrawal seizures we will continue with the scheduled Ativan taper to end on 4/2.  He tolerated the starting dose of Zoloft yesterday so we will continue with the plan to increase to 50 mg today.  He continues to be interested in residential rehab and appreciate social work's assistance with this.  We will not make any other changes to his medications at this time.  We will continue to monitor.   MDD, Recurrent, Moderate  GAD: -Start Zoloft 25 mg and increase to 50 mg tomorrow for depression and anxiety. -Continue Agitation Protocol: Zyprexa  Withdrawal: -Continue CIWA, last score= 0  @ 2956  3/30 -Continue Ativan taper to end 4/2 -Continue Ativan 1 mg q6 PRN CIWA>10 -Continue Imodium 2-4 mg PRN diarrhea -Continue Zofran-ODT 4 mg q6 PRN nausea -Continue Thiamine 100 mg daily for nutritional supplementation -Continue Multivitamin daily for nutritional supplementation      -Continue PRN's: Tylenol, Maalox, Atarax, Milk of Magnesia, Trazodone     Dispo: Interested in Residential Rehab   Lauro Franklin, MD 01/24/2024 8:19 AM

## 2024-01-24 NOTE — ED Notes (Signed)
 Patient is sleeping. Respirations equal and unlabored, skin warm and dry. No change in assessment or acuity. Routine safety checks conducted according to facility protocol. Will continue to monitor for safety.

## 2024-01-24 NOTE — Group Note (Signed)
 Group Topic: Healthy Self Image and Positive Change  Group Date: 01/24/2024 Start Time: 1930 End Time: 2100 Facilitators: Gigi Gin, NT  Department: [facility]  Number of Participants: 8  Group Focus: abuse issues, acceptance, activities of daily living skills, chemical dependency issues, co-dependency, communication, feeling awareness/expression, goals/reality orientation, healthy friendships, individual meeting, personal responsibility, problem solving, relapse prevention, self-esteem, social skills, and substance abuse education Treatment Modality:  Exposure Therapy, Interpersonal Therapy, Leisure Development, and Psychoeducation Interventions utilized were group exercise, leisure development, patient education, problem solving, story telling, and support Purpose: enhance coping skills, express feelings, express irrational fears, increase insight, reinforce self-care, relapse prevention strategies, and trigger / craving management  Name: Trevor [name] Date of Birth: 1980/12/07  MR: [id number]    Level of Participation: active Quality of Participation: attentive, cooperative, and supportive Interactions with others: gave feedback Mood/Affect: appropriate and positive Triggers (if applicable): NA Cognition: coherent/clear and goal directed Progress: Moderate Response: NA Plan: patient will be encouraged to attend group  Patients Problems:  Patient Active Problem List   Diagnosis Date Noted   Alcohol use disorder 01/23/2024   Hyponatremia 06/19/2022   Transaminitis 06/18/2022   GERD (gastroesophageal reflux disease) 06/18/2022   Hypokalemia 06/18/2022   Loss of consciousness (HCC) 06/18/2022   QT prolongation 06/18/2022   Chronic hepatitis C without hepatic coma (HCC) 06/28/2019   Alcohol abuse 06/28/2019   Psoriasis 06/07/2019   Suicidal ideation 06/07/2019   Withdrawal symptoms, alcohol, with delirium (HCC) 06/07/2019   Alcoholic  hepatitis without ascites 06/05/2019   Alcohol withdrawal (HCC) 06/03/2019   Obesity (BMI 30-39.9) 01/09/2016   HSV-2 (herpes simplex virus 2) infection

## 2024-01-24 NOTE — ED Notes (Signed)
 Pt outside in courtyard interacting with peers. No acute distress noted. No concerns voiced. Informed pt to notify staff with any needs or assistance. Pt verbalized understanding and agreement. Will continue to monitor for safety.

## 2024-01-24 NOTE — Group Note (Signed)
 Group Topic: Communication  Group Date: 01/24/2024 Start Time: 0900 End Time: 0945 Facilitators: Prentice Docker, RN  Department: [facility]  Number of Participants: 7  Group Focus: communication Treatment Modality:  Individual Therapy Interventions utilized were other medication education Purpose: increase insight  Name: Trevor [name] Date of Birth: 13-Apr-1981  MR: [id number]    Level of Participation: active Quality of Participation: attentive Interactions with others: gave feedback Mood/Affect: appropriate Triggers (if applicable): none identified Cognition: coherent/clear Progress: Gaining insight Response: pt verbalized understanding of indication of prescribed medications Plan: patient will be encouraged to notify staff with any needs or concerns pertaining to medications  Patients Problems:  Patient Active Problem List   Diagnosis Date Noted   Alcohol use disorder 01/23/2024   Hyponatremia 06/19/2022   Transaminitis 06/18/2022   GERD (gastroesophageal reflux disease) 06/18/2022   Hypokalemia 06/18/2022   Loss of consciousness (HCC) 06/18/2022   QT prolongation 06/18/2022   Chronic hepatitis C without hepatic coma (HCC) 06/28/2019   Alcohol abuse 06/28/2019   Psoriasis 06/07/2019   Suicidal ideation 06/07/2019   Withdrawal symptoms, alcohol, with delirium (HCC) 06/07/2019   Alcoholic hepatitis without ascites 06/05/2019   Alcohol withdrawal (HCC) 06/03/2019   Obesity (BMI 30-39.9) 01/09/2016   HSV-2 (herpes simplex virus 2) infection

## 2024-01-24 NOTE — Group Note (Signed)
 Group Topic: Relaxation  Group Date: 01/24/2024 Start Time: 1100 End Time: 1130 Facilitators: Merlene Morse, NT  Department: [facility]  Number of Participants: 10  Group Focus: daily focus Treatment Modality:  Leisure Development Interventions utilized were leisure development Purpose: reinforce self-care  Name: Trevor [name] Date of Birth: November 16, 1980  MR: [id number]    Level of Participation: active Quality of Participation: cooperative Interactions with others: gave feedback Mood/Affect: appropriate Triggers (if applicable):  Cognition: coherent/clear Progress: Gaining insight Response:  Plan: patient will be encouraged to continue to attend groups.   Patients Problems:  Patient Active Problem List   Diagnosis Date Noted   Alcohol use disorder 01/23/2024   Hyponatremia 06/19/2022   Transaminitis 06/18/2022   GERD (gastroesophageal reflux disease) 06/18/2022   Hypokalemia 06/18/2022   Loss of consciousness (HCC) 06/18/2022   QT prolongation 06/18/2022   Chronic hepatitis C without hepatic coma (HCC) 06/28/2019   Alcohol abuse 06/28/2019   Psoriasis 06/07/2019   Suicidal ideation 06/07/2019   Withdrawal symptoms, alcohol, with delirium (HCC) 06/07/2019   Alcoholic hepatitis without ascites 06/05/2019   Alcohol withdrawal (HCC) 06/03/2019   Obesity (BMI 30-39.9) 01/09/2016   HSV-2 (herpes simplex virus 2) infection

## 2024-01-24 NOTE — ED Notes (Signed)
 Patient A&Ox4. Denies intent to harm self/others when asked. Denies A/VH. Patient denies any physical complaints when asked. No acute distress noted. Pt reports no withdrawal sx. Support and encouragement provided. Routine safety checks conducted according to facility protocol. Encouraged patient to notify staff if thoughts of harm toward self or others arise. Patient verbalize understanding and agreement. Will continue to monitor for safety.

## 2024-01-24 NOTE — ED Notes (Signed)
 Patient is the bedroom sleeping with eyes closed. NAD Respirations are even and unlabored. Will monitor for safety.

## 2024-01-25 DIAGNOSIS — F411 Generalized anxiety disorder: Secondary | ICD-10-CM | POA: Diagnosis not present

## 2024-01-25 DIAGNOSIS — F331 Major depressive disorder, recurrent, moderate: Secondary | ICD-10-CM | POA: Diagnosis not present

## 2024-01-25 DIAGNOSIS — A6002 Herpesviral infection of other male genital organs: Secondary | ICD-10-CM | POA: Diagnosis not present

## 2024-01-25 DIAGNOSIS — F1020 Alcohol dependence, uncomplicated: Secondary | ICD-10-CM | POA: Diagnosis not present

## 2024-01-25 MED ORDER — GABAPENTIN 100 MG PO CAPS
200.0000 mg | ORAL_CAPSULE | Freq: Three times a day (TID) | ORAL | Status: DC
  Administered 2024-01-25 – 2024-02-02 (×24): 200 mg via ORAL
  Filled 2024-01-25 (×13): qty 2
  Filled 2024-01-25: qty 84
  Filled 2024-01-25 (×11): qty 2

## 2024-01-25 MED ORDER — HYDROXYZINE HCL 25 MG PO TABS
25.0000 mg | ORAL_TABLET | Freq: Four times a day (QID) | ORAL | Status: DC | PRN
Start: 2024-01-25 — End: 2024-02-02
  Administered 2024-01-25 – 2024-02-01 (×13): 25 mg via ORAL
  Filled 2024-01-25: qty 1
  Filled 2024-01-25: qty 20
  Filled 2024-01-25 (×11): qty 1

## 2024-01-25 NOTE — ED Notes (Signed)
 Pt sitting in dayroom watching television and interacting with peers. No acute distress noted. No concerns voiced. Informed pt to notify staff with any needs or assistance. Pt verbalized understanding and agreement. Will continue to monitor for safety.

## 2024-01-25 NOTE — ED Notes (Signed)
 Pt complained of increased anxiety and mild tremors. Tremors observed. Pt received Neurontin and Vistaril. Writer provided education on new medication, Neurontin. Pt verbalized understanding of indication of medication and SE to monitor for. Will continue to monitor for safety.

## 2024-01-25 NOTE — Group Note (Signed)
 Group Topic: Positive Affirmations  Group Date: 01/25/2024 Start Time: 1730 End Time: 1745 Facilitators: Vonzell Schlatter B  Department: [facility]  Number of Participants: 3  Group Focus: affirmation and daily focus Treatment Modality:  Psychoeducation Interventions utilized were problem solving and support Purpose: express feelings and reinforce self-care  Name: Trevor [name] Date of Birth: [birth year]  MR: [id number]    Level of Participation: active Quality of Participation: attentive and cooperative Interactions with others: gave feedback Mood/Affect: positive Triggers (if applicable): N/A Cognition: coherent/clear Progress: Moderate Response: Pt is on the right track and ready to get the help he needs and looking forward to his next chapter at the inpatient facility Plan: follow-up needed  Patients Problems:  Patient Active Problem List   Diagnosis Date Noted   Alcohol use disorder 01/23/2024   Hyponatremia 06/19/2022   Transaminitis 06/18/2022   GERD (gastroesophageal reflux disease) 06/18/2022   Hypokalemia 06/18/2022   Loss of consciousness (HCC) 06/18/2022   QT prolongation 06/18/2022   Chronic hepatitis C without hepatic coma (HCC) 06/28/2019   Alcohol abuse 06/28/2019   Psoriasis 06/07/2019   Suicidal ideation 06/07/2019   Withdrawal symptoms, alcohol, with delirium (HCC) 06/07/2019   Alcoholic hepatitis without ascites 06/05/2019   Alcohol withdrawal (HCC) 06/03/2019   Obesity (BMI 30-39.9) 01/09/2016   HSV-2 (herpes simplex virus 2) infection

## 2024-01-25 NOTE — ED Notes (Signed)
 Patient A&Ox4. Denies intent to harm self/others when asked. Denies A/VH. Patient denies any physical complaints when asked. No acute distress noted. Support and encouragement provided. Routine safety checks conducted according to facility protocol. Encouraged patient to notify staff if thoughts of harm toward self or others arise. Patient verbalize understanding and agreement. Will continue to monitor for safety.

## 2024-01-25 NOTE — Group Note (Signed)
 Group Topic: Communication  Group Date: 01/25/2024 Start Time: 0915 End Time: 1000 Facilitators: Prentice Docker, RN  Department: [facility]  Number of Participants: 7  Group Focus: check in Treatment Modality:  Individual Therapy Interventions utilized were patient education Purpose: increase insight  Name: Trevor [name] Date of Birth: [birth year]  MR: [id number]    Level of Participation: active Quality of Participation: cooperative Interactions with others: gave feedback Mood/Affect: appropriate Triggers (if applicable): none identified Cognition: coherent/clear Progress: Gaining insight Response: verbalized understanding of indications of medications taken Plan: patient will be encouraged to remain compliant with medications prescribed after discharge  Patients Problems:  Patient Active Problem List   Diagnosis Date Noted   Alcohol use disorder 01/23/2024   Hyponatremia 06/19/2022   Transaminitis 06/18/2022   GERD (gastroesophageal reflux disease) 06/18/2022   Hypokalemia 06/18/2022   Loss of consciousness (HCC) 06/18/2022   QT prolongation 06/18/2022   Chronic hepatitis C without hepatic coma (HCC) 06/28/2019   Alcohol abuse 06/28/2019   Psoriasis 06/07/2019   Suicidal ideation 06/07/2019   Withdrawal symptoms, alcohol, with delirium (HCC) 06/07/2019   Alcoholic hepatitis without ascites 06/05/2019   Alcohol withdrawal (HCC) 06/03/2019   Obesity (BMI 30-39.9) 01/09/2016   HSV-2 (herpes simplex virus 2) infection

## 2024-01-25 NOTE — ED Notes (Signed)
 Pt is in the dayroom watching TV with peers. Pt denies SI/HI/AVH. Pt has no further complain.No acute distress noted. Will continue to monitor for safety and provide support.

## 2024-01-25 NOTE — ED Notes (Signed)
 Patient is sleeping. Respirations equal and unlabored, skin warm and dry. No change in assessment or acuity. Routine safety checks conducted according to facility protocol. Will continue to monitor for safety.

## 2024-01-25 NOTE — ED Provider Notes (Addendum)
 Behavioral Health Progress Note  Date and Time: 01/25/2024 10:40 AM Name: Trevor [name] MRN:  [id number]  Subjective:   Trevor [name]. Santucci is a 43 yr old male who presented on 3/28 to [facility] requesting help for EtOH Abuse, he was admitted to [facility] on 3/29 for alcohol detox and rehab services. PPHx is significant for Depression, Anxiety, EtOH Abuse, and has been to Residential Rehab 4 times, and no history of Suicide Attempts, Self Injurious Behavior, or Psychiatric Hospitalizations. PMHx is significant for Withdrawal Seizures.   Patient seen sleeping in his room, no acute distress. He reports feeling better today, reporting good sleep and good appetite. 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 PTA, he was staying at an [employer]. He reports withdrawal  symptoms of eye floaters, and improving tremor. Otherwise, he feels he doing well. He denies adverse effects from starting Zoloft. He denies SI and HI. He is requesting residential or long-term rehab treatment.  Diagnosis:  Final diagnoses:  Alcohol abuse  Moderate episode of recurrent major depressive disorder (HCC)  GAD (generalized anxiety disorder)   Past Psychiatric History:  Depression, Anxiety, EtOH Abuse, and has been to Residential Rehab 4 times, and no history of Suicide Attempts, Self Injurious Behavior, or Psychiatric Hospitalizations.    Past Medical History:  Depression, Anxiety, EtOH Abuse, and has been to Residential Rehab 4 times, and no history of Suicide Attempts, Self Injurious Behavior, or Psychiatric Hospitalizations.   Family History:  Mother- Skin Cancer Paternal Grandfather- Colon Cancer Paternal Grandmother and Grandfather- Lung Cancer  Family Psychiatric  History:  Brother- EtOH Abuse Maternal Grandfather and Grandmother- EtOH Abuse No Known Diagnosis' or Suicides  Social History:  Divorced, has a daughter.  Recently fired from his job. Started drinking at age 78, longest period of sobriety 6 months, been to Residential Rehab 4 times.   Current Medications:  Current Facility-Administered Medications  Medication Dose Route Frequency Provider Last Rate Last Admin   acetaminophen (TYLENOL) tablet 650 mg  650 mg Oral Q6H PRN Marlou Sa, NP       alum & mag hydroxide-simeth (MAALOX/MYLANTA) 200-200-20 MG/5ML suspension 30 mL  30 mL Oral Q4H PRN Rayburn Go, Veronique M, NP       hydrOXYzine (ATARAX) tablet 25 mg  25 mg Oral Q6H PRN Rayburn Go, Veronique M, NP   25 mg at 01/24/24 2113   loperamide (IMODIUM) capsule 2-4 mg  2-4 mg Oral PRN Marlou Sa, NP       LORazepam (ATIVAN) tablet 1 mg  1 mg Oral Q6H PRN Rayburn Go, Veronique M, NP       LORazepam (ATIVAN) tablet 1 mg  1 mg Oral BID Lauro Franklin, MD   1 mg at 01/25/24 4098   Followed by   Melene Muller ON 01/26/2024] LORazepam (ATIVAN) tablet 1 mg  1 mg Oral Daily Pashayan, Mardelle Matte, MD       magnesium hydroxide (MILK OF MAGNESIA) suspension 30 mL  30 mL Oral Daily PRN Rayburn Go, Veronique M, NP       multivitamin with minerals tablet 1 tablet  1 tablet Oral Daily Marlou Sa, NP   1 tablet at 01/25/24 0916   OLANZapine (ZYPREXA) injection 10 mg  10 mg Intramuscular TID PRN Marlou Sa, NP       OLANZapine (ZYPREXA) injection 5 mg  5 mg Intramuscular TID PRN Marlou Sa, NP  OLANZapine zydis (ZYPREXA) disintegrating tablet 5 mg  5 mg Oral TID PRN Rayburn Go, Veronique M, NP       ondansetron (ZOFRAN-ODT) disintegrating tablet 4 mg  4 mg Oral Q6H PRN Lauro Franklin, MD       sertraline (ZOLOFT) tablet 50 mg  50 mg Oral Daily Lauro Franklin, MD   50 mg at 01/25/24 4403   thiamine (VITAMIN B1) tablet 100 mg  100 mg Oral Daily Olin Pia M, NP   100 mg at 01/25/24 4742   traZODone (DESYREL) tablet 50 mg  50 mg Oral QHS PRN Marlou Sa, NP   50 mg at 01/23/24 2122    Current Outpatient Medications  Medication Sig Dispense Refill   famotidine (PEPCID) 20 MG tablet Take 20 mg by mouth 2 (two) times daily. (Patient not taking: Reported on 01/22/2024)     hydrOXYzine (ATARAX) 50 MG tablet Take 50 mg by mouth every 8 (eight) hours as needed for anxiety. (Patient not taking: Reported on 01/22/2024)     Multiple Vitamin (MULTIVITAMIN WITH MINERALS) TABS tablet Take 1 tablet by mouth daily.     thiamine (VITAMIN B1) 100 MG tablet Take 1 tablet (100 mg total) by mouth daily. (Patient not taking: Reported on 01/22/2024) 30 tablet 0   traZODone (DESYREL) 150 MG tablet Take 150 mg by mouth at bedtime. (Patient not taking: Reported on 01/22/2024)      Labs  Lab Results:  Admission on 01/22/2024, Discharged on 01/23/2024  Component Date Value Ref Range Status   WBC 01/22/2024 6.8  4.0 - 10.5 K/uL Final   RBC 01/22/2024 5.22  4.22 - 5.81 MIL/uL Final   Hemoglobin 01/22/2024 17.2 (H)  13.0 - 17.0 g/dL Final   HCT 59/56/3875 47.8  39.0 - 52.0 % Final   MCV 01/22/2024 91.6  80.0 - 100.0 fL Final   MCH 01/22/2024 33.0  26.0 - 34.0 pg Final   MCHC 01/22/2024 36.0  30.0 - 36.0 g/dL Final   RDW 64/33/2951 12.1  11.5 - 15.5 % Final   Platelets 01/22/2024 319  150 - 400 K/uL Final   nRBC 01/22/2024 0.0  0.0 - 0.2 % Final   Neutrophils Relative % 01/22/2024 70  % Final   Neutro Abs 01/22/2024 4.8  1.7 - 7.7 K/uL Final   Lymphocytes Relative 01/22/2024 25  % Final   Lymphs Abs 01/22/2024 1.7  0.7 - 4.0 K/uL Final   Monocytes Relative 01/22/2024 4  % Final   Monocytes Absolute 01/22/2024 0.3  0.1 - 1.0 K/uL Final   Eosinophils Relative 01/22/2024 0  % Final   Eosinophils Absolute 01/22/2024 0.0  0.0 - 0.5 K/uL Final   Basophils Relative 01/22/2024 1  % Final   Basophils Absolute 01/22/2024 0.1  0.0 - 0.1 K/uL Final   Immature Granulocytes 01/22/2024 0  % Final   Abs Immature Granulocytes 01/22/2024 0.02  0.00 - 0.07 K/uL Final   Performed at [facility], [address], [place], [place] [address]   Sodium 01/22/2024 138  135 - 145 mmol/L Final   Potassium 01/22/2024 3.9  3.5 - 5.1 mmol/L Final   Chloride 01/22/2024 99  98 - 111 mmol/L Final   CO2 01/22/2024 25  22 - 32 mmol/L Final   Glucose, Bld 01/22/2024 99  70 - 99 mg/dL Final   Glucose reference range applies only to samples taken after fasting for at least 8 hours.   BUN 01/22/2024 13  6 - 20  mg/dL Final   Creatinine, Ser 01/22/2024 0.83  0.61 - 1.24 mg/dL Final   Calcium 60/45/4098 8.9  8.9 - 10.3 mg/dL Final   Total Protein 11/91/4782 7.5  6.5 - 8.1 g/dL Final   Albumin 95/62/1308 4.6  3.5 - 5.0 g/dL Final   AST 65/78/4696 44 (H)  15 - 41 U/L Final   ALT 01/22/2024 46 (H)  0 - 44 U/L Final   Alkaline Phosphatase 01/22/2024 60  38 - 126 U/L Final   Total Bilirubin 01/22/2024 0.8  0.0 - 1.2 mg/dL Final   GFR, Estimated 01/22/2024 >60  >60 mL/min Final   Comment: (NOTE) Calculated using the CKD-EPI Creatinine Equation (2021)    Anion gap 01/22/2024 14  5 - 15 Final   Performed at [facility], [address], [place], [place] [address]   Hgb A1c MFr Bld 01/22/2024 5.0  4.8 - 5.6 % Final   Comment: (NOTE) Pre diabetes:          5.7%-6.4%  Diabetes:              >6.4%  Glycemic control for   <7.0% adults with diabetes    Mean Plasma Glucose 01/22/2024 96.8  mg/dL Final   Performed at [facility], [address], [place], [place] [address]   Magnesium 01/22/2024 1.9  1.7 - 2.4 mg/dL Final   Performed at [facility], [address] [place], [place] [address]   Alcohol, Ethyl (B) 01/22/2024 109 (H)  <10 mg/dL Final   Comment: (NOTE) Lowest detectable limit for serum alcohol is 10 mg/dL.  For medical purposes only. Performed at [facility], [address], [place], [place] [address]    Cholesterol 01/22/2024 202 (H)  0 - 200 mg/dL Final   Triglycerides 64/40/3474 65  <150 mg/dL Final   HDL 25/95/6387 84  >40 mg/dL Final   Total CHOL/HDL Ratio 01/22/2024  2.4  RATIO Final   VLDL 01/22/2024 13  0 - 40 mg/dL Final   LDL Cholesterol 01/22/2024 105 (H)  0 - 99 mg/dL Final   Comment:        Total Cholesterol/HDL:CHD Risk Coronary Heart Disease Risk Table                     Men   Women  1/2 Average Risk   3.4   3.3  Average Risk       5.0   4.4  2 X Average Risk   9.6   7.1  3 X Average Risk  23.4   11.0        Use the calculated Patient Ratio above and the CHD Risk Table to determine the patient's CHD Risk.        ATP III CLASSIFICATION (LDL):  <100     mg/dL   Optimal  564-332  mg/dL   Near or Above                    Optimal  130-159  mg/dL   Borderline  951-884  mg/dL   High  >166     mg/dL   Very High Performed at [facility], [address], [place], [place] [address]    TSH 01/22/2024 0.458  0.350 - 4.500 uIU/mL Final   Comment: Performed by a 3rd Generation assay with a functional sensitivity of <=0.01 uIU/mL. Performed at [facility], [address], [place], [place] [address]  RPR Ser Ql 01/22/2024 NON REACTIVE  NON REACTIVE Final   Performed at [facility], [address] [place], [place] [address]   Color, Urine 01/22/2024 YELLOW  YELLOW Final   APPearance 01/22/2024 CLEAR  CLEAR Final   Specific Gravity, Urine 01/22/2024 1.020  1.005 - 1.030 Final   pH 01/22/2024 5.0  5.0 - 8.0 Final   Glucose, UA 01/22/2024 NEGATIVE  NEGATIVE mg/dL Final   Hgb urine dipstick 01/22/2024 NEGATIVE  NEGATIVE Final   Bilirubin Urine 01/22/2024 NEGATIVE  NEGATIVE Final   Ketones, ur 01/22/2024 5 (A)  NEGATIVE mg/dL Final   Protein, ur 60/45/4098 NEGATIVE  NEGATIVE mg/dL Final   Nitrite 11/91/4782 NEGATIVE  NEGATIVE Final   Leukocytes,Ua 01/22/2024 NEGATIVE  NEGATIVE Final   Performed at [facility], [address], [place], [place] [address]   POC Amphetamine UR 01/22/2024 None Detected   Final   POC Secobarbital (BAR) 01/22/2024 None Detected   Final   POC Buprenorphine (BUP) 01/22/2024 None Detected    Final   POC Oxazepam (BZO) 01/22/2024 None Detected   Final   POC Cocaine UR 01/22/2024 None Detected   Final   POC Methamphetamine UR 01/22/2024 None Detected   Final   POC Morphine 01/22/2024 None Detected   Final   POC Methadone UR 01/22/2024 None Detected   Final   POC Oxycodone UR 01/22/2024 None Detected   Final   POC Marijuana UR 01/22/2024 None Detected   Final  Admission on 12/24/2023, Discharged on 12/25/2023  Component Date Value Ref Range Status   Sodium 12/24/2023 144  135 - 145 mmol/L Final   Potassium 12/24/2023 3.8  3.5 - 5.1 mmol/L Final   Chloride 12/24/2023 107  98 - 111 mmol/L Final   CO2 12/24/2023 26  22 - 32 mmol/L Final   Glucose, Bld 12/24/2023 96  70 - 99 mg/dL Final   Glucose reference range applies only to samples taken after fasting for at least 8 hours.   BUN 12/24/2023 11  6 - 20 mg/dL Final   Creatinine, Ser 12/24/2023 0.62  0.61 - 1.24 mg/dL Final   Calcium 30/86/5784 8.6 (L)  8.9 - 10.3 mg/dL Final   Total Protein 69/62/9528 7.8  6.5 - 8.1 g/dL Final   Albumin 41/32/4401 4.3  3.5 - 5.0 g/dL Final   AST 02/72/5366 34  15 - 41 U/L Final   ALT 12/24/2023 27  0 - 44 U/L Final   Alkaline Phosphatase 12/24/2023 56  38 - 126 U/L Final   Total Bilirubin 12/24/2023 0.4  0.0 - 1.2 mg/dL Final   GFR, Estimated 12/24/2023 >60  >60 mL/min Final   Comment: (NOTE) Calculated using the CKD-EPI Creatinine Equation (2021)    Anion gap 12/24/2023 11  5 - 15 Final   Performed at [facility], [address] [place], [place] [address]   Alcohol, Ethyl (B) 12/24/2023 436 (HH)  <10 mg/dL Final   Comment: CRITICAL RESULT CALLED TO, READ BACK BY AND VERIFIED WITH Barnett Applebaum, RN 12/25/23 0050 BY K. DAVIS (NOTE) Lowest detectable limit for serum alcohol is 10 mg/dL.  For medical purposes only. Performed at [facility], [address], [place], [place] [address]    Opiates 12/25/2023 NONE DETECTED  NONE DETECTED Final   Cocaine  12/25/2023 NONE DETECTED  NONE DETECTED Final   Benzodiazepines 12/25/2023 NONE DETECTED  NONE DETECTED Final   Amphetamines 12/25/2023 NONE DETECTED  NONE DETECTED Final   Tetrahydrocannabinol  12/25/2023 NONE DETECTED  NONE DETECTED Final   Barbiturates 12/25/2023 NONE DETECTED  NONE DETECTED Final   Comment: (NOTE) DRUG SCREEN FOR MEDICAL PURPOSES ONLY.  IF CONFIRMATION IS NEEDED FOR ANY PURPOSE, NOTIFY LAB WITHIN 5 DAYS.  LOWEST DETECTABLE LIMITS FOR URINE DRUG SCREEN Drug Class                     Cutoff (ng/mL) Amphetamine and metabolites    1000 Barbiturate and metabolites    200 Benzodiazepine                 200 Opiates and metabolites        300 Cocaine and metabolites        300 THC                            50 Performed at [facility], [address] [place], [place] [address]    WBC 12/24/2023 5.1  4.0 - 10.5 K/uL Final   RBC 12/24/2023 4.78  4.22 - 5.81 MIL/uL Final   Hemoglobin 12/24/2023 16.0  13.0 - 17.0 g/dL Final   HCT 91/47/8295 45.7  39.0 - 52.0 % Final   MCV 12/24/2023 95.6  80.0 - 100.0 fL Final   MCH 12/24/2023 33.5  26.0 - 34.0 pg Final   MCHC 12/24/2023 35.0  30.0 - 36.0 g/dL Final   RDW 62/13/0865 12.8  11.5 - 15.5 % Final   Platelets 12/24/2023 292  150 - 400 K/uL Final   nRBC 12/24/2023 0.0  0.0 - 0.2 % Final   Neutrophils Relative % 12/24/2023 61  % Final   Neutro Abs 12/24/2023 3.0  1.7 - 7.7 K/uL Final   Lymphocytes Relative 12/24/2023 31  % Final   Lymphs Abs 12/24/2023 1.6  0.7 - 4.0 K/uL Final   Monocytes Relative 12/24/2023 8  % Final   Monocytes Absolute 12/24/2023 0.4  0.1 - 1.0 K/uL Final   Eosinophils Relative 12/24/2023 0  % Final   Eosinophils Absolute 12/24/2023 0.0  0.0 - 0.5 K/uL Final   Basophils Relative 12/24/2023 0  % Final   Basophils Absolute 12/24/2023 0.0  0.0 - 0.1 K/uL Final   Immature Granulocytes 12/24/2023 0  % Final   Abs Immature Granulocytes 12/24/2023 0.02  0.00 - 0.07 K/uL Final    Performed at [facility], [address] [place], [place] [address]   Lipase 12/24/2023 41  11 - 51 U/L Final   Performed at [facility], [address] [place], [place] [address]   Color, Urine 12/25/2023 YELLOW  YELLOW Final   APPearance 12/25/2023 HAZY (A)  CLEAR Final   Specific Gravity, Urine 12/25/2023 1.010  1.005 - 1.030 Final   pH 12/25/2023 7.0  5.0 - 8.0 Final   Glucose, UA 12/25/2023 NEGATIVE  NEGATIVE mg/dL Final   Hgb urine dipstick 12/25/2023 NEGATIVE  NEGATIVE Final   Bilirubin Urine 12/25/2023 NEGATIVE  NEGATIVE Final   Ketones, ur 12/25/2023 NEGATIVE  NEGATIVE mg/dL Final   Protein, ur 84/13/2440 NEGATIVE  NEGATIVE mg/dL Final   Nitrite 08/23/2535 NEGATIVE  NEGATIVE Final   Leukocytes,Ua 12/25/2023 NEGATIVE  NEGATIVE Final   Performed at [facility], [address] [place], [place] [address]  Admission on 09/23/2023, Discharged on 09/29/2023  Component Date Value Ref Range Status   WBC 09/23/2023 5.2  4.0 - 10.5 K/uL Final   RBC 09/23/2023 5.10  4.22 - 5.81 MIL/uL Final   Hemoglobin 09/23/2023 16.9  13.0 - 17.0 g/dL Final   HCT 82/95/6213 46.5  39.0 - 52.0 % Final   MCV 09/23/2023 91.2  80.0 - 100.0 fL Final   MCH 09/23/2023 33.1  26.0 - 34.0 pg Final   MCHC 09/23/2023 36.3 (H)  30.0 - 36.0 g/dL Final   RDW 08/65/7846 12.9  11.5 - 15.5 % Final   Platelets 09/23/2023 185  150 - 400 K/uL Final   nRBC 09/23/2023 0.0  0.0 - 0.2 % Final   Performed at [facility], [address], [place], [place] [address]   Sodium 09/23/2023 139  135 - 145 mmol/L Final   Potassium 09/23/2023 3.5  3.5 - 5.1 mmol/L Final   Chloride 09/23/2023 99  98 - 111 mmol/L Final   CO2 09/23/2023 22  22 - 32 mmol/L Final   Glucose, Bld 09/23/2023 169 (H)  70 - 99 mg/dL Final   Glucose reference range applies only to samples taken after fasting for at least 8 hours.   BUN 09/23/2023 6  6 - 20 mg/dL Final   Creatinine, Ser  09/23/2023 0.71  0.61 - 1.24 mg/dL Final   Calcium 28/41/3244 8.4 (L)  8.9 - 10.3 mg/dL Final   Total Protein 10/29/7251 6.9  6.5 - 8.1 g/dL Final   Albumin 66/44/0347 4.0  3.5 - 5.0 g/dL Final   AST 42/59/5638 135 (H)  15 - 41 U/L Final   ALT 09/23/2023 76 (H)  0 - 44 U/L Final   Alkaline Phosphatase 09/23/2023 58  38 - 126 U/L Final   Total Bilirubin 09/23/2023 0.9  <1.2 mg/dL Final   GFR, Estimated 09/23/2023 >60  >60 mL/min Final   Comment: (NOTE) Calculated using the CKD-EPI Creatinine Equation (2021)    Anion gap 09/23/2023 18 (H)  5 - 15 Final   Performed at [facility], [address], [place], [place] [address]   Troponin I (High Sensitivity) 09/23/2023 14  <18 ng/L Final   Comment: (NOTE) Elevated high sensitivity troponin I (hsTnI) values and significant  changes across serial measurements may suggest ACS but many other  chronic and acute conditions are known to elevate hsTnI results.  Refer to the "Links" section for chest pain algorithms and additional  guidance. Performed at [facility], [address], [place], [place] [address]    Alcohol, Ethyl (B) 09/23/2023 412 (HH)  <10 mg/dL Final   Comment: CRITICAL RESULT CALLED TO, READ BACK BY AND VERIFIED WITH B. OSORIO, RN AT [phone] 11.27.24 JLASIGAN (NOTE) Lowest detectable limit for serum alcohol is 10 mg/dL.  For medical purposes only. Performed at [facility], [address] [place], [place] [address]    Lipase 09/23/2023 86 (H)  11 - 51 U/L Final   Performed at [facility], [address] [place], [place] [address]   Troponin I (High Sensitivity) 09/23/2023 19 (H)  <18 ng/L Final   Comment: (NOTE) Elevated high sensitivity troponin I (hsTnI) values and significant  changes across serial measurements may suggest ACS but many other  chronic and acute conditions are known to elevate hsTnI results.  Refer to the "Links" section for chest pain algorithms and additional   guidance. Performed at [facility], [address] [place], [place] [address]    Magnesium 09/23/2023 1.8  1.7 - 2.4 mg/dL Final   Performed at [facility], [address],  [place], [place] [address]   Phosphorus 09/23/2023 2.2 (L)  2.5 - 4.6 mg/dL Final   Performed at [facility], [address] [place], [place] [address]   Opiates 09/23/2023 NONE DETECTED  NONE DETECTED Final   Cocaine 09/23/2023 NONE DETECTED  NONE DETECTED Final   Benzodiazepines 09/23/2023 NONE DETECTED  NONE DETECTED Final   Amphetamines 09/23/2023 NONE DETECTED  NONE DETECTED Final   Tetrahydrocannabinol 09/23/2023 NONE DETECTED  NONE DETECTED Final   Barbiturates 09/23/2023 NONE DETECTED  NONE DETECTED Final   Comment: (NOTE) DRUG SCREEN FOR MEDICAL PURPOSES ONLY.  IF CONFIRMATION IS NEEDED FOR ANY PURPOSE, NOTIFY LAB WITHIN 5 DAYS.  LOWEST DETECTABLE LIMITS FOR URINE DRUG SCREEN Drug Class                     Cutoff (ng/mL) Amphetamine and metabolites    1000 Barbiturate and metabolites    200 Benzodiazepine                 200 Opiates and metabolites        300 Cocaine and metabolites        300 THC                            50 Performed at [facility], [address] [place], [place] [address]    HIV Screen 4th Generation wRfx 09/23/2023 Non Reactive  Non Reactive Final   Performed at [facility], [address] [place], [place] [address]   Phosphorus 09/24/2023 3.1  2.5 - 4.6 mg/dL Final   Performed at [facility], [address], [place], [place] [address]   Lipase 09/24/2023 92 (H)  11 - 51 U/L Final   Performed at [facility], [address], [place], [place] [address]   Sodium 09/24/2023 130 (L)  135 - 145 mmol/L Final   DELTA CHECK NOTED   Potassium 09/24/2023 3.8  3.5 - 5.1 mmol/L Final   Chloride 09/24/2023 98  98 - 111 mmol/L Final   CO2 09/24/2023 24  22 - 32 mmol/L Final   Glucose, Bld 09/24/2023 94  70 - 99 mg/dL Final   Glucose  reference range applies only to samples taken after fasting for at least 8 hours.   BUN 09/24/2023 9  6 - 20 mg/dL Final   Creatinine, Ser 09/24/2023 0.63  0.61 - 1.24 mg/dL Final   Calcium 57/84/6962 8.5 (L)  8.9 - 10.3 mg/dL Final   Total Protein 95/28/4132 5.6 (L)  6.5 - 8.1 g/dL Final   Albumin 44/10/270 3.2 (L)  3.5 - 5.0 g/dL Final   AST 53/66/4403 78 (H)  15 - 41 U/L Final   ALT 09/24/2023 54 (H)  0 - 44 U/L Final   Alkaline Phosphatase 09/24/2023 42  38 - 126 U/L Final   Total Bilirubin 09/24/2023 1.2 (H)  <1.2 mg/dL Final   GFR, Estimated 09/24/2023 >60  >60 mL/min Final   Comment: (NOTE) Calculated using the CKD-EPI Creatinine Equation (2021)    Anion gap 09/24/2023 8  5 - 15 Final   Performed at [facility], [address], [place], [place] [address]   WBC 09/24/2023 4.1  4.0 - 10.5 K/uL Final   RBC 09/24/2023 3.97 (L)  4.22 - 5.81 MIL/uL Final   Hemoglobin 09/24/2023 13.2  13.0 - 17.0 g/dL Final   HCT 95/63/8756 37.2 (L)  39.0 - 52.0 % Final   MCV 09/24/2023 93.7  80.0 - 100.0 fL Final   MCH 09/24/2023 33.2  26.0 - 34.0 pg Final   MCHC 09/24/2023 35.5  30.0 - 36.0 g/dL Final   RDW 84/13/2440 12.8  11.5 - 15.5 % Final   Platelets 09/24/2023 130 (L)  150 - 400 K/uL Final   nRBC 09/24/2023 0.0  0.0 - 0.2 % Final   Neutrophils Relative % 09/24/2023 60  % Final   Neutro Abs 09/24/2023 2.5  1.7 - 7.7 K/uL Final   Lymphocytes Relative 09/24/2023 27  % Final   Lymphs Abs 09/24/2023 1.1  0.7 - 4.0 K/uL Final   Monocytes Relative 09/24/2023 11  % Final   Monocytes Absolute 09/24/2023 0.4  0.1 - 1.0 K/uL Final   Eosinophils Relative 09/24/2023 1  % Final   Eosinophils Absolute 09/24/2023 0.0  0.0 - 0.5 K/uL Final   Basophils Relative 09/24/2023 1  % Final   Basophils Absolute 09/24/2023 0.0  0.0 - 0.1 K/uL Final   Immature Granulocytes 09/24/2023 0  % Final   Abs Immature Granulocytes 09/24/2023 0.01  0.00 - 0.07 K/uL Final   Performed at [facility], [address], [place], [place] [address]   Magnesium 09/24/2023 1.6 (L)  1.7 - 2.4 mg/dL Final   Performed at [facility], [address], [place], [place] [address]   WBC 09/25/2023 5.1  4.0 - 10.5 K/uL Final   RBC 09/25/2023 4.20 (L)  4.22 - 5.81 MIL/uL Final   Hemoglobin 09/25/2023 13.5  13.0 - 17.0 g/dL Final   HCT 40/34/7425 40.0  39.0 - 52.0 % Final   MCV 09/25/2023 95.2  80.0 - 100.0 fL Final   MCH 09/25/2023 32.1  26.0 - 34.0 pg Final   MCHC 09/25/2023 33.8  30.0 - 36.0 g/dL Final   RDW 95/63/8756 13.1  11.5 - 15.5 % Final   Platelets 09/25/2023 138 (L)  150 - 400 K/uL Final   REPEATED TO VERIFY   nRBC 09/25/2023 0.0  0.0 - 0.2 % Final   Performed at [facility], [address] [place], [place] [address]   Sodium 09/25/2023 134 (L)  135 - 145 mmol/L Final   Potassium 09/25/2023 3.9  3.5 - 5.1 mmol/L Final   Chloride 09/25/2023 102  98 - 111 mmol/L Final   CO2 09/25/2023 22  22 - 32 mmol/L Final   Glucose, Bld 09/25/2023 92  70 - 99 mg/dL Final   Glucose reference range applies only to samples taken after fasting for at least 8 hours.   BUN 09/25/2023 12  6 - 20 mg/dL Final   Creatinine, Ser 09/25/2023 0.61  0.61 - 1.24 mg/dL Final   Calcium 51/88/4166 9.0  8.9 - 10.3 mg/dL Final   GFR, Estimated 09/25/2023 >60  >60 mL/min Final   Comment: (NOTE) Calculated using the CKD-EPI Creatinine Equation (2021)    Anion gap 09/25/2023 10  5 - 15 Final   Performed at [facility], [address] [place], [place] [address]   Phosphorus 09/25/2023 5.5 (H)  2.5 - 4.6 mg/dL Final   Performed at [facility], [address], [place], [place] [address]   Magnesium 09/25/2023 2.2  1.7 - 2.4 mg/dL Final   Performed at [facility], [address] [place], [place] [address]   Glucose-Capillary 09/25/2023 88  70 - 99 mg/dL Final   Glucose reference range  applies only to samples taken after fasting for at least 8 hours.   Glucose-Capillary 09/25/2023 101 (H)  70 - 99  mg/dL Final   Glucose reference range applies only to samples taken after fasting for at least 8 hours.   Glucose-Capillary 09/25/2023 109 (H)  70 - 99 mg/dL Final   Glucose reference range applies only to samples taken after fasting for at least 8 hours.   WBC 09/26/2023 5.2  4.0 - 10.5 K/uL Final   RBC 09/26/2023 4.26  4.22 - 5.81 MIL/uL Final   Hemoglobin 09/26/2023 14.0  13.0 - 17.0 g/dL Final   HCT 08/23/2535 40.3  39.0 - 52.0 % Final   MCV 09/26/2023 94.6  80.0 - 100.0 fL Final   MCH 09/26/2023 32.9  26.0 - 34.0 pg Final   MCHC 09/26/2023 34.7  30.0 - 36.0 g/dL Final   RDW 64/40/3474 13.1  11.5 - 15.5 % Final   Platelets 09/26/2023 143 (L)  150 - 400 K/uL Final   nRBC 09/26/2023 0.0  0.0 - 0.2 % Final   Performed at [facility], [address], [place], [place] [address]   Sodium 09/26/2023 134 (L)  135 - 145 mmol/L Final   Potassium 09/26/2023 4.4  3.5 - 5.1 mmol/L Final   Chloride 09/26/2023 106  98 - 111 mmol/L Final   CO2 09/26/2023 21 (L)  22 - 32 mmol/L Final   Glucose, Bld 09/26/2023 104 (H)  70 - 99 mg/dL Final   Glucose reference range applies only to samples taken after fasting for at least 8 hours.   BUN 09/26/2023 19  6 - 20 mg/dL Final   Creatinine, Ser 09/26/2023 0.60 (L)  0.61 - 1.24 mg/dL Final   Calcium 38/75/6433 8.6 (L)  8.9 - 10.3 mg/dL Final   GFR, Estimated 09/26/2023 >60  >60 mL/min Final   Comment: (NOTE) Calculated using the CKD-EPI Creatinine Equation (2021)    Anion gap 09/26/2023 7  5 - 15 Final   Performed at [facility], [address] [place], [place] [address]   Phosphorus 09/26/2023 3.9  2.5 - 4.6 mg/dL Final   Performed at [facility], [address] [place], [place] [address]   Magnesium 09/26/2023 2.1  1.7 - 2.4 mg/dL Final   Performed at [facility], [address], [place], [place] [address]   WBC 09/27/2023 5.4  4.0 - 10.5 K/uL Final   RBC 09/27/2023 4.42  4.22 - 5.81 MIL/uL Final   Hemoglobin 09/27/2023  14.5  13.0 - 17.0 g/dL Final   HCT 60/07/9322 42.1  39.0 - 52.0 % Final   MCV 09/27/2023 95.2  80.0 - 100.0 fL Final   MCH 09/27/2023 32.8  26.0 - 34.0 pg Final   MCHC 09/27/2023 34.4  30.0 - 36.0 g/dL Final   RDW 55/73/2202 13.0  11.5 - 15.5 % Final   Platelets 09/27/2023 163  150 - 400 K/uL Final   nRBC 09/27/2023 0.0  0.0 - 0.2 % Final   Performed at [facility], [address], [place], [place] [address]   Sodium 09/27/2023 136  135 - 145 mmol/L Final   Potassium 09/27/2023 4.4  3.5 - 5.1 mmol/L Final   Chloride 09/27/2023 104  98 - 111 mmol/L Final   CO2 09/27/2023 25  22 - 32 mmol/L Final   Glucose, Bld 09/27/2023 96  70 - 99 mg/dL Final   Glucose reference range applies only to samples  taken after fasting for at least 8 hours.   BUN 09/27/2023 11  6 - 20 mg/dL Final   Creatinine, Ser 09/27/2023 0.75  0.61 - 1.24 mg/dL Final   Calcium 40/98/1191 9.3  8.9 - 10.3 mg/dL Final   GFR, Estimated 09/27/2023 >60  >60 mL/min Final   Comment: (NOTE) Calculated using the CKD-EPI Creatinine Equation (2021)    Anion gap 09/27/2023 7  5 - 15 Final   Performed at [facility], [address], [place], [place] [address]   Phosphorus 09/27/2023 4.5  2.5 - 4.6 mg/dL Final   Performed at [facility], [address], [place], [place] [address]   Magnesium 09/27/2023 2.2  1.7 - 2.4 mg/dL Final   Performed at [facility], [address] [place], [place] [address]   Glucose-Capillary 09/26/2023 98  70 - 99 mg/dL Final   Glucose reference range applies only to samples taken after fasting for at least 8 hours.   WBC 09/28/2023 5.6  4.0 - 10.5 K/uL Final   RBC 09/28/2023 4.35  4.22 - 5.81 MIL/uL Final   Hemoglobin 09/28/2023 14.5  13.0 - 17.0 g/dL Final   HCT 84/69/6295 42.1  39.0 - 52.0 % Final   MCV 09/28/2023 96.8  80.0 - 100.0 fL Final   MCH 09/28/2023 33.3  26.0 - 34.0 pg Final   MCHC 09/28/2023 34.4  30.0 - 36.0 g/dL Final   RDW 28/41/3244 12.9  11.5 - 15.5 % Final    Platelets 09/28/2023 182  150 - 400 K/uL Final   nRBC 09/28/2023 0.0  0.0 - 0.2 % Final   Performed at [facility], [address] [place], [place] [address]   Sodium 09/28/2023 135  135 - 145 mmol/L Final   Potassium 09/28/2023 4.2  3.5 - 5.1 mmol/L Final   Chloride 09/28/2023 104  98 - 111 mmol/L Final   CO2 09/28/2023 24  22 - 32 mmol/L Final   Glucose, Bld 09/28/2023 102 (H)  70 - 99 mg/dL Final   Glucose reference range applies only to samples taken after fasting for at least 8 hours.   BUN 09/28/2023 16  6 - 20 mg/dL Final   Creatinine, Ser 09/28/2023 0.75  0.61 - 1.24 mg/dL Final   Calcium 25/36/6440 8.8 (L)  8.9 - 10.3 mg/dL Final   GFR, Estimated 09/28/2023 >60  >60 mL/min Final   Comment: (NOTE) Calculated using the CKD-EPI Creatinine Equation (2021)    Anion gap 09/28/2023 7  5 - 15 Final   Performed at [facility], [address], [place], [place] [address]   Phosphorus 09/28/2023 3.9  2.5 - 4.6 mg/dL Final   Performed at [facility], [address] [place], [place] [address]   Magnesium 09/28/2023 1.9  1.7 - 2.4 mg/dL Final   Performed at [facility], [address] [place], [place] [address]   Total Protein 09/28/2023 6.6  6.5 - 8.1 g/dL Final   Albumin 33/29/5188 3.7  3.5 - 5.0 g/dL Final   AST 41/66/0630 28  15 - 41 U/L Final   ALT 09/28/2023 38  0 - 44 U/L Final   Alkaline Phosphatase 09/28/2023 40  38 - 126 U/L Final   Total Bilirubin 09/28/2023 0.7  <1.2 mg/dL Final   Bilirubin, Direct 09/28/2023 0.1  0.0 - 0.2 mg/dL Final   Indirect Bilirubin 09/28/2023 0.6  0.3 - 0.9 mg/dL Final   Performed at  [facility], [address] [place], [place], [place] [address]   Lipase 09/28/2023 84 (H)  11 - 51 U/L Final   Performed at [facility], [address] [place], [place] [address]    Blood Alcohol level:  Lab Results  Component Value Date   ETH 109 (H) 01/22/2024   ETH 436 (HH) 12/24/2023    Metabolic Disorder Labs: Lab  Results  Component Value Date   HGBA1C 5.0 01/22/2024   MPG 96.8 01/22/2024   MPG 108 11/07/2014   No results found for: "PROLACTIN" Lab Results  Component Value Date   CHOL 202 (H) 01/22/2024   TRIG 65 01/22/2024   HDL 84 01/22/2024   CHOLHDL 2.4 01/22/2024   VLDL 13 01/22/2024   LDLCALC 105 (H) 01/22/2024   LDLCALC 137 (H) 01/09/2016    Therapeutic Lab Levels: No results found for: "LITHIUM" No results found for: "VALPROATE" No results found for: "CBMZ"  Physical Findings   AUDIT    Flowsheet Row ED from 01/23/2024 in [facility]  Alcohol Use Disorder Identification Test Final Score (AUDIT) 36      GAD-7    Flowsheet Row Counselor from 10/22/2023 in [facility]  Total GAD-7 Score 8      PHQ2-9    Flowsheet Row ED from 01/23/2024 in [facility] ED from 01/22/2024 in [facility] Counselor from 10/22/2023 in [facility] Office Visit from 08/22/2016 in [facility] Visit from 01/09/2016 in [facility]  PHQ-2 Total Score 3 4 0 0 0  PHQ-9 Total Score 14 12 -- -- --      Flowsheet Row ED from 01/23/2024 in [facility] ED from 01/22/2024 in [facility] ED from 12/24/2023 in [facility] Emergency Department at [facility]  C-SSRS RISK CATEGORY Low Risk Error: Q3, 4, or 5 should not be populated when Q2 is No No Risk        Musculoskeletal  Strength & Muscle Tone: within normal limits Gait & Station: normal Patient leans: N/A  Psychiatric Specialty Exam  Presentation  General Appearance:  Appropriate for Environment; Fairly Groomed  Eye Contact: Fair  Speech: Clear and Coherent; Normal Rate  Speech Volume: Normal  Handedness: Right   Mood and Affect   Mood: Euthymic  Affect: Congruent; Appropriate   Thought Process  Thought Processes: Coherent  Descriptions of Associations:Intact  Orientation:Full (Time, Place and Person)  Thought Content:Logical  Diagnosis of Schizophrenia or Schizoaffective disorder in past: No    Hallucinations:Hallucinations: None  Ideas of Reference:None  Suicidal Thoughts:Suicidal Thoughts: No  Homicidal Thoughts:Homicidal Thoughts: No   Sensorium  Memory: Remote Good  Judgment: Fair  Insight: Fair   Art therapist  Concentration: Good  Attention Span: Good  Recall: Good  Fund of Knowledge: Good  Language: Good   Psychomotor Activity  Psychomotor Activity: Psychomotor Activity: Normal   Assets  Assets: Communication Skills; Resilience; Social Support   Sleep  Sleep: Sleep: Good   Physical Exam  Physical Exam Vitals and nursing note reviewed.  Constitutional:      General: He is not in acute distress.    Appearance: Normal appearance. He is normal weight. He is not ill-appearing or toxic-appearing.  HENT:     Head: Normocephalic and atraumatic.  Pulmonary:     Effort: Pulmonary effort  is normal.  Musculoskeletal:        General: Normal range of motion.  Neurological:     General: No focal deficit present.     Mental Status: He is alert.    Review of Systems  Respiratory:  Negative for cough and shortness of breath.   Cardiovascular:  Negative for chest pain.  Gastrointestinal:  Negative for abdominal pain, constipation, diarrhea, nausea and vomiting.  Neurological:  Positive for tremors. Negative for dizziness, weakness and headaches.  Psychiatric/Behavioral:  Negative for hallucinations and suicidal ideas. The patient is not nervous/anxious.    Blood pressure (!) 130/90, pulse 80, temperature 98.1 F (36.7 C), temperature source Oral, resp. rate 18, SpO2 97%. There is no height or weight on file to calculate BMI.  Treatment Plan  Summary: Daily contact with patient to assess and evaluate symptoms and progress in treatment and Medication management  Trevor [name]. Langenderfer is a 43 yr old male who presented on 3/28 to [facility] requesting help for EtOH Abuse, he was admitted to [facility] on 3/29.  PPHx is significant for Depression, Anxiety, EtOH Abuse, and has been to Residential Rehab 4 times, and no history of Suicide Attempts, Self Injurious Behavior, or Psychiatric Hospitalizations.  PMHx is significant for Withdrawal Seizures.    Stran is continuing to have withdrawal symptoms-simproving tremors but having no signs/symptoms of seizure.  Given his history of withdrawal seizures we will continue with the scheduled Ativan taper to end on 4/2.  He tolerated the starting dose of Zoloft yesterday so we will continue with the plan to increase to 50 mg today.  He continues to be interested in residential rehab and appreciate social work's assistance with this.  We will not make any other changes to his medications at this time.  We will continue to monitor.   MDD, Recurrent, Moderate  GAD: -Continue Zoloft 50 mg daily for depression and anxiety. -Continue Agitation Protocol: Zyprexa     Withdrawal: -Continue CIWA, last score= 0 -Continue Ativan taper to end 4/2 -Continue Ativan 1 mg q6 PRN CIWA>10 -Continue Imodium 2-4 mg PRN diarrhea -Continue Zofran-ODT 4 mg q6 PRN nausea -Continue Thiamine 100 mg daily for nutritional supplementation -Continue Multivitamin daily for nutritional supplementation      -Continue PRN's: Tylenol, Maalox, Atarax, Milk of Magnesia, Trazodone     Dispo: Interested in Residential Rehab   Lance Muss, MD 01/25/2024 10:40 AM

## 2024-01-25 NOTE — Discharge Planning (Signed)
 LCSW spoke with patient regarding disposition plans.  Patient informed LCSW that he is from an [place] [facility] however he would like to go to a residential program at discharge.  Patient reports he has completed residential placement at least 4 times in the past.  Patient reports needing longer term sobriety at this time and reports having support from his father and brother in this time. Patient asked if there are 77month, 69month, or 30-month programs available. Patient specifically asked about the area of [place], [place]. Patient denies any legal charges upcoming court dates.  Patient aware that LCSW will explore additional options for placement and will follow up once updates can be provided.  Patient reports an interest in being referred to [facility] in [place].  No other needs were reported at this time by patient.  LCSW will continue to follow and provide support to patient while on FBC unit.  LCSW contacted [facility] in [place] [phone] to inquire about bed availability. Per Admissions Coordinator, they will not have beds available for the next two weeks. Update will be provided to patient.   LCSW explore patient's interest in [facility] in [place], [place] and declined referral stating he was aware of the people running the agency and he would prefer not to have any dealings with them.   Patient requested to be sent to [facility] and reports he prefers not to be referred to [facility] Referral sent to [facility]. Patient aware that LCSW will continue search for placement.   LCSW will continue to follow and provide support to patient while on FBC unit.  Fernande Boyden, LCSW Clinical Social Worker [facility] Ph: [phone]

## 2024-01-25 NOTE — ED Notes (Signed)
 Pt sitting in dayroom eating dinner, watching television and interacting with peers. No acute distress noted. No concerns voiced. Informed pt to notify staff with any needs or assistance. Pt verbalized understanding and agreement. Will continue to monitor for safety.

## 2024-01-26 DIAGNOSIS — F1020 Alcohol dependence, uncomplicated: Secondary | ICD-10-CM | POA: Diagnosis not present

## 2024-01-26 DIAGNOSIS — A6002 Herpesviral infection of other male genital organs: Secondary | ICD-10-CM | POA: Diagnosis not present

## 2024-01-26 DIAGNOSIS — F411 Generalized anxiety disorder: Secondary | ICD-10-CM | POA: Diagnosis not present

## 2024-01-26 DIAGNOSIS — F331 Major depressive disorder, recurrent, moderate: Secondary | ICD-10-CM | POA: Diagnosis not present

## 2024-01-26 MED ORDER — LORAZEPAM 1 MG PO TABS
1.0000 mg | ORAL_TABLET | Freq: Once | ORAL | Status: AC
  Administered 2024-01-26: 1 mg via ORAL
  Filled 2024-01-26: qty 1

## 2024-01-26 MED ORDER — LORAZEPAM 0.5 MG PO TABS
0.5000 mg | ORAL_TABLET | Freq: Two times a day (BID) | ORAL | Status: AC
  Administered 2024-01-26 – 2024-01-27 (×3): 0.5 mg via ORAL
  Filled 2024-01-26 (×3): qty 1

## 2024-01-26 NOTE — ED Notes (Signed)
 Patient seen sitting in the day area interacting with peers. He is calm and cooperative without any distress noted. He is wearing personal clothing and reports feeling better today. He denies SIHI, AVH, and depression and reports anxiety 7. He reports eating better today with last BM today and reports sleeping okay and denies having nightmares. Staff will continue to monitor safety per protocol and for changes in condition.

## 2024-01-26 NOTE — Group Note (Signed)
 Group Topic: Social Support  Group Date: 01/26/2024 Start Time: 1030 End Time: 1100 Facilitators: Evelina Bucy, RN  Department: [facility]  Number of Participants: 4  Group Focus: check in, communication, relapse prevention, relaxation, and social skills Treatment Modality:  Psychoeducation Interventions utilized were group exercise, patient education, and support Purpose: increase insight and relapse prevention strategies  Name: Trevor [name] Date of Birth: 17-Apr-1981  MR: [id number]    Level of Participation: active Quality of Participation: attentive, cooperative, and initiates communication Interactions with others: gave feedback Mood/Affect: appropriate Triggers (if applicable):  Cognition: coherent/clear Progress: Gaining insight Response:  Plan: follow-up needed  Patients Problems:  Patient Active Problem List   Diagnosis Date Noted   Alcohol use disorder 01/23/2024   Hyponatremia 06/19/2022   Transaminitis 06/18/2022   GERD (gastroesophageal reflux disease) 06/18/2022   Hypokalemia 06/18/2022   Loss of consciousness (HCC) 06/18/2022   QT prolongation 06/18/2022   Chronic hepatitis C without hepatic coma (HCC) 06/28/2019   Alcohol abuse 06/28/2019   Psoriasis 06/07/2019   Suicidal ideation 06/07/2019   Withdrawal symptoms, alcohol, with delirium (HCC) 06/07/2019   Alcoholic hepatitis without ascites 06/05/2019   Alcohol withdrawal (HCC) 06/03/2019   Obesity (BMI 30-39.9) 01/09/2016   HSV-2 (herpes simplex virus 2) infection

## 2024-01-26 NOTE — Discharge Instructions (Addendum)
 Patient will be discharging  to [facility] on Tuesday 02/02/2024 at 10:30am with transportation provided by family. Location of facility is [address] [place], [place]. [phone]    [facility] [address], [place], [address] [phone] phone  New Patient Assessment/Therapy Walk-Ins:  Monday and Wednesday: 8 am until slots are full. Every 1st and 2nd Fridays of the month: 1 pm - 5 pm.  NO ASSESSMENT/THERAPY WALK-INS ON TUESDAYS OR THURSDAYS  New Patient Assessment/Medication Management Walk-Ins:  Monday - Friday:  8 am - 11 am.  For all walk-ins, we ask that you arrive by 7:30 am because patients will be seen in the order of arrival.  Availability is limited; therefore, you may not be seen on the same day that you walk-in.  Our goal is to serve and meet the needs of our community to the best of our [place] ability.  SUBSTANCE USE TREATMENT for Medicaid and State Funded/IPRS  [facility] [address], [place], [address] [phone] phone NOTE: [facility] is no longer offering IOP services.  Serves those who are low-income or have no insurance.  [facility] [address] [place], [place], [address] [phone] phone [phone] fax NOTE: Does have Substance Abuse-Intensive Outpatient Program [facility]) as well as transitional housing if eligible.  [facility] [address] [place], [place], [address] [phone] phone [phone] fax  [facility] [phone] [address] [place], [place], [address] [phone] phone [phone] fax  HALFWAY HOUSES:  [facility] [phone]  [employer].oxfordvacancies.com  12 STEP PROGRAMS:  Alcoholics Anonymous of [place] [employer]  Narcotics Anonymous of [place] [employer]  Al-Anon of [place], [place]  www.[place]alanon.org/find-meetings.html  Nar-Anon https://nar-anon.org/find-a-meetin  List of Residential placements:   [facility] in [place]: [phone]  [facility]: [phone]  [place], [place] [phone]: Male and male facility; 30-day program: (uninsured and Medicaid such as Laurena Bering, [place], [place], partners)  [facility]: [phone]; men and women's facility; 28 days; Can have Medicaid tailored plan Tour manager or Partners)  [facility]: [phone] Karoline Caldwell or Larita Fife; 28 day program; must be fully detox; tailored Medicaid or no insurance  [address] in [place], [place]; [phone]; 28 day all males program; no insurance accepted  [facility] Referral in [place]: Gabriel Rung [phone] (no insurance or Medicaid only); 90 days; outpatient services but provide housing in apartments downtown [place]  [facility] Admission: [phone]: Patient must complete phone screening for placement: [place], [place]; 6 month program; uninsured, Medicaid, and [employer].   [facility]: no insurance required; [phone]  [facility]: [phone]; Intake: Molly Maduro; Must fill out application online; Alecia Lemming Delay [phone] x [phone] [facility] in [place], [place]: [phone]; Admissions Coordinators Mr. Maurine Minister or Barron Alvine; 90 day program.  [facility]: [place], [place] [phone]; Co-Ed 9 month to a year program; Online application; Men entry fee is $500 (6-19months);  [employer]: [address] [place], [place] [address]; no fee or insurance required; minimum of 2 years; Highly structured; work based; Intake Coordinator is Thayer Ohm [phone]  [facility] in [place], [place]: [phone]; Fax number is [phone]; website: www.[facility].org; Requires 3-6 page autobiography; 2 year program (18 months and then 21month transitional housing);  Admission fee is $300; no insurance needed; work Automotive engineer in [place], [place]: [employer] Desk Staff: Danise Edge [phone]: They have a Men's Regenerations Program 6-38months. Free program; There is an initial $300 fee  however, they are willing to work with patients regarding that. Application is online.  [facility]: Admissions [phone] Doran Heater ext [phone]; Any 7-90 day program is out of pocket; 12 month program is free of charge; there is a $275 entry fee; Patient is responsible for own transportation

## 2024-01-26 NOTE — ED Notes (Signed)
 Pt in dayroom working on a puzzle. Pt in NAD

## 2024-01-26 NOTE — ED Notes (Signed)
 Patient asked for and was given 25mg  vistaril for anxiety.

## 2024-01-26 NOTE — Group Note (Signed)
 Group Topic: Recovery Basics  Group Date: 01/26/2024 Start Time: 1915 End Time: 2000 Facilitators: Rae Lips B  Department: [facility]  Number of Participants: 3  Group Focus: check in, clarity of thought, communication, community group, coping skills, daily focus, and relapse prevention Treatment Modality:  Individual Therapy and Leisure Development Interventions utilized were leisure development, patient education, problem solving, and support Purpose: express feelings and increase insight  Name: Trevor [name] Date of Birth: [birth year]  MR: [id number]    Level of Participation: active Quality of Participation: attentive, cooperative, initiates communication, motivated, offered feedback, and supportive Interactions with others: gave feedback Mood/Affect: appropriate, brightens with interaction, and positive Triggers (if applicable): He said he's trying to stay away from the places that he used to party at so it doesn't temp him to drink and party.  Cognition: coherent/clear, goal directed, and insightful Progress: Gaining insight Response: He's looking forward to moving to a longer care program after the 21 day stay to help him get back on his feet.  Plan: patient will be encouraged to go to groups.   Patients Problems:  Patient Active Problem List   Diagnosis Date Noted   Alcohol use disorder 01/23/2024   Hyponatremia 06/19/2022   Transaminitis 06/18/2022   GERD (gastroesophageal reflux disease) 06/18/2022   Hypokalemia 06/18/2022   Loss of consciousness (HCC) 06/18/2022   QT prolongation 06/18/2022   Chronic hepatitis C without hepatic coma (HCC) 06/28/2019   Alcohol abuse 06/28/2019   Psoriasis 06/07/2019   Suicidal ideation 06/07/2019   Withdrawal symptoms, alcohol, with delirium (HCC) 06/07/2019   Alcoholic hepatitis without ascites 06/05/2019   Alcohol withdrawal (HCC) 06/03/2019   Obesity (BMI 30-39.9) 01/09/2016   HSV-2 (herpes  simplex virus 2) infection

## 2024-01-26 NOTE — ED Notes (Signed)
 Patient is resting in bed without any distress noted. No s/s of discomfort. Staff will continue to monitor safety per protocol and for changes in condition.

## 2024-01-26 NOTE — ED Notes (Signed)
 Pt in dayroom eating snack and working on a puzzle. Pt in NAD.

## 2024-01-26 NOTE — ED Notes (Signed)
 Pt currently in dayroom in NAD. A&Ox4, ambulatory w/ steady gait. Denies SI/HI/AVH.

## 2024-01-26 NOTE — Discharge Planning (Signed)
 Patient was asked to complete phone screening with [facility] for possible consideration. Contact number provided and patient reports he will follow up with them shortly. Patient asked if he could still be considered for [facility] until bed is available. Patient aware that LCSW will follow up to see about referral process and will send referral as able. Patient will also be provided with their contact number for his continued follow up. No other needs were reported by the patient at this time. LCSW will continue to follow and provide support to patient while on FBC unit.   Fernande Boyden, LCSW Clinical Social Worker [facility] Ph: [phone]

## 2024-01-26 NOTE — ED Provider Notes (Signed)
 Behavioral Health Progress Note  Date and Time: 01/26/2024 11:19 AM Name: Trevor [name] MRN:  [id number]  Subjective:   Trevor [name]. Trevor [name] is a 43 yr old male who presented on 3/28 to [facility] requesting help for EtOH Abuse, he was admitted to [facility] on 3/29 for alcohol detox and rehab services. PPHx is significant for Depression, Anxiety, EtOH Abuse, and has been to Residential Rehab 4 times, and no history of Suicide Attempts, Self Injurious Behavior, or Psychiatric Hospitalizations. PMHx is significant for Withdrawal Seizures.   Patient seen in the milieu, no acute distress. He reports fair sleep and good appetite. Yesterday before dinner, he was experiencing a moment where he felt shakey but this improved shortly after. He reports improving diaphoresis and tremor. He denies adverse effects form Zoloft. He denies SI, HI, and AVH. He continues to want residential or a long term facility after discharging.  Diagnosis:  Final diagnoses:  Alcohol abuse  Moderate episode of recurrent major depressive disorder (HCC)  GAD (generalized anxiety disorder)   Past Psychiatric History:  Depression, Anxiety, EtOH Abuse, and has been to Residential Rehab 4 times, and no history of Suicide Attempts, Self Injurious Behavior, or Psychiatric Hospitalizations.    Past Medical History:  Depression, Anxiety, EtOH Abuse, and has been to Residential Rehab 4 times, and no history of Suicide Attempts, Self Injurious Behavior, or Psychiatric Hospitalizations.   Family History:  Mother- Skin Cancer Paternal Grandfather- Colon Cancer Paternal Grandmother and Grandfather- Lung Cancer  Family Psychiatric  History:  Brother- EtOH Abuse Maternal Grandfather and Grandmother- EtOH Abuse No Known Diagnosis' or Suicides  Social History:  Divorced, has a daughter. Recently fired from his job. Started drinking at age 43, longest period of sobriety 6 months, been to Residential Rehab 4 times.   Current Medications:   Current Facility-Administered Medications  Medication Dose Route Frequency Provider Last Rate Last Admin   acetaminophen (TYLENOL) tablet 650 mg  650 mg Oral Q6H PRN Marlou Sa, NP       alum & mag hydroxide-simeth (MAALOX/MYLANTA) 200-200-20 MG/5ML suspension 30 mL  30 mL Oral Q4H PRN Rayburn Go, Veronique M, NP       gabapentin (NEURONTIN) capsule 200 mg  200 mg Oral TID Miguel Rota, MD   200 mg at 01/26/24 0926   hydrOXYzine (ATARAX) tablet 25 mg  25 mg Oral Q6H PRN Miguel Rota, MD   25 mg at 01/25/24 2131   magnesium hydroxide (MILK OF MAGNESIA) suspension 30 mL  30 mL Oral Daily PRN Marlou Sa, NP   30 mL at 01/25/24 1309   multivitamin with minerals tablet 1 tablet  1 tablet Oral Daily Marlou Sa, NP   1 tablet at 01/26/24 0925   OLANZapine (ZYPREXA) injection 10 mg  10 mg Intramuscular TID PRN Marlou Sa, NP       OLANZapine (ZYPREXA) injection 5 mg  5 mg Intramuscular TID PRN Rayburn Go, Veronique M, NP       OLANZapine zydis (ZYPREXA) disintegrating tablet 5 mg  5 mg Oral TID PRN Rayburn Go, Veronique M, NP       ondansetron (ZOFRAN-ODT) disintegrating tablet 4 mg  4 mg Oral Q6H PRN Lauro Franklin, MD       sertraline (ZOLOFT) tablet 50 mg  50 mg Oral Daily Lauro Franklin, MD   50 mg at 01/26/24 4098   thiamine (VITAMIN B1) tablet 100 mg  100 mg Oral Daily Marlou Sa, NP   100 mg at 01/26/24 [phone]  traZODone (DESYREL) tablet 50 mg  50 mg Oral QHS PRN Marlou Sa, NP   50 mg at 01/25/24 2133   Current Outpatient Medications  Medication Sig Dispense Refill   famotidine (PEPCID) 20 MG tablet Take 20 mg by mouth 2 (two) times daily. (Patient not taking: Reported on 01/22/2024)     hydrOXYzine (ATARAX) 50 MG tablet Take 50 mg by mouth every 8 (eight) hours as needed for anxiety. (Patient not taking: Reported on 01/22/2024)     Multiple Vitamin (MULTIVITAMIN WITH MINERALS) TABS tablet Take 1 tablet by mouth daily.      thiamine (VITAMIN B1) 100 MG tablet Take 1 tablet (100 mg total) by mouth daily. (Patient not taking: Reported on 01/22/2024) 30 tablet 0   traZODone (DESYREL) 150 MG tablet Take 150 mg by mouth at bedtime. (Patient not taking: Reported on 01/22/2024)      Labs  Lab Results:  Admission on 01/22/2024, Discharged on 01/23/2024  Component Date Value Ref Range Status   WBC 01/22/2024 6.8  4.0 - 10.5 K/uL Final   RBC 01/22/2024 5.22  4.22 - 5.81 MIL/uL Final   Hemoglobin 01/22/2024 17.2 (H)  13.0 - 17.0 g/dL Final   HCT 16/07/9603 47.8  39.0 - 52.0 % Final   MCV 01/22/2024 91.6  80.0 - 100.0 fL Final   MCH 01/22/2024 33.0  26.0 - 34.0 pg Final   MCHC 01/22/2024 36.0  30.0 - 36.0 g/dL Final   RDW 54/06/8118 12.1  11.5 - 15.5 % Final   Platelets 01/22/2024 319  150 - 400 K/uL Final   nRBC 01/22/2024 0.0  0.0 - 0.2 % Final   Neutrophils Relative % 01/22/2024 70  % Final   Neutro Abs 01/22/2024 4.8  1.7 - 7.7 K/uL Final   Lymphocytes Relative 01/22/2024 25  % Final   Lymphs Abs 01/22/2024 1.7  0.7 - 4.0 K/uL Final   Monocytes Relative 01/22/2024 4  % Final   Monocytes Absolute 01/22/2024 0.3  0.1 - 1.0 K/uL Final   Eosinophils Relative 01/22/2024 0  % Final   Eosinophils Absolute 01/22/2024 0.0  0.0 - 0.5 K/uL Final   Basophils Relative 01/22/2024 1  % Final   Basophils Absolute 01/22/2024 0.1  0.0 - 0.1 K/uL Final   Immature Granulocytes 01/22/2024 0  % Final   Abs Immature Granulocytes 01/22/2024 0.02  0.00 - 0.07 K/uL Final   Performed at [facility], [address] [place], [place] [address]   Sodium 01/22/2024 138  135 - 145 mmol/L Final   Potassium 01/22/2024 3.9  3.5 - 5.1 mmol/L Final   Chloride 01/22/2024 99  98 - 111 mmol/L Final   CO2 01/22/2024 25  22 - 32 mmol/L Final   Glucose, Bld 01/22/2024 99  70 - 99 mg/dL Final   Glucose reference range applies only to samples taken after fasting for at least 8 hours.   BUN 01/22/2024 13  6 - 20 mg/dL Final   Creatinine, Ser  01/22/2024 0.83  0.61 - 1.24 mg/dL Final   Calcium 95/62/1308 8.9  8.9 - 10.3 mg/dL Final   Total Protein 65/78/4696 7.5  6.5 - 8.1 g/dL Final   Albumin 29/52/8413 4.6  3.5 - 5.0 g/dL Final   AST 24/40/1027 44 (H)  15 - 41 U/L Final   ALT 01/22/2024 46 (H)  0 - 44 U/L Final   Alkaline Phosphatase 01/22/2024 60  38 - 126 U/L Final   Total Bilirubin 01/22/2024 0.8  0.0 -  1.2 mg/dL Final   GFR, Estimated 01/22/2024 >60  >60 mL/min Final   Comment: (NOTE) Calculated using the CKD-EPI Creatinine Equation (2021)    Anion gap 01/22/2024 14  5 - 15 Final   Performed at [facility], [address] [place], [place] [address]   Hgb A1c MFr Bld 01/22/2024 5.0  4.8 - 5.6 % Final   Comment: (NOTE) Pre diabetes:          5.7%-6.4%  Diabetes:              >6.4%  Glycemic control for   <7.0% adults with diabetes    Mean Plasma Glucose 01/22/2024 96.8  mg/dL Final   Performed at [facility], [address] [place], [place] [address]   Magnesium 01/22/2024 1.9  1.7 - 2.4 mg/dL Final   Performed at [facility], [address], [place], [place] [address]   Alcohol, Ethyl (B) 01/22/2024 109 (H)  <10 mg/dL Final   Comment: (NOTE) Lowest detectable limit for serum alcohol is 10 mg/dL.  For medical purposes only. Performed at [facility], [address] [place], [place] [address]    Cholesterol 01/22/2024 202 (H)  0 - 200 mg/dL Final   Triglycerides 40/07/2724 65  <150 mg/dL Final   HDL 36/64/4034 84  >40 mg/dL Final   Total CHOL/HDL Ratio 01/22/2024 2.4  RATIO Final   VLDL 01/22/2024 13  0 - 40 mg/dL Final   LDL Cholesterol 01/22/2024 105 (H)  0 - 99 mg/dL Final   Comment:        Total Cholesterol/HDL:CHD Risk Coronary Heart Disease Risk Table                     Men   Women  1/2 Average Risk   3.4   3.3  Average Risk       5.0   4.4  2 X Average Risk   9.6   7.1  3 X Average Risk  23.4   11.0        Use the calculated Patient Ratio above and the CHD Risk  Table to determine the patient's CHD Risk.        ATP III CLASSIFICATION (LDL):  <100     mg/dL   Optimal  742-595  mg/dL   Near or Above                    Optimal  130-159  mg/dL   Borderline  638-756  mg/dL   High  >433     mg/dL   Very High Performed at [facility], [address], [place], [place] [address]    TSH 01/22/2024 0.458  0.350 - 4.500 uIU/mL Final   Comment: Performed by a 3rd Generation assay with a functional sensitivity of <=0.01 uIU/mL. Performed at [facility], [address] [place], [place] [address]    RPR Ser Ql 01/22/2024 NON REACTIVE  NON REACTIVE Final   Performed at [facility], [address] [place], [place] [address]   Color, Urine 01/22/2024 YELLOW  YELLOW Final   APPearance 01/22/2024 CLEAR  CLEAR Final   Specific Gravity, Urine 01/22/2024 1.020  1.005 - 1.030 Final   pH 01/22/2024 5.0  5.0 - 8.0 Final   Glucose, UA 01/22/2024 NEGATIVE  NEGATIVE mg/dL Final   Hgb urine dipstick 01/22/2024 NEGATIVE  NEGATIVE Final   Bilirubin Urine 01/22/2024 NEGATIVE  NEGATIVE Final   Ketones, ur 01/22/2024 5 (A)  NEGATIVE mg/dL Final   Protein, ur 40/98/1191 NEGATIVE  NEGATIVE mg/dL Final   Nitrite 47/82/9562 NEGATIVE  NEGATIVE Final   Leukocytes,Ua 01/22/2024 NEGATIVE  NEGATIVE Final   Performed at [facility], [address], [place], [place] [address]   POC Amphetamine UR 01/22/2024 None Detected   Final   POC Secobarbital (BAR) 01/22/2024 None Detected   Final   POC Buprenorphine (BUP) 01/22/2024 None Detected   Final   POC Oxazepam (BZO) 01/22/2024 None Detected   Final   POC Cocaine UR 01/22/2024 None Detected   Final   POC Methamphetamine UR 01/22/2024 None Detected   Final   POC Morphine 01/22/2024 None Detected   Final   POC Methadone UR 01/22/2024 None Detected   Final   POC Oxycodone UR 01/22/2024 None Detected   Final   POC Marijuana UR 01/22/2024 None Detected   Final  Admission on 12/24/2023, Discharged on  12/25/2023  Component Date Value Ref Range Status   Sodium 12/24/2023 144  135 - 145 mmol/L Final   Potassium 12/24/2023 3.8  3.5 - 5.1 mmol/L Final   Chloride 12/24/2023 107  98 - 111 mmol/L Final   CO2 12/24/2023 26  22 - 32 mmol/L Final   Glucose, Bld 12/24/2023 96  70 - 99 mg/dL Final   Glucose reference range applies only to samples taken after fasting for at least 8 hours.   BUN 12/24/2023 11  6 - 20 mg/dL Final   Creatinine, Ser 12/24/2023 0.62  0.61 - 1.24 mg/dL Final   Calcium 57/84/6962 8.6 (L)  8.9 - 10.3 mg/dL Final   Total Protein 95/28/4132 7.8  6.5 - 8.1 g/dL Final   Albumin 44/10/270 4.3  3.5 - 5.0 g/dL Final   AST 53/66/4403 34  15 - 41 U/L Final   ALT 12/24/2023 27  0 - 44 U/L Final   Alkaline Phosphatase 12/24/2023 56  38 - 126 U/L Final   Total Bilirubin 12/24/2023 0.4  0.0 - 1.2 mg/dL Final   GFR, Estimated 12/24/2023 >60  >60 mL/min Final   Comment: (NOTE) Calculated using the CKD-EPI Creatinine Equation (2021)    Anion gap 12/24/2023 11  5 - 15 Final   Performed at [facility], [address], [place], [place] [address]   Alcohol, Ethyl (B) 12/24/2023 436 (HH)  <10 mg/dL Final   Comment: CRITICAL RESULT CALLED TO, READ BACK BY AND VERIFIED WITH Barnett Applebaum, RN 12/25/23 0050 BY K. DAVIS (NOTE) Lowest detectable limit for serum alcohol is 10 mg/dL.  For medical purposes only. Performed at [facility] [address], [place], [place] [address]    Opiates 12/25/2023 NONE DETECTED  NONE DETECTED Final   Cocaine 12/25/2023 NONE DETECTED  NONE DETECTED Final   Benzodiazepines 12/25/2023 NONE DETECTED  NONE DETECTED Final   Amphetamines 12/25/2023 NONE DETECTED  NONE DETECTED Final   Tetrahydrocannabinol 12/25/2023 NONE DETECTED  NONE DETECTED Final   Barbiturates 12/25/2023 NONE DETECTED  NONE DETECTED Final   Comment: (NOTE) DRUG SCREEN FOR MEDICAL PURPOSES ONLY.  IF CONFIRMATION IS NEEDED FOR ANY PURPOSE, NOTIFY  LAB WITHIN 5 DAYS.  LOWEST DETECTABLE LIMITS FOR URINE DRUG SCREEN Drug Class                     Cutoff (ng/mL) Amphetamine and metabolites    1000 Barbiturate and metabolites    200 Benzodiazepine  200 Opiates and metabolites        300 Cocaine and metabolites        300 THC                            50 Performed at [facility], [address] [place], [place] [address]    WBC 12/24/2023 5.1  4.0 - 10.5 K/uL Final   RBC 12/24/2023 4.78  4.22 - 5.81 MIL/uL Final   Hemoglobin 12/24/2023 16.0  13.0 - 17.0 g/dL Final   HCT 60/45/4098 45.7  39.0 - 52.0 % Final   MCV 12/24/2023 95.6  80.0 - 100.0 fL Final   MCH 12/24/2023 33.5  26.0 - 34.0 pg Final   MCHC 12/24/2023 35.0  30.0 - 36.0 g/dL Final   RDW 11/91/4782 12.8  11.5 - 15.5 % Final   Platelets 12/24/2023 292  150 - 400 K/uL Final   nRBC 12/24/2023 0.0  0.0 - 0.2 % Final   Neutrophils Relative % 12/24/2023 61  % Final   Neutro Abs 12/24/2023 3.0  1.7 - 7.7 K/uL Final   Lymphocytes Relative 12/24/2023 31  % Final   Lymphs Abs 12/24/2023 1.6  0.7 - 4.0 K/uL Final   Monocytes Relative 12/24/2023 8  % Final   Monocytes Absolute 12/24/2023 0.4  0.1 - 1.0 K/uL Final   Eosinophils Relative 12/24/2023 0  % Final   Eosinophils Absolute 12/24/2023 0.0  0.0 - 0.5 K/uL Final   Basophils Relative 12/24/2023 0  % Final   Basophils Absolute 12/24/2023 0.0  0.0 - 0.1 K/uL Final   Immature Granulocytes 12/24/2023 0  % Final   Abs Immature Granulocytes 12/24/2023 0.02  0.00 - 0.07 K/uL Final   Performed at [facility], [address], [place], [place] [address]   Lipase 12/24/2023 41  11 - 51 U/L Final   Performed at [facility], [address], [place], [place] [address]   Color, Urine 12/25/2023 YELLOW  YELLOW Final   APPearance 12/25/2023 HAZY (A)  CLEAR Final   Specific Gravity, Urine 12/25/2023 1.010  1.005 - 1.030 Final   pH 12/25/2023 7.0  5.0 - 8.0 Final    Glucose, UA 12/25/2023 NEGATIVE  NEGATIVE mg/dL Final   Hgb urine dipstick 12/25/2023 NEGATIVE  NEGATIVE Final   Bilirubin Urine 12/25/2023 NEGATIVE  NEGATIVE Final   Ketones, ur 12/25/2023 NEGATIVE  NEGATIVE mg/dL Final   Protein, ur 78/46/9629 NEGATIVE  NEGATIVE mg/dL Final   Nitrite 52/84/1324 NEGATIVE  NEGATIVE Final   Leukocytes,Ua 12/25/2023 NEGATIVE  NEGATIVE Final   Performed at [facility], [address] [place], [place] [address]  Admission on 09/23/2023, Discharged on 09/29/2023  Component Date Value Ref Range Status   WBC 09/23/2023 5.2  4.0 - 10.5 K/uL Final   RBC 09/23/2023 5.10  4.22 - 5.81 MIL/uL Final   Hemoglobin 09/23/2023 16.9  13.0 - 17.0 g/dL Final   HCT 72/53/6644 46.5  39.0 - 52.0 % Final   MCV 09/23/2023 91.2  80.0 - 100.0 fL Final   MCH 09/23/2023 33.1  26.0 - 34.0 pg Final   MCHC 09/23/2023 36.3 (H)  30.0 - 36.0 g/dL Final   RDW 03/47/4259 12.9  11.5 - 15.5 % Final   Platelets 09/23/2023 185  150 - 400 K/uL Final   nRBC 09/23/2023 0.0  0.0 - 0.2 % Final   Performed at [facility], [address] [place], [place] [address]   Sodium 09/23/2023 139  135 - 145 mmol/L Final   Potassium 09/23/2023 3.5  3.5 - 5.1 mmol/L Final   Chloride 09/23/2023 99  98 - 111 mmol/L Final   CO2 09/23/2023 22  22 - 32 mmol/L Final   Glucose, Bld 09/23/2023 169 (H)  70 - 99 mg/dL Final   Glucose reference range applies only to samples taken after fasting for at least 8 hours.   BUN 09/23/2023 6  6 - 20 mg/dL Final   Creatinine, Ser 09/23/2023 0.71  0.61 - 1.24 mg/dL Final   Calcium 60/45/4098 8.4 (L)  8.9 - 10.3 mg/dL Final   Total Protein 11/91/4782 6.9  6.5 - 8.1 g/dL Final   Albumin 95/62/1308 4.0  3.5 - 5.0 g/dL Final   AST 65/78/4696 135 (H)  15 - 41 U/L Final   ALT 09/23/2023 76 (H)  0 - 44 U/L Final   Alkaline Phosphatase 09/23/2023 58  38 - 126 U/L Final   Total Bilirubin 09/23/2023 0.9  <1.2 mg/dL Final   GFR, Estimated 09/23/2023 >60  >60  mL/min Final   Comment: (NOTE) Calculated using the CKD-EPI Creatinine Equation (2021)    Anion gap 09/23/2023 18 (H)  5 - 15 Final   Performed at [facility], [address] [place], [place] [address]   Troponin I (High Sensitivity) 09/23/2023 14  <18 ng/L Final   Comment: (NOTE) Elevated high sensitivity troponin I (hsTnI) values and significant  changes across serial measurements may suggest ACS but many other  chronic and acute conditions are known to elevate hsTnI results.  Refer to the "Links" section for chest pain algorithms and additional  guidance. Performed at [facility], [address], [place], [place] [address]    Alcohol, Ethyl (B) 09/23/2023 412 (HH)  <10 mg/dL Final   Comment: CRITICAL RESULT CALLED TO, READ BACK BY AND VERIFIED WITH B. OSORIO, RN AT [phone] 11.27.24 JLASIGAN (NOTE) Lowest detectable limit for serum alcohol is 10 mg/dL.  For medical purposes only. Performed at [facility], [address] [place], [place] [address]    Lipase 09/23/2023 86 (H)  11 - 51 U/L Final   Performed at [facility], [address], [place], [place] [address]   Troponin I (High Sensitivity) 09/23/2023 19 (H)  <18 ng/L Final   Comment: (NOTE) Elevated high sensitivity troponin I (hsTnI) values and significant  changes across serial measurements may suggest ACS but many other  chronic and acute conditions are known to elevate hsTnI results.  Refer to the "Links" section for chest pain algorithms and additional  guidance. Performed at [facility], [address] [place], [place] [address]    Magnesium 09/23/2023 1.8  1.7 - 2.4 mg/dL Final   Performed at [facility], [address] [place], [place] [address]   Phosphorus 09/23/2023 2.2 (L)  2.5 - 4.6 mg/dL Final   Performed at [facility], [address], [place], [place] [address]   Opiates 09/23/2023 NONE DETECTED  NONE DETECTED Final   Cocaine 09/23/2023 NONE DETECTED   NONE DETECTED Final   Benzodiazepines 09/23/2023 NONE DETECTED  NONE DETECTED Final   Amphetamines 09/23/2023 NONE DETECTED  NONE DETECTED Final   Tetrahydrocannabinol 09/23/2023 NONE DETECTED  NONE DETECTED Final   Barbiturates 09/23/2023 NONE DETECTED  NONE DETECTED Final   Comment: (NOTE) DRUG SCREEN FOR MEDICAL PURPOSES ONLY.  IF CONFIRMATION  IS NEEDED FOR ANY PURPOSE, NOTIFY LAB WITHIN 5 DAYS.  LOWEST DETECTABLE LIMITS FOR URINE DRUG SCREEN Drug Class                     Cutoff (ng/mL) Amphetamine and metabolites    1000 Barbiturate and metabolites    200 Benzodiazepine                 200 Opiates and metabolites        300 Cocaine and metabolites        300 THC                            50 Performed at [facility], [address] [place], [place] [address]    HIV Screen 4th Generation wRfx 09/23/2023 Non Reactive  Non Reactive Final   Performed at [facility], [address] [place], [place] [address]   Phosphorus 09/24/2023 3.1  2.5 - 4.6 mg/dL Final   Performed at [facility], [address] [place], [place] [address]   Lipase 09/24/2023 92 (H)  11 - 51 U/L Final   Performed at [facility], [address] [place], [place] [address]   Sodium 09/24/2023 130 (L)  135 - 145 mmol/L Final   DELTA CHECK NOTED   Potassium 09/24/2023 3.8  3.5 - 5.1 mmol/L Final   Chloride 09/24/2023 98  98 - 111 mmol/L Final   CO2 09/24/2023 24  22 - 32 mmol/L Final   Glucose, Bld 09/24/2023 94  70 - 99 mg/dL Final   Glucose reference range applies only to samples taken after fasting for at least 8 hours.   BUN 09/24/2023 9  6 - 20 mg/dL Final   Creatinine, Ser 09/24/2023 0.63  0.61 - 1.24 mg/dL Final   Calcium 40/34/7425 8.5 (L)  8.9 - 10.3 mg/dL Final   Total Protein 95/63/8756 5.6 (L)  6.5 - 8.1 g/dL Final   Albumin 43/32/9518 3.2 (L)  3.5 - 5.0 g/dL Final   AST 84/16/6063 78 (H)  15 - 41 U/L Final   ALT 09/24/2023 54 (H)  0 - 44 U/L Final   Alkaline  Phosphatase 09/24/2023 42  38 - 126 U/L Final   Total Bilirubin 09/24/2023 1.2 (H)  <1.2 mg/dL Final   GFR, Estimated 09/24/2023 >60  >60 mL/min Final   Comment: (NOTE) Calculated using the CKD-EPI Creatinine Equation (2021)    Anion gap 09/24/2023 8  5 - 15 Final   Performed at [facility], [address] [place], [place] [address]   WBC 09/24/2023 4.1  4.0 - 10.5 K/uL Final   RBC 09/24/2023 3.97 (L)  4.22 - 5.81 MIL/uL Final   Hemoglobin 09/24/2023 13.2  13.0 - 17.0 g/dL Final   HCT 09/32/3557 37.2 (L)  39.0 - 52.0 % Final   MCV 09/24/2023 93.7  80.0 - 100.0 fL Final   MCH 09/24/2023 33.2  26.0 - 34.0 pg Final   MCHC 09/24/2023 35.5  30.0 - 36.0 g/dL Final   RDW 32/20/2542 12.8  11.5 - 15.5 % Final   Platelets 09/24/2023 130 (L)  150 - 400 K/uL Final   nRBC 09/24/2023 0.0  0.0 - 0.2 % Final   Neutrophils Relative % 09/24/2023 60  % Final   Neutro Abs 09/24/2023 2.5  1.7 - 7.7 K/uL Final   Lymphocytes Relative 09/24/2023 27  % Final  Lymphs Abs 09/24/2023 1.1  0.7 - 4.0 K/uL Final   Monocytes Relative 09/24/2023 11  % Final   Monocytes Absolute 09/24/2023 0.4  0.1 - 1.0 K/uL Final   Eosinophils Relative 09/24/2023 1  % Final   Eosinophils Absolute 09/24/2023 0.0  0.0 - 0.5 K/uL Final   Basophils Relative 09/24/2023 1  % Final   Basophils Absolute 09/24/2023 0.0  0.0 - 0.1 K/uL Final   Immature Granulocytes 09/24/2023 0  % Final   Abs Immature Granulocytes 09/24/2023 0.01  0.00 - 0.07 K/uL Final   Performed at [facility], [address] [place], [place] [address]   Magnesium 09/24/2023 1.6 (L)  1.7 - 2.4 mg/dL Final   Performed at [facility], [address], [place], [place] [address]   WBC 09/25/2023 5.1  4.0 - 10.5 K/uL Final   RBC 09/25/2023 4.20 (L)  4.22 - 5.81 MIL/uL Final   Hemoglobin 09/25/2023 13.5  13.0 - 17.0 g/dL Final   HCT 13/05/6577 40.0  39.0 - 52.0 % Final   MCV 09/25/2023 95.2  80.0 - 100.0 fL Final   MCH 09/25/2023 32.1  26.0 - 34.0  pg Final   MCHC 09/25/2023 33.8  30.0 - 36.0 g/dL Final   RDW 46/96/2952 13.1  11.5 - 15.5 % Final   Platelets 09/25/2023 138 (L)  150 - 400 K/uL Final   REPEATED TO VERIFY   nRBC 09/25/2023 0.0  0.0 - 0.2 % Final   Performed at [facility], [address], [place], [place] [address]   Sodium 09/25/2023 134 (L)  135 - 145 mmol/L Final   Potassium 09/25/2023 3.9  3.5 - 5.1 mmol/L Final   Chloride 09/25/2023 102  98 - 111 mmol/L Final   CO2 09/25/2023 22  22 - 32 mmol/L Final   Glucose, Bld 09/25/2023 92  70 - 99 mg/dL Final   Glucose reference range applies only to samples taken after fasting for at least 8 hours.   BUN 09/25/2023 12  6 - 20 mg/dL Final   Creatinine, Ser 09/25/2023 0.61  0.61 - 1.24 mg/dL Final   Calcium 44/10/270 9.0  8.9 - 10.3 mg/dL Final   GFR, Estimated 09/25/2023 >60  >60 mL/min Final   Comment: (NOTE) Calculated using the CKD-EPI Creatinine Equation (2021)    Anion gap 09/25/2023 10  5 - 15 Final   Performed at [facility], [address], [place], [place] [address]   Phosphorus 09/25/2023 5.5 (H)  2.5 - 4.6 mg/dL Final   Performed at [facility], [address] [place], [place] [address]   Magnesium 09/25/2023 2.2  1.7 - 2.4 mg/dL Final   Performed at [facility], [address], [place], [place] [address]   Glucose-Capillary 09/25/2023 88  70 - 99 mg/dL Final   Glucose reference range applies only to samples taken after fasting for at least 8 hours.   Glucose-Capillary 09/25/2023 101 (H)  70 - 99 mg/dL Final   Glucose reference range applies only to samples taken after fasting for at least 8 hours.   Glucose-Capillary 09/25/2023 109 (H)  70 - 99 mg/dL Final   Glucose reference range applies only to samples taken after fasting for at least 8 hours.   WBC 09/26/2023 5.2  4.0 - 10.5 K/uL Final   RBC 09/26/2023 4.26  4.22 - 5.81 MIL/uL Final   Hemoglobin 09/26/2023 14.0  13.0 - 17.0 g/dL Final  HCT 09/26/2023 40.3  39.0 - 52.0 %  Final   MCV 09/26/2023 94.6  80.0 - 100.0 fL Final   MCH 09/26/2023 32.9  26.0 - 34.0 pg Final   MCHC 09/26/2023 34.7  30.0 - 36.0 g/dL Final   RDW 40/34/7425 13.1  11.5 - 15.5 % Final   Platelets 09/26/2023 143 (L)  150 - 400 K/uL Final   nRBC 09/26/2023 0.0  0.0 - 0.2 % Final   Performed at [facility], [address] [place], [place] [address]   Sodium 09/26/2023 134 (L)  135 - 145 mmol/L Final   Potassium 09/26/2023 4.4  3.5 - 5.1 mmol/L Final   Chloride 09/26/2023 106  98 - 111 mmol/L Final   CO2 09/26/2023 21 (L)  22 - 32 mmol/L Final   Glucose, Bld 09/26/2023 104 (H)  70 - 99 mg/dL Final   Glucose reference range applies only to samples taken after fasting for at least 8 hours.   BUN 09/26/2023 19  6 - 20 mg/dL Final   Creatinine, Ser 09/26/2023 0.60 (L)  0.61 - 1.24 mg/dL Final   Calcium 75/64/3329 8.6 (L)  8.9 - 10.3 mg/dL Final   GFR, Estimated 09/26/2023 >60  >60 mL/min Final   Comment: (NOTE) Calculated using the CKD-EPI Creatinine Equation (2021)    Anion gap 09/26/2023 7  5 - 15 Final   Performed at [facility], [address], [place], [place] [address]   Phosphorus 09/26/2023 3.9  2.5 - 4.6 mg/dL Final   Performed at [facility], [address], [place], [place] [address]   Magnesium 09/26/2023 2.1  1.7 - 2.4 mg/dL Final   Performed at [facility], [address], [place], [place] [address]   WBC 09/27/2023 5.4  4.0 - 10.5 K/uL Final   RBC 09/27/2023 4.42  4.22 - 5.81 MIL/uL Final   Hemoglobin 09/27/2023 14.5  13.0 - 17.0 g/dL Final   HCT 10/93/2355 42.1  39.0 - 52.0 % Final   MCV 09/27/2023 95.2  80.0 - 100.0 fL Final   MCH 09/27/2023 32.8  26.0 - 34.0 pg Final   MCHC 09/27/2023 34.4  30.0 - 36.0 g/dL Final   RDW 73/22/0254 13.0  11.5 - 15.5 % Final   Platelets 09/27/2023 163  150 - 400 K/uL Final   nRBC 09/27/2023 0.0  0.0 - 0.2 % Final   Performed at [facility], [address], [place], [place] [address]   Sodium 09/27/2023  136  135 - 145 mmol/L Final   Potassium 09/27/2023 4.4  3.5 - 5.1 mmol/L Final   Chloride 09/27/2023 104  98 - 111 mmol/L Final   CO2 09/27/2023 25  22 - 32 mmol/L Final   Glucose, Bld 09/27/2023 96  70 - 99 mg/dL Final   Glucose reference range applies only to samples taken after fasting for at least 8 hours.   BUN 09/27/2023 11  6 - 20 mg/dL Final   Creatinine, Ser 09/27/2023 0.75  0.61 - 1.24 mg/dL Final   Calcium 37/62/8315 9.3  8.9 - 10.3 mg/dL Final   GFR, Estimated 09/27/2023 >60  >60 mL/min Final   Comment: (NOTE) Calculated using the CKD-EPI Creatinine Equation (2021)    Anion gap 09/27/2023 7  5 - 15 Final   Performed at [facility], [address], [place], [place] [address]   Phosphorus 09/27/2023 4.5  2.5 - 4.6 mg/dL Final   Performed at  [facility], [address], [place], [place] [address]   Magnesium 09/27/2023 2.2  1.7 - 2.4 mg/dL Final   Performed at [facility], [address] [place], [place] [address]   Glucose-Capillary 09/26/2023 98  70 - 99 mg/dL Final   Glucose reference range applies only to samples taken after fasting for at least 8 hours.   WBC 09/28/2023 5.6  4.0 - 10.5 K/uL Final   RBC 09/28/2023 4.35  4.22 - 5.81 MIL/uL Final   Hemoglobin 09/28/2023 14.5  13.0 - 17.0 g/dL Final   HCT 65/78/4696 42.1  39.0 - 52.0 % Final   MCV 09/28/2023 96.8  80.0 - 100.0 fL Final   MCH 09/28/2023 33.3  26.0 - 34.0 pg Final   MCHC 09/28/2023 34.4  30.0 - 36.0 g/dL Final   RDW 29/52/8413 12.9  11.5 - 15.5 % Final   Platelets 09/28/2023 182  150 - 400 K/uL Final   nRBC 09/28/2023 0.0  0.0 - 0.2 % Final   Performed at [facility], [address] [place], [place] [address]   Sodium 09/28/2023 135  135 - 145 mmol/L Final   Potassium 09/28/2023 4.2  3.5 - 5.1 mmol/L Final   Chloride 09/28/2023 104  98 - 111 mmol/L Final   CO2 09/28/2023 24  22 - 32 mmol/L Final   Glucose, Bld 09/28/2023 102 (H)  70 - 99 mg/dL Final   Glucose reference range  applies only to samples taken after fasting for at least 8 hours.   BUN 09/28/2023 16  6 - 20 mg/dL Final   Creatinine, Ser 09/28/2023 0.75  0.61 - 1.24 mg/dL Final   Calcium 02/72/5366 8.8 (L)  8.9 - 10.3 mg/dL Final   GFR, Estimated 09/28/2023 >60  >60 mL/min Final   Comment: (NOTE) Calculated using the CKD-EPI Creatinine Equation (2021)    Anion gap 09/28/2023 7  5 - 15 Final   Performed at [facility], [address], [place], [place] [address]   Phosphorus 09/28/2023 3.9  2.5 - 4.6 mg/dL Final   Performed at [facility], [address], [place], [place] [address]   Magnesium 09/28/2023 1.9  1.7 - 2.4 mg/dL Final   Performed at [facility], [address], [place], [place] [address]   Total Protein 09/28/2023 6.6  6.5 - 8.1 g/dL Final   Albumin 56/43/3295 3.7  3.5 - 5.0 g/dL Final   AST 18/84/1660 28  15 - 41 U/L Final   ALT 09/28/2023 38  0 - 44 U/L Final   Alkaline Phosphatase 09/28/2023 40  38 - 126 U/L Final   Total Bilirubin 09/28/2023 0.7  <1.2 mg/dL Final   Bilirubin, Direct 09/28/2023 0.1  0.0 - 0.2 mg/dL Final   Indirect Bilirubin 09/28/2023 0.6  0.3 - 0.9 mg/dL Final   Performed at [facility], [address] [place], [place] [address]   Lipase 09/28/2023 84 (H)  11 - 51 U/L Final   Performed at [facility], [address] [place], [place] [address]    Blood Alcohol level:  Lab Results  Component Value Date   ETH 109 (H) 01/22/2024   ETH 436 (HH) 12/24/2023    Metabolic Disorder Labs: Lab Results  Component Value Date   HGBA1C 5.0 01/22/2024   MPG 96.8 01/22/2024   MPG 108 11/07/2014   No results found for: "PROLACTIN" Lab Results  Component Value Date  CHOL 202 (H) 01/22/2024   TRIG 65 01/22/2024   HDL 84 01/22/2024   CHOLHDL 2.4 01/22/2024   VLDL 13 01/22/2024   LDLCALC 105 (H) 01/22/2024   LDLCALC 137 (H) 01/09/2016    Therapeutic Lab Levels: No results found for: "LITHIUM" No results found for:  "VALPROATE" No results found for: "CBMZ"  Physical Findings   AUDIT    Flowsheet Row ED from 01/23/2024 in [facility]  Alcohol Use Disorder Identification Test Final Score (AUDIT) 36      GAD-7    Flowsheet Row Counselor from 10/22/2023 in [facility]  Total GAD-7 Score 8      PHQ2-9    Flowsheet Row ED from 01/23/2024 in [facility] ED from 01/22/2024 in [facility] Counselor from 10/22/2023 in [facility] Office Visit from 08/22/2016 in [facility] Visit from 01/09/2016 in [facility]  PHQ-2 Total Score 3 4 0 0 0  PHQ-9 Total Score 14 12 -- -- --      Flowsheet Row ED from 01/23/2024 in [facility] ED from 01/22/2024 in [facility] ED from 12/24/2023 in [facility] Emergency Department at [facility]  C-SSRS RISK CATEGORY Low Risk Error: Q3, 4, or 5 should not be populated when Q2 is No No Risk        Musculoskeletal  Strength & Muscle Tone: within normal limits Gait & Station: normal Patient leans: N/A  Psychiatric Specialty Exam  Presentation  General Appearance:  Appropriate for Environment; Fairly Groomed  Eye Contact: Fair  Speech: Clear and Coherent; Normal Rate  Speech Volume: Normal  Handedness: Right   Mood and Affect  Mood: Euthymic  Affect: Congruent; Appropriate   Thought Process  Thought Processes: Coherent  Descriptions of Associations:Intact  Orientation:Full (Time, Place and Person)  Thought Content:Logical  Diagnosis of Schizophrenia or Schizoaffective disorder in past: No    Hallucinations:Hallucinations: None  Ideas of Reference:None  Suicidal Thoughts:Suicidal Thoughts: No  Homicidal Thoughts:Homicidal Thoughts:  No   Sensorium  Memory: Remote Good  Judgment: Fair  Insight: Fair   Art therapist  Concentration: Good  Attention Span: Good  Recall: Good  Fund of Knowledge: Good  Language: Good   Psychomotor Activity  Psychomotor Activity: Psychomotor Activity: Normal   Assets  Assets: Communication Skills; Resilience; Social Support   Sleep  Sleep: Sleep: Fair   Physical Exam  Physical Exam Vitals and nursing note reviewed.  Constitutional:      General: He is not in acute distress.    Appearance: Normal appearance. He is normal weight. He is not ill-appearing or toxic-appearing.  HENT:     Head: Normocephalic and atraumatic.  Pulmonary:     Effort: Pulmonary effort is normal.  Musculoskeletal:        General: Normal range of motion.  Neurological:     General: No focal deficit present.     Mental Status: He is alert.    Review of Systems  Respiratory:  Negative for cough and shortness of breath.   Cardiovascular:  Negative for chest pain.  Gastrointestinal:  Negative for abdominal pain, constipation, diarrhea, nausea and vomiting.  Neurological:  Positive for tremors. Negative for dizziness, weakness and headaches.  Psychiatric/Behavioral:  Negative for hallucinations and suicidal ideas. The patient is not nervous/anxious.    Blood pressure 114/73, pulse  84, temperature 97.8 F (36.6 C), temperature source Oral, resp. rate 19, SpO2 99%. There is no height or weight on file to calculate BMI.  Treatment Plan Summary: Daily contact with patient to assess and evaluate symptoms and progress in treatment and Medication management  Trevor [name]. Schlitt is a 43 yr old male who presented on 3/28 to [facility] requesting help for EtOH Abuse, he was admitted to [facility] on 3/29.  PPHx is significant for Depression, Anxiety, EtOH Abuse, and has been to Residential Rehab 4 times, and no history of Suicide Attempts, Self Injurious Behavior, or Psychiatric Hospitalizations.   PMHx is significant for Withdrawal Seizures.    Vadim is continuing to have withdrawal symptoms-improving tremors but having no signs/symptoms of seizure.  Given his history of withdrawal seizures we will continue with the scheduled Ativan taper to end 4/1.  He is tolerating Zoloft.  He continues to be interested in residential rehab and appreciate social work's assistance with this.  We will not make any other changes to his medications at this time.  We will continue to monitor.   MDD, Recurrent, Moderate  GAD: -Continue Zoloft 50 mg daily for depression and anxiety. -Continue Agitation Protocol: Zyprexa     Withdrawal: -Continue CIWA, last score= 1 -Continue Ativan taper to end 4/1 -Continue Ativan 1 mg q6 PRN CIWA>10 -Continue Imodium 2-4 mg PRN diarrhea -Continue Zofran-ODT 4 mg q6 PRN nausea -Continue Thiamine 100 mg daily for nutritional supplementation -Continue Multivitamin daily for nutritional supplementation      -Continue PRN's: Tylenol, Maalox, Atarax, Milk of Magnesia, Trazodone     Dispo: Interested in Residential Rehab   Lance Muss, MD 01/26/2024 11:19 AM

## 2024-01-26 NOTE — ED Notes (Signed)
 Patient completed his ativan taper today however, he came to nursing station and stated he "didn't feel well"  Patient scored a 12 on CIWA and b/p 141/88 76.  Dr Loleta Chance made aware and ordered 1.5 mg ativan PO PRN.  Will monitor.

## 2024-01-26 NOTE — ED Notes (Signed)
 Patient is sleeping. Respirations equal and unlabored, skin warm and dry. No change in assessment or acuity. Routine safety checks conducted according to facility protocol. Will continue to monitor for safety.

## 2024-01-27 DIAGNOSIS — F331 Major depressive disorder, recurrent, moderate: Secondary | ICD-10-CM | POA: Diagnosis not present

## 2024-01-27 DIAGNOSIS — F411 Generalized anxiety disorder: Secondary | ICD-10-CM | POA: Diagnosis not present

## 2024-01-27 DIAGNOSIS — F1020 Alcohol dependence, uncomplicated: Secondary | ICD-10-CM | POA: Diagnosis not present

## 2024-01-27 DIAGNOSIS — A6002 Herpesviral infection of other male genital organs: Secondary | ICD-10-CM | POA: Diagnosis not present

## 2024-01-27 NOTE — ED Notes (Signed)
 Patient is resting in bed without any distress noted. No s/s of discomfort. Staff will continue to monitor safety per protocol and for changes in condition.

## 2024-01-27 NOTE — [place] Notes (Signed)
Pt's CIWA was 8. °

## 2024-01-27 NOTE — Group Note (Signed)
 Group Topic: Substance Abuse Treatment  Group Date: 01/27/2024 Start Time: 0200 End Time: 0300 Facilitators: Loleta Dicker, LCSW  Department: [facility]  Number of Participants: 5  Group Focus: chemical dependency education, chemical dependency issues, and relapse prevention Treatment Modality:  Behavior Modification Therapy Interventions utilized were story telling and support Purpose: enhance coping skills, express feelings, increase insight, relapse prevention strategies, and trigger / craving management  Name: Trevor [name] Date of Birth: 01-Jan-1981  MR: [id number]    Level of Participation: active Quality of Participation: attentive Response: Patient participated in AA group on today. There were issues reported by the staff conducting the group. Patient has proactive in his treatment and has been respectable to staff and peers.  Plan: referral / recommendations  Patients Problems:  Patient Active Problem List   Diagnosis Date Noted   Alcohol use disorder 01/23/2024   Hyponatremia 06/19/2022   Transaminitis 06/18/2022   GERD (gastroesophageal reflux disease) 06/18/2022   Hypokalemia 06/18/2022   Loss of consciousness (HCC) 06/18/2022   QT prolongation 06/18/2022   Chronic hepatitis C without hepatic coma (HCC) 06/28/2019   Alcohol abuse 06/28/2019   Psoriasis 06/07/2019   Suicidal ideation 06/07/2019   Withdrawal symptoms, alcohol, with delirium (HCC) 06/07/2019   Alcoholic hepatitis without ascites 06/05/2019   Alcohol withdrawal (HCC) 06/03/2019   Obesity (BMI 30-39.9) 01/09/2016   HSV-2 (herpes simplex virus 2) infection

## 2024-01-27 NOTE — [place] Notes (Addendum)
Pt was visible in the milieu and interacted with peers. No distress noted or concerns voiced. Staff will monitor for pt's safety.

## 2024-01-27 NOTE — [place] Notes (Signed)
Pt's CIWA was 5. °

## 2024-01-27 NOTE — Discharge Planning (Signed)
 Referral was received by [facility] and per Admissions, patient has been accepted to their facility and can admit on Tuesday 02/02/2024 at 11:30 am. Transportation will be arranged via [facility]. Patient aware of current plan. MD made aware. Patient will need a 14-21 day supply of medication and clothing.  No other needs to report at this time.    LCSW will continue to follow up and provide updates as received.    Fernande Boyden, LCSW Clinical Social Worker [facility] Ph: [phone]

## 2024-01-27 NOTE — Group Note (Signed)
 Group Topic: Recovery Basics  Group Date: 01/27/2024 Start Time: 2015 End Time: 2100 Facilitators: Rae Lips B  Department: [facility]  Number of Participants: 5  Group Focus: abuse issues, acceptance, activities of daily living skills, clarity of thought, communication, community group, daily focus, depression, and relapse prevention Treatment Modality:  Individual Therapy Interventions utilized were leisure development, problem solving, story telling, and support Purpose: enhance coping skills, express feelings, increase insight, regain self-worth, reinforce self-care, and relapse prevention strategies  Name: Trevor [name] Date of Birth: [birth year]  MR: [id number]    Level of Participation: active Quality of Participation: attentive, cooperative, and motivated Interactions with others: gave feedback Mood/Affect: appropriate, brightens with interaction, and positive Triggers (if applicable): NA Cognition: coherent/clear Progress: Gaining insight Response: NA Plan: patient will be encouraged to keep going to groups.   Patients Problems:  Patient Active Problem List   Diagnosis Date Noted   Alcohol use disorder 01/23/2024   Hyponatremia 06/19/2022   Transaminitis 06/18/2022   GERD (gastroesophageal reflux disease) 06/18/2022   Hypokalemia 06/18/2022   Loss of consciousness (HCC) 06/18/2022   QT prolongation 06/18/2022   Chronic hepatitis C without hepatic coma (HCC) 06/28/2019   Alcohol abuse 06/28/2019   Psoriasis 06/07/2019   Suicidal ideation 06/07/2019   Withdrawal symptoms, alcohol, with delirium (HCC) 06/07/2019   Alcoholic hepatitis without ascites 06/05/2019   Alcohol withdrawal (HCC) 06/03/2019   Obesity (BMI 30-39.9) 01/09/2016   HSV-2 (herpes simplex virus 2) infection

## 2024-01-27 NOTE — [place] Notes (Signed)
Pt is awake, alert and oriented X3. Pt did not voice any complaints of pain or discomfort. No signs of acute distress noted. Administered scheduled meds per order. Pt denies current SI/HI/AVH, plan or intent. Staff will monitor for pt's safety.

## 2024-01-27 NOTE — Group Note (Signed)
 Group Topic: Social Support  Group Date: 01/27/2024 Start Time: 1630 End Time: 1700 Facilitators: Loyce Dys, NT  Department: [facility]  Number of Participants: 6  Group Focus: communication Treatment Modality:  Individual Therapy Interventions utilized were support Purpose: express feelings, improve communication skills, and regain self-worth  Name: Trevor [name] Date of Birth: 13-Jun-1981  MR: [id number]    Level of Participation: active Quality of Participation: attentive Interactions with others: gave feedback Mood/Affect: appropriate Triggers (if applicable): n/a Cognition: coherent/clear Progress: Gaining insight Response: PT stated he would find ways to distract him from relapsing Plan: patient will be encouraged to keep attending groups  Patients Problems:  Patient Active Problem List   Diagnosis Date Noted   Alcohol use disorder 01/23/2024   Hyponatremia 06/19/2022   Transaminitis 06/18/2022   GERD (gastroesophageal reflux disease) 06/18/2022   Hypokalemia 06/18/2022   Loss of consciousness (HCC) 06/18/2022   QT prolongation 06/18/2022   Chronic hepatitis C without hepatic coma (HCC) 06/28/2019   Alcohol abuse 06/28/2019   Psoriasis 06/07/2019   Suicidal ideation 06/07/2019   Withdrawal symptoms, alcohol, with delirium (HCC) 06/07/2019   Alcoholic hepatitis without ascites 06/05/2019   Alcohol withdrawal (HCC) 06/03/2019   Obesity (BMI 30-39.9) 01/09/2016   HSV-2 (herpes simplex virus 2) infection

## 2024-01-27 NOTE — ED Provider Notes (Signed)
 Behavioral Health Progress Note  Date and Time: 01/27/2024 12:53 PM Name: Trevor [name] MRN:  [id number]  Subjective:  Trevor [name] is a 43 yr old male who presented on 3/28 to [facility] requesting help for EtOH Abuse, he was admitted to [facility] on 3/29 for alcohol detox and rehab services. PPHx is significant for Depression, Anxiety, EtOH Abuse, and has been to Residential Rehab 4 times, and no history of Suicide Attempts, Self Injurious Behavior, or Psychiatric Hospitalizations. PMHx is significant for Withdrawal Seizures.   On interview today, the patient feels that his mood is overall "fine". He is still having some withdrawal symptoms, with night sweats, tremors, minor nausea. He is eating okay though and has not had any emesis today. He is not having hallucinations, thoughts of harm to self or others. He is hoping to go to [facility]. He is open to other, longer term programs after that, if possible. He appears more hopeful for the future.   Diagnosis:  Final diagnoses:  Alcohol abuse  Moderate episode of recurrent major depressive disorder (HCC)  GAD (generalized anxiety disorder)    Total Time spent with patient: 20 minutes  Past Psychiatric History: Depression, Anxiety, EtOH Abuse, and has been to Residential Rehab 4 times, and no history of Suicide Attempts, Self Injurious Behavior, or Psychiatric Hospitalizations.    Past Medical History:  Depression, Anxiety, EtOH Abuse, and has been to Residential Rehab 4 times, and no history of Suicide Attempts, Self Injurious Behavior, or Psychiatric Hospitalizations.    Family History:  Mother- Skin Cancer Paternal Grandfather- Colon Cancer Paternal Grandmother and Grandfather- Lung Cancer   Family Psychiatric  History:  Brother- EtOH Abuse Maternal Grandfather and Grandmother- EtOH Abuse No Known Diagnosis' or Suicides   Social History:  Divorced, has a daughter. Recently fired from his job. Started drinking at age 43, longest period of  sobriety 6 months, been to Residential Rehab 4 times.     Sleep: Fair  Appetite:  Fair  Current Medications:  Current Facility-Administered Medications  Medication Dose Route Frequency Provider Last Rate Last Admin   acetaminophen (TYLENOL) tablet 650 mg  650 mg Oral Q6H PRN Marlou Sa, NP       alum & mag hydroxide-simeth (MAALOX/MYLANTA) 200-200-20 MG/5ML suspension 30 mL  30 mL Oral Q4H PRN Rayburn Go, Veronique M, NP       gabapentin (NEURONTIN) capsule 200 mg  200 mg Oral TID Miguel Rota, MD   200 mg at 01/27/24 0848   hydrOXYzine (ATARAX) tablet 25 mg  25 mg Oral Q6H PRN Miguel Rota, MD   25 mg at 01/26/24 2125   LORazepam (ATIVAN) tablet 0.5 mg  0.5 mg Oral BID Roselle Locus, MD   0.5 mg at 01/27/24 0848   magnesium hydroxide (MILK OF MAGNESIA) suspension 30 mL  30 mL Oral Daily PRN Marlou Sa, NP   30 mL at 01/25/24 1309   multivitamin with minerals tablet 1 tablet  1 tablet Oral Daily Marlou Sa, NP   1 tablet at 01/27/24 0848   OLANZapine (ZYPREXA) injection 10 mg  10 mg Intramuscular TID PRN Marlou Sa, NP       OLANZapine (ZYPREXA) injection 5 mg  5 mg Intramuscular TID PRN Rayburn Go, Veronique M, NP       OLANZapine zydis (ZYPREXA) disintegrating tablet 5 mg  5 mg Oral TID PRN Rayburn Go, Veronique M, NP       sertraline (ZOLOFT) tablet 50 mg  50 mg Oral Daily Pashayan,  Mardelle Matte, MD   50 mg at 01/27/24 0848   thiamine (VITAMIN B1) tablet 100 mg  100 mg Oral Daily Olin Pia M, NP   100 mg at 01/27/24 0848   traZODone (DESYREL) tablet 50 mg  50 mg Oral QHS PRN Marlou Sa, NP   50 mg at 01/26/24 2125   Current Outpatient Medications  Medication Sig Dispense Refill   famotidine (PEPCID) 20 MG tablet Take 20 mg by mouth 2 (two) times daily. (Patient not taking: Reported on 01/22/2024)     hydrOXYzine (ATARAX) 50 MG tablet Take 50 mg by mouth every 8 (eight) hours as needed for anxiety. (Patient not taking:  Reported on 01/22/2024)     Multiple Vitamin (MULTIVITAMIN WITH MINERALS) TABS tablet Take 1 tablet by mouth daily.     thiamine (VITAMIN B1) 100 MG tablet Take 1 tablet (100 mg total) by mouth daily. (Patient not taking: Reported on 01/22/2024) 30 tablet 0   traZODone (DESYREL) 150 MG tablet Take 150 mg by mouth at bedtime. (Patient not taking: Reported on 01/22/2024)      Labs  Lab Results:  Admission on 01/22/2024, Discharged on 01/23/2024  Component Date Value Ref Range Status   WBC 01/22/2024 6.8  4.0 - 10.5 K/uL Final   RBC 01/22/2024 5.22  4.22 - 5.81 MIL/uL Final   Hemoglobin 01/22/2024 17.2 (H)  13.0 - 17.0 g/dL Final   HCT 96/01/5408 47.8  39.0 - 52.0 % Final   MCV 01/22/2024 91.6  80.0 - 100.0 fL Final   MCH 01/22/2024 33.0  26.0 - 34.0 pg Final   MCHC 01/22/2024 36.0  30.0 - 36.0 g/dL Final   RDW 81/19/1478 12.1  11.5 - 15.5 % Final   Platelets 01/22/2024 319  150 - 400 K/uL Final   nRBC 01/22/2024 0.0  0.0 - 0.2 % Final   Neutrophils Relative % 01/22/2024 70  % Final   Neutro Abs 01/22/2024 4.8  1.7 - 7.7 K/uL Final   Lymphocytes Relative 01/22/2024 25  % Final   Lymphs Abs 01/22/2024 1.7  0.7 - 4.0 K/uL Final   Monocytes Relative 01/22/2024 4  % Final   Monocytes Absolute 01/22/2024 0.3  0.1 - 1.0 K/uL Final   Eosinophils Relative 01/22/2024 0  % Final   Eosinophils Absolute 01/22/2024 0.0  0.0 - 0.5 K/uL Final   Basophils Relative 01/22/2024 1  % Final   Basophils Absolute 01/22/2024 0.1  0.0 - 0.1 K/uL Final   Immature Granulocytes 01/22/2024 0  % Final   Abs Immature Granulocytes 01/22/2024 0.02  0.00 - 0.07 K/uL Final   Performed at [facility], [address], [place], [place] [address]   Sodium 01/22/2024 138  135 - 145 mmol/L Final   Potassium 01/22/2024 3.9  3.5 - 5.1 mmol/L Final   Chloride 01/22/2024 99  98 - 111 mmol/L Final   CO2 01/22/2024 25  22 - 32 mmol/L Final   Glucose, Bld 01/22/2024 99  70 - 99 mg/dL Final   Glucose reference range applies  only to samples taken after fasting for at least 8 hours.   BUN 01/22/2024 13  6 - 20 mg/dL Final   Creatinine, Ser 01/22/2024 0.83  0.61 - 1.24 mg/dL Final   Calcium 13/05/6577 8.9  8.9 - 10.3 mg/dL Final   Total Protein 46/96/2952 7.5  6.5 - 8.1 g/dL Final   Albumin 84/13/2440 4.6  3.5 - 5.0 g/dL Final   AST 08/23/2535 44 (H)  15 -  41 U/L Final   ALT 01/22/2024 46 (H)  0 - 44 U/L Final   Alkaline Phosphatase 01/22/2024 60  38 - 126 U/L Final   Total Bilirubin 01/22/2024 0.8  0.0 - 1.2 mg/dL Final   GFR, Estimated 01/22/2024 >60  >60 mL/min Final   Comment: (NOTE) Calculated using the CKD-EPI Creatinine Equation (2021)    Anion gap 01/22/2024 14  5 - 15 Final   Performed at [facility], [address] [place], [place] [address]   Hgb A1c MFr Bld 01/22/2024 5.0  4.8 - 5.6 % Final   Comment: (NOTE) Pre diabetes:          5.7%-6.4%  Diabetes:              >6.4%  Glycemic control for   <7.0% adults with diabetes    Mean Plasma Glucose 01/22/2024 96.8  mg/dL Final   Performed at [facility], [address], [place], [place] [address]   Magnesium 01/22/2024 1.9  1.7 - 2.4 mg/dL Final   Performed at [facility], [address] [place], [place] [address]   Alcohol, Ethyl (B) 01/22/2024 109 (H)  <10 mg/dL Final   Comment: (NOTE) Lowest detectable limit for serum alcohol is 10 mg/dL.  For medical purposes only. Performed at [facility], [address], [place], [place] [address]    Cholesterol 01/22/2024 202 (H)  0 - 200 mg/dL Final   Triglycerides 65/78/4696 65  <150 mg/dL Final   HDL 29/52/8413 84  >40 mg/dL Final   Total CHOL/HDL Ratio 01/22/2024 2.4  RATIO Final   VLDL 01/22/2024 13  0 - 40 mg/dL Final   LDL Cholesterol 01/22/2024 105 (H)  0 - 99 mg/dL Final   Comment:        Total Cholesterol/HDL:CHD Risk Coronary Heart Disease Risk Table                     Men   Women  1/2 Average Risk   3.4   3.3  Average Risk       5.0   4.4  2 X Average  Risk   9.6   7.1  3 X Average Risk  23.4   11.0        Use the calculated Patient Ratio above and the CHD Risk Table to determine the patient's CHD Risk.        ATP III CLASSIFICATION (LDL):  <100     mg/dL   Optimal  244-010  mg/dL   Near or Above                    Optimal  130-159  mg/dL   Borderline  272-536  mg/dL   High  >644     mg/dL   Very High Performed at [facility], [address], [place], [place] [address]    TSH 01/22/2024 0.458  0.350 - 4.500 uIU/mL Final   Comment: Performed by a 3rd Generation assay with a functional sensitivity of <=0.01 uIU/mL. Performed at [facility], [address], [place], [place] [address]    RPR Ser Ql 01/22/2024 NON REACTIVE  NON REACTIVE Final   Performed at [facility], [address] [place], [place] [address]   Color, Urine 01/22/2024 YELLOW  YELLOW Final   APPearance 01/22/2024 CLEAR  CLEAR Final   Specific Gravity, Urine 01/22/2024 1.020  1.005 - 1.030 Final  pH 01/22/2024 5.0  5.0 - 8.0 Final   Glucose, UA 01/22/2024 NEGATIVE  NEGATIVE mg/dL Final   Hgb urine dipstick 01/22/2024 NEGATIVE  NEGATIVE Final   Bilirubin Urine 01/22/2024 NEGATIVE  NEGATIVE Final   Ketones, ur 01/22/2024 5 (A)  NEGATIVE mg/dL Final   Protein, ur 82/95/6213 NEGATIVE  NEGATIVE mg/dL Final   Nitrite 08/65/7846 NEGATIVE  NEGATIVE Final   Leukocytes,Ua 01/22/2024 NEGATIVE  NEGATIVE Final   Performed at [facility], [address], [place], [place] [address]   POC Amphetamine UR 01/22/2024 None Detected   Final   POC Secobarbital (BAR) 01/22/2024 None Detected   Final   POC Buprenorphine (BUP) 01/22/2024 None Detected   Final   POC Oxazepam (BZO) 01/22/2024 None Detected   Final   POC Cocaine UR 01/22/2024 None Detected   Final   POC Methamphetamine UR 01/22/2024 None Detected   Final   POC Morphine 01/22/2024 None Detected   Final   POC Methadone UR 01/22/2024 None Detected   Final   POC Oxycodone UR 01/22/2024 None  Detected   Final   POC Marijuana UR 01/22/2024 None Detected   Final  Admission on 12/24/2023, Discharged on 12/25/2023  Component Date Value Ref Range Status   Sodium 12/24/2023 144  135 - 145 mmol/L Final   Potassium 12/24/2023 3.8  3.5 - 5.1 mmol/L Final   Chloride 12/24/2023 107  98 - 111 mmol/L Final   CO2 12/24/2023 26  22 - 32 mmol/L Final   Glucose, Bld 12/24/2023 96  70 - 99 mg/dL Final   Glucose reference range applies only to samples taken after fasting for at least 8 hours.   BUN 12/24/2023 11  6 - 20 mg/dL Final   Creatinine, Ser 12/24/2023 0.62  0.61 - 1.24 mg/dL Final   Calcium 28/41/3244 8.6 (L)  8.9 - 10.3 mg/dL Final   Total Protein 10/29/7251 7.8  6.5 - 8.1 g/dL Final   Albumin 66/44/0347 4.3  3.5 - 5.0 g/dL Final   AST 42/59/5638 34  15 - 41 U/L Final   ALT 12/24/2023 27  0 - 44 U/L Final   Alkaline Phosphatase 12/24/2023 56  38 - 126 U/L Final   Total Bilirubin 12/24/2023 0.4  0.0 - 1.2 mg/dL Final   GFR, Estimated 12/24/2023 >60  >60 mL/min Final   Comment: (NOTE) Calculated using the CKD-EPI Creatinine Equation (2021)    Anion gap 12/24/2023 11  5 - 15 Final   Performed at [facility], [address], [place], [place] [address]   Alcohol, Ethyl (B) 12/24/2023 436 (HH)  <10 mg/dL Final   Comment: CRITICAL RESULT CALLED TO, READ BACK BY AND VERIFIED WITH Barnett Applebaum, RN 12/25/23 0050 BY K. DAVIS (NOTE) Lowest detectable limit for serum alcohol is 10 mg/dL.  For medical purposes only. Performed at [facility], [address], [place], [place] [address]    Opiates 12/25/2023 NONE DETECTED  NONE DETECTED Final   Cocaine 12/25/2023 NONE DETECTED  NONE DETECTED Final   Benzodiazepines 12/25/2023 NONE DETECTED  NONE DETECTED Final   Amphetamines 12/25/2023 NONE DETECTED  NONE DETECTED Final   Tetrahydrocannabinol 12/25/2023 NONE DETECTED  NONE DETECTED Final   Barbiturates 12/25/2023 NONE DETECTED  NONE DETECTED Final    Comment: (NOTE) DRUG SCREEN FOR MEDICAL PURPOSES ONLY.  IF CONFIRMATION IS NEEDED FOR ANY PURPOSE, NOTIFY LAB WITHIN 5 DAYS.  LOWEST DETECTABLE LIMITS FOR URINE DRUG SCREEN Drug Class  Cutoff (ng/mL) Amphetamine and metabolites    1000 Barbiturate and metabolites    200 Benzodiazepine                 200 Opiates and metabolites        300 Cocaine and metabolites        300 THC                            50 Performed at [facility], [address], [place], [place] [address]    WBC 12/24/2023 5.1  4.0 - 10.5 K/uL Final   RBC 12/24/2023 4.78  4.22 - 5.81 MIL/uL Final   Hemoglobin 12/24/2023 16.0  13.0 - 17.0 g/dL Final   HCT 60/45/4098 45.7  39.0 - 52.0 % Final   MCV 12/24/2023 95.6  80.0 - 100.0 fL Final   MCH 12/24/2023 33.5  26.0 - 34.0 pg Final   MCHC 12/24/2023 35.0  30.0 - 36.0 g/dL Final   RDW 11/91/4782 12.8  11.5 - 15.5 % Final   Platelets 12/24/2023 292  150 - 400 K/uL Final   nRBC 12/24/2023 0.0  0.0 - 0.2 % Final   Neutrophils Relative % 12/24/2023 61  % Final   Neutro Abs 12/24/2023 3.0  1.7 - 7.7 K/uL Final   Lymphocytes Relative 12/24/2023 31  % Final   Lymphs Abs 12/24/2023 1.6  0.7 - 4.0 K/uL Final   Monocytes Relative 12/24/2023 8  % Final   Monocytes Absolute 12/24/2023 0.4  0.1 - 1.0 K/uL Final   Eosinophils Relative 12/24/2023 0  % Final   Eosinophils Absolute 12/24/2023 0.0  0.0 - 0.5 K/uL Final   Basophils Relative 12/24/2023 0  % Final   Basophils Absolute 12/24/2023 0.0  0.0 - 0.1 K/uL Final   Immature Granulocytes 12/24/2023 0  % Final   Abs Immature Granulocytes 12/24/2023 0.02  0.00 - 0.07 K/uL Final   Performed at [facility], [address], [place], [place] [address]   Lipase 12/24/2023 41  11 - 51 U/L Final   Performed at [facility], [address] [place], [place] [address]   Color, Urine 12/25/2023 YELLOW  YELLOW Final   APPearance 12/25/2023 HAZY (A)  CLEAR  Final   Specific Gravity, Urine 12/25/2023 1.010  1.005 - 1.030 Final   pH 12/25/2023 7.0  5.0 - 8.0 Final   Glucose, UA 12/25/2023 NEGATIVE  NEGATIVE mg/dL Final   Hgb urine dipstick 12/25/2023 NEGATIVE  NEGATIVE Final   Bilirubin Urine 12/25/2023 NEGATIVE  NEGATIVE Final   Ketones, ur 12/25/2023 NEGATIVE  NEGATIVE mg/dL Final   Protein, ur 78/46/9629 NEGATIVE  NEGATIVE mg/dL Final   Nitrite 52/84/1324 NEGATIVE  NEGATIVE Final   Leukocytes,Ua 12/25/2023 NEGATIVE  NEGATIVE Final   Performed at [facility], [address], [place], [place] [address]  Admission on 09/23/2023, Discharged on 09/29/2023  Component Date Value Ref Range Status   WBC 09/23/2023 5.2  4.0 - 10.5 K/uL Final   RBC 09/23/2023 5.10  4.22 - 5.81 MIL/uL Final   Hemoglobin 09/23/2023 16.9  13.0 - 17.0 g/dL Final   HCT 72/53/6644 46.5  39.0 - 52.0 % Final   MCV 09/23/2023 91.2  80.0 - 100.0 fL Final   MCH 09/23/2023 33.1  26.0 - 34.0 pg Final   MCHC 09/23/2023 36.3 (H)  30.0 - 36.0 g/dL Final   RDW 03/47/4259 12.9  11.5 - 15.5 %  Final   Platelets 09/23/2023 185  150 - 400 K/uL Final   nRBC 09/23/2023 0.0  0.0 - 0.2 % Final   Performed at [facility], [address], [place], [place] [address]   Sodium 09/23/2023 139  135 - 145 mmol/L Final   Potassium 09/23/2023 3.5  3.5 - 5.1 mmol/L Final   Chloride 09/23/2023 99  98 - 111 mmol/L Final   CO2 09/23/2023 22  22 - 32 mmol/L Final   Glucose, Bld 09/23/2023 169 (H)  70 - 99 mg/dL Final   Glucose reference range applies only to samples taken after fasting for at least 8 hours.   BUN 09/23/2023 6  6 - 20 mg/dL Final   Creatinine, Ser 09/23/2023 0.71  0.61 - 1.24 mg/dL Final   Calcium 60/45/4098 8.4 (L)  8.9 - 10.3 mg/dL Final   Total Protein 11/91/4782 6.9  6.5 - 8.1 g/dL Final   Albumin 95/62/1308 4.0  3.5 - 5.0 g/dL Final   AST 65/78/4696 135 (H)  15 - 41 U/L Final   ALT 09/23/2023 76 (H)  0 - 44 U/L Final   Alkaline Phosphatase 09/23/2023  58  38 - 126 U/L Final   Total Bilirubin 09/23/2023 0.9  <1.2 mg/dL Final   GFR, Estimated 09/23/2023 >60  >60 mL/min Final   Comment: (NOTE) Calculated using the CKD-EPI Creatinine Equation (2021)    Anion gap 09/23/2023 18 (H)  5 - 15 Final   Performed at [facility], [address] [place], [place] [address]   Troponin I (High Sensitivity) 09/23/2023 14  <18 ng/L Final   Comment: (NOTE) Elevated high sensitivity troponin I (hsTnI) values and significant  changes across serial measurements may suggest ACS but many other  chronic and acute conditions are known to elevate hsTnI results.  Refer to the "Links" section for chest pain algorithms and additional  guidance. Performed at [facility], [address], [place], [place] [address]    Alcohol, Ethyl (B) 09/23/2023 412 (HH)  <10 mg/dL Final   Comment: CRITICAL RESULT CALLED TO, READ BACK BY AND VERIFIED WITH B. OSORIO, RN AT [phone] 11.27.24 JLASIGAN (NOTE) Lowest detectable limit for serum alcohol is 10 mg/dL.  For medical purposes only. Performed at [facility], [address], [place], [place] [address]    Lipase 09/23/2023 86 (H)  11 - 51 U/L Final   Performed at [facility], [address] [place], [place] [address]   Troponin I (High Sensitivity) 09/23/2023 19 (H)  <18 ng/L Final   Comment: (NOTE) Elevated high sensitivity troponin I (hsTnI) values and significant  changes across serial measurements may suggest ACS but many other  chronic and acute conditions are known to elevate hsTnI results.  Refer to the "Links" section for chest pain algorithms and additional  guidance. Performed at [facility], [address], [place], [place] [address]    Magnesium 09/23/2023 1.8  1.7 - 2.4 mg/dL Final   Performed at [facility], [address], [place], [place] [address]   Phosphorus 09/23/2023 2.2 (L)  2.5 - 4.6 mg/dL Final   Performed at [facility], [address],  [place], [place] [address]   Opiates 09/23/2023 NONE DETECTED  NONE DETECTED Final   Cocaine 09/23/2023 NONE DETECTED  NONE DETECTED Final   Benzodiazepines 09/23/2023 NONE DETECTED  NONE DETECTED Final   Amphetamines 09/23/2023 NONE DETECTED  NONE DETECTED Final  Tetrahydrocannabinol 09/23/2023 NONE DETECTED  NONE DETECTED Final   Barbiturates 09/23/2023 NONE DETECTED  NONE DETECTED Final   Comment: (NOTE) DRUG SCREEN FOR MEDICAL PURPOSES ONLY.  IF CONFIRMATION IS NEEDED FOR ANY PURPOSE, NOTIFY LAB WITHIN 5 DAYS.  LOWEST DETECTABLE LIMITS FOR URINE DRUG SCREEN Drug Class                     Cutoff (ng/mL) Amphetamine and metabolites    1000 Barbiturate and metabolites    200 Benzodiazepine                 200 Opiates and metabolites        300 Cocaine and metabolites        300 THC                            50 Performed at [facility], [address] [place], [place] [address]    HIV Screen 4th Generation wRfx 09/23/2023 Non Reactive  Non Reactive Final   Performed at [facility], [address] [place], [place] [address]   Phosphorus 09/24/2023 3.1  2.5 - 4.6 mg/dL Final   Performed at [facility], [address] [place], [place] [address]   Lipase 09/24/2023 92 (H)  11 - 51 U/L Final   Performed at [facility], [address] [place], [place] [address]   Sodium 09/24/2023 130 (L)  135 - 145 mmol/L Final   DELTA CHECK NOTED   Potassium 09/24/2023 3.8  3.5 - 5.1 mmol/L Final   Chloride 09/24/2023 98  98 - 111 mmol/L Final   CO2 09/24/2023 24  22 - 32 mmol/L Final   Glucose, Bld 09/24/2023 94  70 - 99 mg/dL Final   Glucose reference range applies only to samples taken after fasting for at least 8 hours.   BUN 09/24/2023 9  6 - 20 mg/dL Final   Creatinine, Ser 09/24/2023 0.63  0.61 - 1.24 mg/dL Final   Calcium 95/28/4132 8.5 (L)  8.9 - 10.3 mg/dL Final   Total Protein 44/10/270 5.6 (L)  6.5 - 8.1 g/dL Final   Albumin 53/66/4403 3.2 (L)  3.5 - 5.0  g/dL Final   AST 47/42/5956 78 (H)  15 - 41 U/L Final   ALT 09/24/2023 54 (H)  0 - 44 U/L Final   Alkaline Phosphatase 09/24/2023 42  38 - 126 U/L Final   Total Bilirubin 09/24/2023 1.2 (H)  <1.2 mg/dL Final   GFR, Estimated 09/24/2023 >60  >60 mL/min Final   Comment: (NOTE) Calculated using the CKD-EPI Creatinine Equation (2021)    Anion gap 09/24/2023 8  5 - 15 Final   Performed at [facility], [address], [place], [place] [address]   WBC 09/24/2023 4.1  4.0 - 10.5 K/uL Final   RBC 09/24/2023 3.97 (L)  4.22 - 5.81 MIL/uL Final   Hemoglobin 09/24/2023 13.2  13.0 - 17.0 g/dL Final   HCT 43/32/9518 37.2 (L)  39.0 - 52.0 % Final   MCV 09/24/2023 93.7  80.0 - 100.0 fL Final   MCH 09/24/2023 33.2  26.0 - 34.0 pg Final   MCHC 09/24/2023 35.5  30.0 - 36.0 g/dL Final   RDW 84/16/6063 12.8  11.5 - 15.5 % Final   Platelets 09/24/2023 130 (L)  150 - 400 K/uL Final   nRBC 09/24/2023 0.0  0.0 - 0.2 % Final  Neutrophils Relative % 09/24/2023 60  % Final   Neutro Abs 09/24/2023 2.5  1.7 - 7.7 K/uL Final   Lymphocytes Relative 09/24/2023 27  % Final   Lymphs Abs 09/24/2023 1.1  0.7 - 4.0 K/uL Final   Monocytes Relative 09/24/2023 11  % Final   Monocytes Absolute 09/24/2023 0.4  0.1 - 1.0 K/uL Final   Eosinophils Relative 09/24/2023 1  % Final   Eosinophils Absolute 09/24/2023 0.0  0.0 - 0.5 K/uL Final   Basophils Relative 09/24/2023 1  % Final   Basophils Absolute 09/24/2023 0.0  0.0 - 0.1 K/uL Final   Immature Granulocytes 09/24/2023 0  % Final   Abs Immature Granulocytes 09/24/2023 0.01  0.00 - 0.07 K/uL Final   Performed at [facility], [address] [place], [place] [address]   Magnesium 09/24/2023 1.6 (L)  1.7 - 2.4 mg/dL Final   Performed at [facility], [address] [place], [place] [address]   WBC 09/25/2023 5.1  4.0 - 10.5 K/uL Final   RBC 09/25/2023 4.20 (L)  4.22 - 5.81 MIL/uL Final   Hemoglobin 09/25/2023 13.5  13.0 - 17.0 g/dL Final   HCT 09/81/1914  40.0  39.0 - 52.0 % Final   MCV 09/25/2023 95.2  80.0 - 100.0 fL Final   MCH 09/25/2023 32.1  26.0 - 34.0 pg Final   MCHC 09/25/2023 33.8  30.0 - 36.0 g/dL Final   RDW 78/29/5621 13.1  11.5 - 15.5 % Final   Platelets 09/25/2023 138 (L)  150 - 400 K/uL Final   REPEATED TO VERIFY   nRBC 09/25/2023 0.0  0.0 - 0.2 % Final   Performed at [facility], [address] [place], [place] [address]   Sodium 09/25/2023 134 (L)  135 - 145 mmol/L Final   Potassium 09/25/2023 3.9  3.5 - 5.1 mmol/L Final   Chloride 09/25/2023 102  98 - 111 mmol/L Final   CO2 09/25/2023 22  22 - 32 mmol/L Final   Glucose, Bld 09/25/2023 92  70 - 99 mg/dL Final   Glucose reference range applies only to samples taken after fasting for at least 8 hours.   BUN 09/25/2023 12  6 - 20 mg/dL Final   Creatinine, Ser 09/25/2023 0.61  0.61 - 1.24 mg/dL Final   Calcium 78/46/9629 9.0  8.9 - 10.3 mg/dL Final   GFR, Estimated 09/25/2023 >60  >60 mL/min Final   Comment: (NOTE) Calculated using the CKD-EPI Creatinine Equation (2021)    Anion gap 09/25/2023 10  5 - 15 Final   Performed at [facility], [address], [place], [place] [address]   Phosphorus 09/25/2023 5.5 (H)  2.5 - 4.6 mg/dL Final   Performed at [facility], [address], [place], [place] [address]   Magnesium 09/25/2023 2.2  1.7 - 2.4 mg/dL Final   Performed at [facility], [address], [place], [place] [address]   Glucose-Capillary 09/25/2023 88  70 - 99 mg/dL Final   Glucose reference range applies only to samples taken after fasting for at least 8 hours.   Glucose-Capillary 09/25/2023 101 (H)  70 - 99 mg/dL Final   Glucose reference range applies only to samples taken after fasting for at least 8 hours.   Glucose-Capillary 09/25/2023 109 (H)  70 - 99 mg/dL Final   Glucose reference range applies only to samples taken after fasting for at least 8 hours.   WBC 09/26/2023  5.2  4.0 - 10.5 K/uL Final   RBC 09/26/2023 4.26  4.22 -  5.81 MIL/uL Final   Hemoglobin 09/26/2023 14.0  13.0 - 17.0 g/dL Final   HCT 16/07/9603 40.3  39.0 - 52.0 % Final   MCV 09/26/2023 94.6  80.0 - 100.0 fL Final   [facility] 09/26/2023 32.9  26.0 - 34.0 pg Final   MCHC 09/26/2023 34.7  30.0 - 36.0 g/dL Final   RDW 54/06/8118 13.1  11.5 - 15.5 % Final   Platelets 09/26/2023 143 (L)  150 - 400 K/uL Final   nRBC 09/26/2023 0.0  0.0 - 0.2 % Final   Performed at [facility], [address], [place], [place] [address]   Sodium 09/26/2023 134 (L)  135 - 145 mmol/L Final   Potassium 09/26/2023 4.4  3.5 - 5.1 mmol/L Final   Chloride 09/26/2023 106  98 - 111 mmol/L Final   CO2 09/26/2023 21 (L)  22 - 32 mmol/L Final   Glucose, Bld 09/26/2023 104 (H)  70 - 99 mg/dL Final   Glucose reference range applies only to samples taken after fasting for at least 8 hours.   BUN 09/26/2023 19  6 - 20 mg/dL Final   Creatinine, Ser 09/26/2023 0.60 (L)  0.61 - 1.24 mg/dL Final   Calcium 95/62/1308 8.6 (L)  8.9 - 10.3 mg/dL Final   GFR, Estimated 09/26/2023 >60  >60 mL/min Final   Comment: (NOTE) Calculated using the CKD-EPI Creatinine Equation (2021)    Anion gap 09/26/2023 7  5 - 15 Final   Performed at [facility], [address] [place], [place] [address]   Phosphorus 09/26/2023 3.9  2.5 - 4.6 mg/dL Final   Performed at [facility], [address], [place], [place] [address]   Magnesium 09/26/2023 2.1  1.7 - 2.4 mg/dL Final   Performed at [facility], [address], [place], [place] [address]   WBC 09/27/2023 5.4  4.0 - 10.5 K/uL Final   RBC 09/27/2023 4.42  4.22 - 5.81 MIL/uL Final   Hemoglobin 09/27/2023 14.5  13.0 - 17.0 g/dL Final   HCT 32/44/0102 42.1  39.0 - 52.0 % Final   MCV 09/27/2023 95.2  80.0 - 100.0 fL Final   MCH 09/27/2023 32.8  26.0 - 34.0 pg Final   MCHC 09/27/2023 34.4  30.0 - 36.0 g/dL Final   RDW 72/53/6644 13.0  11.5 - 15.5 % Final   Platelets 09/27/2023 163  150 - 400 K/uL Final   nRBC 09/27/2023 0.0  0.0 -  0.2 % Final   Performed at [facility], [address], [place], [place] [address]   Sodium 09/27/2023 136  135 - 145 mmol/L Final   Potassium 09/27/2023 4.4  3.5 - 5.1 mmol/L Final   Chloride 09/27/2023 104  98 - 111 mmol/L Final   CO2 09/27/2023 25  22 - 32 mmol/L Final   Glucose, Bld 09/27/2023 96  70 - 99 mg/dL Final   Glucose reference range applies only to samples taken after fasting for at least 8 hours.   BUN 09/27/2023 11  6 - 20 mg/dL Final   Creatinine, Ser 09/27/2023 0.75  0.61 - 1.24 mg/dL Final   Calcium 25/95/6387 9.3  8.9 - 10.3 mg/dL Final   GFR, Estimated 09/27/2023 >60  >60 mL/min Final   Comment: (NOTE) Calculated using the CKD-EPI Creatinine Equation (2021)    Anion gap 09/27/2023 7  5 - 15  Final   Performed at [facility], [address], [place], [place] [address]   Phosphorus 09/27/2023 4.5  2.5 - 4.6 mg/dL Final   Performed at [facility], [address], [place], [place] [address]   Magnesium 09/27/2023 2.2  1.7 - 2.4 mg/dL Final   Performed at [facility], [address] [place], [place] [address]   Glucose-Capillary 09/26/2023 98  70 - 99 mg/dL Final   Glucose reference range applies only to samples taken after fasting for at least 8 hours.   WBC 09/28/2023 5.6  4.0 - 10.5 K/uL Final   RBC 09/28/2023 4.35  4.22 - 5.81 MIL/uL Final   Hemoglobin 09/28/2023 14.5  13.0 - 17.0 g/dL Final   HCT 21/30/8657 42.1  39.0 - 52.0 % Final   MCV 09/28/2023 96.8  80.0 - 100.0 fL Final   MCH 09/28/2023 33.3  26.0 - 34.0 pg Final   MCHC 09/28/2023 34.4  30.0 - 36.0 g/dL Final   RDW 84/69/6295 12.9  11.5 - 15.5 % Final   Platelets 09/28/2023 182  150 - 400 K/uL Final   nRBC 09/28/2023 0.0  0.0 - 0.2 % Final   Performed at [facility], [address] [place], [place] [address]   Sodium 09/28/2023 135  135 - 145 mmol/L Final   Potassium 09/28/2023 4.2  3.5 - 5.1 mmol/L Final   Chloride 09/28/2023 104  98 - 111 mmol/L Final   CO2  09/28/2023 24  22 - 32 mmol/L Final   Glucose, Bld 09/28/2023 102 (H)  70 - 99 mg/dL Final   Glucose reference range applies only to samples taken after fasting for at least 8 hours.   BUN 09/28/2023 16  6 - 20 mg/dL Final   Creatinine, Ser 09/28/2023 0.75  0.61 - 1.24 mg/dL Final   Calcium 24/40/1027 8.8 (L)  8.9 - 10.3 mg/dL Final   GFR, Estimated 09/28/2023 >60  >60 mL/min Final   Comment: (NOTE) Calculated using the CKD-EPI Creatinine Equation (2021)    Anion gap 09/28/2023 7  5 - 15 Final   Performed at [facility], [address] [place], [place] [address]   Phosphorus 09/28/2023 3.9  2.5 - 4.6 mg/dL Final   Performed at [facility], [address] [place], [place] [address]   Magnesium 09/28/2023 1.9  1.7 - 2.4 mg/dL Final   Performed at [facility], [address] [place], [place] [address]   Total Protein 09/28/2023 6.6  6.5 - 8.1 g/dL Final   Albumin 56/38/7564 3.7  3.5 - 5.0 g/dL Final   AST 33/29/5188 28  15 - 41 U/L Final   ALT 09/28/2023 38  0 - 44 U/L Final   Alkaline Phosphatase 09/28/2023 40  38 - 126 U/L Final   Total Bilirubin 09/28/2023 0.7  <1.2 mg/dL Final   Bilirubin, Direct 09/28/2023 0.1  0.0 - 0.2 mg/dL Final   Indirect Bilirubin 09/28/2023 0.6  0.3 - 0.9 mg/dL Final   Performed at [facility], [address], [place], [place] [address]   Lipase 09/28/2023 84 (H)  11 - 51 U/L Final   Performed at [facility], [address], [place], [place] [address]    Blood Alcohol level:  Lab Results  Component Value Date   ETH 109 (H) 01/22/2024   ETH 436 (HH) 12/24/2023    Metabolic Disorder Labs: Lab Results  Component Value  Date   HGBA1C 5.0 01/22/2024   MPG 96.8 01/22/2024   MPG 108 11/07/2014   No results found for: "PROLACTIN" Lab Results  Component Value Date   CHOL 202 (H) 01/22/2024   TRIG 65 01/22/2024   HDL 84 01/22/2024   CHOLHDL 2.4 01/22/2024   VLDL 13 01/22/2024   LDLCALC 105 (H) 01/22/2024    LDLCALC 137 (H) 01/09/2016    Therapeutic Lab Levels: No results found for: "LITHIUM" No results found for: "VALPROATE" No results found for: "CBMZ"  Physical Findings   AUDIT    Flowsheet Row ED from 01/23/2024 in [facility]  Alcohol Use Disorder Identification Test Final Score (AUDIT) 36      GAD-7    Flowsheet Row Counselor from 10/22/2023 in [facility]  Total GAD-7 Score 8      PHQ2-9    Flowsheet Row ED from 01/23/2024 in [facility] ED from 01/22/2024 in [facility] Counselor from 10/22/2023 in [facility] Office Visit from 08/22/2016 in [facility] Visit from 01/09/2016 in [facility]  PHQ-2 Total Score 3 4 0 0 0  PHQ-9 Total Score 14 12 -- -- --      Flowsheet Row ED from 01/23/2024 in [facility] ED from 01/22/2024 in [facility] ED from 12/24/2023 in [facility] Emergency Department at [facility]  C-SSRS RISK CATEGORY Low Risk Error: Q3, 4, or 5 should not be populated when Q2 is No No Risk        Musculoskeletal  Strength & Muscle Tone: within normal limits Gait & Station: normal Patient leans: N/A  Psychiatric Specialty Exam  Presentation  General Appearance:  Casual; Appropriate for Environment  Eye Contact: Fair  Speech: Normal Rate  Speech Volume: Normal  Handedness: Right   Mood and Affect  Mood: Euthymic  Affect: Appropriate   Thought Process  Thought Processes: Linear; Goal Directed  Descriptions of Associations:Intact  Orientation:Full (Time, Place and Person)  Thought Content:Logical  Diagnosis of Schizophrenia or Schizoaffective disorder in past: No    Hallucinations:Hallucinations: None  Ideas of  Reference:None  Suicidal Thoughts:Suicidal Thoughts: No  Homicidal Thoughts:Homicidal Thoughts: No   Sensorium  Memory: Immediate Fair; Recent Fair  Judgment: Fair  Insight: Fair   Art therapist  Concentration: Fair  Attention Span: Fair  Recall: Fair  Fund of Knowledge: Good  Language: Good   Psychomotor Activity  Psychomotor Activity: Psychomotor Activity: Tremor   Assets  Assets: Communication Skills; Desire for Improvement; Physical Health; Transportation   Sleep  Sleep: Sleep: Fair   No data recorded  Physical Exam  Physical Exam ROS Blood pressure (!) 140/90, pulse 90, temperature 97.6 F (36.4 C), temperature source Oral, resp. rate 16, SpO2 100%. There is no height or weight on file to calculate BMI.  Treatment Plan Summary: Daily contact with patient to assess and evaluate symptoms and progress in treatment and Medication management   Trevor [name] is a 43 yr old male who presented on 3/28 to [facility] requesting help for EtOH Abuse, he was admitted to [facility] on 3/29.  PPHx is significant for Depression, Anxiety, EtOH Abuse, and has been to Residential Rehab 4 times, and no history of Suicide Attempts, Self Injurious Behavior, or Psychiatric Hospitalizations.  PMHx is significant for Withdrawal Seizures.     Trevor [name] is  continuing to have withdrawal symptoms-improving tremors but having no signs/symptoms of seizure.  Given his history of withdrawal seizures we will continue with the scheduled Ativan taper to end 4/1.  He is tolerating Zoloft.  He continues to be interested in residential rehab and appreciate social work's assistance with this.  We will not make any other changes to his medications at this time.  We will continue to monitor.     MDD, Recurrent, Moderate  GAD: -Continue Zoloft 50 mg daily for depression and anxiety. -Continue Agitation Protocol: Zyprexa     Withdrawal: -Continue CIWA, last score= 1 -Continue Ativan  taper to end 4/3 -Continue Ativan 1 mg q6 PRN CIWA>10 -Continue Imodium 2-4 mg PRN diarrhea -Continue Zofran-ODT 4 mg q6 PRN nausea -Continue Thiamine 100 mg daily for nutritional supplementation -Continue Multivitamin daily for nutritional supplementation       -Continue PRN's: Tylenol, Maalox, Atarax, Milk of Magnesia, Trazodone     Dispo: Interested in Residential Rehab  Roselle Locus, MD 01/27/2024 12:53 PM

## 2024-01-28 DIAGNOSIS — F1020 Alcohol dependence, uncomplicated: Secondary | ICD-10-CM | POA: Diagnosis not present

## 2024-01-28 DIAGNOSIS — F411 Generalized anxiety disorder: Secondary | ICD-10-CM | POA: Diagnosis not present

## 2024-01-28 DIAGNOSIS — A6002 Herpesviral infection of other male genital organs: Secondary | ICD-10-CM | POA: Diagnosis not present

## 2024-01-28 DIAGNOSIS — F331 Major depressive disorder, recurrent, moderate: Secondary | ICD-10-CM | POA: Diagnosis not present

## 2024-01-28 NOTE — ED Notes (Signed)
 Patient alert & oriented x4. Denies intent to harm self or others when asked. Denies A/VH. Patient reports anxiety 8/10, PRN Atarax given. No acute distress noted. Scheduled and PRN medications administered with no complications. Support and encouragement provided. Routine safety checks conducted per facility protocol. Encouraged patient to notify staff if any thoughts of harm towards self or others arise. Patient verbalizes understanding and agreement.

## 2024-01-28 NOTE — ED Notes (Signed)
 Patient sitting in courtyard interacting with peers and participating in group. No acute distress noted. No concerns voiced. Informed patient to notify staff with any needs or assistance. Patient verbalized understanding or agreement. Safety checks in place per facility policy.

## 2024-01-28 NOTE — Group Note (Signed)
 Group Topic: Social Support  Group Date: 01/28/2024 Start Time: 1500 End Time: 1535 Facilitators: Loyce Dys, NT  Department: [facility]  Number of Participants: 4  Group Focus: goals/reality orientation, healthy friendships, and support Treatment Modality:  Interpersonal Therapy Interventions utilized were support Purpose: express feelings, relapse prevention strategies, and trigger / craving management  Name: DEMAURION DICIOCCIO Date of Birth: Oct 02, 1981  MR: [id number]    Level of Participation: active Quality of Participation: attentive and cooperative Interactions with others: gave feedback Mood/Affect: appropriate and positive Triggers (if applicable): n/a Cognition: concrete and goal directed Progress: Gaining insight Response: PT stated that he wanted to be there for his child and he would tell his younger self to "never start drinking" Plan: patient will be encouraged to attend future group sessions  Patients Problems:  Patient Active Problem List   Diagnosis Date Noted   Alcohol use disorder 01/23/2024   Hyponatremia 06/19/2022   Transaminitis 06/18/2022   GERD (gastroesophageal reflux disease) 06/18/2022   Hypokalemia 06/18/2022   Loss of consciousness (HCC) 06/18/2022   QT prolongation 06/18/2022   Chronic hepatitis C without hepatic coma (HCC) 06/28/2019   Alcohol abuse 06/28/2019   Psoriasis 06/07/2019   Suicidal ideation 06/07/2019   Withdrawal symptoms, alcohol, with delirium (HCC) 06/07/2019   Alcoholic hepatitis without ascites 06/05/2019   Alcohol withdrawal (HCC) 06/03/2019   Obesity (BMI 30-39.9) 01/09/2016   HSV-2 (herpes simplex virus 2) infection

## 2024-01-28 NOTE — ED Notes (Signed)
 PRN Atarax given due to patient reports of anxiety rating 8/10. Medication administered with no complications. Environment secured, safety checks in place per facility policy.

## 2024-01-28 NOTE — Group Note (Signed)
 Group Topic: Social Support  Group Date: 01/28/2024 Start Time: 1720 End Time: 1730 Facilitators: Kennet Mccort, Jacklynn Barnacle, RN  Department: [facility]  Number of Participants: 1  Group Focus: check in Treatment Modality:  Individual Therapy Interventions utilized were support Purpose: express feelings  Name: Trevor [name] Date of Birth: February 15, 1981  MR: [id number]    Level of Participation: moderate Quality of Participation: attentive and cooperative Interactions with others: gave feedback Mood/Affect: appropriate Triggers (if applicable): None identified Cognition: coherent/clear Progress: Gaining insight Response: Patient states they are doing okay on the unit. Patient had earlier reported anxiety and received PRN medications and states they "doing great after those meds". Pleasant demeanor, no complaints at this time. Plan: patient will be encouraged to reach out to staff as needs arise, continue to participate in programming on the unit  Patients Problems:  Patient Active Problem List   Diagnosis Date Noted   Alcohol use disorder 01/23/2024   Hyponatremia 06/19/2022   Transaminitis 06/18/2022   GERD (gastroesophageal reflux disease) 06/18/2022   Hypokalemia 06/18/2022   Loss of consciousness (HCC) 06/18/2022   QT prolongation 06/18/2022   Chronic hepatitis C without hepatic coma (HCC) 06/28/2019   Alcohol abuse 06/28/2019   Psoriasis 06/07/2019   Suicidal ideation 06/07/2019   Withdrawal symptoms, alcohol, with delirium (HCC) 06/07/2019   Alcoholic hepatitis without ascites 06/05/2019   Alcohol withdrawal (HCC) 06/03/2019   Obesity (BMI 30-39.9) 01/09/2016   HSV-2 (herpes simplex virus 2) infection

## 2024-01-28 NOTE — ED Notes (Signed)
 Patient in hallway on the phone, calm and collected. No acute distress noted. No concerns voiced. Informed patient to notify staff with any needs or assistance. Patient verbalized understanding or agreement. Safety checks in place per facility policy.

## 2024-01-28 NOTE — ED Notes (Signed)
 Patient seen sitting in the day area interacting with peers. He is calm and cooperative without any distress noted. He reports having anxiety 8 and denies depression SIHI, and AVH. He reports sleeping off and on during the night and denies having nightmares and reports eating okay. He reports withdrawal symptoms of anxiety and tremors. Staff will continue to monitor safety per protocol and for changes in his condition.

## 2024-01-28 NOTE — ED Notes (Signed)
 Patient is resting in bed with eyes closed without any distress or s/s of discomfort. Staff will continue to monitor safety per protocol and for changes in his condition.

## 2024-01-28 NOTE — ED Notes (Signed)
 Patient sitting in dayroom calm and composed. No acute distress noted. No concerns voiced. Informed patient to notify staff with any needs or assistance. Patient verbalized understanding or agreement. Safety checks in place per facility policy.

## 2024-01-28 NOTE — Group Note (Signed)
 Group Topic: Relapse and Recovery  Group Date: 01/25/2024 Start Time: 1930 End Time: 2015 Facilitators: Debe Coder, NT  Department: [facility]  Number of Participants: 5  Group Focus: communication Treatment Modality:  Individual Therapy Interventions utilized were group exercise Purpose: enhance coping skills  Name: ZAMAURI NEZ Date of Birth: [birth year]  MR: [id number]    Level of Participation: active Quality of Participation: engaged Interactions with others: gave feedback Mood/Affect: appropriate Triggers (if applicable):  Cognition: coherent/clear Progress: Significant Response:  Plan: follow-up needed  Patients Problems:  Patient Active Problem List   Diagnosis Date Noted   Alcohol use disorder 01/23/2024   Hyponatremia 06/19/2022   Transaminitis 06/18/2022   GERD (gastroesophageal reflux disease) 06/18/2022   Hypokalemia 06/18/2022   Loss of consciousness (HCC) 06/18/2022   QT prolongation 06/18/2022   Chronic hepatitis C without hepatic coma (HCC) 06/28/2019   Alcohol abuse 06/28/2019   Psoriasis 06/07/2019   Suicidal ideation 06/07/2019   Withdrawal symptoms, alcohol, with delirium (HCC) 06/07/2019   Alcoholic hepatitis without ascites 06/05/2019   Alcohol withdrawal (HCC) 06/03/2019   Obesity (BMI 30-39.9) 01/09/2016   HSV-2 (herpes simplex virus 2) infection

## 2024-01-28 NOTE — Discharge Planning (Signed)
 LCSW spoke with patient on this morning to confirm disposition plan to [facility] on February 02, 2024.  Patient expressed understanding and appreciation for LCSW assistance.  Patient explored if LCSW could print off information for [facility] in [place], [place] in order for him to read more about the facility and possible consideration after discharge from [facility].  Information was handed to the patient on today for his review.  No other needs were reported by the patient once a day.  LCSW will continue to follow and provide support to patient while on [facility] unit.  Fernande Boyden, LCSW Clinical Social Worker [facility] Ph: [phone]

## 2024-01-28 NOTE — ED Notes (Signed)
 PRN Atarax given due to patient reports of anxiety. Medication administered with no complications. Environment secured, safety checks in place per facility policy.

## 2024-01-28 NOTE — Group Note (Signed)
 Group Topic: Understanding Self  Group Date: 01/28/2024 Start Time: 0120 End Time: 0150 Facilitators: Loleta Dicker, LCSW  Department: [facility]  Number of Participants: 4  Group Focus: feeling awareness/expression Treatment Modality:  Cognitive Behavioral Therapy Interventions utilized were story telling and support Purpose: increase insight, regain self-worth, and reinforce self-care  Name: Trevor [name] Date of Birth: 04-25-1981  MR: [id number]    Level of Participation: active Quality of Participation: attentive and cooperative Interactions with others: gave feedback Mood/Affect: appropriate Triggers (if applicable): N/A Cognition: coherent/clear Progress: Gaining insight Response: Patient participated in group on today. Participants expressed understanding of group rules. Participants discussed the importance having humility and being considerate of other people. Participants spoke about the things that want to be aware of moving forward when making decisions about their life. Patient reports fixing his perspective and being more aware of hwo he treats other people. No issues to report regarding this patient.  Plan: referral / recommendations  Patients Problems:  Patient Active Problem List   Diagnosis Date Noted   Alcohol use disorder 01/23/2024   Hyponatremia 06/19/2022   Transaminitis 06/18/2022   GERD (gastroesophageal reflux disease) 06/18/2022   Hypokalemia 06/18/2022   Loss of consciousness (HCC) 06/18/2022   QT prolongation 06/18/2022   Chronic hepatitis C without hepatic coma (HCC) 06/28/2019   Alcohol abuse 06/28/2019   Psoriasis 06/07/2019   Suicidal ideation 06/07/2019   Withdrawal symptoms, alcohol, with delirium (HCC) 06/07/2019   Alcoholic hepatitis without ascites 06/05/2019   Alcohol withdrawal (HCC) 06/03/2019   Obesity (BMI 30-39.9) 01/09/2016   HSV-2 (herpes simplex virus 2) infection

## 2024-01-28 NOTE — ED Provider Notes (Signed)
 Behavioral Health Progress Note  Date and Time: 01/28/2024 11:10 AM Name: Trevor [name] MRN:  [id number]  Subjective:  [name]. Goheen is a 43 yr old male who presented on 3/28 to [facility] requesting help for EtOH Abuse, he was admitted to [facility] on 3/29 for alcohol detox and rehab services. PPHx is significant for Depression, Anxiety, EtOH Abuse, and has been to Residential Rehab 4 times, and no history of Suicide Attempts, Self Injurious Behavior, or Psychiatric Hospitalizations. PMHx is significant for Withdrawal Seizures.   Pt seen in the milieu this AM, no acute distress. He still notices some tremor and night sweats but otherwise feels improvement physically and mentally. He reports good sleep and appetite. He states feeling anxious and denies a trigger for it. We discussed coping skills that he can use when he has increased anxiety like deep breathing and mindfulness techniques. I also discussed how his Ativan taper also finished yesterday which could be contributing to increased anxiety. He feels happy that he will be going to [facility] and he plans to look into a long term treatment facility after completing [facility]. He denies SI, HI, and AVH.  Diagnosis:  Final diagnoses:  Alcohol abuse  Moderate episode of recurrent major depressive disorder (HCC)  GAD (generalized anxiety disorder)    Total Time spent with patient: 20 minutes  Past Psychiatric History: Depression, Anxiety, EtOH Abuse, and has been to Residential Rehab 4 times, and no history of Suicide Attempts, Self Injurious Behavior, or Psychiatric Hospitalizations.    Past Medical History:  Depression, Anxiety, EtOH Abuse, and has been to Residential Rehab 4 times, and no history of Suicide Attempts, Self Injurious Behavior, or Psychiatric Hospitalizations.    Family History:  Mother- Skin Cancer Paternal Grandfather- Colon Cancer Paternal Grandmother and Grandfather- Lung Cancer   Family Psychiatric  History:  Brother- EtOH  Abuse Maternal Grandfather and Grandmother- EtOH Abuse No Known Diagnosis' or Suicides   Social History:  Divorced, has a daughter. Recently fired from his job. Started drinking at age 89, longest period of sobriety 6 months, been to Residential Rehab 4 times.     Sleep: Fair  Appetite:  Fair  Current Medications:  Current Facility-Administered Medications  Medication Dose Route Frequency Provider Last Rate Last Admin   acetaminophen (TYLENOL) tablet 650 mg  650 mg Oral Q6H PRN Marlou Sa, NP       alum & mag hydroxide-simeth (MAALOX/MYLANTA) 200-200-20 MG/5ML suspension 30 mL  30 mL Oral Q4H PRN Rayburn Go, Veronique M, NP       gabapentin (NEURONTIN) capsule 200 mg  200 mg Oral TID Miguel Rota, MD   200 mg at 01/28/24 0914   hydrOXYzine (ATARAX) tablet 25 mg  25 mg Oral Q6H PRN Miguel Rota, MD   25 mg at 01/28/24 0915   magnesium hydroxide (MILK OF MAGNESIA) suspension 30 mL  30 mL Oral Daily PRN Marlou Sa, NP   30 mL at 01/25/24 1309   multivitamin with minerals tablet 1 tablet  1 tablet Oral Daily Marlou Sa, NP   1 tablet at 01/28/24 0914   OLANZapine (ZYPREXA) injection 10 mg  10 mg Intramuscular TID PRN Marlou Sa, NP       OLANZapine (ZYPREXA) injection 5 mg  5 mg Intramuscular TID PRN Rayburn Go, Veronique M, NP       OLANZapine zydis (ZYPREXA) disintegrating tablet 5 mg  5 mg Oral TID PRN Marlou Sa, NP       sertraline (ZOLOFT) tablet  50 mg  50 mg Oral Daily Lauro Franklin, MD   50 mg at 01/28/24 1610   thiamine (VITAMIN B1) tablet 100 mg  100 mg Oral Daily Olin Pia M, NP   100 mg at 01/28/24 0914   traZODone (DESYREL) tablet 50 mg  50 mg Oral QHS PRN Marlou Sa, NP   50 mg at 01/27/24 2108   Current Outpatient Medications  Medication Sig Dispense Refill   famotidine (PEPCID) 20 MG tablet Take 20 mg by mouth 2 (two) times daily. (Patient not taking: Reported on 01/22/2024)     hydrOXYzine  (ATARAX) 50 MG tablet Take 50 mg by mouth every 8 (eight) hours as needed for anxiety. (Patient not taking: Reported on 01/22/2024)     Multiple Vitamin (MULTIVITAMIN WITH MINERALS) TABS tablet Take 1 tablet by mouth daily.     thiamine (VITAMIN B1) 100 MG tablet Take 1 tablet (100 mg total) by mouth daily. (Patient not taking: Reported on 01/22/2024) 30 tablet 0   traZODone (DESYREL) 150 MG tablet Take 150 mg by mouth at bedtime. (Patient not taking: Reported on 01/22/2024)      Labs  Lab Results:  Admission on 01/22/2024, Discharged on 01/23/2024  Component Date Value Ref Range Status   WBC 01/22/2024 6.8  4.0 - 10.5 K/uL Final   RBC 01/22/2024 5.22  4.22 - 5.81 MIL/uL Final   Hemoglobin 01/22/2024 17.2 (H)  13.0 - 17.0 g/dL Final   HCT 96/01/5408 47.8  39.0 - 52.0 % Final   MCV 01/22/2024 91.6  80.0 - 100.0 fL Final   MCH 01/22/2024 33.0  26.0 - 34.0 pg Final   MCHC 01/22/2024 36.0  30.0 - 36.0 g/dL Final   RDW 81/19/1478 12.1  11.5 - 15.5 % Final   Platelets 01/22/2024 319  150 - 400 K/uL Final   nRBC 01/22/2024 0.0  0.0 - 0.2 % Final   Neutrophils Relative % 01/22/2024 70  % Final   Neutro Abs 01/22/2024 4.8  1.7 - 7.7 K/uL Final   Lymphocytes Relative 01/22/2024 25  % Final   Lymphs Abs 01/22/2024 1.7  0.7 - 4.0 K/uL Final   Monocytes Relative 01/22/2024 4  % Final   Monocytes Absolute 01/22/2024 0.3  0.1 - 1.0 K/uL Final   Eosinophils Relative 01/22/2024 0  % Final   Eosinophils Absolute 01/22/2024 0.0  0.0 - 0.5 K/uL Final   Basophils Relative 01/22/2024 1  % Final   Basophils Absolute 01/22/2024 0.1  0.0 - 0.1 K/uL Final   Immature Granulocytes 01/22/2024 0  % Final   Abs Immature Granulocytes 01/22/2024 0.02  0.00 - 0.07 K/uL Final   Performed at [facility], [address] [place], [place] [address]   Sodium 01/22/2024 138  135 - 145 mmol/L Final   Potassium 01/22/2024 3.9  3.5 - 5.1 mmol/L Final   Chloride 01/22/2024 99  98 - 111 mmol/L Final   CO2 01/22/2024 25   22 - 32 mmol/L Final   Glucose, Bld 01/22/2024 99  70 - 99 mg/dL Final   Glucose reference range applies only to samples taken after fasting for at least 8 hours.   BUN 01/22/2024 13  6 - 20 mg/dL Final   Creatinine, Ser 01/22/2024 0.83  0.61 - 1.24 mg/dL Final   Calcium 13/05/6577 8.9  8.9 - 10.3 mg/dL Final   Total Protein 46/96/2952 7.5  6.5 - 8.1 g/dL Final   Albumin 84/13/2440 4.6  3.5 - 5.0 g/dL Final  AST 01/22/2024 44 (H)  15 - 41 U/L Final   ALT 01/22/2024 46 (H)  0 - 44 U/L Final   Alkaline Phosphatase 01/22/2024 60  38 - 126 U/L Final   Total Bilirubin 01/22/2024 0.8  0.0 - 1.2 mg/dL Final   GFR, Estimated 01/22/2024 >60  >60 mL/min Final   Comment: (NOTE) Calculated using the CKD-EPI Creatinine Equation (2021)    Anion gap 01/22/2024 14  5 - 15 Final   Performed at [facility], [address] [place], [place] [address]   Hgb A1c MFr Bld 01/22/2024 5.0  4.8 - 5.6 % Final   Comment: (NOTE) Pre diabetes:          5.7%-6.4%  Diabetes:              >6.4%  Glycemic control for   <7.0% adults with diabetes    Mean Plasma Glucose 01/22/2024 96.8  mg/dL Final   Performed at [facility], [address] [place], [place] [address]   Magnesium 01/22/2024 1.9  1.7 - 2.4 mg/dL Final   Performed at [facility], [address], [place], [place] [address]   Alcohol, Ethyl (B) 01/22/2024 109 (H)  <10 mg/dL Final   Comment: (NOTE) Lowest detectable limit for serum alcohol is 10 mg/dL.  For medical purposes only. Performed at [facility], [address], [place], [place] [address]    Cholesterol 01/22/2024 202 (H)  0 - 200 mg/dL Final   Triglycerides 96/29/5284 65  <150 mg/dL Final   HDL 13/24/4010 84  >40 mg/dL Final   Total CHOL/HDL Ratio 01/22/2024 2.4  RATIO Final   VLDL 01/22/2024 13  0 - 40 mg/dL Final   LDL Cholesterol 01/22/2024 105 (H)  0 - 99 mg/dL Final   Comment:        Total Cholesterol/HDL:CHD Risk Coronary Heart Disease Risk Table                      Men   Women  1/2 Average Risk   3.4   3.3  Average Risk       5.0   4.4  2 X Average Risk   9.6   7.1  3 X Average Risk  23.4   11.0        Use the calculated Patient Ratio above and the CHD Risk Table to determine the patient's CHD Risk.        ATP III CLASSIFICATION (LDL):  <100     mg/dL   Optimal  272-536  mg/dL   Near or Above                    Optimal  130-159  mg/dL   Borderline  644-034  mg/dL   High  >742     mg/dL   Very High Performed at [facility], [address] [place], [place] [address]    TSH 01/22/2024 0.458  0.350 - 4.500 uIU/mL Final   Comment: Performed by a 3rd Generation assay with a functional sensitivity of <=0.01 uIU/mL. Performed at [facility], [address] [place], [place] [address]    RPR Ser Ql 01/22/2024 NON REACTIVE  NON REACTIVE Final   Performed at [facility], [address], [place], [place] [address]   Color, Urine 01/22/2024 YELLOW  YELLOW Final   APPearance 01/22/2024 CLEAR  CLEAR Final   Specific Gravity, Urine  01/22/2024 1.020  1.005 - 1.030 Final   pH 01/22/2024 5.0  5.0 - 8.0 Final   Glucose, UA 01/22/2024 NEGATIVE  NEGATIVE mg/dL Final   Hgb urine dipstick 01/22/2024 NEGATIVE  NEGATIVE Final   Bilirubin Urine 01/22/2024 NEGATIVE  NEGATIVE Final   Ketones, ur 01/22/2024 5 (A)  NEGATIVE mg/dL Final   Protein, ur 25/95/6387 NEGATIVE  NEGATIVE mg/dL Final   Nitrite 56/43/3295 NEGATIVE  NEGATIVE Final   Leukocytes,Ua 01/22/2024 NEGATIVE  NEGATIVE Final   Performed at [facility], [address] [place], [place] [address]   POC Amphetamine UR 01/22/2024 None Detected   Final   POC Secobarbital (BAR) 01/22/2024 None Detected   Final   POC Buprenorphine (BUP) 01/22/2024 None Detected   Final   POC Oxazepam (BZO) 01/22/2024 None Detected   Final   POC Cocaine UR 01/22/2024 None Detected   Final   POC Methamphetamine UR 01/22/2024 None Detected   Final   POC Morphine 01/22/2024 None  Detected   Final   POC Methadone UR 01/22/2024 None Detected   Final   POC Oxycodone UR 01/22/2024 None Detected   Final   POC Marijuana UR 01/22/2024 None Detected   Final  Admission on 12/24/2023, Discharged on 12/25/2023  Component Date Value Ref Range Status   Sodium 12/24/2023 144  135 - 145 mmol/L Final   Potassium 12/24/2023 3.8  3.5 - 5.1 mmol/L Final   Chloride 12/24/2023 107  98 - 111 mmol/L Final   CO2 12/24/2023 26  22 - 32 mmol/L Final   Glucose, Bld 12/24/2023 96  70 - 99 mg/dL Final   Glucose reference range applies only to samples taken after fasting for at least 8 hours.   BUN 12/24/2023 11  6 - 20 mg/dL Final   Creatinine, Ser 12/24/2023 0.62  0.61 - 1.24 mg/dL Final   Calcium 66/03/3015 8.6 (L)  8.9 - 10.3 mg/dL Final   Total Protein 11/04/3233 7.8  6.5 - 8.1 g/dL Final   Albumin 57/32/2025 4.3  3.5 - 5.0 g/dL Final   AST 42/70/6237 34  15 - 41 U/L Final   ALT 12/24/2023 27  0 - 44 U/L Final   Alkaline Phosphatase 12/24/2023 56  38 - 126 U/L Final   Total Bilirubin 12/24/2023 0.4  0.0 - 1.2 mg/dL Final   GFR, Estimated 12/24/2023 >60  >60 mL/min Final   Comment: (NOTE) Calculated using the CKD-EPI Creatinine Equation (2021)    Anion gap 12/24/2023 11  5 - 15 Final   Performed at [facility], [address], [place], [place] [address]   Alcohol, Ethyl (B) 12/24/2023 436 (HH)  <10 mg/dL Final   Comment: CRITICAL RESULT CALLED TO, READ BACK BY AND VERIFIED WITH Barnett Applebaum, RN 12/25/23 0050 BY K. DAVIS (NOTE) Lowest detectable limit for serum alcohol is 10 mg/dL.  For medical purposes only. Performed at [facility], [address], [place], [place] [address]    Opiates 12/25/2023 NONE DETECTED  NONE DETECTED Final   Cocaine 12/25/2023 NONE DETECTED  NONE DETECTED Final   Benzodiazepines 12/25/2023 NONE DETECTED  NONE DETECTED Final   Amphetamines 12/25/2023 NONE DETECTED  NONE DETECTED Final   Tetrahydrocannabinol 12/25/2023  NONE DETECTED  NONE DETECTED Final   Barbiturates 12/25/2023 NONE DETECTED  NONE DETECTED Final   Comment: (NOTE) DRUG SCREEN FOR MEDICAL PURPOSES ONLY.  IF CONFIRMATION IS NEEDED FOR ANY PURPOSE, NOTIFY LAB WITHIN 5 DAYS.  LOWEST DETECTABLE LIMITS FOR URINE DRUG  SCREEN Drug Class                     Cutoff (ng/mL) Amphetamine and metabolites    1000 Barbiturate and metabolites    200 Benzodiazepine                 200 Opiates and metabolites        300 Cocaine and metabolites        300 THC                            50 Performed at [facility], [address] [place], [place] [address]    WBC 12/24/2023 5.1  4.0 - 10.5 K/uL Final   RBC 12/24/2023 4.78  4.22 - 5.81 MIL/uL Final   Hemoglobin 12/24/2023 16.0  13.0 - 17.0 g/dL Final   HCT 62/13/0865 45.7  39.0 - 52.0 % Final   MCV 12/24/2023 95.6  80.0 - 100.0 fL Final   MCH 12/24/2023 33.5  26.0 - 34.0 pg Final   MCHC 12/24/2023 35.0  30.0 - 36.0 g/dL Final   RDW 78/46/9629 12.8  11.5 - 15.5 % Final   Platelets 12/24/2023 292  150 - 400 K/uL Final   nRBC 12/24/2023 0.0  0.0 - 0.2 % Final   Neutrophils Relative % 12/24/2023 61  % Final   Neutro Abs 12/24/2023 3.0  1.7 - 7.7 K/uL Final   Lymphocytes Relative 12/24/2023 31  % Final   Lymphs Abs 12/24/2023 1.6  0.7 - 4.0 K/uL Final   Monocytes Relative 12/24/2023 8  % Final   Monocytes Absolute 12/24/2023 0.4  0.1 - 1.0 K/uL Final   Eosinophils Relative 12/24/2023 0  % Final   Eosinophils Absolute 12/24/2023 0.0  0.0 - 0.5 K/uL Final   Basophils Relative 12/24/2023 0  % Final   Basophils Absolute 12/24/2023 0.0  0.0 - 0.1 K/uL Final   Immature Granulocytes 12/24/2023 0  % Final   Abs Immature Granulocytes 12/24/2023 0.02  0.00 - 0.07 K/uL Final   Performed at [facility], [address] [place], [place] [address]   Lipase 12/24/2023 41  11 - 51 U/L Final   Performed at [facility], [address], [place],  [place] [address]   Color, Urine 12/25/2023 YELLOW  YELLOW Final   APPearance 12/25/2023 HAZY (A)  CLEAR Final   Specific Gravity, Urine 12/25/2023 1.010  1.005 - 1.030 Final   pH 12/25/2023 7.0  5.0 - 8.0 Final   Glucose, UA 12/25/2023 NEGATIVE  NEGATIVE mg/dL Final   Hgb urine dipstick 12/25/2023 NEGATIVE  NEGATIVE Final   Bilirubin Urine 12/25/2023 NEGATIVE  NEGATIVE Final   Ketones, ur 12/25/2023 NEGATIVE  NEGATIVE mg/dL Final   Protein, ur 08/23/2535 NEGATIVE  NEGATIVE mg/dL Final   Nitrite 64/40/3474 NEGATIVE  NEGATIVE Final   Leukocytes,Ua 12/25/2023 NEGATIVE  NEGATIVE Final   Performed at [facility], [address] [place], [place] [address]  Admission on 09/23/2023, Discharged on 09/29/2023  Component Date Value Ref Range Status   WBC 09/23/2023 5.2  4.0 - 10.5 K/uL Final   RBC 09/23/2023 5.10  4.22 - 5.81 MIL/uL Final   Hemoglobin 09/23/2023 16.9  13.0 - 17.0 g/dL Final   HCT 38/75/6433 46.5  39.0 - 52.0 % Final   MCV 09/23/2023 91.2  80.0 - 100.0 fL Final   MCH 09/23/2023 33.1  26.0 - 34.0  pg Final   MCHC 09/23/2023 36.3 (H)  30.0 - 36.0 g/dL Final   RDW 78/29/5621 12.9  11.5 - 15.5 % Final   Platelets 09/23/2023 185  150 - 400 K/uL Final   nRBC 09/23/2023 0.0  0.0 - 0.2 % Final   Performed at [facility], [address], [place], [place] [address]   Sodium 09/23/2023 139  135 - 145 mmol/L Final   Potassium 09/23/2023 3.5  3.5 - 5.1 mmol/L Final   Chloride 09/23/2023 99  98 - 111 mmol/L Final   CO2 09/23/2023 22  22 - 32 mmol/L Final   Glucose, Bld 09/23/2023 169 (H)  70 - 99 mg/dL Final   Glucose reference range applies only to samples taken after fasting for at least 8 hours.   BUN 09/23/2023 6  6 - 20 mg/dL Final   Creatinine, Ser 09/23/2023 0.71  0.61 - 1.24 mg/dL Final   Calcium 78/46/9629 8.4 (L)  8.9 - 10.3 mg/dL Final   Total Protein 52/84/1324 6.9  6.5 - 8.1 g/dL Final   Albumin 40/07/2724 4.0  3.5 - 5.0 g/dL Final   AST 36/64/4034 135 (H)   15 - 41 U/L Final   ALT 09/23/2023 76 (H)  0 - 44 U/L Final   Alkaline Phosphatase 09/23/2023 58  38 - 126 U/L Final   Total Bilirubin 09/23/2023 0.9  <1.2 mg/dL Final   GFR, Estimated 09/23/2023 >60  >60 mL/min Final   Comment: (NOTE) Calculated using the CKD-EPI Creatinine Equation (2021)    Anion gap 09/23/2023 18 (H)  5 - 15 Final   Performed at [facility], [address], [place], [place] [address]   Troponin I (High Sensitivity) 09/23/2023 14  <18 ng/L Final   Comment: (NOTE) Elevated high sensitivity troponin I (hsTnI) values and significant  changes across serial measurements may suggest ACS but many other  chronic and acute conditions are known to elevate hsTnI results.  Refer to the "Links" section for chest pain algorithms and additional  guidance. Performed at [facility], [address] [place], [place] [address]    Alcohol, Ethyl (B) 09/23/2023 412 (HH)  <10 mg/dL Final   Comment: CRITICAL RESULT CALLED TO, READ BACK BY AND VERIFIED WITH B. OSORIO, RN AT [phone] 11.27.24 JLASIGAN (NOTE) Lowest detectable limit for serum alcohol is 10 mg/dL.  For medical purposes only. Performed at [facility], [address], [place], [place] [address]    Lipase 09/23/2023 86 (H)  11 - 51 U/L Final   Performed at [facility], [address] [place], [place] [address]   Troponin I (High Sensitivity) 09/23/2023 19 (H)  <18 ng/L Final   Comment: (NOTE) Elevated high sensitivity troponin I (hsTnI) values and significant  changes across serial measurements may suggest ACS but many other  chronic and acute conditions are known to elevate hsTnI results.  Refer to the "Links" section for chest pain algorithms and additional  guidance. Performed at [facility], [address] [place], [place] [address]    Magnesium 09/23/2023 1.8  1.7 - 2.4 mg/dL Final   Performed at [facility], [address], [place], [place] [address]   Phosphorus  09/23/2023 2.2 (L)  2.5 - 4.6 mg/dL Final   Performed at [facility], [address] [place], [place] [address]   Opiates 09/23/2023 NONE DETECTED  NONE DETECTED Final   Cocaine 09/23/2023 NONE DETECTED  NONE DETECTED Final   Benzodiazepines 09/23/2023 NONE DETECTED  NONE DETECTED Final   Amphetamines 09/23/2023 NONE DETECTED  NONE DETECTED Final   Tetrahydrocannabinol 09/23/2023 NONE DETECTED  NONE DETECTED Final   Barbiturates 09/23/2023 NONE DETECTED  NONE DETECTED Final   Comment: (NOTE) DRUG SCREEN FOR MEDICAL PURPOSES ONLY.  IF CONFIRMATION IS NEEDED FOR ANY PURPOSE, NOTIFY LAB WITHIN 5 DAYS.  LOWEST DETECTABLE LIMITS FOR URINE DRUG SCREEN Drug Class                     Cutoff (ng/mL) Amphetamine and metabolites    1000 Barbiturate and metabolites    200 Benzodiazepine                 200 Opiates and metabolites        300 Cocaine and metabolites        300 THC                            50 Performed at [facility], [address] [place], [place] [address]    HIV Screen 4th Generation wRfx 09/23/2023 Non Reactive  Non Reactive Final   Performed at [facility], [address] [place], [place] [address]   Phosphorus 09/24/2023 3.1  2.5 - 4.6 mg/dL Final   Performed at [facility], [address] [place], [place] [address]   Lipase 09/24/2023 92 (H)  11 - 51 U/L Final   Performed at [facility], [address], [place], [place] [address]   Sodium 09/24/2023 130 (L)  135 - 145 mmol/L Final   DELTA CHECK NOTED   Potassium 09/24/2023 3.8  3.5 - 5.1 mmol/L Final   Chloride 09/24/2023 98  98 - 111 mmol/L Final   CO2 09/24/2023 24  22 - 32 mmol/L Final   Glucose, Bld 09/24/2023 94  70 - 99 mg/dL Final   Glucose reference range applies only to samples taken after fasting for at least 8 hours.   BUN 09/24/2023 9  6 - 20 mg/dL Final   Creatinine, Ser 09/24/2023 0.63  0.61 - 1.24 mg/dL Final   Calcium 29/56/2130 8.5 (L)  8.9 - 10.3 mg/dL Final    Total Protein 09/24/2023 5.6 (L)  6.5 - 8.1 g/dL Final   Albumin 86/57/8469 3.2 (L)  3.5 - 5.0 g/dL Final   AST 62/95/2841 78 (H)  15 - 41 U/L Final   ALT 09/24/2023 54 (H)  0 - 44 U/L Final   Alkaline Phosphatase 09/24/2023 42  38 - 126 U/L Final   Total Bilirubin 09/24/2023 1.2 (H)  <1.2 mg/dL Final   GFR, Estimated 09/24/2023 >60  >60 mL/min Final   Comment: (NOTE) Calculated using the CKD-EPI Creatinine Equation (2021)    Anion gap 09/24/2023 8  5 - 15 Final   Performed at [facility], [address] [place], [place] [address]   WBC 09/24/2023 4.1  4.0 - 10.5 K/uL Final   RBC 09/24/2023 3.97 (L)  4.22 - 5.81 MIL/uL Final   Hemoglobin 09/24/2023 13.2  13.0 - 17.0 g/dL Final   HCT 08/23/2535 37.2 (L)  39.0 - 52.0 % Final   MCV 09/24/2023 93.7  80.0 - 100.0 fL Final   MCH 09/24/2023 33.2  26.0 - 34.0 pg Final   MCHC 09/24/2023 35.5  30.0 - 36.0 g/dL Final   RDW 64/40/3474 12.8  11.5 - 15.5 %  Final   Platelets 09/24/2023 130 (L)  150 - 400 K/uL Final   nRBC 09/24/2023 0.0  0.0 - 0.2 % Final   Neutrophils Relative % 09/24/2023 60  % Final   Neutro Abs 09/24/2023 2.5  1.7 - 7.7 K/uL Final   Lymphocytes Relative 09/24/2023 27  % Final   Lymphs Abs 09/24/2023 1.1  0.7 - 4.0 K/uL Final   Monocytes Relative 09/24/2023 11  % Final   Monocytes Absolute 09/24/2023 0.4  0.1 - 1.0 K/uL Final   Eosinophils Relative 09/24/2023 1  % Final   Eosinophils Absolute 09/24/2023 0.0  0.0 - 0.5 K/uL Final   Basophils Relative 09/24/2023 1  % Final   Basophils Absolute 09/24/2023 0.0  0.0 - 0.1 K/uL Final   Immature Granulocytes 09/24/2023 0  % Final   Abs Immature Granulocytes 09/24/2023 0.01  0.00 - 0.07 K/uL Final   Performed at [facility], [address], [place], [place] [address]   Magnesium 09/24/2023 1.6 (L)  1.7 - 2.4 mg/dL Final   Performed at [facility], [address], [place], [place] [address]   WBC 09/25/2023 5.1  4.0 - 10.5 K/uL Final   RBC 09/25/2023 4.20  (L)  4.22 - 5.81 MIL/uL Final   Hemoglobin 09/25/2023 13.5  13.0 - 17.0 g/dL Final   HCT 09/81/1914 40.0  39.0 - 52.0 % Final   MCV 09/25/2023 95.2  80.0 - 100.0 fL Final   MCH 09/25/2023 32.1  26.0 - 34.0 pg Final   MCHC 09/25/2023 33.8  30.0 - 36.0 g/dL Final   RDW 78/29/5621 13.1  11.5 - 15.5 % Final   Platelets 09/25/2023 138 (L)  150 - 400 K/uL Final   REPEATED TO VERIFY   nRBC 09/25/2023 0.0  0.0 - 0.2 % Final   Performed at [facility], [address] [place], [place] [address]   Sodium 09/25/2023 134 (L)  135 - 145 mmol/L Final   Potassium 09/25/2023 3.9  3.5 - 5.1 mmol/L Final   Chloride 09/25/2023 102  98 - 111 mmol/L Final   CO2 09/25/2023 22  22 - 32 mmol/L Final   Glucose, Bld 09/25/2023 92  70 - 99 mg/dL Final   Glucose reference range applies only to samples taken after fasting for at least 8 hours.   BUN 09/25/2023 12  6 - 20 mg/dL Final   Creatinine, Ser 09/25/2023 0.61  0.61 - 1.24 mg/dL Final   Calcium 78/46/9629 9.0  8.9 - 10.3 mg/dL Final   GFR, Estimated 09/25/2023 >60  >60 mL/min Final   Comment: (NOTE) Calculated using the CKD-EPI Creatinine Equation (2021)    Anion gap 09/25/2023 10  5 - 15 Final   Performed at [facility], [address] [place], [place] [address]   Phosphorus 09/25/2023 5.5 (H)  2.5 - 4.6 mg/dL Final   Performed at [facility], [address], [place], [place] [address]   Magnesium 09/25/2023 2.2  1.7 - 2.4 mg/dL Final   Performed at [facility], [address], [place], [place] [address]   Glucose-Capillary 09/25/2023 88  70 - 99 mg/dL Final   Glucose reference range applies only to samples taken after fasting for at least 8 hours.   Glucose-Capillary 09/25/2023 101 (H)  70 - 99 mg/dL Final   Glucose reference range applies only to samples taken after fasting for at least 8 hours.   Glucose-Capillary 09/25/2023 109 (H)  70 - 99 mg/dL Final   Glucose reference range applies only to samples taken after fasting  for at least 8 hours.   WBC 09/26/2023 5.2  4.0 - 10.5 K/uL Final   RBC 09/26/2023 4.26  4.22 - 5.81 MIL/uL Final   Hemoglobin 09/26/2023 14.0  13.0 - 17.0 g/dL Final   HCT 08/65/7846 40.3  39.0 - 52.0 % Final   MCV 09/26/2023 94.6  80.0 - 100.0 fL Final   MCH 09/26/2023 32.9  26.0 - 34.0 pg Final   MCHC 09/26/2023 34.7  30.0 - 36.0 g/dL Final   RDW 96/29/5284 13.1  11.5 - 15.5 % Final   Platelets 09/26/2023 143 (L)  150 - 400 K/uL Final   nRBC 09/26/2023 0.0  0.0 - 0.2 % Final   Performed at [facility], [address], [place], [place] [address]   Sodium 09/26/2023 134 (L)  135 - 145 mmol/L Final   Potassium 09/26/2023 4.4  3.5 - 5.1 mmol/L Final   Chloride 09/26/2023 106  98 - 111 mmol/L Final   CO2 09/26/2023 21 (L)  22 - 32 mmol/L Final   Glucose, Bld 09/26/2023 104 (H)  70 - 99 mg/dL Final   Glucose reference range applies only to samples taken after fasting for at least 8 hours.   BUN 09/26/2023 19  6 - 20 mg/dL Final   Creatinine, Ser 09/26/2023 0.60 (L)  0.61 - 1.24 mg/dL Final   Calcium 10/29/7251 8.6 (L)  8.9 - 10.3 mg/dL Final   GFR, Estimated 09/26/2023 >60  >60 mL/min Final   Comment: (NOTE) Calculated using the CKD-EPI Creatinine Equation (2021)    Anion gap 09/26/2023 7  5 - 15 Final   Performed at [facility], [address] [place], [place] [address]   Phosphorus 09/26/2023 3.9  2.5 - 4.6 mg/dL Final   Performed at [facility], [address] [place], [place] [address]   Magnesium 09/26/2023 2.1  1.7 - 2.4 mg/dL Final   Performed at [facility], [address], [place], [place] [address]   WBC 09/27/2023 5.4  4.0 - 10.5 K/uL Final   RBC 09/27/2023 4.42  4.22 - 5.81 MIL/uL Final   Hemoglobin 09/27/2023 14.5  13.0 - 17.0 g/dL Final   HCT 87/56/4332 42.1  39.0 - 52.0 % Final   MCV 09/27/2023 95.2  80.0 - 100.0 fL Final   MCH 09/27/2023 32.8  26.0 - 34.0 pg Final   MCHC 09/27/2023 34.4  30.0 - 36.0 g/dL Final   RDW 95/18/8416 13.0   11.5 - 15.5 % Final   Platelets 09/27/2023 163  150 - 400 K/uL Final   nRBC 09/27/2023 0.0  0.0 - 0.2 % Final   Performed at [facility], [address], [place], [place] [address]   Sodium 09/27/2023 136  135 - 145 mmol/L Final   Potassium 09/27/2023 4.4  3.5 - 5.1 mmol/L Final   Chloride 09/27/2023 104  98 - 111 mmol/L Final   CO2 09/27/2023 25  22 - 32 mmol/L Final   Glucose, Bld 09/27/2023 96  70 - 99 mg/dL Final   Glucose reference range applies only to samples taken after fasting for at least 8 hours.   BUN 09/27/2023 11  6 - 20 mg/dL Final   Creatinine, Ser 09/27/2023 0.75  0.61 - 1.24 mg/dL Final   Calcium 16/10/930 9.3  8.9 - 10.3 mg/dL Final   GFR, Estimated 09/27/2023 >60  >  60 mL/min Final   Comment: (NOTE) Calculated using the CKD-EPI Creatinine Equation (2021)    Anion gap 09/27/2023 7  5 - 15 Final   Performed at [facility], [address], [place], [place] [address]   Phosphorus 09/27/2023 4.5  2.5 - 4.6 mg/dL Final   Performed at [facility], [address], [place], [place] [address]   Magnesium 09/27/2023 2.2  1.7 - 2.4 mg/dL Final   Performed at [facility], [address], [place], [place] [address]   Glucose-Capillary 09/26/2023 98  70 - 99 mg/dL Final   Glucose reference range applies only to samples taken after fasting for at least 8 hours.   WBC 09/28/2023 5.6  4.0 - 10.5 K/uL Final   RBC 09/28/2023 4.35  4.22 - 5.81 MIL/uL Final   Hemoglobin 09/28/2023 14.5  13.0 - 17.0 g/dL Final   HCT 86/57/8469 42.1  39.0 - 52.0 % Final   MCV 09/28/2023 96.8  80.0 - 100.0 fL Final   MCH 09/28/2023 33.3  26.0 - 34.0 pg Final   MCHC 09/28/2023 34.4  30.0 - 36.0 g/dL Final   RDW 62/95/2841 12.9  11.5 - 15.5 % Final   Platelets 09/28/2023 182  150 - 400 K/uL Final   nRBC 09/28/2023 0.0  0.0 - 0.2 % Final   Performed at [facility], [address] [place], [place] [address]   Sodium 09/28/2023 135  135 - 145 mmol/L Final   Potassium  09/28/2023 4.2  3.5 - 5.1 mmol/L Final   Chloride 09/28/2023 104  98 - 111 mmol/L Final   CO2 09/28/2023 24  22 - 32 mmol/L Final   Glucose, Bld 09/28/2023 102 (H)  70 - 99 mg/dL Final   Glucose reference range applies only to samples taken after fasting for at least 8 hours.   BUN 09/28/2023 16  6 - 20 mg/dL Final   Creatinine, Ser 09/28/2023 0.75  0.61 - 1.24 mg/dL Final   Calcium 08/23/2535 8.8 (L)  8.9 - 10.3 mg/dL Final   GFR, Estimated 09/28/2023 >60  >60 mL/min Final   Comment: (NOTE) Calculated using the CKD-EPI Creatinine Equation (2021)    Anion gap 09/28/2023 7  5 - 15 Final   Performed at [facility], [address] [place], [place] [address]   Phosphorus 09/28/2023 3.9  2.5 - 4.6 mg/dL Final   Performed at [facility], [address], [place], [place] [address]   Magnesium 09/28/2023 1.9  1.7 - 2.4 mg/dL Final   Performed at [facility], [address], [place], [place] [address]   Total Protein 09/28/2023 6.6  6.5 - 8.1 g/dL Final   Albumin 75/64/3329 3.7  3.5 - 5.0 g/dL Final   AST 51/88/4166 28  15 - 41 U/L Final   ALT 09/28/2023 38  0 - 44 U/L Final   Alkaline Phosphatase 09/28/2023 40  38 - 126 U/L Final   Total Bilirubin 09/28/2023 0.7  <1.2 mg/dL Final   Bilirubin, Direct 09/28/2023 0.1  0.0 - 0.2 mg/dL Final   Indirect Bilirubin 09/28/2023 0.6  0.3 - 0.9 mg/dL Final   Performed at [facility], [address] [place], [place] [address]   Lipase 09/28/2023 84 (H)  11 - 51 U/L Final   Performed at [facility], [address], [place], [place] [address]    Blood Alcohol level:  Lab Results  Component  Value Date   ETH 109 (H) 01/22/2024   ETH 436 (HH) 12/24/2023    Metabolic Disorder Labs: Lab Results  Component Value Date   HGBA1C 5.0 01/22/2024   MPG 96.8 01/22/2024   MPG 108 11/07/2014   No results found for: "PROLACTIN" Lab Results  Component Value Date   CHOL 202 (H) 01/22/2024   TRIG 65 01/22/2024   HDL 84  01/22/2024   CHOLHDL 2.4 01/22/2024   VLDL 13 01/22/2024   LDLCALC 105 (H) 01/22/2024   LDLCALC 137 (H) 01/09/2016    Therapeutic Lab Levels: No results found for: "LITHIUM" No results found for: "VALPROATE" No results found for: "CBMZ"  Physical Findings   AUDIT    Flowsheet Row ED from 01/23/2024 in [facility]  Alcohol Use Disorder Identification Test Final Score (AUDIT) 36      GAD-7    Flowsheet Row Counselor from 10/22/2023 in [facility]  Total GAD-7 Score 8      PHQ2-9    Flowsheet Row ED from 01/23/2024 in [facility] ED from 01/22/2024 in [facility] Counselor from 10/22/2023 in [facility] Office Visit from 08/22/2016 in [facility] Visit from 01/09/2016 in [facility]  PHQ-2 Total Score 3 4 0 0 0  PHQ-9 Total Score 14 12 -- -- --      Flowsheet Row ED from 01/23/2024 in [facility] ED from 01/22/2024 in [facility] ED from 12/24/2023 in [facility] Emergency Department at [facility]  C-SSRS RISK CATEGORY Low Risk Error: Q3, 4, or 5 should not be populated when Q2 is No No Risk        Musculoskeletal  Strength & Muscle Tone: within normal limits Gait & Station: normal Patient leans: N/A  Psychiatric Specialty Exam  Presentation  General Appearance:  Appropriate for Environment; Fairly Groomed  Eye Contact: Fair  Speech: Clear and Coherent; Normal Rate  Speech Volume: Normal  Handedness: Right   Mood and Affect  Mood: Euthymic  Affect: Congruent; Appropriate   Thought Process  Thought Processes: Coherent  Descriptions of Associations:Intact  Orientation:Full (Time, Place and Person)  Thought Content:Logical  Diagnosis of  Schizophrenia or Schizoaffective disorder in past: No    Hallucinations:Hallucinations: None  Ideas of Reference:None  Suicidal Thoughts:Suicidal Thoughts: No  Homicidal Thoughts:Homicidal Thoughts: No   Sensorium  Memory: Remote Good  Judgment: Fair  Insight: Fair   Art therapist  Concentration: Good  Attention Span: Good  Recall: Good  Fund of Knowledge: Good  Language: Good   Psychomotor Activity  Psychomotor Activity: Psychomotor Activity: Normal   Assets  Assets: Communication Skills; Resilience; Social Support   Sleep  Sleep: Sleep: Good   No data recorded  Physical Exam  Physical Exam Vitals reviewed.  Constitutional:      Appearance: Normal appearance.  HENT:     Head: Normocephalic and atraumatic.  Cardiovascular:     Rate and Rhythm: Normal rate.  Pulmonary:     Effort: Pulmonary effort is normal.  Neurological:     General: No focal deficit present.     Mental Status: He is alert and oriented to person, place, and time.    Review of Systems  Constitutional:  Negative for chills and fever.  Respiratory:  Negative for shortness of breath.   Cardiovascular:  Negative for  chest pain and palpitations.  Gastrointestinal:  Negative for nausea and vomiting.  Neurological:  Positive for tremors. Negative for headaches.   Blood pressure 119/75, pulse 72, temperature 97.8 F (36.6 C), temperature source Oral, resp. rate 16, SpO2 99%. There is no height or weight on file to calculate BMI.  Treatment Plan Summary: Daily contact with patient to assess and evaluate symptoms and progress in treatment and Medication management   [name]. Nowland is a 43 yr old male who presented on 3/28 to [facility] requesting help for EtOH Abuse, he was admitted to [facility] on 3/29.  PPHx is significant for Depression, Anxiety, EtOH Abuse, and has been to [facility] times, and no history of Suicide Attempts, Self Injurious Behavior, or Psychiatric  Hospitalizations.  PMHx is significant for Withdrawal Seizures.     MDD, Recurrent, Moderate  GAD: -Continue Zoloft 50 mg daily for depression and anxiety. -Continue Agitation Protocol: Zyprexa     Withdrawal: -Continue CIWA, last score= 0 -completed Ativan taper 4/1 -Continue Ativan 1 mg q6 PRN CIWA>10 -Continue Imodium 2-4 mg PRN diarrhea -Continue Zofran-ODT 4 mg q6 PRN nausea -Continue Thiamine 100 mg daily for nutritional supplementation -Continue Multivitamin daily for nutritional supplementation       -Continue PRN's: Tylenol, Maalox, Atarax, Milk of Magnesia, Trazodone     Dispo: [facility] 4/8  Lance Muss, MD 01/28/2024 11:10 AM

## 2024-01-28 NOTE — ED Notes (Signed)
 Pt is in the dayroom watching TV with peers. Pt denies SI/HI/AVH. No acute distress noted. Will continue to monitor for safety.

## 2024-01-28 NOTE — ED Notes (Signed)
 Downtime was from 1am-3am. MHT charted rounds on paper and will put it in their folders.

## 2024-01-28 NOTE — ED Notes (Signed)
 Patient is resting in bed without and distress or discomfort noted or withdrawal symptoms. Staff will continue to monitor safety per protocol and for changes in his condition.

## 2024-01-28 NOTE — Group Note (Signed)
 Group Topic: Feelings about Diagnosis  Group Date: 01/28/2024 Start Time: 2000 End Time: 2152 Facilitators: Rae Lips B  Department: [facility]  Number of Participants: 5  Group Focus: abuse issues, acceptance, activities of daily living skills, anxiety, art therapy, check in, communication, coping skills, family, feeling awareness/expression, forgiveness, goals/reality orientation, healthy friendships, personal responsibility, problem solving, reminiscence, safety plan, self-awareness, self-esteem, social skills, and substance abuse education Treatment Modality:  Exposure Therapy, Leisure Counsellor, Optician, dispensing, and Spiritual Interventions utilized were leisure development, patient education, problem solving, reality testing, reminiscence, story telling, and support Purpose: enhance coping skills, express feelings, express irrational fears, improve communication skills, increase insight, regain self-worth, reinforce self-care, relapse prevention strategies, and trigger / craving management  Name: Trevor [name] Date of Birth: [birth year]  MR: [id number]    Level of Participation: active Quality of Participation: attentive, cooperative, initiates communication, motivated, and offered feedback Interactions with others: gave feedback Mood/Affect: appropriate Triggers (if applicable): NA Cognition: coherent/clear Progress: None Response: NA Plan: patient will be encouraged to go to groups.   Patients Problems:  Patient Active Problem List   Diagnosis Date Noted   Alcohol use disorder 01/23/2024   Hyponatremia 06/19/2022   Transaminitis 06/18/2022   GERD (gastroesophageal reflux disease) 06/18/2022   Hypokalemia 06/18/2022   Loss of consciousness (HCC) 06/18/2022   QT prolongation 06/18/2022   Chronic hepatitis C without hepatic coma (HCC) 06/28/2019   Alcohol abuse 06/28/2019   Psoriasis 06/07/2019   Suicidal ideation 06/07/2019   Withdrawal  symptoms, alcohol, with delirium (HCC) 06/07/2019   Alcoholic hepatitis without ascites 06/05/2019   Alcohol withdrawal (HCC) 06/03/2019   Obesity (BMI 30-39.9) 01/09/2016   HSV-2 (herpes simplex virus 2) infection

## 2024-01-29 DIAGNOSIS — F411 Generalized anxiety disorder: Secondary | ICD-10-CM | POA: Diagnosis not present

## 2024-01-29 DIAGNOSIS — F1020 Alcohol dependence, uncomplicated: Secondary | ICD-10-CM | POA: Diagnosis not present

## 2024-01-29 DIAGNOSIS — A6002 Herpesviral infection of other male genital organs: Secondary | ICD-10-CM | POA: Diagnosis not present

## 2024-01-29 DIAGNOSIS — F331 Major depressive disorder, recurrent, moderate: Secondary | ICD-10-CM | POA: Diagnosis not present

## 2024-01-29 MED ORDER — VALACYCLOVIR HCL 500 MG PO TABS
1000.0000 mg | ORAL_TABLET | Freq: Two times a day (BID) | ORAL | Status: DC
  Administered 2024-01-29 – 2024-02-01 (×8): 1000 mg via ORAL
  Filled 2024-01-29 (×8): qty 2
  Filled 2024-01-29: qty 28
  Filled 2024-01-29 (×2): qty 2

## 2024-01-29 NOTE — ED Notes (Signed)
 Pt has been observable on unit.  Calm, cooperative and pleasant. Pt is actively engaged in his treatment.  Interacts appropriately with peers.  Bright affect noted.  Safety maintained.  A 15 minute observations for safety continue

## 2024-01-29 NOTE — ED Notes (Signed)
 Patient is sleeping. Respirations equal and unlabored, skin warm and dry. No change in assessment or acuity. Routine safety checks conducted according to facility protocol. Will continue to monitor for safety.

## 2024-01-29 NOTE — ED Notes (Signed)
 Pt presents sitting in dayroom.  Calm upon approach.  Observed interacting with staff and peers appropriately.  No s/s of W/D noted or reported. Denied current SI plan and intent.  Denied HI and A/V hallucinations

## 2024-01-29 NOTE — ED Notes (Signed)
 Pt pulse rechecked by nurse, 62.

## 2024-01-29 NOTE — ED Provider Notes (Signed)
 Behavioral Health Progress Note  Date and Time: 01/29/2024 8:19 AM Name: Trevor [name] MRN:  [id number]  Subjective:  Trevor [name] is a 43 yr old male who presented on 3/28 to [facility] requesting help for EtOH Abuse, he was admitted to [facility] on 3/29 for alcohol detox and rehab services. PPHx is significant for Depression, Anxiety, EtOH Abuse, and has been to Residential Rehab 4 times, and no history of Suicide Attempts, Self Injurious Behavior, or Psychiatric Hospitalizations. PMHx is significant for Withdrawal Seizures.   Patient seen in his room, no acute distress. Patient reports feeling "better-a little anxious" today. Patient reports good sleep, stating he woke up with some sweating but did sleep through the night. He reports good appetite. Regarding withdrawal symptoms, he reports some tremors which are slowly improving. He denies adverse effects on Zoloft. Patient denies current SI, HI, and AVH. Regarding discharge plans, he plans completing the 21 day program in [facility] and then pursuing the long term care. He feels worried about the rash he is developing on the left side of his chest. 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 I discussed that we will treat the skin lesions appropriately.   I consulted ID physician Dr. Rutha Bouchard regarding patient dermatomal lesion and they recommended starting patient on valcyclovir 100 mg BID for 10 days.  Diagnosis:  Final diagnoses:  Alcohol abuse  Moderate episode of recurrent major depressive disorder (HCC)  GAD (generalized anxiety disorder)    Total Time spent with patient: 20 minutes  Past Psychiatric History: Depression, Anxiety, EtOH Abuse, and has been to Residential Rehab 4 times, and no history of Suicide Attempts, Self Injurious Behavior, or Psychiatric Hospitalizations.    Past Medical History:  Depression, Anxiety, EtOH Abuse, and has been to Residential Rehab 4 times, and  no history of Suicide Attempts, Self Injurious Behavior, or Psychiatric Hospitalizations.    Family History:  Mother- Skin Cancer Paternal Grandfather- Colon Cancer Paternal Grandmother and Grandfather- Lung Cancer   Family Psychiatric  History:  Brother- EtOH Abuse Maternal Grandfather and Grandmother- EtOH Abuse No Known Diagnosis' or Suicides   Social History:  Divorced, has a daughter. Recently fired from his job. Started drinking at age 26, longest period of sobriety 6 months, been to Residential Rehab 4 times.    Current Medications:  Current Facility-Administered Medications  Medication Dose Route Frequency Provider Last Rate Last Admin   acetaminophen (TYLENOL) tablet 650 mg  650 mg Oral Q6H PRN Marlou Sa, NP       alum & mag hydroxide-simeth (MAALOX/MYLANTA) 200-200-20 MG/5ML suspension 30 mL  30 mL Oral Q4H PRN Rayburn Go, Veronique M, NP       gabapentin (NEURONTIN) capsule 200 mg  200 mg Oral TID Miguel Rota, MD   200 mg at 01/28/24 2111   hydrOXYzine (ATARAX) tablet 25 mg  25 mg Oral Q6H PRN Miguel Rota, MD   25 mg at 01/28/24 1539   magnesium hydroxide (MILK OF MAGNESIA) suspension 30 mL  30 mL Oral Daily PRN Marlou Sa, NP   30 mL at 01/25/24 1309   multivitamin with minerals tablet 1 tablet  1 tablet Oral Daily Marlou Sa, NP   1 tablet at 01/28/24 0914   OLANZapine (ZYPREXA) injection 10 mg  10 mg Intramuscular TID PRN Marlou Sa, NP       OLANZapine (ZYPREXA) injection 5 mg  5 mg Intramuscular  TID PRN Marlou Sa, NP       OLANZapine zydis (ZYPREXA) disintegrating tablet 5 mg  5 mg Oral TID PRN Rayburn Go, Veronique M, NP       sertraline (ZOLOFT) tablet 50 mg  50 mg Oral Daily Lauro Franklin, MD   50 mg at 01/28/24 1308   thiamine (VITAMIN B1) tablet 100 mg  100 mg Oral Daily Olin Pia M, NP   100 mg at 01/28/24 0914   traZODone (DESYREL) tablet 50 mg  50 mg Oral QHS PRN Marlou Sa, NP    50 mg at 01/28/24 2111   Current Outpatient Medications  Medication Sig Dispense Refill   famotidine (PEPCID) 20 MG tablet Take 20 mg by mouth 2 (two) times daily. (Patient not taking: Reported on 01/22/2024)     hydrOXYzine (ATARAX) 50 MG tablet Take 50 mg by mouth every 8 (eight) hours as needed for anxiety. (Patient not taking: Reported on 01/22/2024)     Multiple Vitamin (MULTIVITAMIN WITH MINERALS) TABS tablet Take 1 tablet by mouth daily.     thiamine (VITAMIN B1) 100 MG tablet Take 1 tablet (100 mg total) by mouth daily. (Patient not taking: Reported on 01/22/2024) 30 tablet 0   traZODone (DESYREL) 150 MG tablet Take 150 mg by mouth at bedtime. (Patient not taking: Reported on 01/22/2024)      Labs  Lab Results:  Admission on 01/22/2024, Discharged on 01/23/2024  Component Date Value Ref Range Status   WBC 01/22/2024 6.8  4.0 - 10.5 K/uL Final   RBC 01/22/2024 5.22  4.22 - 5.81 MIL/uL Final   Hemoglobin 01/22/2024 17.2 (H)  13.0 - 17.0 g/dL Final   HCT 65/78/4696 47.8  39.0 - 52.0 % Final   MCV 01/22/2024 91.6  80.0 - 100.0 fL Final   MCH 01/22/2024 33.0  26.0 - 34.0 pg Final   MCHC 01/22/2024 36.0  30.0 - 36.0 g/dL Final   RDW 29/52/8413 12.1  11.5 - 15.5 % Final   Platelets 01/22/2024 319  150 - 400 K/uL Final   nRBC 01/22/2024 0.0  0.0 - 0.2 % Final   Neutrophils Relative % 01/22/2024 70  % Final   Neutro Abs 01/22/2024 4.8  1.7 - 7.7 K/uL Final   Lymphocytes Relative 01/22/2024 25  % Final   Lymphs Abs 01/22/2024 1.7  0.7 - 4.0 K/uL Final   Monocytes Relative 01/22/2024 4  % Final   Monocytes Absolute 01/22/2024 0.3  0.1 - 1.0 K/uL Final   Eosinophils Relative 01/22/2024 0  % Final   Eosinophils Absolute 01/22/2024 0.0  0.0 - 0.5 K/uL Final   Basophils Relative 01/22/2024 1  % Final   Basophils Absolute 01/22/2024 0.1  0.0 - 0.1 K/uL Final   Immature Granulocytes 01/22/2024 0  % Final   Abs Immature Granulocytes 01/22/2024 0.02  0.00 - 0.07 K/uL Final   Performed at [facility], [address] [place], [place] [address]   Sodium 01/22/2024 138  135 - 145 mmol/L Final   Potassium 01/22/2024 3.9  3.5 - 5.1 mmol/L Final   Chloride 01/22/2024 99  98 - 111 mmol/L Final   CO2 01/22/2024 25  22 - 32 mmol/L Final   Glucose, Bld 01/22/2024 99  70 - 99 mg/dL Final   Glucose reference range applies only to samples taken after fasting for at least 8 hours.   BUN 01/22/2024 13  6 - 20 mg/dL Final   Creatinine, Ser 01/22/2024 0.83  0.61 -  1.24 mg/dL Final   Calcium 45/40/9811 8.9  8.9 - 10.3 mg/dL Final   Total Protein 91/47/8295 7.5  6.5 - 8.1 g/dL Final   Albumin 62/13/0865 4.6  3.5 - 5.0 g/dL Final   AST 78/46/9629 44 (H)  15 - 41 U/L Final   ALT 01/22/2024 46 (H)  0 - 44 U/L Final   Alkaline Phosphatase 01/22/2024 60  38 - 126 U/L Final   Total Bilirubin 01/22/2024 0.8  0.0 - 1.2 mg/dL Final   GFR, Estimated 01/22/2024 >60  >60 mL/min Final   Comment: (NOTE) Calculated using the CKD-EPI Creatinine Equation (2021)    Anion gap 01/22/2024 14  5 - 15 Final   Performed at [facility], [address], [place], [place] [address]   Hgb A1c MFr Bld 01/22/2024 5.0  4.8 - 5.6 % Final   Comment: (NOTE) Pre diabetes:          5.7%-6.4%  Diabetes:              >6.4%  Glycemic control for   <7.0% adults with diabetes    Mean Plasma Glucose 01/22/2024 96.8  mg/dL Final   Performed at [facility], [address] [place], [place] [address]   Magnesium 01/22/2024 1.9  1.7 - 2.4 mg/dL Final   Performed at [facility], [address] [place], [place] [address]   Alcohol, Ethyl (B) 01/22/2024 109 (H)  <10 mg/dL Final   Comment: (NOTE) Lowest detectable limit for serum alcohol is 10 mg/dL.  For medical purposes only. Performed at [facility], [address], [place], [place] [address]    Cholesterol 01/22/2024 202 (H)  0 - 200 mg/dL Final   Triglycerides 40/34/7425 65  <150 mg/dL Final   HDL 95/63/8756 84  >40 mg/dL Final   Total  CHOL/HDL Ratio 01/22/2024 2.4  RATIO Final   VLDL 01/22/2024 13  0 - 40 mg/dL Final   LDL Cholesterol 01/22/2024 105 (H)  0 - 99 mg/dL Final   Comment:        Total Cholesterol/HDL:CHD Risk Coronary Heart Disease Risk Table                     Men   Women  1/2 Average Risk   3.4   3.3  Average Risk       5.0   4.4  2 X Average Risk   9.6   7.1  3 X Average Risk  23.4   11.0        Use the calculated Patient Ratio above and the CHD Risk Table to determine the patient's CHD Risk.        ATP III CLASSIFICATION (LDL):  <100     mg/dL   Optimal  433-295  mg/dL   Near or Above                    Optimal  130-159  mg/dL   Borderline  188-416  mg/dL   High  >606     mg/dL   Very High Performed at [facility], [address] [place], [place] [address]    TSH 01/22/2024 0.458  0.350 - 4.500 uIU/mL Final   Comment: Performed by a 3rd Generation assay with a functional sensitivity of <=0.01 uIU/mL. Performed at [facility], [address], [place], [place] [address]    RPR Ser Ql 01/22/2024 NON REACTIVE  NON  REACTIVE Final   Performed at [facility], [address] [place], [place] [address]   Color, Urine 01/22/2024 YELLOW  YELLOW Final   APPearance 01/22/2024 CLEAR  CLEAR Final   Specific Gravity, Urine 01/22/2024 1.020  1.005 - 1.030 Final   pH 01/22/2024 5.0  5.0 - 8.0 Final   Glucose, UA 01/22/2024 NEGATIVE  NEGATIVE mg/dL Final   Hgb urine dipstick 01/22/2024 NEGATIVE  NEGATIVE Final   Bilirubin Urine 01/22/2024 NEGATIVE  NEGATIVE Final   Ketones, ur 01/22/2024 5 (A)  NEGATIVE mg/dL Final   Protein, ur 98/08/9146 NEGATIVE  NEGATIVE mg/dL Final   Nitrite 82/95/6213 NEGATIVE  NEGATIVE Final   Leukocytes,Ua 01/22/2024 NEGATIVE  NEGATIVE Final   Performed at [facility], [address], [place], [place] [address]   POC Amphetamine UR 01/22/2024 None Detected   Final   POC Secobarbital (BAR) 01/22/2024 None Detected   Final   POC Buprenorphine (BUP)  01/22/2024 None Detected   Final   POC Oxazepam (BZO) 01/22/2024 None Detected   Final   POC Cocaine UR 01/22/2024 None Detected   Final   POC Methamphetamine UR 01/22/2024 None Detected   Final   POC Morphine 01/22/2024 None Detected   Final   POC Methadone UR 01/22/2024 None Detected   Final   POC Oxycodone UR 01/22/2024 None Detected   Final   POC Marijuana UR 01/22/2024 None Detected   Final  Admission on 12/24/2023, Discharged on 12/25/2023  Component Date Value Ref Range Status   Sodium 12/24/2023 144  135 - 145 mmol/L Final   Potassium 12/24/2023 3.8  3.5 - 5.1 mmol/L Final   Chloride 12/24/2023 107  98 - 111 mmol/L Final   CO2 12/24/2023 26  22 - 32 mmol/L Final   Glucose, Bld 12/24/2023 96  70 - 99 mg/dL Final   Glucose reference range applies only to samples taken after fasting for at least 8 hours.   BUN 12/24/2023 11  6 - 20 mg/dL Final   Creatinine, Ser 12/24/2023 0.62  0.61 - 1.24 mg/dL Final   Calcium 84/69/6295 8.6 (L)  8.9 - 10.3 mg/dL Final   Total Protein 28/41/3244 7.8  6.5 - 8.1 g/dL Final   Albumin 10/29/7251 4.3  3.5 - 5.0 g/dL Final   AST 66/44/0347 34  15 - 41 U/L Final   ALT 12/24/2023 27  0 - 44 U/L Final   Alkaline Phosphatase 12/24/2023 56  38 - 126 U/L Final   Total Bilirubin 12/24/2023 0.4  0.0 - 1.2 mg/dL Final   GFR, Estimated 12/24/2023 >60  >60 mL/min Final   Comment: (NOTE) Calculated using the CKD-EPI Creatinine Equation (2021)    Anion gap 12/24/2023 11  5 - 15 Final   Performed at [facility], [address] [place], [place] [address]   Alcohol, Ethyl (B) 12/24/2023 436 (HH)  <10 mg/dL Final   Comment: CRITICAL RESULT CALLED TO, READ BACK BY AND VERIFIED WITH Barnett Applebaum, RN 12/25/23 0050 BY K. DAVIS (NOTE) Lowest detectable limit for serum alcohol is 10 mg/dL.  For medical purposes only. Performed at [facility], [address], [place], [place] [address]    Opiates 12/25/2023 NONE DETECTED  NONE  DETECTED Final   Cocaine 12/25/2023 NONE DETECTED  NONE DETECTED Final   Benzodiazepines 12/25/2023 NONE DETECTED  NONE DETECTED Final   Amphetamines 12/25/2023 NONE DETECTED  NONE DETECTED Final   Tetrahydrocannabinol 12/25/2023 NONE DETECTED  NONE DETECTED Final  Barbiturates 12/25/2023 NONE DETECTED  NONE DETECTED Final   Comment: (NOTE) DRUG SCREEN FOR MEDICAL PURPOSES ONLY.  IF CONFIRMATION IS NEEDED FOR ANY PURPOSE, NOTIFY LAB WITHIN 5 DAYS.  LOWEST DETECTABLE LIMITS FOR URINE DRUG SCREEN Drug Class                     Cutoff (ng/mL) Amphetamine and metabolites    1000 Barbiturate and metabolites    200 Benzodiazepine                 200 Opiates and metabolites        300 Cocaine and metabolites        300 THC                            50 Performed at [facility], [address] [place], [place] [address]    WBC 12/24/2023 5.1  4.0 - 10.5 K/uL Final   RBC 12/24/2023 4.78  4.22 - 5.81 MIL/uL Final   Hemoglobin 12/24/2023 16.0  13.0 - 17.0 g/dL Final   HCT 60/45/4098 45.7  39.0 - 52.0 % Final   MCV 12/24/2023 95.6  80.0 - 100.0 fL Final   MCH 12/24/2023 33.5  26.0 - 34.0 pg Final   MCHC 12/24/2023 35.0  30.0 - 36.0 g/dL Final   RDW 11/91/4782 12.8  11.5 - 15.5 % Final   Platelets 12/24/2023 292  150 - 400 K/uL Final   nRBC 12/24/2023 0.0  0.0 - 0.2 % Final   Neutrophils Relative % 12/24/2023 61  % Final   Neutro Abs 12/24/2023 3.0  1.7 - 7.7 K/uL Final   Lymphocytes Relative 12/24/2023 31  % Final   Lymphs Abs 12/24/2023 1.6  0.7 - 4.0 K/uL Final   Monocytes Relative 12/24/2023 8  % Final   Monocytes Absolute 12/24/2023 0.4  0.1 - 1.0 K/uL Final   Eosinophils Relative 12/24/2023 0  % Final   Eosinophils Absolute 12/24/2023 0.0  0.0 - 0.5 K/uL Final   Basophils Relative 12/24/2023 0  % Final   Basophils Absolute 12/24/2023 0.0  0.0 - 0.1 K/uL Final   Immature Granulocytes 12/24/2023 0  % Final   Abs Immature Granulocytes 12/24/2023 0.02  0.00 -  0.07 K/uL Final   Performed at [facility], [address] [place], [place] [address]   Lipase 12/24/2023 41  11 - 51 U/L Final   Performed at [facility], [address], [place], [place] [address]   Color, Urine 12/25/2023 YELLOW  YELLOW Final   APPearance 12/25/2023 HAZY (A)  CLEAR Final   Specific Gravity, Urine 12/25/2023 1.010  1.005 - 1.030 Final   pH 12/25/2023 7.0  5.0 - 8.0 Final   Glucose, UA 12/25/2023 NEGATIVE  NEGATIVE mg/dL Final   Hgb urine dipstick 12/25/2023 NEGATIVE  NEGATIVE Final   Bilirubin Urine 12/25/2023 NEGATIVE  NEGATIVE Final   Ketones, ur 12/25/2023 NEGATIVE  NEGATIVE mg/dL Final   Protein, ur 78/46/9629 NEGATIVE  NEGATIVE mg/dL Final   Nitrite 52/84/1324 NEGATIVE  NEGATIVE Final   Leukocytes,Ua 12/25/2023 NEGATIVE  NEGATIVE Final   Performed at [facility], [address] [place], [place] [address]  Admission on 09/23/2023, Discharged on 09/29/2023  Component Date Value Ref Range Status   WBC 09/23/2023 5.2  4.0 - 10.5 K/uL Final   RBC 09/23/2023 5.10  4.22 - 5.81 MIL/uL Final   Hemoglobin 09/23/2023  16.9  13.0 - 17.0 g/dL Final   HCT 40/98/1191 46.5  39.0 - 52.0 % Final   MCV 09/23/2023 91.2  80.0 - 100.0 fL Final   MCH 09/23/2023 33.1  26.0 - 34.0 pg Final   MCHC 09/23/2023 36.3 (H)  30.0 - 36.0 g/dL Final   RDW 47/82/9562 12.9  11.5 - 15.5 % Final   Platelets 09/23/2023 185  150 - 400 K/uL Final   nRBC 09/23/2023 0.0  0.0 - 0.2 % Final   Performed at [facility], [address] [place], [place] [address]   Sodium 09/23/2023 139  135 - 145 mmol/L Final   Potassium 09/23/2023 3.5  3.5 - 5.1 mmol/L Final   Chloride 09/23/2023 99  98 - 111 mmol/L Final   CO2 09/23/2023 22  22 - 32 mmol/L Final   Glucose, Bld 09/23/2023 169 (H)  70 - 99 mg/dL Final   Glucose reference range applies only to samples taken after fasting for at least 8 hours.   BUN 09/23/2023 6  6 - 20 mg/dL Final    Creatinine, Ser 09/23/2023 0.71  0.61 - 1.24 mg/dL Final   Calcium 57/84/6962 8.4 (L)  8.9 - 10.3 mg/dL Final   Total Protein 95/28/4132 6.9  6.5 - 8.1 g/dL Final   Albumin 44/10/270 4.0  3.5 - 5.0 g/dL Final   AST 53/66/4403 135 (H)  15 - 41 U/L Final   ALT 09/23/2023 76 (H)  0 - 44 U/L Final   Alkaline Phosphatase 09/23/2023 58  38 - 126 U/L Final   Total Bilirubin 09/23/2023 0.9  <1.2 mg/dL Final   GFR, Estimated 09/23/2023 >60  >60 mL/min Final   Comment: (NOTE) Calculated using the CKD-EPI Creatinine Equation (2021)    Anion gap 09/23/2023 18 (H)  5 - 15 Final   Performed at [facility], [address] [place], [place] [address]   Troponin I (High Sensitivity) 09/23/2023 14  <18 ng/L Final   Comment: (NOTE) Elevated high sensitivity troponin I (hsTnI) values and significant  changes across serial measurements may suggest ACS but many other  chronic and acute conditions are known to elevate hsTnI results.  Refer to the "Links" section for chest pain algorithms and additional  guidance. Performed at [facility], [address], [place], [place] [address]    Alcohol, Ethyl (B) 09/23/2023 412 (HH)  <10 mg/dL Final   Comment: CRITICAL RESULT CALLED TO, READ BACK BY AND VERIFIED WITH B. OSORIO, RN AT [id number] 11.27.24 JLASIGAN (NOTE) Lowest detectable limit for serum alcohol is 10 mg/dL.  For medical purposes only. Performed at [facility], [address], [place], [place] [address]    Lipase 09/23/2023 86 (H)  11 - 51 U/L Final   Performed at [facility], [address] [place], [place] [address]   Troponin I (High Sensitivity) 09/23/2023 19 (H)  <18 ng/L Final   Comment: (NOTE) Elevated high sensitivity troponin I (hsTnI) values and significant  changes across serial measurements may suggest ACS but many other  chronic and acute conditions are known to elevate hsTnI results.  Refer to the "Links" section for chest pain algorithms and additional   guidance. Performed at [facility], [address], [place], [place] [address]    Magnesium 09/23/2023 1.8  1.7 - 2.4 mg/dL Final   Performed at [facility], [address] [place], [place] [address]   Phosphorus 09/23/2023 2.2 (L)  2.5 - 4.6 mg/dL Final   Performed at [facility], [address] [place], [place] [address]   Opiates 09/23/2023 NONE DETECTED  NONE DETECTED Final   Cocaine 09/23/2023 NONE DETECTED  NONE DETECTED Final   Benzodiazepines 09/23/2023 NONE DETECTED  NONE DETECTED Final   Amphetamines 09/23/2023 NONE DETECTED  NONE DETECTED Final   Tetrahydrocannabinol 09/23/2023 NONE DETECTED  NONE DETECTED Final   Barbiturates 09/23/2023 NONE DETECTED  NONE DETECTED Final   Comment: (NOTE) DRUG SCREEN FOR MEDICAL PURPOSES ONLY.  IF CONFIRMATION IS NEEDED FOR ANY PURPOSE, NOTIFY LAB WITHIN 5 DAYS.  LOWEST DETECTABLE LIMITS FOR URINE DRUG SCREEN Drug Class                     Cutoff (ng/mL) Amphetamine and metabolites    1000 Barbiturate and metabolites    200 Benzodiazepine                 200 Opiates and metabolites        300 Cocaine and metabolites        300 THC                            50 Performed at [facility], [address] [place], [place] [address]    HIV Screen 4th Generation wRfx 09/23/2023 Non Reactive  Non Reactive Final   Performed at [facility], [address] [place], [place] [address]   Phosphorus 09/24/2023 3.1  2.5 - 4.6 mg/dL Final   Performed at [facility], [address], [place], [place] [address]   Lipase 09/24/2023 92 (H)  11 - 51 U/L Final   Performed at [facility], [address], [place], [place] [address]   Sodium 09/24/2023 130 (L)  135 - 145 mmol/L Final   DELTA CHECK NOTED   Potassium 09/24/2023 3.8  3.5 - 5.1 mmol/L Final   Chloride 09/24/2023 98  98 - 111 mmol/L Final   CO2 09/24/2023 24  22 - 32 mmol/L Final   Glucose, Bld 09/24/2023 94  70 - 99 mg/dL Final   Glucose  reference range applies only to samples taken after fasting for at least 8 hours.   BUN 09/24/2023 9  6 - 20 mg/dL Final   Creatinine, Ser 09/24/2023 0.63  0.61 - 1.24 mg/dL Final   Calcium 13/05/6577 8.5 (L)  8.9 - 10.3 mg/dL Final   Total Protein 46/96/2952 5.6 (L)  6.5 - 8.1 g/dL Final   Albumin 84/13/2440 3.2 (L)  3.5 - 5.0 g/dL Final   AST 08/23/2535 78 (H)  15 - 41 U/L Final   ALT 09/24/2023 54 (H)  0 - 44 U/L Final   Alkaline Phosphatase 09/24/2023 42  38 - 126 U/L Final   Total Bilirubin 09/24/2023 1.2 (H)  <1.2 mg/dL Final   GFR, Estimated 09/24/2023 >60  >60 mL/min Final   Comment: (NOTE) Calculated using the CKD-EPI Creatinine Equation (2021)    Anion gap 09/24/2023 8  5 - 15 Final   Performed at [facility], [address], [place], [place] [address]   WBC 09/24/2023 4.1  4.0 - 10.5 K/uL Final   RBC 09/24/2023 3.97 (L)  4.22 - 5.81 MIL/uL Final   Hemoglobin 09/24/2023 13.2  13.0 - 17.0 g/dL Final   HCT 47/42/5956 37.2 (L)  39.0 - 52.0 % Final   MCV 09/24/2023 93.7  80.0 - 100.0 fL Final   MCH 09/24/2023 33.2  26.0 - 34.0 pg Final   MCHC 09/24/2023 35.5  30.0 - 36.0 g/dL Final   RDW 16/07/9603 12.8  11.5 - 15.5 % Final   Platelets 09/24/2023 130 (L)  150 - 400 K/uL Final   nRBC 09/24/2023 0.0  0.0 - 0.2 % Final   Neutrophils Relative % 09/24/2023 60  % Final   Neutro Abs 09/24/2023 2.5  1.7 - 7.7 K/uL Final   Lymphocytes Relative 09/24/2023 27  % Final   Lymphs Abs 09/24/2023 1.1  0.7 - 4.0 K/uL Final   Monocytes Relative 09/24/2023 11  % Final   Monocytes Absolute 09/24/2023 0.4  0.1 - 1.0 K/uL Final   Eosinophils Relative 09/24/2023 1  % Final   Eosinophils Absolute 09/24/2023 0.0  0.0 - 0.5 K/uL Final   Basophils Relative 09/24/2023 1  % Final   Basophils Absolute 09/24/2023 0.0  0.0 - 0.1 K/uL Final   Immature Granulocytes 09/24/2023 0  % Final   Abs Immature Granulocytes 09/24/2023 0.01  0.00 - 0.07 K/uL Final   Performed at [facility], [address] [place], [place] [address]   Magnesium 09/24/2023 1.6 (L)  1.7 - 2.4 mg/dL Final   Performed at [facility], [address], [place], [place] [address]   WBC 09/25/2023 5.1  4.0 - 10.5 K/uL Final   RBC 09/25/2023 4.20 (L)  4.22 - 5.81 MIL/uL Final   Hemoglobin 09/25/2023 13.5  13.0 - 17.0 g/dL Final   HCT 78/29/5621 40.0  39.0 - 52.0 % Final   MCV 09/25/2023 95.2  80.0 - 100.0 fL Final   MCH 09/25/2023 32.1  26.0 - 34.0 pg Final   MCHC 09/25/2023 33.8  30.0 - 36.0 g/dL Final   RDW 30/86/5784 13.1  11.5 - 15.5 % Final   Platelets 09/25/2023 138 (L)  150 - 400 K/uL Final   REPEATED TO VERIFY   nRBC 09/25/2023 0.0  0.0 - 0.2 % Final   Performed at [facility], [address], [place], [place] [address]   Sodium 09/25/2023 134 (L)  135 - 145 mmol/L Final   Potassium 09/25/2023 3.9  3.5 - 5.1 mmol/L Final   Chloride 09/25/2023 102  98 - 111 mmol/L Final   CO2 09/25/2023 22  22 - 32 mmol/L Final   Glucose, Bld 09/25/2023 92  70 - 99 mg/dL Final   Glucose reference range applies only to samples taken after fasting for at least 8 hours.   BUN 09/25/2023 12  6 - 20 mg/dL Final   Creatinine, Ser 09/25/2023 0.61  0.61 - 1.24 mg/dL Final   Calcium 52/84/1324 9.0  8.9 - 10.3 mg/dL Final   GFR, Estimated 09/25/2023 >60  >60 mL/min Final   Comment: (NOTE) Calculated using the CKD-EPI Creatinine Equation (2021)    Anion gap 09/25/2023 10  5 - 15 Final   Performed at [facility], [address], [place], [place] [address]   Phosphorus 09/25/2023 5.5 (H)  2.5 - 4.6 mg/dL Final   Performed at [facility], [address], [place], [place] [address]   Magnesium 09/25/2023 2.2  1.7 - 2.4 mg/dL Final   Performed at [facility], [address], [place], [place] [address]   Glucose-Capillary 09/25/2023 88  70 - 99 mg/dL Final   Glucose reference range applies only to samples taken after fasting for at least 8  hours.   Glucose-Capillary 09/25/2023 101 (H)  70 - 99  mg/dL Final   Glucose reference range applies only to samples taken after fasting for at least 8 hours.   Glucose-Capillary 09/25/2023 109 (H)  70 - 99 mg/dL Final   Glucose reference range applies only to samples taken after fasting for at least 8 hours.   WBC 09/26/2023 5.2  4.0 - 10.5 K/uL Final   RBC 09/26/2023 4.26  4.22 - 5.81 MIL/uL Final   Hemoglobin 09/26/2023 14.0  13.0 - 17.0 g/dL Final   HCT 16/07/9603 40.3  39.0 - 52.0 % Final   MCV 09/26/2023 94.6  80.0 - 100.0 fL Final   MCH 09/26/2023 32.9  26.0 - 34.0 pg Final   MCHC 09/26/2023 34.7  30.0 - 36.0 g/dL Final   RDW 54/06/8118 13.1  11.5 - 15.5 % Final   Platelets 09/26/2023 143 (L)  150 - 400 K/uL Final   nRBC 09/26/2023 0.0  0.0 - 0.2 % Final   Performed at [facility], [address], [place], [place] [address]   Sodium 09/26/2023 134 (L)  135 - 145 mmol/L Final   Potassium 09/26/2023 4.4  3.5 - 5.1 mmol/L Final   Chloride 09/26/2023 106  98 - 111 mmol/L Final   CO2 09/26/2023 21 (L)  22 - 32 mmol/L Final   Glucose, Bld 09/26/2023 104 (H)  70 - 99 mg/dL Final   Glucose reference range applies only to samples taken after fasting for at least 8 hours.   BUN 09/26/2023 19  6 - 20 mg/dL Final   Creatinine, Ser 09/26/2023 0.60 (L)  0.61 - 1.24 mg/dL Final   Calcium 95/62/1308 8.6 (L)  8.9 - 10.3 mg/dL Final   GFR, Estimated 09/26/2023 >60  >60 mL/min Final   Comment: (NOTE) Calculated using the CKD-EPI Creatinine Equation (2021)    Anion gap 09/26/2023 7  5 - 15 Final   Performed at [facility], [address], [place], [place] [address]   Phosphorus 09/26/2023 3.9  2.5 - 4.6 mg/dL Final   Performed at [facility], [address] [place], [place] [address]   Magnesium 09/26/2023 2.1  1.7 - 2.4 mg/dL Final   Performed at [facility], [address], [place], [place] [address]   WBC 09/27/2023 5.4  4.0 - 10.5 K/uL Final   RBC 09/27/2023 4.42  4.22 - 5.81 MIL/uL Final   Hemoglobin 09/27/2023  14.5  13.0 - 17.0 g/dL Final   HCT 32/44/0102 42.1  39.0 - 52.0 % Final   MCV 09/27/2023 95.2  80.0 - 100.0 fL Final   MCH 09/27/2023 32.8  26.0 - 34.0 pg Final   MCHC 09/27/2023 34.4  30.0 - 36.0 g/dL Final   RDW 72/53/6644 13.0  11.5 - 15.5 % Final   Platelets 09/27/2023 163  150 - 400 K/uL Final   nRBC 09/27/2023 0.0  0.0 - 0.2 % Final   Performed at [facility], [address] [place], [place] [address]   Sodium 09/27/2023 136  135 - 145 mmol/L Final   Potassium 09/27/2023 4.4  3.5 - 5.1 mmol/L Final   Chloride 09/27/2023 104  98 - 111 mmol/L Final   CO2 09/27/2023 25  22 - 32 mmol/L Final   Glucose, Bld 09/27/2023 96  70 - 99 mg/dL Final   Glucose reference range applies only to samples taken after fasting for at least 8 hours.   BUN  09/27/2023 11  6 - 20 mg/dL Final   Creatinine, Ser 09/27/2023 0.75  0.61 - 1.24 mg/dL Final   Calcium 62/95/2841 9.3  8.9 - 10.3 mg/dL Final   GFR, Estimated 09/27/2023 >60  >60 mL/min Final   Comment: (NOTE) Calculated using the CKD-EPI Creatinine Equation (2021)    Anion gap 09/27/2023 7  5 - 15 Final   Performed at [facility], [address] [place], [place] [address]   Phosphorus 09/27/2023 4.5  2.5 - 4.6 mg/dL Final   Performed at [facility], [address] [place], [place] [address]   Magnesium 09/27/2023 2.2  1.7 - 2.4 mg/dL Final   Performed at [facility], [address], [place], [place] [address]   Glucose-Capillary 09/26/2023 98  70 - 99 mg/dL Final   Glucose reference range applies only to samples taken after fasting for at least 8 hours.   WBC 09/28/2023 5.6  4.0 - 10.5 K/uL Final   RBC 09/28/2023 4.35  4.22 - 5.81 MIL/uL Final   Hemoglobin 09/28/2023 14.5  13.0 - 17.0 g/dL Final   HCT 40/34/7425 42.1  39.0 - 52.0 % Final   MCV 09/28/2023 96.8  80.0 - 100.0 fL Final   MCH 09/28/2023 33.3  26.0 - 34.0 pg Final   MCHC 09/28/2023 34.4  30.0 - 36.0 g/dL Final   RDW 95/63/8756 12.9  11.5 - 15.5 % Final    Platelets 09/28/2023 182  150 - 400 K/uL Final   nRBC 09/28/2023 0.0  0.0 - 0.2 % Final   Performed at [facility], [address] [place], [place] [address]   Sodium 09/28/2023 135  135 - 145 mmol/L Final   Potassium 09/28/2023 4.2  3.5 - 5.1 mmol/L Final   Chloride 09/28/2023 104  98 - 111 mmol/L Final   CO2 09/28/2023 24  22 - 32 mmol/L Final   Glucose, Bld 09/28/2023 102 (H)  70 - 99 mg/dL Final   Glucose reference range applies only to samples taken after fasting for at least 8 hours.   BUN 09/28/2023 16  6 - 20 mg/dL Final   Creatinine, Ser 09/28/2023 0.75  0.61 - 1.24 mg/dL Final   Calcium 51/88/4166 8.8 (L)  8.9 - 10.3 mg/dL Final   GFR, Estimated 09/28/2023 >60  >60 mL/min Final   Comment: (NOTE) Calculated using the CKD-EPI Creatinine Equation (2021)    Anion gap 09/28/2023 7  5 - 15 Final   Performed at [facility], [address], [place], [place] [address]   Phosphorus 09/28/2023 3.9  2.5 - 4.6 mg/dL Final   Performed at [facility], [address], [place], [place] [address]   Magnesium 09/28/2023 1.9  1.7 - 2.4 mg/dL Final   Performed at [facility], [address], [place], [place] [address]   Total Protein 09/28/2023 6.6  6.5 - 8.1 g/dL Final   Albumin 73/22/0254 3.7  3.5 - 5.0 g/dL Final   AST 27/03/2375 28  15 - 41 U/L Final   ALT 09/28/2023 38  0 - 44 U/L Final   Alkaline Phosphatase 09/28/2023 40  38 - 126 U/L Final   Total Bilirubin 09/28/2023 0.7  <1.2 mg/dL Final   Bilirubin, Direct 09/28/2023 0.1  0.0 - 0.2 mg/dL Final   Indirect Bilirubin 09/28/2023 0.6  0.3 - 0.9 mg/dL Final   Performed at [facility], [address] [place], [place] [address]  Lipase 09/28/2023 84 (H)  11 - 51 U/L Final   Performed at [facility], [address], [place], [place] [address]    Blood Alcohol level:  Lab Results  Component Value Date   ETH 109 (H) 01/22/2024   ETH 436 (HH) 12/24/2023    Metabolic Disorder Labs: Lab  Results  Component Value Date   HGBA1C 5.0 01/22/2024   MPG 96.8 01/22/2024   MPG 108 11/07/2014   No results found for: "PROLACTIN" Lab Results  Component Value Date   CHOL 202 (H) 01/22/2024   TRIG 65 01/22/2024   HDL 84 01/22/2024   CHOLHDL 2.4 01/22/2024   VLDL 13 01/22/2024   LDLCALC 105 (H) 01/22/2024   LDLCALC 137 (H) 01/09/2016    Therapeutic Lab Levels: No results found for: "LITHIUM" No results found for: "VALPROATE" No results found for: "CBMZ"  Physical Findings   AUDIT    Flowsheet Row ED from 01/23/2024 in [facility]  Alcohol Use Disorder Identification Test Final Score (AUDIT) 36      GAD-7    Flowsheet Row Counselor from 10/22/2023 in [facility]  Total GAD-7 Score 8      PHQ2-9    Flowsheet Row ED from 01/23/2024 in [facility] ED from 01/22/2024 in [facility] Counselor from 10/22/2023 in [facility] Office Visit from 08[facility]22/2016 in [facility] Visit from 01/09/2016 in [facility]  PHQ-2 Total Score 3 4 0 0 0  PHQ-9 Total Score 14 12 -- -- --      Flowsheet Row ED from 01/23/2024 in [facility] ED from 01/22/2024 in [facility] ED from 12/24/2023 in [facility] Emergency Department at [facility]  C-SSRS RISK CATEGORY Low Risk Error: Q3, 4, or 5 should not be populated when Q2 is No No Risk        Musculoskeletal  Strength & Muscle Tone: within normal limits Gait & Station: normal Patient leans: N/A  Psychiatric Specialty Exam  Presentation  General Appearance:  Appropriate for Environment; Fairly Groomed  Eye Contact: Fair  Speech: Clear and Coherent; Normal Rate  Speech Volume: Normal  Handedness: Right   Mood and Affect   Mood: Euthymic  Affect: Congruent; Appropriate   Thought Process  Thought Processes: Coherent  Descriptions of Associations:Intact  Orientation:Full (Time, Place and Person)  Thought Content:Logical  Diagnosis of Schizophrenia or Schizoaffective disorder in past: No    Hallucinations:Hallucinations: None  Ideas of Reference:None  Suicidal Thoughts:Suicidal Thoughts: No  Homicidal Thoughts:Homicidal Thoughts: No   Sensorium  Memory: Remote Good  Judgment: Fair  Insight: Fair   Art therapist  Concentration: Good  Attention Span: Good  Recall: Good  Fund of Knowledge: Good  Language: Good   Psychomotor Activity  Psychomotor Activity: Psychomotor Activity: Normal   Assets  Assets: Communication Skills; Resilience; Social Support   Sleep  Sleep: Sleep: Good    Physical Exam  Physical Exam Vitals reviewed.  Constitutional:      Appearance: Normal appearance.  HENT:     Head: Normocephalic and atraumatic.  Cardiovascular:     Rate and Rhythm: Normal rate.  Pulmonary:     Effort: Pulmonary effort is normal.  Skin:    Findings: Lesion present.       Neurological:     General: No focal  deficit present.     Mental Status: He is alert and oriented to person, place, and time.    Review of Systems  Constitutional:  Negative for chills and fever.  Respiratory:  Negative for shortness of breath.   Cardiovascular:  Negative for chest pain and palpitations.  Gastrointestinal:  Negative for nausea and vomiting.  Neurological:  Positive for tremors. Negative for headaches.   Blood pressure 126/82, pulse 62, temperature 98.1 F (36.7 C), temperature source Oral, resp. rate 16, SpO2 100%. There is no height or weight on file to calculate BMI.  Treatment Plan Summary: Daily contact with patient to assess and evaluate symptoms and progress in treatment and Medication management   Trevor [name] is a 43 yr old male who  presented on 3/28 to [facility] requesting help for EtOH Abuse, he was admitted to [facility] on 3/29.  PPHx is significant for Depression, Anxiety, EtOH Abuse, and has been to [facility] times, and no history of Suicide Attempts, Self Injurious Behavior, or Psychiatric Hospitalizations.  PMHx is significant for Withdrawal Seizures.     MDD, Recurrent, Moderate  GAD: -Continue Zoloft 50 mg daily for depression and anxiety. -Continue Agitation Protocol: Zyprexa   Alcohol Withdrawal: AST 44,ALT 46; EtOH 109 -Continue CIWA, last score= 2 -Completed Ativan taper 4/1 -Continue Ativan 1 mg q6 PRN CIWA>10 -Continue Imodium 2-4 mg PRN diarrhea -Continue Zofran-ODT 4 mg q6 PRN nausea -Continue Thiamine 100 mg daily for nutritional supplementation -Continue Multivitamin daily for nutritional supplementation   -Continue PRN's: Tylenol, Maalox, Atarax, Milk of Magnesia, Trazodone   HSV -Per ID recommendation, start valacyclovir 1000mg  bid for 10 total days - last dose on 4/13   Dispo: [facility] 4/8  Lance Muss, MD 01/29/2024 8:19 AM

## 2024-01-29 NOTE — Group Note (Signed)
 Group Topic: Balance in Life  Group Date: 01/29/2024 Start Time: 1500 End Time: 1530 Facilitators: Concha Norway, NT  Department: [facility]  Number of Participants: 4  Group Focus: acceptance Treatment Modality:  Behavior Modification Therapy Interventions utilized were exploration Purpose: express feelings  Name: Trevor [name] Date of Birth: December 25, 1980  MR: [id number]    Level of Participation: minimal Quality of Participation: attentive Interactions with others: gave feedback Mood/Affect: appropriate Triggers (if applicable):   Cognition: confused Progress: Minimal Response:  Plan: follow-up needed  Patients Problems:  Patient Active Problem List   Diagnosis Date Noted   Alcohol use disorder 01/23/2024   Hyponatremia 06/19/2022   Transaminitis 06/18/2022   GERD (gastroesophageal reflux disease) 06/18/2022   Hypokalemia 06/18/2022   Loss of consciousness (HCC) 06/18/2022   QT prolongation 06/18/2022   Chronic hepatitis C without hepatic coma (HCC) 06/28/2019   Alcohol abuse 06/28/2019   Psoriasis 06/07/2019   Suicidal ideation 06/07/2019   Withdrawal symptoms, alcohol, with delirium (HCC) 06/07/2019   Alcoholic hepatitis without ascites 06/05/2019   Alcohol withdrawal (HCC) 06/03/2019   Obesity (BMI 30-39.9) 01/09/2016   HSV-2 (herpes simplex virus 2) infection

## 2024-01-29 NOTE — Group Note (Signed)
 Group Topic: Positive Affirmations  Group Date: 01/29/2024 Start Time: 2015 End Time: 2100 Facilitators: Darin Engels  Department: [facility]  Number of Participants: 3  Group Focus: leisure skills, self-esteem, and social skills Treatment Modality:  Leisure Development Interventions utilized were leisure development, reminiscence, story telling, and support Purpose: express feelings, express irrational fears, and regain self-worth  Name: Trevor [name] Date of Birth: 1981/07/05  MR: [id number]    Level of Participation: active Quality of Participation: attentive, cooperative, and supportive Interactions with others: gave feedback Mood/Affect: appropriate and positive Triggers (if applicable): n/a Cognition: coherent/clear Progress: Gaining insight Response: pt had a positive attitude and was looking forward to his journey with sobriety, he offered support to other group participants.  Plan: patient will be encouraged to continue attending groups   Patients Problems:  Patient Active Problem List   Diagnosis Date Noted   Alcohol use disorder 01/23/2024   Hyponatremia 06/19/2022   Transaminitis 06/18/2022   GERD (gastroesophageal reflux disease) 06/18/2022   Hypokalemia 06/18/2022   Loss of consciousness (HCC) 06/18/2022   QT prolongation 06/18/2022   Chronic hepatitis C without hepatic coma (HCC) 06/28/2019   Alcohol abuse 06/28/2019   Psoriasis 06/07/2019   Suicidal ideation 06/07/2019   Withdrawal symptoms, alcohol, with delirium (HCC) 06/07/2019   Alcoholic hepatitis without ascites 06/05/2019   Alcohol withdrawal (HCC) 06/03/2019   Obesity (BMI 30-39.9) 01/09/2016   HSV-2 (herpes simplex virus 2) infection

## 2024-01-29 NOTE — ED Notes (Signed)
 Pt is in the dayroom watching TV with peers. Pt denies SI/HI/AVH. Pt has no further complain.No acute distress noted. Will continue to monitor for safety and provide support.

## 2024-01-29 NOTE — ED Notes (Signed)
 Pt observable in milieu, participating in groups.  Calm and pleasant.  Pt received atarax for anxiety and reported that the medication helped

## 2024-01-29 NOTE — [facility] Group Notes (Signed)
 SPIRITUALITY GROUP NOTE  Spirituality group facilitated by Wilkie Aye, MDiv, B[facility].  Group Description: Group focused on topic of hope. Patients participated in facilitated discussion around topic, connecting with one another around experiences and definitions for hope. Group members engaged with visual explorer photos, reflecting on what hope looks like for them today. Group engaged in discussion around how their definitions of hope are present today in hospital.  Modalities: Psycho-social ed, Adlerian, Narrative, MI  Patient Progress: Present throughout group.  Engaged appropriately with peers and facilitator

## 2024-01-29 NOTE — Group Note (Unsigned)
 Group Topic: Relaxation  Group Date: 01/29/2024 Start Time: 1225 End Time: 1355 Facilitators: Nelva Bush, RN  Department: [facility]  Number of Participants: 4  Group Focus: relaxation Treatment Modality:  Psychoeducation Interventions utilized were group exercise Purpose: reinforce self-care   Name: Trevor [name] Date of Birth: 1980-11-27  MR: [id number]    Level of Participation: {THERAPIES; PSYCH GROUP PARTICIPATION [id number]:19147} Quality of Participation: {THERAPIES; PSYCH QUALITY OF PARTICIPATION:23992} Interactions with others: {THERAPIES; PSYCH INTERACTIONS:23993} Mood/Affect: {THERAPIES; PSYCH MOOD/AFFECT:23994} Triggers (if applicable): *** Cognition: {THERAPIES; PSYCH COGNITION:23995} Progress: {THERAPIES; PSYCH PROGRESS:23997} Response: *** Plan: {THERAPIES; PSYCH WGNF:62130}  Patients Problems:  Patient Active Problem List   Diagnosis Date Noted   Alcohol use disorder 01/23/2024   Hyponatremia 06/19/2022   Transaminitis 06/18/2022   GERD (gastroesophageal reflux disease) 06/18/2022   Hypokalemia 06/18/2022   Loss of consciousness (HCC) 06/18/2022   QT prolongation 06/18/2022   Chronic hepatitis C without hepatic coma (HCC) 06/28/2019   Alcohol abuse 06/28/2019   Psoriasis 06/07/2019   Suicidal ideation 06/07/2019   Withdrawal symptoms, alcohol, with delirium (HCC) 06/07/2019   Alcoholic hepatitis without ascites 06/05/2019   Alcohol withdrawal (HCC) 06/03/2019   Obesity (BMI 30-39.9) 01/09/2016   HSV-2 (herpes simplex virus 2) infection

## 2024-01-30 ENCOUNTER — Encounter (HOSPITAL_COMMUNITY): Payer: Self-pay | Admitting: Psychiatry

## 2024-01-30 DIAGNOSIS — F411 Generalized anxiety disorder: Secondary | ICD-10-CM | POA: Diagnosis not present

## 2024-01-30 DIAGNOSIS — F1020 Alcohol dependence, uncomplicated: Secondary | ICD-10-CM | POA: Diagnosis not present

## 2024-01-30 DIAGNOSIS — F331 Major depressive disorder, recurrent, moderate: Secondary | ICD-10-CM | POA: Diagnosis not present

## 2024-01-30 DIAGNOSIS — A6002 Herpesviral infection of other male genital organs: Secondary | ICD-10-CM | POA: Diagnosis not present

## 2024-01-30 LAB — COMPREHENSIVE METABOLIC PANEL WITH GFR
ALT: 19 U/L (ref 0–44)
AST: 18 U/L (ref 15–41)
Albumin: 4.1 g/dL (ref 3.5–5.0)
Alkaline Phosphatase: 43 U/L (ref 38–126)
Anion gap: 6 (ref 5–15)
BUN: 17 mg/dL (ref 6–20)
CO2: 30 mmol/L (ref 22–32)
Calcium: 8.9 mg/dL (ref 8.9–10.3)
Chloride: 101 mmol/L (ref 98–111)
Creatinine, Ser: 1.03 mg/dL (ref 0.61–1.24)
GFR, Estimated: >60 mL/min
Glucose, Bld: 92 mg/dL (ref 70–99)
Potassium: 4.1 mmol/L (ref 3.5–5.1)
Sodium: 137 mmol/L (ref 135–145)
Total Bilirubin: 0.6 mg/dL (ref 0.0–1.2)
Total Protein: 7.2 g/dL (ref 6.5–8.1)

## 2024-01-30 MED ORDER — SERTRALINE HCL 50 MG PO TABS
50.0000 mg | ORAL_TABLET | ORAL | Status: DC
  Administered 2024-01-31 – 2024-02-02 (×3): 50 mg via ORAL
  Filled 2024-01-30: qty 1
  Filled 2024-01-30: qty 14
  Filled 2024-01-30 (×2): qty 1

## 2024-01-30 MED ORDER — PROPRANOLOL HCL 10 MG PO TABS
10.0000 mg | ORAL_TABLET | Freq: Three times a day (TID) | ORAL | Status: DC | PRN
  Administered 2024-01-31: 10 mg via ORAL
  Filled 2024-01-30: qty 1

## 2024-01-30 MED ORDER — NALTREXONE HCL 50 MG PO TABS
50.0000 mg | ORAL_TABLET | Freq: Every day | ORAL | Status: DC
  Administered 2024-01-30 – 2024-02-01 (×3): 50 mg via ORAL
  Filled 2024-01-30 (×2): qty 1
  Filled 2024-01-30: qty 14
  Filled 2024-01-30: qty 1

## 2024-01-30 NOTE — ED Provider Notes (Signed)
 Behavioral Health Progress Note  Date and Time: 01/30/2024 1:51 PM Name: Trevor [name] MRN:  [id number]  Subjective:  Trevor [name] is a 42 yr old male who presented on 3/28 to [facility] requesting help for EtOH Abuse, he was admitted to [facility] on 3/29 for alcohol detox and rehab services. PPHx is significant for MDD, GAD, AUD (ho sz and DTs), and has been to Residential Rehab 4 times, and no history of Suicide Attempts, Self Injurious Behavior, or Psychiatric Hospitalizations.   Denied med side effects. Denied etoh cravings. Still having some withdrawal sxs, tremulousness which is improving. Has been getting PRN vistaril, which helps some. Discussed propranolol for anxiety instead. No h/o asthma or DM.  Amenable to naltrexone, he has tried vivitrol in the past, denied side effects. No PCP at this time, planning on going to [place] for 3 month rehab program. Rash has started feeling better.   Mood:Anxious (not as bad as it was. mainly feeling somatic sxs rather than cognitive ruminations) Sleep:Fair Appetite: Good SI:No HI:No WUJ:WJXB Delusions:None  Diagnosis:  Final diagnoses:  Moderate episode of recurrent major depressive disorder (HCC)  GAD (generalized anxiety disorder)  Alcohol use disorder, severe, dependence (HCC)  HSV-2 (herpes simplex virus 2) infection  H/O withdrawal symptoms, alcohol, with delirium and seizures (HCC)  Chronic hepatitis C without hepatic coma (HCC)   Total Time spent with patient: 20 minutes  Past Psychiatric History:  Dx: AUD, MDD, GAD Residential Rehab 4 times,  no history of Suicide Attempts, Self Injurious Behavior, or Psychiatric Hospitalizations.  Past rx: wellbutrin (~2017-2020, lexapro adjunct) lexapro (maxed 2016), trazodone, vivitrol Outpatient tx: No psychiatrist Therapy - [name], LCAS at [facility]    Past Medical History:  EtOH withdrawal seizures and DT (see 06/23/2022 dc summary), required phenobarbital and precedex Prolonged  Qtc Alcoholic hepatitis Chronic hep C HSV   Family History:  Mother- Skin Cancer Paternal Grandfather- Colon Cancer Paternal Grandmother and Grandfather- Lung Cancer   Family Psychiatric  History:  Brother- EtOH Abuse Maternal Grandfather and Grandmother- EtOH Abuse No Known Diagnosis' or Suicides   Social History:  Divorced, has a daughter. Recently fired from his job. Started drinking at age 30, longest period of sobriety 6 months, been to Residential Rehab 4 times.    Current Medications:  Current Facility-Administered Medications  Medication Dose Route Frequency Provider Last Rate Last Admin   acetaminophen (TYLENOL) tablet 650 mg  650 mg Oral Q6H PRN Marlou Sa, NP       alum & mag hydroxide-simeth (MAALOX/MYLANTA) 200-200-20 MG/5ML suspension 30 mL  30 mL Oral Q4H PRN Rayburn Go, Veronique M, NP       gabapentin (NEURONTIN) capsule 200 mg  200 mg Oral TID Miguel Rota, MD   200 mg at 01/30/24 1478   hydrOXYzine (ATARAX) tablet 25 mg  25 mg Oral Q6H PRN Miguel Rota, MD   25 mg at 01/30/24 1045   magnesium hydroxide (MILK OF MAGNESIA) suspension 30 mL  30 mL Oral Daily PRN Marlou Sa, NP   30 mL at 01/25/24 1309   multivitamin with minerals tablet 1 tablet  1 tablet Oral Daily Marlou Sa, NP   1 tablet at 01/30/24 2956   naltrexone (DEPADE) tablet 50 mg  50 mg Oral QHS Princess Bruins, DO       OLANZapine [facility]) injection 10 mg  10 mg Intramuscular TID PRN Marlou Sa, NP       OLANZapine (ZYPREXA) injection 5 mg  5  mg Intramuscular TID PRN Marlou Sa, NP       OLANZapine zydis (ZYPREXA) disintegrating tablet 5 mg  5 mg Oral TID PRN Marlou Sa, NP       propranolol (INDERAL) tablet 10 mg  10 mg Oral Q8H PRN Princess Bruins, DO       [START ON 01/31/2024] sertraline (ZOLOFT) tablet 50 mg  50 mg Oral Mignon Pine, Raynelle Fanning, DO       thiamine (VITAMIN B1) tablet 100 mg  100 mg Oral Daily Rayburn Go, Veronique M, NP   100 mg  at 01/30/24 7829   traZODone (DESYREL) tablet 50 mg  50 mg Oral QHS PRN Marlou Sa, NP   50 mg at 01/29/24 2105   valACYclovir (VALTREX) tablet 1,000 mg  1,000 mg Oral BID Kizzie Ide B, MD   1,000 mg at 01/30/24 5621   Current Outpatient Medications  Medication Sig Dispense Refill   famotidine (PEPCID) 20 MG tablet Take 20 mg by mouth 2 (two) times daily. (Patient not taking: Reported on 01/22/2024)     hydrOXYzine (ATARAX) 50 MG tablet Take 50 mg by mouth every 8 (eight) hours as needed for anxiety. (Patient not taking: Reported on 01/22/2024)     Multiple Vitamin (MULTIVITAMIN WITH MINERALS) TABS tablet Take 1 tablet by mouth daily.     thiamine (VITAMIN B1) 100 MG tablet Take 1 tablet (100 mg total) by mouth daily. (Patient not taking: Reported on 01/22/2024) 30 tablet 0   traZODone (DESYREL) 150 MG tablet Take 150 mg by mouth at bedtime. (Patient not taking: Reported on 01/22/2024)      Labs  Lab Results:  Admission on 01/22/2024, Discharged on 01/23/2024  Component Date Value Ref Range Status   WBC 01/22/2024 6.8  4.0 - 10.5 K/uL Final   RBC 01/22/2024 5.22  4.22 - 5.81 MIL/uL Final   Hemoglobin 01/22/2024 17.2 (H)  13.0 - 17.0 g/dL Final   HCT 30/86/5784 47.8  39.0 - 52.0 % Final   MCV 01/22/2024 91.6  80.0 - 100.0 fL Final   MCH 01/22/2024 33.0  26.0 - 34.0 pg Final   MCHC 01/22/2024 36.0  30.0 - 36.0 g/dL Final   RDW 69/62/9528 12.1  11.5 - 15.5 % Final   Platelets 01/22/2024 319  150 - 400 K/uL Final   nRBC 01/22/2024 0.0  0.0 - 0.2 % Final   Neutrophils Relative % 01/22/2024 70  % Final   Neutro Abs 01/22/2024 4.8  1.7 - 7.7 K/uL Final   Lymphocytes Relative 01/22/2024 25  % Final   Lymphs Abs 01/22/2024 1.7  0.7 - 4.0 K/uL Final   Monocytes Relative 01/22/2024 4  % Final   Monocytes Absolute 01/22/2024 0.3  0.1 - 1.0 K/uL Final   Eosinophils Relative 01/22/2024 0  % Final   Eosinophils Absolute 01/22/2024 0.0  0.0 - 0.5 K/uL Final   Basophils Relative  01/22/2024 1  % Final   Basophils Absolute 01/22/2024 0.1  0.0 - 0.1 K/uL Final   Immature Granulocytes 01/22/2024 0  % Final   Abs Immature Granulocytes 01/22/2024 0.02  0.00 - 0.07 K/uL Final   Performed at [facility], [address] [place], [place] [address]   Sodium 01/22/2024 138  135 - 145 mmol/L Final   Potassium 01/22/2024 3.9  3.5 - 5.1 mmol/L Final   Chloride 01/22/2024 99  98 - 111 mmol/L Final   CO2 01/22/2024 25  22 - 32 mmol/L Final   Glucose, Bld  01/22/2024 99  70 - 99 mg/dL Final   Glucose reference range applies only to samples taken after fasting for at least 8 hours.   BUN 01/22/2024 13  6 - 20 mg/dL Final   Creatinine, Ser 01/22/2024 0.83  0.61 - 1.24 mg/dL Final   Calcium 13/05/6577 8.9  8.9 - 10.3 mg/dL Final   Total Protein 46/96/2952 7.5  6.5 - 8.1 g/dL Final   Albumin 84/13/2440 4.6  3.5 - 5.0 g/dL Final   AST 08/23/2535 44 (H)  15 - 41 U/L Final   ALT 01/22/2024 46 (H)  0 - 44 U/L Final   Alkaline Phosphatase 01/22/2024 60  38 - 126 U/L Final   Total Bilirubin 01/22/2024 0.8  0.0 - 1.2 mg/dL Final   GFR, Estimated 01/22/2024 >60  >60 mL/min Final   Comment: (NOTE) Calculated using the CKD-EPI Creatinine Equation (2021)    Anion gap 01/22/2024 14  5 - 15 Final   Performed at [facility], [address] [place], [place] [address]   Hgb A1c MFr Bld 01/22/2024 5.0  4.8 - 5.6 % Final   Comment: (NOTE) Pre diabetes:          5.7%-6.4%  Diabetes:              >6.4%  Glycemic control for   <7.0% adults with diabetes    Mean Plasma Glucose 01/22/2024 96.8  mg/dL Final   Performed at [facility], [address] [place], [place] [address]   Magnesium 01/22/2024 1.9  1.7 - 2.4 mg/dL Final   Performed at [facility], [address], [place], [place] [address]   Alcohol, Ethyl (B) 01/22/2024 109 (H)  <10 mg/dL Final   Comment: (NOTE) Lowest detectable limit for serum alcohol is 10 mg/dL.  For medical purposes only. Performed at  [facility], [address] [place], [place] [address]    Cholesterol 01/22/2024 202 (H)  0 - 200 mg/dL Final   Triglycerides 32/95/1884 65  <150 mg/dL Final   HDL 16/60/6301 84  >40 mg/dL Final   Total CHOL/HDL Ratio 01/22/2024 2.4  RATIO Final   VLDL 01/22/2024 13  0 - 40 mg/dL Final   LDL Cholesterol 01/22/2024 105 (H)  0 - 99 mg/dL Final   Comment:        Total Cholesterol/HDL:CHD Risk Coronary Heart Disease Risk Table                     Men   Women  1/2 Average Risk   3.4   3.3  Average Risk       5.0   4.4  2 X Average Risk   9.6   7.1  3 X Average Risk  23.4   11.0        Use the calculated Patient Ratio above and the CHD Risk Table to determine the patient's CHD Risk.        ATP III CLASSIFICATION (LDL):  <100     mg/dL   Optimal  601-093  mg/dL   Near or Above                    Optimal  130-159  mg/dL   Borderline  235-573  mg/dL   High  >220     mg/dL   Very High Performed at [facility], [address], [place], [place] [address]    TSH 01/22/2024 0.458  0.350 - 4.500 uIU/mL Final   Comment: Performed by a 3rd Generation assay with a functional sensitivity of <=0.01 uIU/mL. Performed at [facility], [address], [place], [place] [address]    RPR Ser Ql 01/22/2024 NON REACTIVE  NON REACTIVE Final   Performed at [facility], [address], [place], [place] [address]   Color, Urine 01/22/2024 YELLOW  YELLOW Final   APPearance 01/22/2024 CLEAR  CLEAR Final   Specific Gravity, Urine 01/22/2024 1.020  1.005 - 1.030 Final   pH 01/22/2024 5.0  5.0 - 8.0 Final   Glucose, UA 01/22/2024 NEGATIVE  NEGATIVE mg/dL Final   Hgb urine dipstick 01/22/2024 NEGATIVE  NEGATIVE Final   Bilirubin Urine 01/22/2024 NEGATIVE  NEGATIVE Final   Ketones, ur 01/22/2024 5 (A)  NEGATIVE mg/dL Final   Protein, ur 09/81/1914 NEGATIVE  NEGATIVE mg/dL Final   Nitrite 78/29/5621 NEGATIVE  NEGATIVE Final   Leukocytes,Ua 01/22/2024 NEGATIVE  NEGATIVE Final    Performed at [facility], [address], [place], [place] [address]   POC Amphetamine UR 01/22/2024 None Detected   Final   POC Secobarbital (BAR) 01/22/2024 None Detected   Final   POC Buprenorphine (BUP) 01/22/2024 None Detected   Final   POC Oxazepam (BZO) 01/22/2024 None Detected   Final   POC Cocaine UR 01/22/2024 None Detected   Final   POC Methamphetamine UR 01/22/2024 None Detected   Final   POC Morphine 01/22/2024 None Detected   Final   POC Methadone UR 01/22/2024 None Detected   Final   POC Oxycodone UR 01/22/2024 None Detected   Final   POC Marijuana UR 01/22/2024 None Detected   Final  Admission on 12/24/2023, Discharged on 12/25/2023  Component Date Value Ref Range Status   Sodium 12/24/2023 144  135 - 145 mmol/L Final   Potassium 12/24/2023 3.8  3.5 - 5.1 mmol/L Final   Chloride 12/24/2023 107  98 - 111 mmol/L Final   CO2 12/24/2023 26  22 - 32 mmol/L Final   Glucose, Bld 12/24/2023 96  70 - 99 mg/dL Final   Glucose reference range applies only to samples taken after fasting for at least 8 hours.   BUN 12/24/2023 11  6 - 20 mg/dL Final   Creatinine, Ser 12/24/2023 0.62  0.61 - 1.24 mg/dL Final   Calcium 78/46/9629 8.6 (L)  8.9 - 10.3 mg/dL Final   Total Protein 52/84/1324 7.8  6.5 - 8.1 g/dL Final   Albumin 40/07/2724 4.3  3.5 - 5.0 g/dL Final   AST 36/64/4034 34  15 - 41 U/L Final   ALT 12/24/2023 27  0 - 44 U/L Final   Alkaline Phosphatase 12/24/2023 56  38 - 126 U/L Final   Total Bilirubin 12/24/2023 0.4  0.0 - 1.2 mg/dL Final   GFR, Estimated 12/24/2023 >60  >60 mL/min Final   Comment: (NOTE) Calculated using the CKD-EPI Creatinine Equation (2021)    Anion gap 12/24/2023 11  5 - 15 Final   Performed at [facility], [address] [place], [place] [address]   Alcohol, Ethyl (B) 12/24/2023 436 (HH)  <10 mg/dL Final   Comment: CRITICAL RESULT CALLED TO, READ BACK BY AND VERIFIED WITH Barnett Applebaum, RN 12/25/23 0050 BY K. DAVIS (NOTE) Lowest  detectable limit for serum alcohol is 10 mg/dL.  For medical purposes only. Performed at [facility], [address], [place], [place] [address]    Opiates 12/25/2023 NONE DETECTED  NONE DETECTED Final   Cocaine 12/25/2023 NONE DETECTED  NONE DETECTED Final   Benzodiazepines 12/25/2023 NONE DETECTED  NONE DETECTED Final   Amphetamines 12/25/2023 NONE DETECTED  NONE DETECTED Final   Tetrahydrocannabinol 12/25/2023 NONE DETECTED  NONE DETECTED Final   Barbiturates 12/25/2023 NONE DETECTED  NONE DETECTED Final   Comment: (NOTE) DRUG SCREEN FOR MEDICAL PURPOSES ONLY.  IF CONFIRMATION IS NEEDED FOR ANY PURPOSE, NOTIFY LAB WITHIN 5 DAYS.  LOWEST DETECTABLE LIMITS FOR URINE DRUG SCREEN Drug Class                     Cutoff (ng/mL) Amphetamine and metabolites    1000 Barbiturate and metabolites    200 Benzodiazepine                 200 Opiates and metabolites        300 Cocaine and metabolites        300 THC                            50 Performed at [facility], [address] [place], [place] [address]    WBC 12/24/2023 5.1  4.0 - 10.5 K/uL Final   RBC 12/24/2023 4.78  4.22 - 5.81 MIL/uL Final   Hemoglobin 12/24/2023 16.0  13.0 - 17.0 g/dL Final   HCT 60/45/4098 45.7  39.0 - 52.0 % Final   MCV 12/24/2023 95.6  80.0 - 100.0 fL Final   MCH 12/24/2023 33.5  26.0 - 34.0 pg Final   MCHC 12/24/2023 35.0  30.0 - 36.0 g/dL Final   RDW 11/91/4782 12.8  11.5 - 15.5 % Final   Platelets 12/24/2023 292  150 - 400 K/uL Final   nRBC 12/24/2023 0.0  0.0 - 0.2 % Final   Neutrophils Relative % 12/24/2023 61  % Final   Neutro Abs 12/24/2023 3.0  1.7 - 7.7 K/uL Final   Lymphocytes Relative 12/24/2023 31  % Final   Lymphs Abs 12/24/2023 1.6  0.7 - 4.0 K/uL Final   Monocytes Relative 12/24/2023 8  % Final   Monocytes Absolute 12/24/2023 0.4  0.1 - 1.0 K/uL Final   Eosinophils Relative 12/24/2023 0  % Final   Eosinophils Absolute 12/24/2023 0.0  0.0 - 0.5  K/uL Final   Basophils Relative 12/24/2023 0  % Final   Basophils Absolute 12/24/2023 0.0  0.0 - 0.1 K/uL Final   Immature Granulocytes 12/24/2023 0  % Final   Abs Immature Granulocytes 12/24/2023 0.02  0.00 - 0.07 K/uL Final   Performed at [facility], [address], [place], [place] [address]   Lipase 12/24/2023 41  11 - 51 U/L Final   Performed at [facility], [address] [place], [place] [address]   Color, Urine 12/25/2023 YELLOW  YELLOW Final   APPearance 12/25/2023 HAZY (A)  CLEAR Final   Specific Gravity, Urine 12/25/2023 1.010  1.005 - 1.030 Final   pH 12/25/2023 7.0  5.0 - 8.0 Final   Glucose, UA 12/25/2023 NEGATIVE  NEGATIVE mg/dL Final   Hgb urine dipstick 12/25/2023 NEGATIVE  NEGATIVE Final   Bilirubin Urine 12/25/2023 NEGATIVE  NEGATIVE Final   Ketones, ur 12/25/2023 NEGATIVE  NEGATIVE mg/dL Final   Protein, ur 78/46/9629 NEGATIVE  NEGATIVE mg/dL Final   Nitrite 52/84/1324 NEGATIVE  NEGATIVE Final   Leukocytes,Ua 12/25/2023 NEGATIVE  NEGATIVE Final   Performed at [facility], [address], [place], [place] [address]  Admission on 09/23/2023, Discharged on 09/29/2023  Component Date Value Ref Range Status   WBC 09/23/2023 5.2  4.0 - 10.5 K/uL Final   RBC 09/23/2023 5.10  4.22 - 5.81 MIL/uL Final   Hemoglobin 09/23/2023 16.9  13.0 - 17.0 g/dL Final   HCT 63/10/6008 46.5  39.0 - 52.0 % Final   MCV 09/23/2023 91.2  80.0 - 100.0 fL Final   MCH 09/23/2023 33.1  26.0 - 34.0 pg Final   MCHC 09/23/2023 36.3 (H)  30.0 - 36.0 g/dL Final   RDW 93/23/5573 12.9  11.5 - 15.5 % Final   Platelets 09/23/2023 185  150 - 400 K/uL Final   nRBC 09/23/2023 0.0  0.0 - 0.2 % Final   Performed at [facility], [address] [place], [place] [address]   Sodium 09/23/2023 139  135 - 145 mmol/L Final   Potassium 09/23/2023 3.5  3.5 - 5.1 mmol/L Final   Chloride 09/23/2023 99  98 - 111 mmol/L Final   CO2 09/23/2023 22  22 -  32 mmol/L Final   Glucose, Bld 09/23/2023 169 (H)  70 - 99 mg/dL Final   Glucose reference range applies only to samples taken after fasting for at least 8 hours.   BUN 09/23/2023 6  6 - 20 mg/dL Final   Creatinine, Ser 09/23/2023 0.71  0.61 - 1.24 mg/dL Final   Calcium 42/70/6237 8.4 (L)  8.9 - 10.3 mg/dL Final   Total Protein 62/83/1517 6.9  6.5 - 8.1 g/dL Final   Albumin 61/60/7371 4.0  3.5 - 5.0 g/dL Final   AST 04/21/9484 135 (H)  15 - 41 U/L Final   ALT 09/23/2023 76 (H)  0 - 44 U/L Final   Alkaline Phosphatase 09/23/2023 58  38 - 126 U/L Final   Total Bilirubin 09/23/2023 0.9  <1.2 mg/dL Final   GFR, Estimated 09/23/2023 >60  >60 mL/min Final   Comment: (NOTE) Calculated using the CKD-EPI Creatinine Equation (2021)    Anion gap 09/23/2023 18 (H)  5 - 15 Final   Performed at [facility], [address], [place], [place] [address]   Troponin I (High Sensitivity) 09/23/2023 14  <18 ng/L Final   Comment: (NOTE) Elevated high sensitivity troponin I (hsTnI) values and significant  changes across serial measurements may suggest ACS but many other  chronic and acute conditions are known to elevate hsTnI results.  Refer to the "Links" section for chest pain algorithms and additional  guidance. Performed at [facility], [address] [place], [place] [address]    Alcohol, Ethyl (B) 09/23/2023 412 (HH)  <10 mg/dL Final   Comment: CRITICAL RESULT CALLED TO, READ BACK BY AND VERIFIED WITH B. OSORIO, RN AT [phone] 11.27.24 JLASIGAN (NOTE) Lowest detectable limit for serum alcohol is 10 mg/dL.  For medical purposes only. Performed at [facility], [address], [place], [place] [address]    Lipase 09/23/2023 86 (H)  11 - 51 U/L Final   Performed at [facility], [address], [place], [place] [address]   Troponin I (High Sensitivity) 09/23/2023 19 (H)  <18 ng/L Final   Comment: (NOTE) Elevated high sensitivity troponin I (hsTnI) values and significant   changes across serial measurements may suggest ACS but many other  chronic and acute conditions are known to elevate hsTnI results.  Refer to the "Links" section for chest pain algorithms and additional  guidance. Performed  at [facility], [address], [place], [place] [address]    Magnesium 09/23/2023 1.8  1.7 - 2.4 mg/dL Final   Performed at [facility], [address] [place], [place] [address]   Phosphorus 09/23/2023 2.2 (L)  2.5 - 4.6 mg/dL Final   Performed at [facility], [address] [place], [place] [address]   Opiates 09/23/2023 NONE DETECTED  NONE DETECTED Final   Cocaine 09/23/2023 NONE DETECTED  NONE DETECTED Final   Benzodiazepines 09/23/2023 NONE DETECTED  NONE DETECTED Final   Amphetamines 09/23/2023 NONE DETECTED  NONE DETECTED Final   Tetrahydrocannabinol 09/23/2023 NONE DETECTED  NONE DETECTED Final   Barbiturates 09/23/2023 NONE DETECTED  NONE DETECTED Final   Comment: (NOTE) DRUG SCREEN FOR MEDICAL PURPOSES ONLY.  IF CONFIRMATION IS NEEDED FOR ANY PURPOSE, NOTIFY LAB WITHIN 5 DAYS.  LOWEST DETECTABLE LIMITS FOR URINE DRUG SCREEN Drug Class                     Cutoff (ng/mL) Amphetamine and metabolites    1000 Barbiturate and metabolites    200 Benzodiazepine                 200 Opiates and metabolites        300 Cocaine and metabolites        300 THC                            50 Performed at [facility], [address] [place], [place] [address]    HIV Screen 4th Generation wRfx 09/23/2023 Non Reactive  Non Reactive Final   Performed at [facility], [address], [place], [place] [address]   Phosphorus 09/24/2023 3.1  2.5 - 4.6 mg/dL Final   Performed at [facility], [address] [place], [place] [address]   Lipase 09/24/2023 92 (H)  11 - 51 U/L Final   Performed at [facility], [address], [place], [place] [address]   Sodium 09/24/2023 130 (L)  135 - 145 mmol/L Final   DELTA CHECK  NOTED   Potassium 09/24/2023 3.8  3.5 - 5.1 mmol/L Final   Chloride 09/24/2023 98  98 - 111 mmol/L Final   CO2 09/24/2023 24  22 - 32 mmol/L Final   Glucose, Bld 09/24/2023 94  70 - 99 mg/dL Final   Glucose reference range applies only to samples taken after fasting for at least 8 hours.   BUN 09/24/2023 9  6 - 20 mg/dL Final   Creatinine, Ser 09/24/2023 0.63  0.61 - 1.24 mg/dL Final   Calcium 32/44/0102 8.5 (L)  8.9 - 10.3 mg/dL Final   Total Protein 72/53/6644 5.6 (L)  6.5 - 8.1 g/dL Final   Albumin 03/47/4259 3.2 (L)  3.5 - 5.0 g/dL Final   AST 56/38/7564 78 (H)  15 - 41 U/L Final   ALT 09/24/2023 54 (H)  0 - 44 U/L Final   Alkaline Phosphatase 09/24/2023 42  38 - 126 U/L Final   Total Bilirubin 09/24/2023 1.2 (H)  <1.2 mg/dL Final   GFR, Estimated 09/24/2023 >60  >60 mL/min Final   Comment: (NOTE) Calculated using the CKD-EPI Creatinine Equation (2021)    Anion gap 09/24/2023 8  5 - 15 Final   Performed at [facility], [address] [place], [place] [address]   WBC 09/24/2023 4.1  4.0 - 10.5 K/uL Final   RBC 09/24/2023 3.97 (L)  4.22 - 5.81 MIL/uL Final   Hemoglobin 09/24/2023 13.2  13.0 - 17.0 g/dL Final   HCT 10/96/0454 37.2 (L)  39.0 - 52.0 % Final   MCV 09/24/2023 93.7  80.0 - 100.0 fL Final   MCH 09/24/2023 33.2  26.0 - 34.0 pg Final   MCHC 09/24/2023 35.5  30.0 - 36.0 g/dL Final   RDW 09/81/1914 12.8  11.5 - 15.5 % Final   Platelets 09/24/2023 130 (L)  150 - 400 K/uL Final   nRBC 09/24/2023 0.0  0.0 - 0.2 % Final   Neutrophils Relative % 09/24/2023 60  % Final   Neutro Abs 09/24/2023 2.5  1.7 - 7.7 K/uL Final   Lymphocytes Relative 09/24/2023 27  % Final   Lymphs Abs 09/24/2023 1.1  0.7 - 4.0 K/uL Final   Monocytes Relative 09/24/2023 11  % Final   Monocytes Absolute 09/24/2023 0.4  0.1 - 1.0 K/uL Final   Eosinophils Relative 09/24/2023 1  % Final   Eosinophils Absolute 09/24/2023 0.0  0.0 - 0.5 K/uL Final   Basophils Relative 09/24/2023 1  % Final    Basophils Absolute 09/24/2023 0.0  0.0 - 0.1 K/uL Final   Immature Granulocytes 09/24/2023 0  % Final   Abs Immature Granulocytes 09/24/2023 0.01  0.00 - 0.07 K/uL Final   Performed at [facility], [address] [place], [place] [address]   Magnesium 09/24/2023 1.6 (L)  1.7 - 2.4 mg/dL Final   Performed at [facility], [address] [place], [place] [address]   WBC 09/25/2023 5.1  4.0 - 10.5 K/uL Final   RBC 09/25/2023 4.20 (L)  4.22 - 5.81 MIL/uL Final   Hemoglobin 09/25/2023 13.5  13.0 - 17.0 g/dL Final   HCT 86/57/8469 40.0  39.0 - 52.0 % Final   MCV 09/25/2023 95.2  80.0 - 100.0 fL Final   MCH 09/25/2023 32.1  26.0 - 34.0 pg Final   MCHC 09/25/2023 33.8  30.0 - 36.0 g/dL Final   RDW 62/95/2841 13.1  11.5 - 15.5 % Final   Platelets 09/25/2023 138 (L)  150 - 400 K/uL Final   REPEATED TO VERIFY   nRBC 09/25/2023 0.0  0.0 - 0.2 % Final   Performed at [facility], [address], [place], [place] [address]   Sodium 09/25/2023 134 (L)  135 - 145 mmol/L Final   Potassium 09/25/2023 3.9  3.5 - 5.1 mmol/L Final   Chloride 09/25/2023 102  98 - 111 mmol/L Final   CO2 09/25/2023 22  22 - 32 mmol/L Final   Glucose, Bld 09/25/2023 92  70 - 99 mg/dL Final   Glucose reference range applies only to samples taken after fasting for at least 8 hours.   BUN 09/25/2023 12  6 - 20 mg/dL Final   Creatinine, Ser 09/25/2023 0.61  0.61 - 1.24 mg/dL Final   Calcium 08/23/2535 9.0  8.9 - 10.3 mg/dL Final   GFR, Estimated 09/25/2023 >60  >60 mL/min Final   Comment: (NOTE) Calculated using the CKD-EPI Creatinine Equation (2021)    Anion gap 09/25/2023 10  5 - 15 Final   Performed at [facility], [address] [place], [place] [address]   Phosphorus 09/25/2023 5.5 (H)  2.5 - 4.6 mg/dL Final   Performed at [facility], [address] [place], [place] [address]   Magnesium 09/25/2023 2.2  1.7 -  2.4 mg/dL Final   Performed at [facility], [address],  [place], [place] [address]   Glucose-Capillary 09/25/2023 88  70 - 99 mg/dL Final   Glucose reference range applies only to samples taken after fasting for at least 8 hours.   Glucose-Capillary 09/25/2023 101 (H)  70 - 99 mg/dL Final   Glucose reference range applies only to samples taken after fasting for at least 8 hours.   Glucose-Capillary 09/25/2023 109 (H)  70 - 99 mg/dL Final   Glucose reference range applies only to samples taken after fasting for at least 8 hours.   WBC 09/26/2023 5.2  4.0 - 10.5 K/uL Final   RBC 09/26/2023 4.26  4.22 - 5.81 MIL/uL Final   Hemoglobin 09/26/2023 14.0  13.0 - 17.0 g/dL Final   HCT 60/45/4098 40.3  39.0 - 52.0 % Final   MCV 09/26/2023 94.6  80.0 - 100.0 fL Final   MCH 09/26/2023 32.9  26.0 - 34.0 pg Final   MCHC 09/26/2023 34.7  30.0 - 36.0 g/dL Final   RDW 11/91/4782 13.1  11.5 - 15.5 % Final   Platelets 09/26/2023 143 (L)  150 - 400 K/uL Final   nRBC 09/26/2023 0.0  0.0 - 0.2 % Final   Performed at [facility], [address] [place] [address], [place], [place] [address]   Sodium 09/26/2023 134 (L)  135 - 145 mmol/L Final   Potassium 09/26/2023 4.4  3.5 - 5.1 mmol/L Final   Chloride 09/26/2023 106  98 - 111 mmol/L Final   CO2 09/26/2023 21 (L)  22 - 32 mmol/L Final   Glucose, Bld 09/26/2023 104 (H)  70 - 99 mg/dL Final   Glucose reference range applies only to samples taken after fasting for at least 8 hours.   BUN 09/26/2023 19  6 - 20 mg/dL Final   Creatinine, Ser 09/26/2023 0.60 (L)  0.61 - 1.24 mg/dL Final   Calcium 30/86/5784 8.6 (L)  8.9 - 10.3 mg/dL Final   GFR, Estimated 09/26/2023 >60  >60 mL/min Final   Comment: (NOTE) Calculated using the CKD-EPI Creatinine Equation (2021)    Anion gap 09/26/2023 7  5 - 15 Final   Performed at [facility], [address], [place], [place] [address]   Phosphorus 09/26/2023 3.9  2.5 - 4.6 mg/dL Final   Performed at [facility], [address] [place], [place] [address]   Magnesium 09/26/2023 2.1  1.7  - 2.4 mg/dL Final   Performed at [facility], [address], [place], [place] [address]   WBC 09/27/2023 5.4  4.0 - 10.5 K/uL Final   RBC 09/27/2023 4.42  4.22 - 5.81 MIL/uL Final   Hemoglobin 09/27/2023 14.5  13.0 - 17.0 g/dL Final   HCT 08/23/2535 42.1  39.0 - 52.0 % Final   MCV 09/27/2023 95.2  80.0 - 100.0 fL Final   MCH 09/27/2023 32.8  26.0 - 34.0 pg Final   MCHC 09/27/2023 34.4  30.0 - 36.0 g/dL Final   RDW 64/40/3474 13.0  11.5 - 15.5 % Final   Platelets 09/27/2023 163  150 - 400 K/uL Final   nRBC 09/27/2023 0.0  0.0 - 0.2 % Final   Performed at [facility], [address], [place], [place] [address]   Sodium 09/27/2023 136  135 - 145 mmol/L Final   Potassium 09/27/2023 4.4  3.5 - 5.1 mmol/L Final   Chloride 09/27/2023 104  98 - 111  mmol/L Final   CO2 09/27/2023 25  22 - 32 mmol/L Final   Glucose, Bld 09/27/2023 96  70 - 99 mg/dL Final   Glucose reference range applies only to samples taken after fasting for at least 8 hours.   BUN 09/27/2023 11  6 - 20 mg/dL Final   Creatinine, Ser 09/27/2023 0.75  0.61 - 1.24 mg/dL Final   Calcium 40/98/1191 9.3  8.9 - 10.3 mg/dL Final   GFR, Estimated 09/27/2023 >60  >60 mL/min Final   Comment: (NOTE) Calculated using the CKD-EPI Creatinine Equation (2021)    Anion gap 09/27/2023 7  5 - 15 Final   Performed at [facility], [address] [place], [place] [address]   Phosphorus 09/27/2023 4.5  2.5 - 4.6 mg/dL Final   Performed at [facility], [address] [place], [place] [address]   Magnesium 09/27/2023 2.2  1.7 - 2.4 mg/dL Final   Performed at [facility], [address], [place], [place] [address]   Glucose-Capillary 09/26/2023 98  70 - 99 mg/dL Final   Glucose reference range applies only to samples taken after fasting for at least 8 hours.   WBC 09/28/2023 5.6  4.0 - 10.5 K/uL Final   RBC 09/28/2023 4.35  4.22 - 5.81 MIL/uL Final   Hemoglobin 09/28/2023 14.5  13.0 - 17.0 g/dL Final   HCT  84/69/6295 42.1  39.0 - 52.0 % Final   MCV 09/28/2023 96.8  80.0 - 100.0 fL Final   MCH 09/28/2023 33.3  26.0 - 34.0 pg Final   MCHC 09/28/2023 34.4  30.0 - 36.0 g/dL Final   RDW 28/41/3244 12.9  11.5 - 15.5 % Final   Platelets 09/28/2023 182  150 - 400 K/uL Final   nRBC 09/28/2023 0.0  0.0 - 0.2 % Final   Performed at [facility], [address] [place], [place] [address]   Sodium 09/28/2023 135  135 - 145 mmol/L Final   Potassium 09/28/2023 4.2  3.5 - 5.1 mmol/L Final   Chloride 09/28/2023 104  98 - 111 mmol/L Final   CO2 09/28/2023 24  22 - 32 mmol/L Final   Glucose, Bld 09/28/2023 102 (H)  70 - 99 mg/dL Final   Glucose reference range applies only to samples taken after fasting for at least 8 hours.   BUN 09/28/2023 16  6 - 20 mg/dL Final   Creatinine, Ser 09/28/2023 0.75  0.61 - 1.24 mg/dL Final   Calcium 25/36/6440 8.8 (L)  8.9 - 10.3 mg/dL Final   GFR, Estimated 09/28/2023 >60  >60 mL/min Final   Comment: (NOTE) Calculated using the CKD-EPI Creatinine Equation (2021)    Anion gap 09/28/2023 7  5 - 15 Final   Performed at [facility], [address] [place], [place] [address]   Phosphorus 09/28/2023 3.9  2.5 - 4.6 mg/dL Final   Performed at [facility], [address] [place], [place] [address]   Magnesium 09/28/2023 1.9  1.7 - 2.4 mg/dL Final   Performed at [facility], [address] [place], [place] [address]   Total Protein 09/28/2023 6.6  6.5 - 8.1 g/dL Final   Albumin 33/29/5188 3.7  3.5 - 5.0 g/dL Final   AST 41/66/0630 28  15 - 41 U/L Final   ALT 09/28/2023 38  0 - 44 U/L Final   Alkaline Phosphatase 09/28/2023 40  38 - 126 U/L Final   Total Bilirubin  09/28/2023 0.7  <1.2 mg/dL Final   Bilirubin, Direct 09/28/2023 0.1  0.0 - 0.2 mg/dL Final   Indirect Bilirubin 09/28/2023 0.6  0.3 - 0.9 mg/dL Final   Performed at [facility], [address], [place], [place] [address]   Lipase 09/28/2023 84 (H)  11 - 51 U/L Final   Performed at  [facility], [address] [place], [place] [address]    Blood Alcohol level:  Lab Results  Component Value Date   ETH 109 (H) 01/22/2024   ETH 436 (HH) 12/24/2023    Metabolic Disorder Labs: Lab Results  Component Value Date   HGBA1C 5.0 01/22/2024   MPG 96.8 01/22/2024   MPG 108 11/07/2014   No results found for: "PROLACTIN" Lab Results  Component Value Date   CHOL 202 (H) 01/22/2024   TRIG 65 01/22/2024   HDL 84 01/22/2024   CHOLHDL 2.4 01/22/2024   VLDL 13 01/22/2024   LDLCALC 105 (H) 01/22/2024   LDLCALC 137 (H) 01/09/2016    Therapeutic Lab Levels: No results found for: "LITHIUM" No results found for: "VALPROATE" No results found for: "CBMZ"  Physical Findings   AUDIT    Flowsheet Row ED from 01/23/2024 in [facility]  Alcohol Use Disorder Identification Test Final Score (AUDIT) 36      GAD-7    Flowsheet Row Counselor from 10/22/2023 in [facility]  Total GAD-7 Score 8      PHQ2-9    Flowsheet Row ED from 01/23/2024 in [facility] ED from 01/22/2024 in [facility] Counselor from 10/22/2023 in [facility] Office Visit from 08/22/2016 in [facility] Visit from 01/09/2016 in [facility]  PHQ-2 Total Score 3 4 0 0 0  PHQ-9 Total Score 14 12 -- -- --      Flowsheet Row ED from 01/23/2024 in [facility] ED from 01/22/2024 in [facility] ED from 12/24/2023 in [facility] Emergency Department at [facility]  C-SSRS RISK CATEGORY Low Risk Error: Q3, 4, or 5 should not be populated when Q2 is No No Risk        Musculoskeletal  Strength & Muscle Tone: within normal limits Gait & Station: normal Patient leans: N/A  Psychiatric Specialty Exam   Presentation  General Appearance:  Appropriate for Environment; Fairly Groomed (on edge and tremulous, engaged and polite)  Eye Contact: Good  Speech: Clear and Coherent; Normal Rate (spontaneous)  Speech Volume: Normal  Handedness: Right   Mood and Affect  Mood: Anxious (not as bad as it was. mainly feeling somatic sxs rather than cognitive ruminations)  Affect: Appropriate; Congruent; Full Range (anxious appearing)   Thought Process  Thought Processes: Coherent; Goal Directed; Linear  Descriptions of Associations:Intact  Orientation:Full (Time, Place and Person)  Thought Content:WDL; Logical  Diagnosis of Schizophrenia or Schizoaffective disorder in past: No    Hallucinations:Hallucinations: None   Ideas of Reference:None  Suicidal Thoughts:Suicidal Thoughts: No   Homicidal Thoughts:Homicidal Thoughts: No    Sensorium  Memory: Immediate Good  Judgment: Good  Insight: Fair   Executive Functions  Concentration: Good  Attention Span: Good  Recall: Good  Fund of Knowledge: Good  Language: Good   Psychomotor Activity  Psychomotor Activity: Psychomotor Activity: Normal    Assets  Assets: Communication Skills; Desire for Improvement;  Resilience; Leisure Time; Physical Health   Sleep  Sleep: Sleep: Fair  Physical Exam  Physical Exam Vitals reviewed.  Constitutional:      General: He is not in acute distress.    Appearance: Normal appearance. He is not diaphoretic.     Comments: Somewhat tremulous  HENT:     Head: Normocephalic and atraumatic.     Nose: No congestion.  Eyes:     Conjunctiva/sclera: Conjunctivae normal.  Pulmonary:     Effort: Pulmonary effort is normal. No respiratory distress.  Skin:    Findings: Lesion present.       Neurological:     General: No focal deficit present.     Mental Status: He is alert and oriented to person, place, and time.     Gait: Gait normal.    Review of Systems   Constitutional:  Negative for chills and fever.  Respiratory:  Negative for shortness of breath.   Cardiovascular:  Negative for chest pain and palpitations.  Gastrointestinal:  Negative for nausea and vomiting.  Neurological:  Positive for tremors. Negative for headaches.   Blood pressure 107/76, pulse 63, temperature 97.9 F (36.6 C), temperature source Oral, resp. rate 19, SpO2 100%. There is no height or weight on file to calculate BMI.  Treatment Plan Summary: Daily contact with patient to assess and evaluate symptoms and progress in treatment and Medication management   FRISCO CORDTS is a 43 y.o. male with AUD with complicated w/d (DTs/sz), MDD, GAD, no inpatient psych admission or suicide attempt, chronic hep C, recurrent HSV here at [facility] for etoh detox.  Today, he looks like he is still withdrawing some, looks tremulous and is anxious, but otherwise not bad. He's been gettingSCH gabapentin and PRN vistaril for it, which has helped some. But his anxiety is more somatic, less cognitive so will add propranolol PRN and maybe back off of vistaril if he finds it helpful.  Starting him on naltrexone, h/o transaminitis, so repeating CMP to trend, last CMP was last month.  MDD, Recurrent, Moderate  GAD: -Continue Zoloft 50 mg daily for depression and anxiety (new med) -STARTED propranolol IR 10 mg PRN q8hrs (new med) -Continue Agitation Protocol: Zyprexa   Alcohol Withdrawal: AST 44,ALT 46; EtOH 109 -STARTED naltrexone 50 mg qPM (new med) -repeat CMP -Continued gabapentin 200 mg TID (new med) -Continued trazodone 50 mg qPM PRN -Continue CIWA, last score= 0 -Completed Ativan taper 4/1 -Continue Ativan 1 mg q6 PRN CIWA>10 -Continue Imodium 2-4 mg PRN diarrhea -Continue Zofran-ODT 4 mg q6 PRN nausea -Continue Thiamine 100 mg daily for nutritional supplementation -Continue Multivitamin daily for nutritional supplementation   -Continue PRN's: Tylenol, Maalox, Atarax, Milk of  Magnesia, Trazodone   HSV -Per ID recommendation, start valacyclovir 1000mg  bid for 10 total days - last dose on 4/13   Dispo: [facility] 4/8 - wants dad to pick him up and take him to [employer], DO 01/30/2024 1:51 PM

## 2024-01-30 NOTE — ED Notes (Signed)
 Pt calm and sleeping in the bedroom. NAD. Respirations even and unlabored. Will monitor for safety.

## 2024-01-30 NOTE — ED Notes (Signed)
 PRN Atrax given due to patient reports of anxiety "5 or 6" out of 10. Medication administered with no complications. Environment secured, safety checks in place per facility policy.

## 2024-01-30 NOTE — ED Notes (Signed)
 Patient sitting in dayroom interacting with peers. No acute distress noted. No concerns voiced. Informed patient to notify staff with any needs or assistance. Patient verbalized understanding or agreement. Safety checks in place per facility policy.

## 2024-01-30 NOTE — Group Note (Signed)
 Group Topic: Relapse and Recovery  Group Date: 01/30/2024 Start Time: 2005 End Time: 2100 Facilitators: Rae Lips B  Department: [facility]  Number of Participants: 4  Group Focus: abuse issues, acceptance, and activities of daily living skills Treatment Modality:  Leisure Development Interventions utilized were leisure development Purpose: relapse prevention strategies  Name: Trevor [name] Date of Birth: [birth year]  MR: [id number]    Level of Participation: active Quality of Participation: attentive, cooperative, initiates communication, motivated, and offered feedback Interactions with others: gave feedback Mood/Affect: appropriate Triggers (if applicable): NA Cognition: coherent/clear Progress: Gaining insight Response: NA Plan: patient will be encouraged to keep going to groups.   Patients Problems:  Patient Active Problem List   Diagnosis Date Noted   Moderate episode of recurrent major depressive disorder (HCC) 01/30/2024   GAD (generalized anxiety disorder) 01/30/2024   Transaminitis 06/18/2022   GERD (gastroesophageal reflux disease) 06/18/2022   Hypokalemia 06/18/2022   Chronic hepatitis C without hepatic coma (HCC) 06/28/2019   Psoriasis 06/07/2019   H/O withdrawal symptoms, alcohol, with delirium and seizures (HCC) 06/07/2019   Alcohol use disorder, severe, dependence (HCC) 06/03/2019   Obesity (BMI 30-39.9) 01/09/2016   HSV-2 (herpes simplex virus 2) infection

## 2024-01-30 NOTE — ED Notes (Signed)
 PRN Atarax given due to patient reports of anxiety rating 6/10. Medication administered with no complications. Environment secured, safety checks in place per facility policy.

## 2024-01-30 NOTE — ED Notes (Signed)
 Patient is sleeping. Respirations equal and unlabored, skin warm and dry. No change in assessment or acuity. Routine safety checks conducted according to facility protocol. Will continue to monitor for safety.

## 2024-01-30 NOTE — Group Note (Signed)
 Group Topic: Balance in Life  Group Date: 01/30/2024 Start Time: 1700 End Time: 1730 Facilitators: Concha Norway, NT  Department: [facility]  Number of Participants: 3  Group Focus: acceptance Treatment Modality:  Individual Therapy and Patient-Centered Therapy Interventions utilized were clarification and exploration Purpose: express feelings  Name: Trevor [name] Date of Birth: [birth year]  MR: [id number]    Level of Participation: minimal Quality of Participation: attention seeking Interactions with others: gave feedback Mood/Affect: anxious Triggers (if applicable):   Cognition: concrete Progress: Moderate Response:   Plan: follow-up needed  Patients Problems:  Patient Active Problem List   Diagnosis Date Noted   Moderate episode of recurrent major depressive disorder (HCC) 01/30/2024   GAD (generalized anxiety disorder) 01/30/2024   Transaminitis 06/18/2022   GERD (gastroesophageal reflux disease) 06/18/2022   Hypokalemia 06/18/2022   Chronic hepatitis C without hepatic coma (HCC) 06/28/2019   Psoriasis 06/07/2019   H/O withdrawal symptoms, alcohol, with delirium and seizures (HCC) 06/07/2019   Alcohol use disorder, severe, dependence (HCC) 06/03/2019   Obesity (BMI 30-39.9) 01/09/2016   HSV-2 (herpes simplex virus 2) infection

## 2024-01-30 NOTE — ED Notes (Addendum)
 Patient in the dayroom with other patients watching TV. Calm and pleasant. Respirations even and unlabored. Evening snacks given. Environtal checked, secured and safe per per policy. Will monitor for safety.

## 2024-01-30 NOTE — ED Notes (Signed)
 Patient sitting in dayroom calm and collected. No acute distress noted. No concerns voiced. Informed patient to notify staff with any needs or assistance. Patient verbalized understanding or agreement. Safety checks in place per facility policy.

## 2024-01-31 DIAGNOSIS — F411 Generalized anxiety disorder: Secondary | ICD-10-CM | POA: Diagnosis not present

## 2024-01-31 DIAGNOSIS — A6002 Herpesviral infection of other male genital organs: Secondary | ICD-10-CM | POA: Diagnosis not present

## 2024-01-31 DIAGNOSIS — F331 Major depressive disorder, recurrent, moderate: Secondary | ICD-10-CM | POA: Diagnosis not present

## 2024-01-31 DIAGNOSIS — F1020 Alcohol dependence, uncomplicated: Secondary | ICD-10-CM | POA: Diagnosis not present

## 2024-01-31 MED ORDER — ADULT MULTIVITAMIN W/MINERALS CH
1.0000 | ORAL_TABLET | ORAL | Status: DC
Start: 2024-02-01 — End: 2024-02-02
  Administered 2024-02-01 – 2024-02-02 (×2): 1 via ORAL
  Filled 2024-01-31: qty 1
  Filled 2024-01-31: qty 14
  Filled 2024-01-31: qty 1

## 2024-01-31 MED ORDER — TRAZODONE HCL 50 MG PO TABS
50.0000 mg | ORAL_TABLET | Freq: Every day | ORAL | Status: DC
  Administered 2024-01-31 – 2024-02-01 (×2): 50 mg via ORAL
  Filled 2024-01-31: qty 1
  Filled 2024-01-31: qty 14
  Filled 2024-01-31: qty 1

## 2024-01-31 MED ORDER — THIAMINE MONONITRATE 100 MG PO TABS
100.0000 mg | ORAL_TABLET | ORAL | Status: DC
  Administered 2024-02-01 – 2024-02-02 (×2): 100 mg via ORAL
  Filled 2024-01-31 (×2): qty 1

## 2024-01-31 NOTE — ED Provider Notes (Signed)
 Behavioral Health Progress Note  Date and Time: 01/31/2024 1:49 PM Name: Trevor [name] MRN:  [id number]  Subjective:  Trevor [name]. Obst is a 43 yr old male who presented on 3/28 to [facility] requesting help for EtOH Abuse, he was admitted to [facility] on 3/29 for alcohol detox and rehab services. PPHx is significant for MDD, GAD, AUD (ho sz and DTs), and has been to Residential Rehab 4 times, and no history of Suicide Attempts, Self Injurious Behavior, or Psychiatric Hospitalizations.   Denied cravings, tremors are better, but they are still coming and going. Denied side effects to naltrexone. Hasn't tried propranolol prn yet for his tremors.  H/o hep c in chart, has gotten it treated in ~2021 here in [place].   Mood:Anxious (still present, requiring PRN vistaril, but continues to improve) Sleep:Fair (with trazodone, requested that it be scheduled instead of PRN here) Appetite: Good SI:No HI:No WUJ:WJXB Delusions:None  Review of Systems  Constitutional:  Negative for malaise/fatigue.  Respiratory:  Negative for shortness of breath.   Cardiovascular:  Negative for chest pain.  Gastrointestinal:  Negative for abdominal pain, constipation, diarrhea, nausea and vomiting.  Neurological:  Negative for dizziness, tremors, weakness and headaches.    Diagnosis:  Final diagnoses:  Moderate episode of recurrent major depressive disorder (HCC)  GAD (generalized anxiety disorder)  Alcohol use disorder, severe, dependence (HCC)  HSV-2 (herpes simplex virus 2) infection  H/O withdrawal symptoms, alcohol, with delirium and seizures (HCC)  Chronic hepatitis C without hepatic coma (HCC)   Total Time spent with patient: 20 minutes  Past Psychiatric History:  Dx: AUD, MDD, GAD Residential Rehab 4 times,  no history of Suicide Attempts, Self Injurious Behavior, or Psychiatric Hospitalizations.  Past rx: wellbutrin (~2017-2020, lexapro adjunct) lexapro (maxed 2016), trazodone, vivitrol Outpatient tx: No  psychiatrist Therapy - [name], LCAS at [facility]    Past Medical History:  EtOH withdrawal seizures and DT (see 06/23/2022 dc summary), required phenobarbital and precedex Prolonged Qtc Alcoholic hepatitis Chronic hep C HSV   Family History:  Mother- Skin Cancer Paternal Grandfather- Colon Cancer Paternal Grandmother and Grandfather- Lung Cancer   Family Psychiatric  History:  Brother- EtOH Abuse Maternal Grandfather and Grandmother- EtOH Abuse No Known Diagnosis' or Suicides   Social History:  Divorced, has a daughter. Recently fired from his job. Started drinking at age 31, longest period of sobriety 6 months, been to Residential Rehab 4 times.    Current Medications:  Current Facility-Administered Medications  Medication Dose Route Frequency Provider Last Rate Last Admin   acetaminophen (TYLENOL) tablet 650 mg  650 mg Oral Q6H PRN Marlou Sa, NP       alum & mag hydroxide-simeth (MAALOX/MYLANTA) 200-200-20 MG/5ML suspension 30 mL  30 mL Oral Q4H PRN Rayburn Go, Veronique M, NP       gabapentin (NEURONTIN) capsule 200 mg  200 mg Oral TID Miguel Rota, MD   200 mg at 01/31/24 0939   hydrOXYzine (ATARAX) tablet 25 mg  25 mg Oral Q6H PRN Miguel Rota, MD   25 mg at 01/30/24 1817   magnesium hydroxide (MILK OF MAGNESIA) suspension 30 mL  30 mL Oral Daily PRN Marlou Sa, NP   30 mL at 01/25/24 1309   [START ON 02/01/2024] multivitamin with minerals tablet 1 tablet  1 tablet Oral Mal Amabile, DO       naltrexone (DEPADE) tablet 50 mg  50 mg Oral QHS Princess Bruins, DO   50 mg at 01/30/24 2107  OLANZapine (ZYPREXA) injection 10 mg  10 mg Intramuscular TID PRN Marlou Sa, NP       OLANZapine (ZYPREXA) injection 5 mg  5 mg Intramuscular TID PRN Rayburn Go, Veronique M, NP       OLANZapine zydis (ZYPREXA) disintegrating tablet 5 mg  5 mg Oral TID PRN Marlou Sa, NP       propranolol (INDERAL) tablet 10 mg  10 mg Oral Q8H PRN Princess Bruins, DO   10 mg at 01/31/24 0944   sertraline (ZOLOFT) tablet 50 mg  50 mg Oral Mignon Pine, Raynelle Fanning, DO   50 mg at 01/31/24 0636   [START ON 02/01/2024] thiamine (VITAMIN B1) tablet 100 mg  100 mg Oral Mal Amabile, DO       traZODone (DESYREL) tablet 50 mg  50 mg Oral QHS Princess Bruins, DO       valACYclovir (VALTREX) tablet 1,000 mg  1,000 mg Oral BID Kizzie Ide B, MD   1,000 mg at 01/31/24 1610   Current Outpatient Medications  Medication Sig Dispense Refill   famotidine (PEPCID) 20 MG tablet Take 20 mg by mouth 2 (two) times daily. (Patient not taking: Reported on 01/22/2024)     hydrOXYzine (ATARAX) 50 MG tablet Take 50 mg by mouth every 8 (eight) hours as needed for anxiety. (Patient not taking: Reported on 01/22/2024)     Multiple Vitamin (MULTIVITAMIN WITH MINERALS) TABS tablet Take 1 tablet by mouth daily.     thiamine (VITAMIN B1) 100 MG tablet Take 1 tablet (100 mg total) by mouth daily. (Patient not taking: Reported on 01/22/2024) 30 tablet 0    Labs  Lab Results:  Admission on 01/23/2024  Component Date Value Ref Range Status   Sodium 01/30/2024 137  135 - 145 mmol/L Final   Potassium 01/30/2024 4.1  3.5 - 5.1 mmol/L Final   Chloride 01/30/2024 101  98 - 111 mmol/L Final   CO2 01/30/2024 30  22 - 32 mmol/L Final   Glucose, Bld 01/30/2024 92  70 - 99 mg/dL Final   Glucose reference range applies only to samples taken after fasting for at least 8 hours.   BUN 01/30/2024 17  6 - 20 mg/dL Final   Creatinine, Ser 01/30/2024 1.03  0.61 - 1.24 mg/dL Final   Calcium 96/01/5408 8.9  8.9 - 10.3 mg/dL Final   Total Protein 81/19/1478 7.2  6.5 - 8.1 g/dL Final   Albumin 29/56/2130 4.1  3.5 - 5.0 g/dL Final   AST 86/57/8469 18  15 - 41 U/L Final   ALT 01/30/2024 19  0 - 44 U/L Final   Alkaline Phosphatase 01/30/2024 43  38 - 126 U/L Final   Total Bilirubin 01/30/2024 0.6  0.0 - 1.2 mg/dL Final   GFR, Estimated 01/30/2024 >60  >60 mL/min Final   Comment: (NOTE) Calculated  using the CKD-EPI Creatinine Equation (2021)    Anion gap 01/30/2024 6  5 - 15 Final   Performed at [facility], [address], [place], [place] [address]  Admission on 01/22/2024, Discharged on 01/23/2024  Component Date Value Ref Range Status   WBC 01/22/2024 6.8  4.0 - 10.5 K/uL Final   RBC 01/22/2024 5.22  4.22 - 5.81 MIL/uL Final   Hemoglobin 01/22/2024 17.2 (H)  13.0 - 17.0 g/dL Final   HCT 84/13/2440 47.8  39.0 - 52.0 % Final   MCV 01/22/2024 91.6  80.0 - 100.0 fL Final   MCH 01/22/2024 33.0  26.0 -  34.0 pg Final   MCHC 01/22/2024 36.0  30.0 - 36.0 g/dL Final   RDW 16/07/9603 12.1  11.5 - 15.5 % Final   Platelets 01/22/2024 319  150 - 400 K/uL Final   nRBC 01/22/2024 0.0  0.0 - 0.2 % Final   Neutrophils Relative % 01/22/2024 70  % Final   Neutro Abs 01/22/2024 4.8  1.7 - 7.7 K/uL Final   Lymphocytes Relative 01/22/2024 25  % Final   Lymphs Abs 01/22/2024 1.7  0.7 - 4.0 K/uL Final   Monocytes Relative 01/22/2024 4  % Final   Monocytes Absolute 01/22/2024 0.3  0.1 - 1.0 K/uL Final   Eosinophils Relative 01/22/2024 0  % Final   Eosinophils Absolute 01/22/2024 0.0  0.0 - 0.5 K/uL Final   Basophils Relative 01/22/2024 1  % Final   Basophils Absolute 01/22/2024 0.1  0.0 - 0.1 K/uL Final   Immature Granulocytes 01/22/2024 0  % Final   Abs Immature Granulocytes 01/22/2024 0.02  0.00 - 0.07 K/uL Final   Performed at [facility], [address] [place], [place] [address]   Sodium 01/22/2024 138  135 - 145 mmol/L Final   Potassium 01/22/2024 3.9  3.5 - 5.1 mmol/L Final   Chloride 01/22/2024 99  98 - 111 mmol/L Final   CO2 01/22/2024 25  22 - 32 mmol/L Final   Glucose, Bld 01/22/2024 99  70 - 99 mg/dL Final   Glucose reference range applies only to samples taken after fasting for at least 8 hours.   BUN 01/22/2024 13  6 - 20 mg/dL Final   Creatinine, Ser 01/22/2024 0.83  0.61 - 1.24 mg/dL Final   Calcium 11/91/4782 8.9  8.9 - 10.3 mg/dL Final   Total Protein  01/22/2024 7.5  6.5 - 8.1 g/dL Final   Albumin 95/62/1308 4.6  3.5 - 5.0 g/dL Final   AST 65/78/4696 44 (H)  15 - 41 U/L Final   ALT 01/22/2024 46 (H)  0 - 44 U/L Final   Alkaline Phosphatase 01/22/2024 60  38 - 126 U/L Final   Total Bilirubin 01/22/2024 0.8  0.0 - 1.2 mg/dL Final   GFR, Estimated 01/22/2024 >60  >60 mL/min Final   Comment: (NOTE) Calculated using the CKD-EPI Creatinine Equation (2021)    Anion gap 01/22/2024 14  5 - 15 Final   Performed at [facility], [address], [place], [place] [address]   Hgb A1c MFr Bld 01/22/2024 5.0  4.8 - 5.6 % Final   Comment: (NOTE) Pre diabetes:          5.7%-6.4%  Diabetes:              >6.4%  Glycemic control for   <7.0% adults with diabetes    Mean Plasma Glucose 01/22/2024 96.8  mg/dL Final   Performed at [facility], [address] [place], [place] [address]   Magnesium 01/22/2024 1.9  1.7 - 2.4 mg/dL Final   Performed at [facility], [address], [place], [place] [address]   Alcohol, Ethyl (B) 01/22/2024 109 (H)  <10 mg/dL Final   Comment: (NOTE) Lowest detectable limit for serum alcohol is 10 mg/dL.  For medical purposes only. Performed at [facility], [address], [place], [place] [address]    Cholesterol 01/22/2024 202 (H)  0 - 200 mg/dL Final   Triglycerides 64/40/3474 65  <150 mg/dL Final   HDL 25/95/6387 84  >40 mg/dL Final  Total CHOL/HDL Ratio 01/22/2024 2.4  RATIO Final   VLDL 01/22/2024 13  0 - 40 mg/dL Final   LDL Cholesterol 01/22/2024 105 (H)  0 - 99 mg/dL Final   Comment:        Total Cholesterol/HDL:CHD Risk Coronary Heart Disease Risk Table                     Men   Women  1/2 Average Risk   3.4   3.3  Average Risk       5.0   4.4  2 X Average Risk   9.6   7.1  3 X Average Risk  23.4   11.0        Use the calculated Patient Ratio above and the CHD Risk Table to determine the patient's CHD Risk.        ATP III CLASSIFICATION (LDL):  <100     mg/dL   Optimal   562-130  mg/dL   Near or Above                    Optimal  130-159  mg/dL   Borderline  865-784  mg/dL   High  >696     mg/dL   Very High Performed at [facility], [address], [place], [place] [address]    TSH 01/22/2024 0.458  0.350 - 4.500 uIU/mL Final   Comment: Performed by a 3rd Generation assay with a functional sensitivity of <=0.01 uIU/mL. Performed at [facility], [address], [place], [place] [address]    RPR Ser Ql 01/22/2024 NON REACTIVE  NON REACTIVE Final   Performed at [facility], [address] [place], [place] [address]   Color, Urine 01/22/2024 YELLOW  YELLOW Final   APPearance 01/22/2024 CLEAR  CLEAR Final   Specific Gravity, Urine 01/22/2024 1.020  1.005 - 1.030 Final   pH 01/22/2024 5.0  5.0 - 8.0 Final   Glucose, UA 01/22/2024 NEGATIVE  NEGATIVE mg/dL Final   Hgb urine dipstick 01/22/2024 NEGATIVE  NEGATIVE Final   Bilirubin Urine 01/22/2024 NEGATIVE  NEGATIVE Final   Ketones, ur 01/22/2024 5 (A)  NEGATIVE mg/dL Final   Protein, ur 72/53/6644 NEGATIVE  NEGATIVE mg/dL Final   Nitrite 03/47/4259 NEGATIVE  NEGATIVE Final   Leukocytes,Ua 01/22/2024 NEGATIVE  NEGATIVE Final   Performed at [facility], [address] [place], [place] [address]   POC Amphetamine UR 01/22/2024 None Detected   Final   POC Secobarbital (BAR) 01/22/2024 None Detected   Final   POC Buprenorphine (BUP) 01/22/2024 None Detected   Final   POC Oxazepam (BZO) 01/22/2024 None Detected   Final   POC Cocaine UR 01/22/2024 None Detected   Final   POC Methamphetamine UR 01/22/2024 None Detected   Final   POC Morphine 01/22/2024 None Detected   Final   POC Methadone UR 01/22/2024 None Detected   Final   POC Oxycodone UR 01/22/2024 None Detected   Final   POC Marijuana UR 01/22/2024 None Detected   Final  Admission on 12/24/2023, Discharged on 12/25/2023  Component Date Value Ref Range Status   Sodium 12/24/2023 144  135 - 145 mmol/L Final   Potassium  12/24/2023 3.8  3.5 - 5.1 mmol/L Final   Chloride 12/24/2023 107  98 - 111 mmol/L Final   CO2 12/24/2023 26  22 - 32 mmol/L Final   Glucose, Bld 12/24/2023 96  70 - 99 mg/dL Final  Glucose reference range applies only to samples taken after fasting for at least 8 hours.   BUN 12/24/2023 11  6 - 20 mg/dL Final   Creatinine, Ser 12/24/2023 0.62  0.61 - 1.24 mg/dL Final   Calcium 16/07/9603 8.6 (L)  8.9 - 10.3 mg/dL Final   Total Protein 54/06/8118 7.8  6.5 - 8.1 g/dL Final   Albumin 14/78/2956 4.3  3.5 - 5.0 g/dL Final   AST 21/30/8657 34  15 - 41 U/L Final   ALT 12/24/2023 27  0 - 44 U/L Final   Alkaline Phosphatase 12/24/2023 56  38 - 126 U/L Final   Total Bilirubin 12/24/2023 0.4  0.0 - 1.2 mg/dL Final   GFR, Estimated 12/24/2023 >60  >60 mL/min Final   Comment: (NOTE) Calculated using the CKD-EPI Creatinine Equation (2021)    Anion gap 12/24/2023 11  5 - 15 Final   Performed at [facility], [address] [place], [place] [address]   Alcohol, Ethyl (B) 12/24/2023 436 (HH)  <10 mg/dL Final   Comment: CRITICAL RESULT CALLED TO, READ BACK BY AND VERIFIED WITH Barnett Applebaum, RN 12/25/23 0050 BY K. DAVIS (NOTE) Lowest detectable limit for serum alcohol is 10 mg/dL.  For medical purposes only. Performed at [facility], [address], [place], [place] [address]    Opiates 12/25/2023 NONE DETECTED  NONE DETECTED Final   Cocaine 12/25/2023 NONE DETECTED  NONE DETECTED Final   Benzodiazepines 12/25/2023 NONE DETECTED  NONE DETECTED Final   Amphetamines 12/25/2023 NONE DETECTED  NONE DETECTED Final   Tetrahydrocannabinol 12/25/2023 NONE DETECTED  NONE DETECTED Final   Barbiturates 12/25/2023 NONE DETECTED  NONE DETECTED Final   Comment: (NOTE) DRUG SCREEN FOR MEDICAL PURPOSES ONLY.  IF CONFIRMATION IS NEEDED FOR ANY PURPOSE, NOTIFY LAB WITHIN 5 DAYS.  LOWEST DETECTABLE LIMITS FOR URINE DRUG SCREEN Drug Class                     Cutoff  (ng/mL) Amphetamine and metabolites    1000 Barbiturate and metabolites    200 Benzodiazepine                 200 Opiates and metabolites        300 Cocaine and metabolites        300 THC                            50 Performed at [facility], [address], [place], [place] [address]    WBC 12/24/2023 5.1  4.0 - 10.5 K/uL Final   RBC 12/24/2023 4.78  4.22 - 5.81 MIL/uL Final   Hemoglobin 12/24/2023 16.0  13.0 - 17.0 g/dL Final   HCT 40/07/2724 45.7  39.0 - 52.0 % Final   MCV 12/24/2023 95.6  80.0 - 100.0 fL Final   MCH 12/24/2023 33.5  26.0 - 34.0 pg Final   MCHC 12/24/2023 35.0  30.0 - 36.0 g/dL Final   RDW 36/64/4034 12.8  11.5 - 15.5 % Final   Platelets 12/24/2023 292  150 - 400 K/uL Final   nRBC 12/24/2023 0.0  0.0 - 0.2 % Final   Neutrophils Relative % 12/24/2023 61  % Final   Neutro Abs 12/24/2023 3.0  1.7 - 7.7 K/uL Final   Lymphocytes Relative 12/24/2023 31  % Final   Lymphs Abs 12/24/2023 1.6  0.7 - 4.0 K/uL Final   Monocytes Relative 12/24/2023  8  % Final   Monocytes Absolute 12/24/2023 0.4  0.1 - 1.0 K/uL Final   Eosinophils Relative 12/24/2023 0  % Final   Eosinophils Absolute 12/24/2023 0.0  0.0 - 0.5 K/uL Final   Basophils Relative 12/24/2023 0  % Final   Basophils Absolute 12/24/2023 0.0  0.0 - 0.1 K/uL Final   Immature Granulocytes 12/24/2023 0  % Final   Abs Immature Granulocytes 12/24/2023 0.02  0.00 - 0.07 K/uL Final   Performed at [facility], [address] [place], [place] [address]   Lipase 12/24/2023 41  11 - 51 U/L Final   Performed at [facility], [address], [place], [place] [address]   Color, Urine 12/25/2023 YELLOW  YELLOW Final   APPearance 12/25/2023 HAZY (A)  CLEAR Final   Specific Gravity, Urine 12/25/2023 1.010  1.005 - 1.030 Final   pH 12/25/2023 7.0  5.0 - 8.0 Final   Glucose, UA 12/25/2023 NEGATIVE  NEGATIVE mg/dL Final   Hgb urine dipstick 12/25/2023 NEGATIVE  NEGATIVE  Final   Bilirubin Urine 12/25/2023 NEGATIVE  NEGATIVE Final   Ketones, ur 12/25/2023 NEGATIVE  NEGATIVE mg/dL Final   Protein, ur 43/32/9518 NEGATIVE  NEGATIVE mg/dL Final   Nitrite 84/16/6063 NEGATIVE  NEGATIVE Final   Leukocytes,Ua 12/25/2023 NEGATIVE  NEGATIVE Final   Performed at [facility], [address] [place], [place] [address]  Admission on 09/23/2023, Discharged on 09/29/2023  Component Date Value Ref Range Status   WBC 09/23/2023 5.2  4.0 - 10.5 K/uL Final   RBC 09/23/2023 5.10  4.22 - 5.81 MIL/uL Final   Hemoglobin 09/23/2023 16.9  13.0 - 17.0 g/dL Final   HCT 09/32/3557 46.5  39.0 - 52.0 % Final   MCV 09/23/2023 91.2  80.0 - 100.0 fL Final   MCH 09/23/2023 33.1  26.0 - 34.0 pg Final   MCHC 09/23/2023 36.3 (H)  30.0 - 36.0 g/dL Final   RDW 32/20/2542 12.9  11.5 - 15.5 % Final   Platelets 09/23/2023 185  150 - 400 K/uL Final   nRBC 09/23/2023 0.0  0.0 - 0.2 % Final   Performed at [facility], [address] [place], [place] [address]   Sodium 09/23/2023 139  135 - 145 mmol/L Final   Potassium 09/23/2023 3.5  3.5 - 5.1 mmol/L Final   Chloride 09/23/2023 99  98 - 111 mmol/L Final   CO2 09/23/2023 22  22 - 32 mmol/L Final   Glucose, Bld 09/23/2023 169 (H)  70 - 99 mg/dL Final   Glucose reference range applies only to samples taken after fasting for at least 8 hours.   BUN 09/23/2023 6  6 - 20 mg/dL Final   Creatinine, Ser 09/23/2023 0.71  0.61 - 1.24 mg/dL Final   Calcium 76/28/3151 8.4 (L)  8.9 - 10.3 mg/dL Final   Total Protein 76/16/0737 6.9  6.5 - 8.1 g/dL Final   Albumin 10/62/6948 4.0  3.5 - 5.0 g/dL Final   AST 54/62/7035 135 (H)  15 - 41 U/L Final   ALT 09/23/2023 76 (H)  0 - 44 U/L Final   Alkaline Phosphatase 09/23/2023 58  38 - 126 U/L Final   Total Bilirubin 09/23/2023 0.9  <1.2 mg/dL Final   GFR, Estimated 09/23/2023 >60  >60 mL/min Final   Comment: (NOTE) Calculated using the CKD-EPI Creatinine Equation (2021)    Anion gap  09/23/2023 18 (H)  5 - 15 Final   Performed at [facility], [address], [place], [place] [address]   Troponin I (High Sensitivity) 09/23/2023 14  <18 ng/L Final   Comment: (NOTE) Elevated high sensitivity troponin I (hsTnI) values and significant  changes across serial measurements may suggest ACS but many other  chronic and acute conditions are known to elevate hsTnI results.  Refer to the "Links" section for chest pain algorithms and additional  guidance. Performed at [facility], [address] [place], [place] [address]    Alcohol, Ethyl (B) 09/23/2023 412 (HH)  <10 mg/dL Final   Comment: CRITICAL RESULT CALLED TO, READ BACK BY AND VERIFIED WITH B. OSORIO, RN AT [phone] 11.27.24 JLASIGAN (NOTE) Lowest detectable limit for serum alcohol is 10 mg/dL.  For medical purposes only. Performed at [facility], [address], [place], [place] [address]    Lipase 09/23/2023 86 (H)  11 - 51 U/L Final   Performed at [facility], [address] [place], [place] [address]   Troponin I (High Sensitivity) 09/23/2023 19 (H)  <18 ng/L Final   Comment: (NOTE) Elevated high sensitivity troponin I (hsTnI) values and significant  changes across serial measurements may suggest ACS but many other  chronic and acute conditions are known to elevate hsTnI results.  Refer to the "Links" section for chest pain algorithms and additional  guidance. Performed at [facility], [address] [place], [place] [address]    Magnesium 09/23/2023 1.8  1.7 - 2.4 mg/dL Final   Performed at [facility], [address] [place], [place] [address]   Phosphorus 09/23/2023 2.2 (L)  2.5 - 4.6 mg/dL Final   Performed at [facility], [address], [place], [place] [address]   Opiates 09/23/2023 NONE DETECTED  NONE DETECTED Final   Cocaine 09/23/2023 NONE DETECTED  NONE DETECTED Final   Benzodiazepines 09/23/2023 NONE DETECTED  NONE DETECTED Final   Amphetamines  09/23/2023 NONE DETECTED  NONE DETECTED Final   Tetrahydrocannabinol 09/23/2023 NONE DETECTED  NONE DETECTED Final   Barbiturates 09/23/2023 NONE DETECTED  NONE DETECTED Final   Comment: (NOTE) DRUG SCREEN FOR MEDICAL PURPOSES ONLY.  IF CONFIRMATION IS NEEDED FOR ANY PURPOSE, NOTIFY LAB WITHIN 5 DAYS.  LOWEST DETECTABLE LIMITS FOR URINE DRUG SCREEN Drug Class                     Cutoff (ng/mL) Amphetamine and metabolites    1000 Barbiturate and metabolites    200 Benzodiazepine                 200 Opiates and metabolites        300 Cocaine and metabolites        300 THC                            50 Performed at [facility], [address], [place], [place] [address]    HIV Screen 4th Generation wRfx 09/23/2023 Non Reactive  Non Reactive Final   Performed at [facility], [address], [place], [place] [address]   Phosphorus 09/24/2023 3.1  2.5 - 4.6 mg/dL Final   Performed at [facility], [address], [place], [place] [address]   Lipase 09/24/2023 92 (H)  11 - 51 U/L Final   Performed at [facility], [address] [place], [place] [address]   Sodium  09/24/2023 130 (L)  135 - 145 mmol/L Final   DELTA CHECK NOTED   Potassium 09/24/2023 3.8  3.5 - 5.1 mmol/L Final   Chloride 09/24/2023 98  98 - 111 mmol/L Final   CO2 09/24/2023 24  22 - 32 mmol/L Final   Glucose, Bld 09/24/2023 94  70 - 99 mg/dL Final   Glucose reference range applies only to samples taken after fasting for at least 8 hours.   BUN 09/24/2023 9  6 - 20 mg/dL Final   Creatinine, Ser 09/24/2023 0.63  0.61 - 1.24 mg/dL Final   Calcium 40/98/1191 8.5 (L)  8.9 - 10.3 mg/dL Final   Total Protein 47/82/9562 5.6 (L)  6.5 - 8.1 g/dL Final   Albumin 13/05/6577 3.2 (L)  3.5 - 5.0 g/dL Final   AST 46/96/2952 78 (H)  15 - 41 U/L Final   ALT 09/24/2023 54 (H)  0 - 44 U/L Final   Alkaline Phosphatase 09/24/2023 42  38 - 126 U/L Final   Total Bilirubin 09/24/2023 1.2 (H)  <1.2 mg/dL Final    GFR, Estimated 09/24/2023 >60  >60 mL/min Final   Comment: (NOTE) Calculated using the CKD-EPI Creatinine Equation (2021)    Anion gap 09/24/2023 8  5 - 15 Final   Performed at [facility], [address] [place], [place] [address]   WBC 09/24/2023 4.1  4.0 - 10.5 K/uL Final   RBC 09/24/2023 3.97 (L)  4.22 - 5.81 MIL/uL Final   Hemoglobin 09/24/2023 13.2  13.0 - 17.0 g/dL Final   HCT 44/10/270 37.2 (L)  39.0 - 52.0 % Final   MCV 09/24/2023 93.7  80.0 - 100.0 fL Final   MCH 09/24/2023 33.2  26.0 - 34.0 pg Final   MCHC 09/24/2023 35.5  30.0 - 36.0 g/dL Final   RDW 53/66/4403 12.8  11.5 - 15.5 % Final   Platelets 09/24/2023 130 (L)  150 - 400 K/uL Final   nRBC 09/24/2023 0.0  0.0 - 0.2 % Final   Neutrophils Relative % 09/24/2023 60  % Final   Neutro Abs 09/24/2023 2.5  1.7 - 7.7 K/uL Final   Lymphocytes Relative 09/24/2023 27  % Final   Lymphs Abs 09/24/2023 1.1  0.7 - 4.0 K/uL Final   Monocytes Relative 09/24/2023 11  % Final   Monocytes Absolute 09/24/2023 0.4  0.1 - 1.0 K/uL Final   Eosinophils Relative 09/24/2023 1  % Final   Eosinophils Absolute 09/24/2023 0.0  0.0 - 0.5 K/uL Final   Basophils Relative 09/24/2023 1  % Final   Basophils Absolute 09/24/2023 0.0  0.0 - 0.1 K/uL Final   Immature Granulocytes 09/24/2023 0  % Final   Abs Immature Granulocytes 09/24/2023 0.01  0.00 - 0.07 K/uL Final   Performed at [facility], [address], [place], [place] [address]   Magnesium 09/24/2023 1.6 (L)  1.7 - 2.4 mg/dL Final   Performed at [facility], [address], [place], [place] [address]   WBC 09/25/2023 5.1  4.0 - 10.5 K/uL Final   RBC 09/25/2023 4.20 (L)  4.22 - 5.81 MIL/uL Final   Hemoglobin 09/25/2023 13.5  13.0 - 17.0 g/dL Final   HCT 75/64/3329 40.0  39.0 - 52.0 % Final   MCV 09/25/2023 95.2  80.0 - 100.0 fL Final   MCH 09/25/2023 32.1  26.0 - 34.0 pg Final   MCHC 09/25/2023 33.8  30.0 - 36.0 g/dL Final   RDW 51/88/4166 13.1  11.5 -  15.5 % Final    Platelets 09/25/2023 138 (L)  150 - 400 K/uL Final   REPEATED TO VERIFY   nRBC 09/25/2023 0.0  0.0 - 0.2 % Final   Performed at [facility], [address] [place], [place] [address]   Sodium 09/25/2023 134 (L)  135 - 145 mmol/L Final   Potassium 09/25/2023 3.9  3.5 - 5.1 mmol/L Final   Chloride 09/25/2023 102  98 - 111 mmol/L Final   CO2 09/25/2023 22  22 - 32 mmol/L Final   Glucose, Bld 09/25/2023 92  70 - 99 mg/dL Final   Glucose reference range applies only to samples taken after fasting for at least 8 hours.   BUN 09/25/2023 12  6 - 20 mg/dL Final   Creatinine, Ser 09/25/2023 0.61  0.61 - 1.24 mg/dL Final   Calcium 60/45/4098 9.0  8.9 - 10.3 mg/dL Final   GFR, Estimated 09/25/2023 >60  >60 mL/min Final   Comment: (NOTE) Calculated using the CKD-EPI Creatinine Equation (2021)    Anion gap 09/25/2023 10  5 - 15 Final   Performed at [facility], [address] [place], [place] [address]   Phosphorus 09/25/2023 5.5 (H)  2.5 - 4.6 mg/dL Final   Performed at [facility], [address], [place], [place] [address]   Magnesium 09/25/2023 2.2  1.7 - 2.4 mg/dL Final   Performed at [facility], [address], [place], [place] [address]   Glucose-Capillary 09/25/2023 88  70 - 99 mg/dL Final   Glucose reference range applies only to samples taken after fasting for at least 8 hours.   Glucose-Capillary 09/25/2023 101 (H)  70 - 99 mg/dL Final   Glucose reference range applies only to samples taken after fasting for at least 8 hours.   Glucose-Capillary 09/25/2023 109 (H)  70 - 99 mg/dL Final   Glucose reference range applies only to samples taken after fasting for at least 8 hours.   WBC 09/26/2023 5.2  4.0 - 10.5 K/uL Final   RBC 09/26/2023 4.26  4.22 - 5.81 MIL/uL Final   Hemoglobin 09/26/2023 14.0  13.0 - 17.0 g/dL Final   HCT 86/57/8469 40.3  39.0 - 52.0 % Final   MCV 09/26/2023 94.6  80.0 - 100.0 fL Final   MCH 09/26/2023 32.9  26.0 - 34.0 pg Final   MCHC  09/26/2023 34.7  30.0 - 36.0 g/dL Final   RDW 62/95/2841 13.1  11.5 - 15.5 % Final   Platelets 09/26/2023 143 (L)  150 - 400 K/uL Final   nRBC 09/26/2023 0.0  0.0 - 0.2 % Final   Performed at [facility], [address] [place], [place] [address]   Sodium 09/26/2023 134 (L)  135 - 145 mmol/L Final   Potassium 09/26/2023 4.4  3.5 - 5.1 mmol/L Final   Chloride 09/26/2023 106  98 - 111 mmol/L Final   CO2 09/26/2023 21 (L)  22 - 32 mmol/L Final   Glucose, Bld 09/26/2023 104 (H)  70 - 99 mg/dL Final   Glucose reference range applies only to samples taken after fasting for at least 8 hours.   BUN 09/26/2023 19  6 - 20 mg/dL Final   Creatinine, Ser 09/26/2023 0.60 (L)  0.61 - 1.24 mg/dL Final   Calcium 08/23/2535 8.6 (L)  8.9 - 10.3 mg/dL Final   GFR, Estimated 09/26/2023 >60  >60 mL/min Final   Comment: (NOTE) Calculated using the CKD-EPI Creatinine  Equation (2021)    Anion gap 09/26/2023 7  5 - 15 Final   Performed at [facility], [address] [place], [place] [address]   Phosphorus 09/26/2023 3.9  2.5 - 4.6 mg/dL Final   Performed at [facility], [address], [place], [place] [address]   Magnesium 09/26/2023 2.1  1.7 - 2.4 mg/dL Final   Performed at [facility], [address], [place], [place] [address]   WBC 09/27/2023 5.4  4.0 - 10.5 K/uL Final   RBC 09/27/2023 4.42  4.22 - 5.81 MIL/uL Final   Hemoglobin 09/27/2023 14.5  13.0 - 17.0 g/dL Final   HCT 91/47/8295 42.1  39.0 - 52.0 % Final   MCV 09/27/2023 95.2  80.0 - 100.0 fL Final   MCH 09/27/2023 32.8  26.0 - 34.0 pg Final   MCHC 09/27/2023 34.4  30.0 - 36.0 g/dL Final   RDW 62/13/0865 13.0  11.5 - 15.5 % Final   Platelets 09/27/2023 163  150 - 400 K/uL Final   nRBC 09/27/2023 0.0  0.0 - 0.2 % Final   Performed at [facility], [address] [place], [place] [address]   Sodium 09/27/2023 136  135 - 145 mmol/L Final   Potassium 09/27/2023 4.4  3.5 - 5.1 mmol/L Final   Chloride 09/27/2023  104  98 - 111 mmol/L Final   CO2 09/27/2023 25  22 - 32 mmol/L Final   Glucose, Bld 09/27/2023 96  70 - 99 mg/dL Final   Glucose reference range applies only to samples taken after fasting for at least 8 hours.   BUN 09/27/2023 11  6 - 20 mg/dL Final   Creatinine, Ser 09/27/2023 0.75  0.61 - 1.24 mg/dL Final   Calcium 62/95/2841 9.3  8.9 - 10.3 mg/dL Final   GFR, Estimated 09/27/2023 >60  >60 mL/min Final   Comment: (NOTE) Calculated using the CKD-EPI Creatinine Equation (2021)    Anion gap 09/27/2023 7  5 - 15 Final   Performed at [facility], [address] [place], [place] [address]   Phosphorus 09/27/2023 4.5  2.5 - 4.6 mg/dL Final   Performed at [facility], [address] [place], [place] [address]   Magnesium 09/27/2023 2.2  1.7 - 2.4 mg/dL Final   Performed at [facility], [address] [place], [place] [address]   Glucose-Capillary 09/26/2023 98  70 - 99 mg/dL Final   Glucose reference range applies only to samples taken after fasting for at least 8 hours.   WBC 09/28/2023 5.6  4.0 - 10.5 K/uL Final   RBC 09/28/2023 4.35  4.22 - 5.81 MIL/uL Final   Hemoglobin 09/28/2023 14.5  13.0 - 17.0 g/dL Final   HCT 40/34/7425 42.1  39.0 - 52.0 % Final   MCV 09/28/2023 96.8  80.0 - 100.0 fL Final   MCH 09/28/2023 33.3  26.0 - 34.0 pg Final   MCHC 09/28/2023 34.4  30.0 - 36.0 g/dL Final   RDW 95/63/8756 12.9  11.5 - 15.5 % Final   Platelets 09/28/2023 182  150 - 400 K/uL Final   nRBC 09/28/2023 0.0  0.0 - 0.2 % Final   Performed at [facility], [address], [place], [place] [address]   Sodium 09/28/2023 135  135 - 145 mmol/L Final   Potassium 09/28/2023 4.2  3.5 - 5.1 mmol/L Final   Chloride 09/28/2023 104  98 - 111 mmol/L Final  CO2 09/28/2023 24  22 - 32 mmol/L Final   Glucose, Bld 09/28/2023 102 (H)  70 - 99 mg/dL Final   Glucose reference range applies only to samples taken after fasting for at least 8 hours.   BUN 09/28/2023 16  6 - 20 mg/dL  Final   Creatinine, Ser 09/28/2023 0.75  0.61 - 1.24 mg/dL Final   Calcium 16/07/9603 8.8 (L)  8.9 - 10.3 mg/dL Final   GFR, Estimated 09/28/2023 >60  >60 mL/min Final   Comment: (NOTE) Calculated using the CKD-EPI Creatinine Equation (2021)    Anion gap 09/28/2023 7  5 - 15 Final   Performed at [facility], [address], [place], [place] [address]   Phosphorus 09/28/2023 3.9  2.5 - 4.6 mg/dL Final   Performed at [facility], [address], [place], [place] [address]   Magnesium 09/28/2023 1.9  1.7 - 2.4 mg/dL Final   Performed at [facility], [address] [place], [place] [address]   Total Protein 09/28/2023 6.6  6.5 - 8.1 g/dL Final   Albumin 62/13/0865 3.7  3.5 - 5.0 g/dL Final   AST 78/46/9629 28  15 - 41 U/L Final   ALT 09/28/2023 38  0 - 44 U/L Final   Alkaline Phosphatase 09/28/2023 40  38 - 126 U/L Final   Total Bilirubin 09/28/2023 0.7  <1.2 mg/dL Final   Bilirubin, Direct 09/28/2023 0.1  0.0 - 0.2 mg/dL Final   Indirect Bilirubin 09/28/2023 0.6  0.3 - 0.9 mg/dL Final   Performed at [facility], [address], [place], [place] [address]   Lipase 09/28/2023 84 (H)  11 - 51 U/L Final   Performed at [facility], [address] [place], [place] [address]    Blood Alcohol level:  Lab Results  Component Value Date   ETH 109 (H) 01/22/2024   ETH 436 (HH) 12/24/2023    Metabolic Disorder Labs: Lab Results  Component Value Date   HGBA1C 5.0 01/22/2024   MPG 96.8 01/22/2024   MPG 108 11/07/2014   No results found for: "PROLACTIN" Lab Results  Component Value Date   CHOL 202 (H) 01/22/2024   TRIG 65 01/22/2024   HDL 84 01/22/2024   CHOLHDL 2.4 01/22/2024   VLDL 13 01/22/2024   LDLCALC 105 (H) 01/22/2024   LDLCALC 137 (H) 01/09/2016    Therapeutic Lab Levels: No results found for: "LITHIUM" No results found for: "VALPROATE" No results found for: "CBMZ"  Physical Findings   AUDIT    Flowsheet Row ED from 01/23/2024 in  [facility]  Alcohol Use Disorder Identification Test Final Score (AUDIT) 36      GAD-7    Flowsheet Row Counselor from 10/22/2023 in [facility]  Total GAD-7 Score 8      PHQ2-9    Flowsheet Row ED from 01/23/2024 in [facility] ED from 01/22/2024 in [facility] Counselor from 10/22/2023 in [facility] Office Visit from 08/22/2016 in [facility] Visit from 01/09/2016 in [facility]  PHQ-2 Total Score 3 4 0 0 0  PHQ-9 Total Score 14 12 -- -- --      Flowsheet Row ED from 01/23/2024 in [facility] ED from 01/22/2024 in [facility] ED from 12/24/2023 in [facility] at [facility]  C-SSRS RISK CATEGORY Low Risk Error: Q3, 4, or 5 should not be populated when Q2 is No No Risk        Musculoskeletal  Strength & Muscle Tone: within normal limits Gait & Station: normal Patient leans: N/A  Psychiatric Specialty Exam  Presentation  General Appearance:  Appropriate for Environment; Casual; Fairly Groomed (no longer tremulous, pleasent, engaged)  Eye Contact: Good  Speech: Clear and Coherent; Normal Rate (spontaneous)  Speech Volume: Normal  Handedness: Right   Mood and Affect  Mood: Anxious (still present, requiring PRN vistaril, but continues to improve)  Affect: Appropriate; Congruent; Full Range   Thought Process  Thought Processes: Coherent; Goal Directed; Linear  Descriptions of Associations:Intact  Orientation:Full (Time, Place and Person)  Thought Content:Logical; WDL  Diagnosis of Schizophrenia or Schizoaffective disorder in past: No    Hallucinations:Hallucinations: None   Ideas of Reference:None  Suicidal Thoughts:Suicidal  Thoughts: No   Homicidal Thoughts:Homicidal Thoughts: No    Sensorium  Memory: Immediate Good; Recent Good  Judgment: Good  Insight: Fair   Art therapist  Concentration: Good  Attention Span: Good  Recall: Good  Fund of Knowledge: Good  Language: Good   Psychomotor Activity  Psychomotor Activity: Psychomotor Activity: Normal    Assets  Assets: Communication Skills; Desire for Improvement; Resilience   Sleep  Sleep: Sleep: Fair (with trazodone, requested that it be scheduled instead of PRN here)  Physical Exam  Physical Exam Vitals reviewed.  Constitutional:      General: He is not in acute distress.    Appearance: Normal appearance. He is not diaphoretic.     Comments: Somewhat tremulous  HENT:     Head: Normocephalic and atraumatic.     Nose: No congestion.  Eyes:     Conjunctiva/sclera: Conjunctivae normal.  Pulmonary:     Effort: Pulmonary effort is normal. No respiratory distress.  Skin:      Neurological:     General: No focal deficit present.     Mental Status: He is alert and oriented to person, place, and time.     Gait: Gait normal.    Blood pressure 109/73, pulse 60, temperature 98.7 F (37.1 C), temperature source Oral, resp. rate 18, SpO2 99%. There is no height or weight on file to calculate BMI.  Treatment Plan Summary: Daily contact with patient to assess and evaluate symptoms and progress in treatment and Medication management   Trevor [name] is a 43 y.o. male with AUD with complicated w/d (DTs/sz), MDD, GAD, no inpatient psych admission or suicide attempt, chronic hep C, recurrent HSV here at [facility] for etoh detox.  Looks much better than yesterday, no longer tremulous, there is still some tremors in his hands at time, but none during my eval, he says it comes and goes.  Tolerated naltrexone well, no cravings. He feels motivated.   MDD, Recurrent, Moderate  GAD: -Continue Zoloft 50 mg daily for depression  and anxiety (new med) -Continued propranolol IR 10 mg PRN q8hrs (new med) -Continue Agitation Protocol: Zyprexa   Alcohol Withdrawal: EtOH 109, repeat cmp wnl -Continued naltrexone 50 mg qPM (new med) -Continued gabapentin 200 mg TID (new med) -CHANGED trazodone 50 mg PRN to [facility] qPM -DC CIWA, last score= 0 - CIWA 0 x2 days - 4/6 -Completed Ativan taper 4/1 -Continue Ativan 1 mg q6 PRN CIWA>10 -Continue Imodium 2-4 mg PRN diarrhea -Continue Zofran-ODT 4 mg q6 PRN nausea -Continue Thiamine 100 mg daily for  nutritional supplementation -Continue Multivitamin daily for nutritional supplementation   -Continue PRN's: Tylenol, Maalox, Atarax, Milk of Magnesia, Trazodone   HSV -Per ID recommendation, start valacyclovir 1000mg  bid for 10 total days - last dose on 4/13   Dispo: [facility] 4/8 - wants dad to pick him up and take him to [employer], DO 01/31/2024 1:49 PM

## 2024-01-31 NOTE — ED Notes (Addendum)
 Patient observed/assessed at nursing station. Patient alert and oriented x 4. Affect is flat. Patient denies pain and anxiety. He denies A/V/H. He denies having any thoughts/plan of self harm and harm towards others. Fluid and snack offered. Patient states that appetite has been good throughout the day. Verbalizes no further complaints at this time. Will continue to monitor and support.

## 2024-01-31 NOTE — ED Notes (Signed)
 Patient alert & oriented x4. Denies intent to harm self or others when asked. Denies A/VH. Patient reports anxiety, PRN medication given and anxiety was resolved. Scheduled and PRN medications administered with no complications. No acute distress noted. Support and encouragement provided. Routine safety checks conducted per facility protocol. Encouraged patient to notify staff if any thoughts of harm towards self or others arise. Patient verbalizes understanding and agreement.

## 2024-01-31 NOTE — ED Notes (Signed)
 Patient sitting in dayroom interacting with peers. No acute distress noted. No concerns voiced. Informed patient to notify staff with any needs or assistance. Patient verbalized understanding or agreement. Safety checks in place per facility policy.

## 2024-01-31 NOTE — ED Notes (Signed)
 Patient sitting in courtyard interacting with peers and participating in group. No acute distress noted. No concerns voiced. Informed patient to notify staff with any needs or assistance. Patient verbalized understanding or agreement. Safety checks in place per facility policy.

## 2024-01-31 NOTE — Group Note (Signed)
 Group Topic: Positive Affirmations  Group Date: 01/31/2024 Start Time: 1630 End Time: 1700 Facilitators: Loyce Dys, NT  Department: [facility]  Number of Participants: 6  Group Focus: affirmation Treatment Modality:  Interpersonal Therapy Interventions utilized were reminiscence and support Purpose: express feelings and increase insight  Name: Trevor [name] Date of Birth: Sep 06, 1981  MR: [id number]    Level of Participation: active Quality of Participation: attentive, cooperative, and supportive Interactions with others: gave feedback Mood/Affect: appropriate and positive Triggers (if applicable): n/a Cognition: coherent/clear, goal directed, and logical Progress: Gaining insight Response: PT had positive insights and humor. PT stated that he will surround himself with positive people and not drink alcohol because he doesn't make great choices under the influence, he is ready to change, states he is also ready to leave have a change of scenary Plan: patient will be encouraged to attend group  Patients Problems:  Patient Active Problem List   Diagnosis Date Noted   Moderate episode of recurrent major depressive disorder (HCC) 01/30/2024   GAD (generalized anxiety disorder) 01/30/2024   Transaminitis 06/18/2022   GERD (gastroesophageal reflux disease) 06/18/2022   Hypokalemia 06/18/2022   Chronic hepatitis C without hepatic coma (HCC) 06/28/2019   Psoriasis 06/07/2019   H/O withdrawal symptoms, alcohol, with delirium and seizures (HCC) 06/07/2019   Alcohol use disorder, severe, dependence (HCC) 06/03/2019   Obesity (BMI 30-39.9) 01/09/2016   HSV-2 (herpes simplex virus 2) infection

## 2024-01-31 NOTE — ED Notes (Signed)
 PRN propranolol given due to patient reports of anxiety rating 4/10. Medication administered with no complications. Environment secured, safety checks in place per facility policy.

## 2024-01-31 NOTE — ED Notes (Signed)
 Patient is in the bedroom calm and sleeping. NAD. Respirations are even and unlabored. Will continue to monitor for safety.

## 2024-01-31 NOTE — ED Notes (Signed)
 Patient observed/assessed in room in bed appearing in no immediate distress resting peacefully. Q15 minute checks continued by MHT and nursing staff. Will continue to monitor and support.

## 2024-02-01 DIAGNOSIS — F331 Major depressive disorder, recurrent, moderate: Secondary | ICD-10-CM | POA: Diagnosis not present

## 2024-02-01 DIAGNOSIS — F1020 Alcohol dependence, uncomplicated: Secondary | ICD-10-CM | POA: Diagnosis not present

## 2024-02-01 DIAGNOSIS — F411 Generalized anxiety disorder: Secondary | ICD-10-CM | POA: Diagnosis not present

## 2024-02-01 DIAGNOSIS — A6002 Herpesviral infection of other male genital organs: Secondary | ICD-10-CM | POA: Diagnosis not present

## 2024-02-01 MED ORDER — VALACYCLOVIR HCL 1 G PO TABS: 1000.0000 mg | ORAL_TABLET | Freq: Two times a day (BID) | ORAL | Status: AC

## 2024-02-01 MED ORDER — GABAPENTIN 100 MG PO CAPS: 200.0000 mg | ORAL_CAPSULE | Freq: Three times a day (TID) | ORAL | 0 refills | Status: DC

## 2024-02-01 MED ORDER — NALTREXONE HCL 50 MG PO TABS: 50.0000 mg | ORAL_TABLET | Freq: Every day | ORAL | 0 refills | Status: DC

## 2024-02-01 MED ORDER — PROPRANOLOL HCL 10 MG PO TABS: 10.0000 mg | ORAL_TABLET | Freq: Three times a day (TID) | ORAL | 0 refills | Status: DC | PRN

## 2024-02-01 MED ORDER — TRAZODONE HCL 50 MG PO TABS: 50.0000 mg | ORAL_TABLET | Freq: Every day | ORAL | 0 refills | Status: DC

## 2024-02-01 MED ORDER — SERTRALINE HCL 50 MG PO TABS: 50.0000 mg | ORAL_TABLET | ORAL | 0 refills | Status: DC

## 2024-02-01 NOTE — ED Provider Notes (Signed)
 Behavioral Health Progress Note  Date and Time: 02/01/2024 11:46 AM Name: Trevor [name] MRN:  [id number]  Subjective:  Trevor [name] is a 43 yr old male who presented on 3/28 to [facility] requesting help for EtOH Abuse, he was admitted to [facility] on 3/29 for alcohol detox and rehab services. PPHx is significant for MDD, GAD, AUD (ho sz and DTs), and has been to Residential Rehab 4 times, and no history of Suicide Attempts, Self Injurious Behavior, or Psychiatric Hospitalizations.   Patient seen in the milieu, no acute distress. Patient reports feeling "good" today. Patient reports good sleep and good appetite. Regarding withdrawal symptoms, he denies. He reports the tremors have resolved Regarding cravings, she denies. Patient denies current SI, HI, and AVH. Regarding discharge plans, he feels ready to [name] to [facility] tomorrow. His father will be picking him up.   Review of Systems  Constitutional:  Negative for malaise/fatigue.  Respiratory:  Negative for shortness of breath.   Cardiovascular:  Negative for chest pain.  Gastrointestinal:  Negative for abdominal pain, constipation, diarrhea, nausea and vomiting.  Neurological:  Negative for dizziness, tremors, weakness and headaches.    Diagnosis:  Final diagnoses:  Moderate episode of recurrent major depressive disorder (HCC)  GAD (generalized anxiety disorder)  Alcohol use disorder, severe, dependence (HCC)  HSV-2 (herpes simplex virus 2) infection  H/O withdrawal symptoms, alcohol, with delirium and seizures (HCC)  Chronic hepatitis C without hepatic coma (HCC)   Total Time spent with patient: 20 minutes  Past Psychiatric History:  Dx: AUD, MDD, GAD Residential Rehab 4 times,  no history of Suicide Attempts, Self Injurious Behavior, or Psychiatric Hospitalizations.  Past rx: wellbutrin (~2017-2020, lexapro adjunct) lexapro (maxed 2016), trazodone, vivitrol Outpatient tx: No psychiatrist Therapy - Trevor [name], LCAS at [facility]    Past  Medical History:  EtOH withdrawal seizures and DT (see 06/23/2022 dc summary), required phenobarbital and precedex Prolonged Qtc Alcoholic hepatitis Chronic hep C HSV   Family History:  Mother- Skin Cancer Paternal Grandfather- Colon Cancer Paternal Grandmother and Grandfather- Lung Cancer   Family Psychiatric  History:  Brother- EtOH Abuse Maternal Grandfather and Grandmother- EtOH Abuse No Known Diagnosis' or Suicides   Social History:  Divorced, has a daughter. Recently fired from his job. Started drinking at age 89, longest period of sobriety 6 months, been to Residential Rehab 4 times.    Current Medications:  Current Facility-Administered Medications  Medication Dose Route Frequency Provider Last Rate Last Admin   acetaminophen (TYLENOL) tablet 650 mg  650 mg Oral Q6H PRN Trevor Sa, NP       alum & mag hydroxide-simeth (MAALOX/MYLANTA) 200-200-20 MG/5ML suspension 30 mL  30 mL Oral Q4H PRN Trevor [name], Trevor M, NP       gabapentin (NEURONTIN) capsule 200 mg  200 mg Oral TID Trevor Rota, MD   200 mg at 02/01/24 0929   hydrOXYzine (ATARAX) tablet 25 mg  25 mg Oral Q6H PRN Trevor [name], Dmitri, MD   25 mg at 02/01/24 1124   magnesium hydroxide (MILK OF MAGNESIA) suspension 30 mL  30 mL Oral Daily PRN Trevor Sa, NP   30 mL at 01/25/24 1309   multivitamin with minerals tablet 1 tablet  1 tablet Oral Mal Amabile, DO   1 tablet at 02/01/24 0929   naltrexone (DEPADE) tablet 50 mg  50 mg Oral QHS Trevor Bruins, DO   50 mg at 01/31/24 2143   OLANZapine (ZYPREXA) injection 10 mg  10 mg  Intramuscular TID PRN Trevor Sa, NP       OLANZapine (ZYPREXA) injection 5 mg  5 mg Intramuscular TID PRN Trevor [name], Trevor M, NP       OLANZapine zydis (ZYPREXA) disintegrating tablet 5 mg  5 mg Oral TID PRN Trevor Sa, NP       propranolol (INDERAL) tablet 10 mg  10 mg Oral Q8H PRN Trevor Bruins, DO   10 mg at 01/31/24 0944   sertraline (ZOLOFT)  tablet 50 mg  50 mg Oral Trevor [name], Trevor Fanning, DO   50 mg at 02/01/24 0800   thiamine (VITAMIN B1) tablet 100 mg  100 mg Oral Trevor [name], Trevor Fanning, DO   100 mg at 02/01/24 8469   traZODone (DESYREL) tablet 50 mg  50 mg Oral QHS Trevor Bruins, DO   50 mg at 01/31/24 2143   valACYclovir (VALTREX) tablet 1,000 mg  1,000 mg Oral BID Trevor Ide B, MD   1,000 mg at 02/01/24 0930   Current Outpatient Medications  Medication Sig Dispense Refill   famotidine (PEPCID) 20 MG tablet Take 20 mg by mouth 2 (two) times daily. (Patient not taking: Reported on 01/22/2024)     hydrOXYzine (ATARAX) 50 MG tablet Take 50 mg by mouth every 8 (eight) hours as needed for anxiety. (Patient not taking: Reported on 01/22/2024)     Multiple Vitamin (MULTIVITAMIN WITH MINERALS) TABS tablet Take 1 tablet by mouth daily.     thiamine (VITAMIN B1) 100 MG tablet Take 1 tablet (100 mg total) by mouth daily. (Patient not taking: Reported on 01/22/2024) 30 tablet 0    Labs  Lab Results:  Admission on 01/23/2024  Component Date Value Ref Range Status   Sodium 01/30/2024 137  135 - 145 mmol/L Final   Potassium 01/30/2024 4.1  3.5 - 5.1 mmol/L Final   Chloride 01/30/2024 101  98 - 111 mmol/L Final   CO2 01/30/2024 30  22 - 32 mmol/L Final   Glucose, Bld 01/30/2024 92  70 - 99 mg/dL Final   Glucose reference range applies only to samples taken after fasting for at least 8 hours.   BUN 01/30/2024 17  6 - 20 mg/dL Final   Creatinine, Ser 01/30/2024 1.03  0.61 - 1.24 mg/dL Final   Calcium 62/95/2841 8.9  8.9 - 10.3 mg/dL Final   Total Protein 32/44/0102 7.2  6.5 - 8.1 g/dL Final   Albumin 72/53/6644 4.1  3.5 - 5.0 g/dL Final   AST 03/47/4259 18  15 - 41 U/L Final   ALT 01/30/2024 19  0 - 44 U/L Final   Alkaline Phosphatase 01/30/2024 43  38 - 126 U/L Final   Total Bilirubin 01/30/2024 0.6  0.0 - 1.2 mg/dL Final   GFR, Estimated 01/30/2024 >60  >60 mL/min Final   Comment: (NOTE) Calculated using the CKD-EPI Creatinine  Equation (2021)    Anion gap 01/30/2024 6  5 - 15 Final   Performed at [facility], [address], [place], [place] [address]  Admission on 01/22/2024, Discharged on 01/23/2024  Component Date Value Ref Range Status   WBC 01/22/2024 6.8  4.0 - 10.5 K/uL Final   RBC 01/22/2024 5.22  4.22 - 5.81 MIL/uL Final   Hemoglobin 01/22/2024 17.2 (H)  13.0 - 17.0 g/dL Final   HCT 56/43/3295 47.8  39.0 - 52.0 % Final   MCV 01/22/2024 91.6  80.0 - 100.0 fL Final   MCH 01/22/2024 33.0  26.0 - 34.0 pg Final  MCHC 01/22/2024 36.0  30.0 - 36.0 g/dL Final   RDW 40/98/1191 12.1  11.5 - 15.5 % Final   Platelets 01/22/2024 319  150 - 400 K/uL Final   nRBC 01/22/2024 0.0  0.0 - 0.2 % Final   Neutrophils Relative % 01/22/2024 70  % Final   Neutro Abs 01/22/2024 4.8  1.7 - 7.7 K/uL Final   Lymphocytes Relative 01/22/2024 25  % Final   Lymphs Abs 01/22/2024 1.7  0.7 - 4.0 K/uL Final   Monocytes Relative 01/22/2024 4  % Final   Monocytes Absolute 01/22/2024 0.3  0.1 - 1.0 K/uL Final   Eosinophils Relative 01/22/2024 0  % Final   Eosinophils Absolute 01/22/2024 0.0  0.0 - 0.5 K/uL Final   Basophils Relative 01/22/2024 1  % Final   Basophils Absolute 01/22/2024 0.1  0.0 - 0.1 K/uL Final   Immature Granulocytes 01/22/2024 0  % Final   Abs Immature Granulocytes 01/22/2024 0.02  0.00 - 0.07 K/uL Final   Performed at [facility], [address], [place], [place] [address]   Sodium 01/22/2024 138  135 - 145 mmol/L Final   Potassium 01/22/2024 3.9  3.5 - 5.1 mmol/L Final   Chloride 01/22/2024 99  98 - 111 mmol/L Final   CO2 01/22/2024 25  22 - 32 mmol/L Final   Glucose, Bld 01/22/2024 99  70 - 99 mg/dL Final   Glucose reference range applies only to samples taken after fasting for at least 8 hours.   BUN 01/22/2024 13  6 - 20 mg/dL Final   Creatinine, Ser 01/22/2024 0.83  0.61 - 1.24 mg/dL Final   Calcium 56/21/3086 8.9  8.9 - 10.3 mg/dL Final   Total Protein 57/84/6962 7.5  6.5 - 8.1 g/dL Final    Albumin 95/28/4132 4.6  3.5 - 5.0 g/dL Final   AST 44/10/270 44 (H)  15 - 41 U/L Final   ALT 01/22/2024 46 (H)  0 - 44 U/L Final   Alkaline Phosphatase 01/22/2024 60  38 - 126 U/L Final   Total Bilirubin 01/22/2024 0.8  0.0 - 1.2 mg/dL Final   GFR, Estimated 01/22/2024 >60  >60 mL/min Final   Comment: (NOTE) Calculated using the CKD-EPI Creatinine Equation (2021)    Anion gap 01/22/2024 14  5 - 15 Final   Performed at [facility], [address], [place], [place] [address]   Hgb A1c MFr Bld 01/22/2024 5.0  4.8 - 5.6 % Final   Comment: (NOTE) Pre diabetes:          5.7%-6.4%  Diabetes:              >6.4%  Glycemic control for   <7.0% adults with diabetes    Mean Plasma Glucose 01/22/2024 96.8  mg/dL Final   Performed at [facility], [address], [place], [place] [address]   Magnesium 01/22/2024 1.9  1.7 - 2.4 mg/dL Final   Performed at [facility], [address] [place], [place] [address]   Alcohol, Ethyl ([name]) 01/22/2024 109 (H)  <10 mg/dL Final   Comment: (NOTE) Lowest detectable limit for serum alcohol is 10 mg/dL.  For medical purposes only. Performed at [facility], [address], [place], [place] [address]    Cholesterol 01/22/2024 202 (H)  0 - 200 mg/dL Final   Triglycerides 64/33/2951 65  <150 mg/dL Final   HDL 88/41/6606 84  >40 mg/dL Final   Total CHOL/HDL Ratio 01/22/2024  2.4  RATIO Final   VLDL 01/22/2024 13  0 - 40 mg/dL Final   LDL Cholesterol 01/22/2024 105 (H)  0 - 99 mg/dL Final   Comment:        Total Cholesterol/HDL:CHD Risk Coronary Heart Disease Risk Table                     Men   Women  1/2 Average Risk   3.4   3.3  Average Risk       5.0   4.4  2 X Average Risk   9.6   7.1  3 X Average Risk  23.4   11.0        Use the calculated Patient Ratio above and the CHD Risk Table to determine the patient's CHD Risk.        ATP III CLASSIFICATION (LDL):  <100     mg/dL   Optimal  409-811  mg/dL   Near or Above                     Optimal  130-159  mg/dL   Borderline  914-782  mg/dL   High  >956     mg/dL   Very High Performed at [facility], [address], [place], [place] [address]    TSH 01/22/2024 0.458  0.350 - 4.500 uIU/mL Final   Comment: Performed by a 3rd Generation assay with a functional sensitivity of <=0.01 uIU/mL. Performed at [facility], [address] [place], [place] [address]    RPR Ser Ql 01/22/2024 NON REACTIVE  NON REACTIVE Final   Performed at [facility], [address] [place], [place] [address]   Color, Urine 01/22/2024 YELLOW  YELLOW Final   APPearance 01/22/2024 CLEAR  CLEAR Final   Specific Gravity, Urine 01/22/2024 1.020  1.005 - 1.030 Final   pH 01/22/2024 5.0  5.0 - 8.0 Final   Glucose, UA 01/22/2024 NEGATIVE  NEGATIVE mg/dL Final   Hgb urine dipstick 01/22/2024 NEGATIVE  NEGATIVE Final   Bilirubin Urine 01/22/2024 NEGATIVE  NEGATIVE Final   Ketones, ur 01/22/2024 5 (A)  NEGATIVE mg/dL Final   Protein, ur 52/84/1324 NEGATIVE  NEGATIVE mg/dL Final   Nitrite 40/07/2724 NEGATIVE  NEGATIVE Final   Leukocytes,Ua 01/22/2024 NEGATIVE  NEGATIVE Final   Performed at [facility], [address], [place], [place] [address]   POC Amphetamine UR 01/22/2024 None Detected   Final   POC Secobarbital (BAR) 01/22/2024 None Detected   Final   POC Buprenorphine (BUP) 01/22/2024 None Detected   Final   POC Oxazepam (BZO) 01/22/2024 None Detected   Final   POC Cocaine UR 01/22/2024 None Detected   Final   POC Methamphetamine UR 01/22/2024 None Detected   Final   POC Morphine 01/22/2024 None Detected   Final   POC Methadone UR 01/22/2024 None Detected   Final   POC Oxycodone UR 01/22/2024 None Detected   Final   POC Marijuana UR 01/22/2024 None Detected   Final  Admission on 12/24/2023, Discharged on 12/25/2023  Component Date Value Ref Range Status   Sodium 12/24/2023 144  135 - 145 mmol/L Final   Potassium 12/24/2023 3.8  3.5 - 5.1 mmol/L Final    Chloride 12/24/2023 107  98 - 111 mmol/L Final   CO2 12/24/2023 26  22 - 32 mmol/L Final   Glucose, Bld 12/24/2023 96  70 - 99 mg/dL Final   Glucose reference  range applies only to samples taken after fasting for at least 8 hours.   BUN 12/24/2023 11  6 - 20 mg/dL Final   Creatinine, Ser 12/24/2023 0.62  0.61 - 1.24 mg/dL Final   Calcium 08/65/7846 8.6 (L)  8.9 - 10.3 mg/dL Final   Total Protein 96/29/5284 7.8  6.5 - 8.1 g/dL Final   Albumin 13/24/4010 4.3  3.5 - 5.0 g/dL Final   AST 27/25/3664 34  15 - 41 U/L Final   ALT 12/24/2023 27  0 - 44 U/L Final   Alkaline Phosphatase 12/24/2023 56  38 - 126 U/L Final   Total Bilirubin 12/24/2023 0.4  0.0 - 1.2 mg/dL Final   GFR, Estimated 12/24/2023 >60  >60 mL/min Final   Comment: (NOTE) Calculated using the CKD-EPI Creatinine Equation (2021)    Anion gap 12/24/2023 11  5 - 15 Final   Performed at [facility], [address], [place], [place] [address]   Alcohol, Ethyl ([name]) 12/24/2023 436 (HH)  <10 mg/dL Final   Comment: CRITICAL RESULT CALLED TO, READ BACK BY AND VERIFIED WITH Barnett Applebaum, RN 12/25/23 0050 BY K. DAVIS (NOTE) Lowest detectable limit for serum alcohol is 10 mg/dL.  For medical purposes only. Performed at [facility], [address], [place], [place] [address]    Opiates 12/25/2023 NONE DETECTED  NONE DETECTED Final   Cocaine 12/25/2023 NONE DETECTED  NONE DETECTED Final   Benzodiazepines 12/25/2023 NONE DETECTED  NONE DETECTED Final   Amphetamines 12/25/2023 NONE DETECTED  NONE DETECTED Final   Tetrahydrocannabinol 12/25/2023 NONE DETECTED  NONE DETECTED Final   Barbiturates 12/25/2023 NONE DETECTED  NONE DETECTED Final   Comment: (NOTE) DRUG SCREEN FOR MEDICAL PURPOSES ONLY.  IF CONFIRMATION IS NEEDED FOR ANY PURPOSE, NOTIFY LAB WITHIN 5 DAYS.  LOWEST DETECTABLE LIMITS FOR URINE DRUG SCREEN Drug Class                     Cutoff (ng/mL) Amphetamine and metabolites     1000 Barbiturate and metabolites    200 Benzodiazepine                 200 Opiates and metabolites        300 Cocaine and metabolites        300 THC                            50 Performed at [facility], [address] [place], [place] [address]    WBC 12/24/2023 5.1  4.0 - 10.5 K/uL Final   RBC 12/24/2023 4.78  4.22 - 5.81 MIL/uL Final   Hemoglobin 12/24/2023 16.0  13.0 - 17.0 g/dL Final   HCT 64/33/2951 45.7  39.0 - 52.0 % Final   MCV 12/24/2023 95.6  80.0 - 100.0 fL Final   MCH 12/24/2023 33.5  26.0 - 34.0 pg Final   MCHC 12/24/2023 35.0  30.0 - 36.0 g/dL Final   RDW 88/41/6606 12.8  11.5 - 15.5 % Final   Platelets 12/24/2023 292  150 - 400 K/uL Final   nRBC 12/24/2023 0.0  0.0 - 0.2 % Final   Neutrophils Relative % 12/24/2023 61  % Final   Neutro Abs 12/24/2023 3.0  1.7 - 7.7 K/uL Final   Lymphocytes Relative 12/24/2023 31  % Final   Lymphs Abs 12/24/2023 1.6  0.7 - 4.0 K/uL Final   Monocytes Relative 12/24/2023 8  %  Final   Monocytes Absolute 12/24/2023 0.4  0.1 - 1.0 K/uL Final   Eosinophils Relative 12/24/2023 0  % Final   Eosinophils Absolute 12/24/2023 0.0  0.0 - 0.5 K/uL Final   Basophils Relative 12/24/2023 0  % Final   Basophils Absolute 12/24/2023 0.0  0.0 - 0.1 K/uL Final   Immature Granulocytes 12/24/2023 0  % Final   Abs Immature Granulocytes 12/24/2023 0.02  0.00 - 0.07 K/uL Final   Performed at [facility], [address], [place], [place] [address]   Lipase 12/24/2023 41  11 - 51 U/L Final   Performed at [facility], [address] [place], [place] [address]   Color, Urine 12/25/2023 YELLOW  YELLOW Final   APPearance 12/25/2023 HAZY (A)  CLEAR Final   Specific Gravity, Urine 12/25/2023 1.010  1.005 - 1.030 Final   pH 12/25/2023 7.0  5.0 - 8.0 Final   Glucose, UA 12/25/2023 NEGATIVE  NEGATIVE mg/dL Final   Hgb urine dipstick 12/25/2023 NEGATIVE  NEGATIVE Final   Bilirubin Urine 12/25/2023 NEGATIVE   NEGATIVE Final   Ketones, ur 12/25/2023 NEGATIVE  NEGATIVE mg/dL Final   Protein, ur 09/81/1914 NEGATIVE  NEGATIVE mg/dL Final   Nitrite 78/29/5621 NEGATIVE  NEGATIVE Final   Leukocytes,Ua 12/25/2023 NEGATIVE  NEGATIVE Final   Performed at [facility], [address], [place], [place] [address]  Admission on 09/23/2023, Discharged on 09/29/2023  Component Date Value Ref Range Status   WBC 09/23/2023 5.2  4.0 - 10.5 K/uL Final   RBC 09/23/2023 5.10  4.22 - 5.81 MIL/uL Final   Hemoglobin 09/23/2023 16.9  13.0 - 17.0 g/dL Final   HCT 78/46/9629 46.5  39.0 - 52.0 % Final   MCV 09/23/2023 91.2  80.0 - 100.0 fL Final   MCH 09/23/2023 33.1  26.0 - 34.0 pg Final   MCHC 09/23/2023 36.3 (H)  30.0 - 36.0 g/dL Final   RDW 52/84/1324 12.9  11.5 - 15.5 % Final   Platelets 09/23/2023 185  150 - 400 K/uL Final   nRBC 09/23/2023 0.0  0.0 - 0.2 % Final   Performed at [facility], [address], [place], [place] [address]   Sodium 09/23/2023 139  135 - 145 mmol/L Final   Potassium 09/23/2023 3.5  3.5 - 5.1 mmol/L Final   Chloride 09/23/2023 99  98 - 111 mmol/L Final   CO2 09/23/2023 22  22 - 32 mmol/L Final   Glucose, Bld 09/23/2023 169 (H)  70 - 99 mg/dL Final   Glucose reference range applies only to samples taken after fasting for at least 8 hours.   BUN 09/23/2023 6  6 - 20 mg/dL Final   Creatinine, Ser 09/23/2023 0.71  0.61 - 1.24 mg/dL Final   Calcium 72/53/6644 8.4 (L)  8.9 - 10.3 mg/dL Final   Total Protein 03/47/4259 6.9  6.5 - 8.1 g/dL Final   Albumin 56/38/7564 4.0  3.5 - 5.0 g/dL Final   AST 33/29/5188 135 (H)  15 - 41 U/L Final   ALT 09/23/2023 76 (H)  0 - 44 U/L Final   Alkaline Phosphatase 09/23/2023 58  38 - 126 U/L Final   Total Bilirubin 09/23/2023 0.9  <1.2 mg/dL Final   GFR, Estimated 09/23/2023 >60  >60 mL/min Final   Comment: (NOTE) Calculated using the CKD-EPI Creatinine Equation (2021)    Anion gap 09/23/2023 18 (H)  5 - 15 Final   Performed at  [facility],  [address], [place], [place] [address]   Troponin I (High Sensitivity) 09/23/2023 14  <18 ng/L Final   Comment: (NOTE) Elevated high sensitivity troponin I (hsTnI) values and significant  changes across serial measurements may suggest ACS but many other  chronic and acute conditions are known to elevate hsTnI results.  Refer to the "Links" section for chest pain algorithms and additional  guidance. Performed at [facility], [address], [place], [place] [address]    Alcohol, Ethyl ([name]) 09/23/2023 412 (HH)  <10 mg/dL Final   Comment: CRITICAL RESULT CALLED TO, READ BACK BY AND VERIFIED WITH [name]. OSORIO, RN AT [phone] 11.27.24 JLASIGAN (NOTE) Lowest detectable limit for serum alcohol is 10 mg/dL.  For medical purposes only. Performed at [facility], [address], [place], [place] [address]    Lipase 09/23/2023 86 (H)  11 - 51 U/L Final   Performed at [facility], [address] [place], [place] [address]   Troponin I (High Sensitivity) 09/23/2023 19 (H)  <18 ng/L Final   Comment: (NOTE) Elevated high sensitivity troponin I (hsTnI) values and significant  changes across serial measurements may suggest ACS but many other  chronic and acute conditions are known to elevate hsTnI results.  Refer to the "Links" section for chest pain algorithms and additional  guidance. Performed at [facility], [address], [place], [place] [address]    Magnesium 09/23/2023 1.8  1.7 - 2.4 mg/dL Final   Performed at [facility], [address] [place], [place] [address]   Phosphorus 09/23/2023 2.2 (L)  2.5 - 4.6 mg/dL Final   Performed at [facility], [address] [place], [place] [address]   Opiates 09/23/2023 NONE DETECTED  NONE DETECTED Final   Cocaine 09/23/2023 NONE DETECTED  NONE DETECTED Final   Benzodiazepines 09/23/2023 NONE DETECTED  NONE DETECTED Final   Amphetamines 09/23/2023 NONE DETECTED  NONE DETECTED Final    Tetrahydrocannabinol 09/23/2023 NONE DETECTED  NONE DETECTED Final   Barbiturates 09/23/2023 NONE DETECTED  NONE DETECTED Final   Comment: (NOTE) DRUG SCREEN FOR MEDICAL PURPOSES ONLY.  IF CONFIRMATION IS NEEDED FOR ANY PURPOSE, NOTIFY LAB WITHIN 5 DAYS.  LOWEST DETECTABLE LIMITS FOR URINE DRUG SCREEN Drug Class                     Cutoff (ng/mL) Amphetamine and metabolites    1000 Barbiturate and metabolites    200 Benzodiazepine                 200 Opiates and metabolites        300 Cocaine and metabolites        300 THC                            50 Performed at [facility], [address] [place], [place] [address]    HIV Screen 4th Generation wRfx 09/23/2023 Non Reactive  Non Reactive Final   Performed at [facility], [address], [place], [place] [address]   Phosphorus 09/24/2023 3.1  2.5 - 4.6 mg/dL Final   Performed at [facility], [address] [place], [place] [address]   Lipase 09/24/2023 92 (H)  11 - 51 U/L Final   Performed at [facility], [address] [place], [place] [address]   Sodium 09/24/2023 130 (L)  135 - 145 mmol/L Final   DELTA CHECK NOTED   Potassium 09/24/2023 3.8  3.5 - 5.1 mmol/L Final   Chloride 09/24/2023 98  98 - 111 mmol/L Final   CO2 09/24/2023 24  22 - 32 mmol/L Final   Glucose, Bld 09/24/2023 94  70 - 99 mg/dL Final   Glucose reference range applies only to samples taken after fasting for at least 8 hours.   BUN 09/24/2023 9  6 - 20 mg/dL Final   Creatinine, Ser 09/24/2023 0.63  0.61 - 1.24 mg/dL Final   Calcium 16/07/9603 8.5 (L)  8.9 - 10.3 mg/dL Final   Total Protein 54/06/8118 5.6 (L)  6.5 - 8.1 g/dL Final   Albumin 14/78/2956 3.2 (L)  3.5 - 5.0 g/dL Final   AST 21/30/8657 78 (H)  15 - 41 U/L Final   ALT 09/24/2023 54 (H)  0 - 44 U/L Final   Alkaline Phosphatase 09/24/2023 42  38 - 126 U/L Final   Total Bilirubin 09/24/2023 1.2 (H)  <1.2 mg/dL Final   GFR, Estimated 09/24/2023 >60  >60 mL/min Final    Comment: (NOTE) Calculated using the CKD-EPI Creatinine Equation (2021)    Anion gap 09/24/2023 8  5 - 15 Final   Performed at [facility], [address], [place], [place] [address]   WBC 09/24/2023 4.1  4.0 - 10.5 K/uL Final   RBC 09/24/2023 3.97 (L)  4.22 - 5.81 MIL/uL Final   Hemoglobin 09/24/2023 13.2  13.0 - 17.0 g/dL Final   HCT 29/52/8413 37.2 (L)  39.0 - 52.0 % Final   MCV 09/24/2023 93.7  80.0 - 100.0 fL Final   MCH 09/24/2023 33.2  26.0 - 34.0 pg Final   MCHC 09/24/2023 35.5  30.0 - 36.0 g/dL Final   RDW 24/40/1027 12.8  11.5 - 15.5 % Final   Platelets 09/24/2023 130 (L)  150 - 400 K/uL Final   nRBC 09/24/2023 0.0  0.0 - 0.2 % Final   Neutrophils Relative % 09/24/2023 60  % Final   Neutro Abs 09/24/2023 2.5  1.7 - 7.7 K/uL Final   Lymphocytes Relative 09/24/2023 27  % Final   Lymphs Abs 09/24/2023 1.1  0.7 - 4.0 K/uL Final   Monocytes Relative 09/24/2023 11  % Final   Monocytes Absolute 09/24/2023 0.4  0.1 - 1.0 K/uL Final   Eosinophils Relative 09/24/2023 1  % Final   Eosinophils Absolute 09/24/2023 0.0  0.0 - 0.5 K/uL Final   Basophils Relative 09/24/2023 1  % Final   Basophils Absolute 09/24/2023 0.0  0.0 - 0.1 K/uL Final   Immature Granulocytes 09/24/2023 0  % Final   Abs Immature Granulocytes 09/24/2023 0.01  0.00 - 0.07 K/uL Final   Performed at [facility], [address] [place], [place] [address]   Magnesium 09/24/2023 1.6 (L)  1.7 - 2.4 mg/dL Final   Performed at [facility], [address], [place], [place] [address]   WBC 09/25/2023 5.1  4.0 - 10.5 K/uL Final   RBC 09/25/2023 4.20 (L)  4.22 - 5.81 MIL/uL Final   Hemoglobin 09/25/2023 13.5  13.0 - 17.0 g/dL Final   HCT 74/25/9563 40.0  39.0 - 52.0 % Final   MCV 09/25/2023 95.2  80.0 - 100.0 fL Final   MCH 09/25/2023 32.1  26.0 - 34.0 pg Final   MCHC 09/25/2023 33.8  30.0 - 36.0 g/dL Final   RDW 87/56/4332 13.1  11.5 - 15.5 % Final  Platelets 09/25/2023 138 (L)  150 - 400 K/uL Final    REPEATED TO VERIFY   nRBC 09/25/2023 0.0  0.0 - 0.2 % Final   Performed at [facility], [address], [place], [place] [address]   Sodium 09/25/2023 134 (L)  135 - 145 mmol/L Final   Potassium 09/25/2023 3.9  3.5 - 5.1 mmol/L Final   Chloride 09/25/2023 102  98 - 111 mmol/L Final   CO2 09/25/2023 22  22 - 32 mmol/L Final   Glucose, Bld 09/25/2023 92  70 - 99 mg/dL Final   Glucose reference range applies only to samples taken after fasting for at least 8 hours.   BUN 09/25/2023 12  6 - 20 mg/dL Final   Creatinine, Ser 09/25/2023 0.61  0.61 - 1.24 mg/dL Final   Calcium 78/46/9629 9.0  8.9 - 10.3 mg/dL Final   GFR, Estimated 09/25/2023 >60  >60 mL/min Final   Comment: (NOTE) Calculated using the CKD-EPI Creatinine Equation (2021)    Anion gap 09/25/2023 10  5 - 15 Final   Performed at [facility], [address], [place], [place] [address]   Phosphorus 09/25/2023 5.5 (H)  2.5 - 4.6 mg/dL Final   Performed at [facility], [address], [place], [place] [address]   Magnesium 09/25/2023 2.2  1.7 - 2.4 mg/dL Final   Performed at [facility], [address], [place], [place] [address]   Glucose-Capillary 09/25/2023 88  70 - 99 mg/dL Final   Glucose reference range applies only to samples taken after fasting for at least 8 hours.   Glucose-Capillary 09/25/2023 101 (H)  70 - 99 mg/dL Final   Glucose reference range applies only to samples taken after fasting for at least 8 hours.   Glucose-Capillary 09/25/2023 109 (H)  70 - 99 mg/dL Final   Glucose reference range applies only to samples taken after fasting for at least 8 hours.   WBC 09/26/2023 5.2  4.0 - 10.5 K/uL Final   RBC 09/26/2023 4.26  4.22 - 5.81 MIL/uL Final   Hemoglobin 09/26/2023 14.0  13.0 - 17.0 g/dL Final   HCT 53/66/4403 40.3  39.0 - 52.0 % Final   MCV 09/26/2023 94.6  80.0 - 100.0 fL Final   MCH 09/26/2023 32.9  26.0 - 34.0 pg Final   MCHC 09/26/2023 34.7  30.0 - 36.0 g/dL Final   RDW  47/42/5956 13.1  11.5 - 15.5 % Final   Platelets 09/26/2023 143 (L)  150 - 400 K/uL Final   nRBC 09/26/2023 0.0  0.0 - 0.2 % Final   Performed at [facility], [address], [place], [place] [address]   Sodium 09/26/2023 134 (L)  135 - 145 mmol/L Final   Potassium 09/26/2023 4.4  3.5 - 5.1 mmol/L Final   Chloride 09/26/2023 106  98 - 111 mmol/L Final   CO2 09/26/2023 21 (L)  22 - 32 mmol/L Final   Glucose, Bld 09/26/2023 104 (H)  70 - 99 mg/dL Final   Glucose reference range applies only to samples taken after fasting for at least 8 hours.   BUN 09/26/2023 19  6 - 20 mg/dL Final   Creatinine, Ser 09/26/2023 0.60 (L)  0.61 - 1.24 mg/dL Final   Calcium 43/32/9518 8.6 (L)  8.9 - 10.3 mg/dL Final   GFR, Estimated 09/26/2023 >60  >60 mL/min Final   Comment: (NOTE) Calculated using the CKD-EPI Creatinine Equation (2021)  Anion gap 09/26/2023 7  5 - 15 Final   Performed at [facility], [address], [place], [place] [address]   Phosphorus 09/26/2023 3.9  2.5 - 4.6 mg/dL Final   Performed at [facility], [address] [place], [place] [address]   Magnesium 09/26/2023 2.1  1.7 - 2.4 mg/dL Final   Performed at [facility], [address] [place], [place] [address]   WBC 09/27/2023 5.4  4.0 - 10.5 K/uL Final   RBC 09/27/2023 4.42  4.22 - 5.81 MIL/uL Final   Hemoglobin 09/27/2023 14.5  13.0 - 17.0 g/dL Final   HCT 32/44/0102 42.1  39.0 - 52.0 % Final   MCV 09/27/2023 95.2  80.0 - 100.0 fL Final   MCH 09/27/2023 32.8  26.0 - 34.0 pg Final   MCHC 09/27/2023 34.4  30.0 - 36.0 g/dL Final   RDW 72/53/6644 13.0  11.5 - 15.5 % Final   Platelets 09/27/2023 163  150 - 400 K/uL Final   nRBC 09/27/2023 0.0  0.0 - 0.2 % Final   Performed at [facility], [address] [place], [place] [address]   Sodium 09/27/2023 136  135 - 145 mmol/L Final   Potassium 09/27/2023 4.4  3.5 - 5.1 mmol/L Final   Chloride 09/27/2023 104  98 - 111 mmol/L Final   CO2 09/27/2023 25   22 - 32 mmol/L Final   Glucose, Bld 09/27/2023 96  70 - 99 mg/dL Final   Glucose reference range applies only to samples taken after fasting for at least 8 hours.   BUN 09/27/2023 11  6 - 20 mg/dL Final   Creatinine, Ser 09/27/2023 0.75  0.61 - 1.24 mg/dL Final   Calcium 25/95/6387 9.3  8.9 - 10.3 mg/dL Final   GFR, Estimated 09/27/2023 >60  >60 mL/min Final   Comment: (NOTE) Calculated using the CKD-EPI Creatinine Equation (2021)    Anion gap 09/27/2023 7  5 - 15 Final   Performed at [facility], [address] [place], [place] [address]   Phosphorus 09/27/2023 4.5  2.5 - 4.6 mg/dL Final   Performed at [facility], [address] [place], [place] [address]   Magnesium 09/27/2023 2.2  1.7 - 2.4 mg/dL Final   Performed at [facility], [address], [place], [place] [address]   Glucose-Capillary 09/26/2023 98  70 - 99 mg/dL Final   Glucose reference range applies only to samples taken after fasting for at least 8 hours.   WBC 09/28/2023 5.6  4.0 - 10.5 K/uL Final   RBC 09/28/2023 4.35  4.22 - 5.81 MIL/uL Final   Hemoglobin 09/28/2023 14.5  13.0 - 17.0 g/dL Final   HCT 04/25/1600 42.1  39.0 - 52.0 % Final   MCV 09/28/2023 96.8  80.0 - 100.0 fL Final   MCH 09/28/2023 33.3  26.0 - 34.0 pg Final   MCHC 09/28/2023 34.4  30.0 - 36.0 g/dL Final   RDW 09/32/3557 12.9  11.5 - 15.5 % Final   Platelets 09/28/2023 182  150 - 400 K/uL Final   nRBC 09/28/2023 0.0  0.0 - 0.2 % Final   Performed at [facility], [address], [place], [place] [address]   Sodium 09/28/2023 135  135 - 145 mmol/L Final   Potassium 09/28/2023 4.2  3.5 - 5.1 mmol/L Final   Chloride 09/28/2023 104  98 - 111 mmol/L Final   CO2 09/28/2023 24  22 - 32 mmol/L Final   Glucose, Bld 09/28/2023 102 (H)  70 - 99 mg/dL Final   Glucose reference range applies only to samples taken after fasting for at least 8 hours.   BUN 09/28/2023 16  6 - 20 mg/dL Final   Creatinine, Ser 09/28/2023 0.75  0.61 -  1.24 mg/dL Final   Calcium 78/29/5621 8.8 (L)  8.9 - 10.3 mg/dL Final   GFR, Estimated 09/28/2023 >60  >60 mL/min Final   Comment: (NOTE) Calculated using the CKD-EPI Creatinine Equation (2021)    Anion gap 09/28/2023 7  5 - 15 Final   Performed at [facility], [address] [place], [place] [address]   Phosphorus 09/28/2023 3.9  2.5 - 4.6 mg/dL Final   Performed at [facility], [address] [place], [place] [address]   Magnesium 09/28/2023 1.9  1.7 - 2.4 mg/dL Final   Performed at [facility], [address] [place], [place] [address]   Total Protein 09/28/2023 6.6  6.5 - 8.1 g/dL Final   Albumin 84/13/2440 3.7  3.5 - 5.0 g/dL Final   AST 08/23/2535 28  15 - 41 U/L Final   ALT 09/28/2023 38  0 - 44 U/L Final   Alkaline Phosphatase 09/28/2023 40  38 - 126 U/L Final   Total Bilirubin 09/28/2023 0.7  <1.2 mg/dL Final   Bilirubin, Direct 09/28/2023 0.1  0.0 - 0.2 mg/dL Final   Indirect Bilirubin 09/28/2023 0.6  0.3 - 0.9 mg/dL Final   Performed at [facility], [address], [place], [place] [address]   Lipase 09/28/2023 84 (H)  11 - 51 U/L Final   Performed at [facility], [address] [place], [place] [address]    Blood Alcohol level:  Lab Results  Component Value Date   ETH 109 (H) 01/22/2024   ETH 436 (HH) 12/24/2023    Metabolic Disorder Labs: Lab Results  Component Value Date   HGBA1C 5.0 01/22/2024   MPG 96.8 01/22/2024   MPG 108 11/07/2014   No results found for: "PROLACTIN" Lab Results  Component Value Date   CHOL 202 (H) 01/22/2024   TRIG 65 01/22/2024   HDL 84 01/22/2024   CHOLHDL 2.4 01/22/2024   VLDL 13 01/22/2024   LDLCALC 105 (H) 01/22/2024   LDLCALC 137 (H) 01/09/2016    Therapeutic Lab Levels: No results found for: "LITHIUM" No results found for: "VALPROATE" No results found for: "CBMZ"  Physical Findings   AUDIT    Flowsheet Row ED from 01/23/2024 in [facility]   Alcohol Use Disorder Identification Test Final Score (AUDIT) 36      GAD-7    Flowsheet Row Counselor from 10/22/2023 in [facility]  Total GAD-7 Score 8      PHQ2-9    Flowsheet Row ED from 01/23/2024 in [facility] ED from 01/22/2024 in [facility] Counselor from 10/22/2023 in [facility] Office Visit from 08/22/2016 in [facility] Visit from 01/09/2016 in [facility]  PHQ-2 Total Score 3 4 0 0 0  PHQ-9 Total Score 14 12 -- -- --      Flowsheet Row ED from 01/23/2024 in [facility] ED from 01/22/2024 in [facility] ED from 12/24/2023 in [facility] Emergency Department at  [facility]  C-SSRS RISK CATEGORY Low Risk Error: Q3, 4, or 5 should not be populated when Q2 is No No Risk        Musculoskeletal  Strength & Muscle Tone: within normal limits Gait & Station: normal Patient leans: N/A  Psychiatric Specialty Exam  Presentation  General Appearance:  Appropriate for Environment; Fairly Groomed  Eye Contact: Fair  Speech: Clear and Coherent; Normal Rate  Speech Volume: Normal  Handedness: Right   Mood and Affect  Mood: Euthymic  Affect: Congruent; Appropriate   Thought Process  Thought Processes: Coherent  Descriptions of Associations:Intact  Orientation:Full (Time, Place and Person)  Thought Content:Logical  Diagnosis of Schizophrenia or Schizoaffective disorder in past: No    Hallucinations:Hallucinations: None   Ideas of Reference:None  Suicidal Thoughts:Suicidal Thoughts: No   Homicidal Thoughts:Homicidal Thoughts: No    Sensorium  Memory: Remote Good  Judgment: Fair  Insight: Fair   Art therapist  Concentration: Good  Attention  Span: Good  Recall: Good  Fund of Knowledge: Good  Language: Good   Psychomotor Activity  Psychomotor Activity: Psychomotor Activity: Normal    Assets  Assets: Communication Skills; Resilience; Social Support   Sleep  Sleep: Sleep: Good  Physical Exam  Physical Exam Vitals reviewed.  Constitutional:      General: He is not in acute distress.    Appearance: Normal appearance. He is not diaphoretic.  HENT:     Head: Normocephalic and atraumatic.     Nose: No congestion.  Eyes:     Conjunctiva/sclera: Conjunctivae normal.  Pulmonary:     Effort: Pulmonary effort is normal. No respiratory distress.  Skin:      Neurological:     General: No focal deficit present.     Mental Status: He is alert and oriented to person, place, and time.     Gait: Gait normal.    Blood pressure 104/76, pulse 60, temperature 98.4 F (36.9 C), temperature source Oral, resp. rate 16, SpO2 98%. There is no height or weight on file to calculate BMI.  Treatment Plan Summary: Daily contact with patient to assess and evaluate symptoms and progress in treatment and Medication management   BRYANT SAYE is a 43 y.o. male with AUD with complicated w/d (DTs/sz), MDD, GAD, no inpatient psych admission or suicide attempt, chronic hep C, recurrent HSV here at [facility] for etoh detox.  MDD, Recurrent, Moderate  GAD: -Continue Zoloft 50 mg daily for depression and anxiety (new med) -Continued propranolol IR 10 mg PRN q8hrs (new med) -Continue Agitation Protocol: Zyprexa   Alcohol Withdrawal: EtOH 109, repeat cmp wnl -Continued naltrexone 50 mg qPM (new med) -Continued gabapentin 200 mg TID (new med) -CHANGED trazodone 50 mg PRN to [facility] qPM -DC CIWA, last score= 0 - CIWA 0 x2 days - 4/6 -Completed Ativan taper 4/1 -Continue Ativan 1 mg q6 PRN CIWA>10 -Continue Imodium 2-4 mg PRN diarrhea -Continue Zofran-ODT 4 mg q6 PRN nausea -Continue Thiamine 100 mg daily for nutritional  supplementation -Continue Multivitamin daily for nutritional supplementation   -Continue PRN's: Tylenol, Maalox, Atarax, Milk of Magnesia, Trazodone   HSV -Per ID recommendation, start valacyclovir 1000mg  bid for 10 total days - last dose on 4/13   Dispo: [facility] 4/8 - wants dad to pick him up and take him to [place], [place] 02/01/2024 11:46 AM

## 2024-02-01 NOTE — Discharge Planning (Signed)
 LCSW received a phone call back from patient's father who reports he will pick up the patient on tomorrow morning at 8 AM.  No safety concerns were reported at the time of conversation.  Father reports appreciation for [facility] assistance with patient.  No other needs to report at this time.  LCSW will continue to follow and provide support to patient while on unit.  Fernande Boyden, LCSW Clinical Social Worker [facility] Ph: [phone]

## 2024-02-01 NOTE — ED Notes (Signed)
 Pt sitting in dayroom watching television and interacting with peers. No acute distress noted. No concerns voiced. Informed pt to notify staff with any needs or assistance. Pt verbalized understanding and agreement. Will continue to monitor for safety.

## 2024-02-01 NOTE — Group Note (Signed)
 Group Topic: Communication  Group Date: 02/01/2024 Start Time: 1300 End Time: 1330 Facilitators: Concha Norway, NT  Department: [facility]  Number of Participants: 4  Group Focus: activities of daily living skills Treatment Modality:  Behavior Modification Therapy Interventions utilized were leisure development Purpose: express feelings  Name: Trevor [name] Date of Birth: 26-Jan-1981  MR: [id number]    Level of Participation: minimal Quality of Participation: cooperative Interactions with others: gave feedback Mood/Affect: appropriate Triggers (if applicable):   Cognition: logical Progress: Moderate Response:   Plan: follow-up needed  Patients Problems:  Patient Active Problem List   Diagnosis Date Noted   Moderate episode of recurrent major depressive disorder (HCC) 01/30/2024   GAD (generalized anxiety disorder) 01/30/2024   Transaminitis 06/18/2022   GERD (gastroesophageal reflux disease) 06/18/2022   Hypokalemia 06/18/2022   Chronic hepatitis C without hepatic coma (HCC) 06/28/2019   Psoriasis 06/07/2019   H/O withdrawal symptoms, alcohol, with delirium and seizures (HCC) 06/07/2019   Alcohol use disorder, severe, dependence (HCC) 06/03/2019   Obesity (BMI 30-39.9) 01/09/2016   HSV-2 (herpes simplex virus 2) infection

## 2024-02-01 NOTE — ED Provider Notes (Signed)
 FBC/OBS ASAP Discharge Summary  Date and Time: 02/01/2024 3:01 PM  Name: Trevor [name]  MRN:  [id number]   Discharge Diagnoses:  Final diagnoses:  Moderate episode of recurrent major depressive disorder (HCC)  GAD (generalized anxiety disorder)  Alcohol use disorder, severe, dependence (HCC)  HSV-2 (herpes simplex virus 2) infection  H/O withdrawal symptoms, alcohol, with delirium and seizures (HCC)  Chronic hepatitis C without hepatic coma (HCC)    Subjective: Trevor [name] is a 43 yr old male who presented on 3/28 to [facility] requesting help for EtOH Abuse, he was admitted to [facility] on 3/29 for alcohol detox and rehab services. PPHx is significant for MDD, GAD, AUD (ho sz and DTs), and has been to [facility]idential Rehab 4 times, and no history of Suicide Attempts, Self Injurious Behavior, or Psychiatric Hospitalizations.   Stay Summary: The patient was evaluated each day by a clinical provider to ascertain response to treatment. Improvement was noted by the patient's report of decreasing symptoms, improved sleep and appetite, affect, medication tolerance, behavior, and participation in unit programming.  Patient was asked each day to complete a self inventory noting mood, mental status, pain, new symptoms, anxiety and concerns.  The patient's medications were managed with the following directions: -start zoloft, naltrexone, propranolol PRN for anxiety, trazodone -completed ativan taper -start valacyclovir 1000 mg BID for 10 total days- last dose on 4/13  Patient responded well to medication and being in a therapeutic and supportive environment. Positive and appropriate behavior was noted and the patient was motivated for recovery. The patient worked closely with the treatment team and case manager to develop a discharge plan with appropriate goals. Coping skills, problem solving as well as relaxation therapies were also part of the unit programming. Patient has denied SI and HI for over 48  hours.    Total Time spent with patient: 20 minutes  Past Psychiatric History:  Dx: AUD, MDD, GAD Residential Rehab 4 times,  no history of Suicide Attempts, Self Injurious Behavior, or Psychiatric Hospitalizations.  Past rx: wellbutrin (~2017-2020, lexapro adjunct) lexapro (maxed 2016), trazodone, vivitrol Outpatient tx: No psychiatrist Therapy - [name], LCAS at [facility]    Past Medical History:  EtOH withdrawal seizures and DT (see 06/23/2022 dc summary), required phenobarbital and precedex Prolonged Qtc Alcoholic hepatitis Chronic hep C HSV   Family History:  Mother- Skin Cancer Paternal Grandfather- Colon Cancer Paternal Grandmother and Grandfather- Lung Cancer   Family Psychiatric  History:  Brother- EtOH Abuse Maternal Grandfather and Grandmother- EtOH Abuse No Known Diagnosis' or Suicides   Social History:  Divorced, has a daughter. Recently fired from his job. Started drinking at age 49, longest period of sobriety 6 months, been to Residential Rehab 4 times.  Tobacco Cessation:  N/A, patient does not currently use tobacco products  Current Medications:  Current Facility-Administered Medications  Medication Dose Route Frequency Provider Last Rate Last Admin   acetaminophen (TYLENOL) tablet 650 mg  650 mg Oral Q6H PRN Marlou Sa, NP       alum & mag hydroxide-simeth (MAALOX/MYLANTA) 200-200-20 MG/5ML suspension 30 mL  30 mL Oral Q4H PRN Rayburn Go, Veronique M, NP       gabapentin (NEURONTIN) capsule 200 mg  200 mg Oral TID Miguel Rota, MD   200 mg at 02/01/24 0929   hydrOXYzine (ATARAX) tablet 25 mg  25 mg Oral Q6H PRN Miguel Rota, MD   25 mg at 02/01/24 1124   magnesium hydroxide (MILK OF MAGNESIA) suspension 30 mL  30 mL Oral Daily PRN Marlou Sa, NP   30 mL at 01/25/24 1309   multivitamin with minerals tablet 1 tablet  1 tablet Oral Mal Amabile, DO   1 tablet at 02/01/24 0929   naltrexone (DEPADE) tablet 50 mg  50 mg Oral QHS  Princess Bruins, DO   50 mg at 01/31/24 2143   OLANZapine (ZYPREXA) injection 10 mg  10 mg Intramuscular TID PRN Marlou Sa, NP       OLANZapine (ZYPREXA) injection 5 mg  5 mg Intramuscular TID PRN Marlou Sa, NP       OLANZapine zydis (ZYPREXA) disintegrating tablet 5 mg  5 mg Oral TID PRN Marlou Sa, NP       propranolol (INDERAL) tablet 10 mg  10 mg Oral Q8H PRN Princess Bruins, DO   10 mg at 01/31/24 0944   sertraline (ZOLOFT) tablet 50 mg  50 mg Oral Mignon Pine, Raynelle Fanning, DO   50 mg at 02/01/24 0800   thiamine (VITAMIN B1) tablet 100 mg  100 mg Oral Mignon Pine, Raynelle Fanning, DO   100 mg at 02/01/24 1610   traZODone (DESYREL) tablet 50 mg  50 mg Oral QHS Princess Bruins, DO   50 mg at 01/31/24 2143   valACYclovir (VALTREX) tablet 1,000 mg  1,000 mg Oral BID Kizzie Ide B, MD   1,000 mg at 02/01/24 0930   Current Outpatient Medications  Medication Sig Dispense Refill   gabapentin (NEURONTIN) 100 MG capsule Take 2 capsules (200 mg total) by mouth 3 (three) times daily. 180 capsule 0   naltrexone (DEPADE) 50 MG tablet Take 1 tablet (50 mg total) by mouth at bedtime. 30 tablet 0   propranolol (INDERAL) 10 MG tablet Take 1 tablet (10 mg total) by mouth every 8 (eight) hours as needed (anxiety). 30 tablet 0   [START ON 02/02/2024] sertraline (ZOLOFT) 50 MG tablet Take 1 tablet (50 mg total) by mouth every morning. 30 tablet 0   traZODone (DESYREL) 50 MG tablet Take 1 tablet (50 mg total) by mouth at bedtime. 30 tablet 0   valACYclovir (VALTREX) 1000 MG tablet Take 1 tablet (1,000 mg total) by mouth 2 (two) times daily for 6 days.      PTA Medications:  Facility Ordered Medications  Medication   [EXPIRED] hydrOXYzine (ATARAX) tablet 25 mg   [EXPIRED] loperamide (IMODIUM) capsule 2-4 mg   [EXPIRED] LORazepam (ATIVAN) tablet 1 mg   acetaminophen (TYLENOL) tablet 650 mg   alum & mag hydroxide-simeth (MAALOX/MYLANTA) 200-200-20 MG/5ML suspension 30 mL   magnesium  hydroxide (MILK OF MAGNESIA) suspension 30 mL   OLANZapine zydis (ZYPREXA) disintegrating tablet 5 mg   OLANZapine (ZYPREXA) injection 5 mg   OLANZapine (ZYPREXA) injection 10 mg   [COMPLETED] LORazepam (ATIVAN) tablet 1 mg   Followed by   [COMPLETED] LORazepam (ATIVAN) tablet 1 mg   Followed by   [COMPLETED] LORazepam (ATIVAN) tablet 1 mg   Followed by   [COMPLETED] LORazepam (ATIVAN) tablet 1 mg   [EXPIRED] ondansetron (ZOFRAN-ODT) disintegrating tablet 4 mg   [COMPLETED] sertraline (ZOLOFT) tablet 25 mg   Followed by   sertraline (ZOLOFT) tablet 50 mg   gabapentin (NEURONTIN) capsule 200 mg   hydrOXYzine (ATARAX) tablet 25 mg   [COMPLETED] LORazepam (ATIVAN) tablet 0.5 mg   [COMPLETED] LORazepam (ATIVAN) tablet 1 mg   valACYclovir (VALTREX) tablet 1,000 mg   naltrexone (DEPADE) tablet 50 mg   propranolol (INDERAL) tablet 10 mg   thiamine (VITAMIN B1)  tablet 100 mg   multivitamin with minerals tablet 1 tablet   traZODone (DESYREL) tablet 50 mg   PTA Medications  Medication Sig   valACYclovir (VALTREX) 1000 MG tablet Take 1 tablet (1,000 mg total) by mouth 2 (two) times daily for 6 days.   propranolol (INDERAL) 10 MG tablet Take 1 tablet (10 mg total) by mouth every 8 (eight) hours as needed (anxiety).   [START ON 02/02/2024] sertraline (ZOLOFT) 50 MG tablet Take 1 tablet (50 mg total) by mouth every morning.   traZODone (DESYREL) 50 MG tablet Take 1 tablet (50 mg total) by mouth at bedtime.   naltrexone (DEPADE) 50 MG tablet Take 1 tablet (50 mg total) by mouth at bedtime.   gabapentin (NEURONTIN) 100 MG capsule Take 2 capsules (200 mg total) by mouth 3 (three) times daily.       01/26/2024   11:15 AM 01/23/2024    1:30 PM 01/23/2024   10:16 AM  Depression screen PHQ 2/9  Decreased Interest 2 2 2   Down, Depressed, Hopeless 1 1 2   PHQ - 2 Score 3 3 4   Altered sleeping 3 3 2   Tired, decreased energy 2 2 1   Change in appetite 2 2 1   Feeling bad or failure about yourself  2 2  1   Trouble concentrating 1 1 1   Moving slowly or fidgety/restless 1 1 1   Suicidal thoughts 0 0 1  PHQ-9 Score 14 14 12   Difficult doing work/chores  Very difficult Very difficult    Flowsheet Row ED from 01/23/2024 in [facility] ED from 01/22/2024 in [facility] ED from 12/24/2023 in [facility] Emergency Department at [facility]  C-SSRS RISK CATEGORY Low Risk Error: Q3, 4, or 5 should not be populated when Q2 is No No Risk       Musculoskeletal  Strength & Muscle Tone: within normal limits Gait & Station: normal Patient leans: N/A  Psychiatric Specialty Exam  Presentation  General Appearance:  Appropriate for Environment; Fairly Groomed  Eye Contact: Fair  Speech: Clear and Coherent; Normal Rate  Speech Volume: Normal  Handedness: Right   Mood and Affect  Mood: Euthymic  Affect: Congruent; Appropriate   Thought Process  Thought Processes: Coherent  Descriptions of Associations:Intact  Orientation:Full (Time, Place and Person)  Thought Content:Logical  Diagnosis of Schizophrenia or Schizoaffective disorder in past: No    Hallucinations:Hallucinations: None  Ideas of Reference:None  Suicidal Thoughts:Suicidal Thoughts: No  Homicidal Thoughts:Homicidal Thoughts: No   Sensorium  Memory: Remote Good  Judgment: Fair  Insight: Fair   Art therapist  Concentration: Good  Attention Span: Good  Recall: Good  Fund of Knowledge: Good  Language: Good   Psychomotor Activity  Psychomotor Activity: Psychomotor Activity: Normal   Assets  Assets: Communication Skills; Resilience; Social Support   Sleep  Sleep: Sleep: Good    Physical Exam  Physical Exam Vitals reviewed.  Constitutional:      Appearance: Normal appearance.  HENT:     Head: Normocephalic and atraumatic.  Cardiovascular:     Rate and Rhythm: Normal rate.  Pulmonary:     Effort:  Pulmonary effort is normal.  Neurological:     General: No focal deficit present.     Mental Status: He is alert and oriented to person, place, and time.    Review of Systems  Constitutional:  Negative for chills and fever.  Respiratory:  Negative for shortness of breath.   Cardiovascular:  Negative for  chest pain and palpitations.  Gastrointestinal:  Negative for nausea and vomiting.  Neurological:  Negative for headaches.   Blood pressure 104/76, pulse 60, temperature 98.4 F (36.9 C), temperature source Oral, resp. rate 16, SpO2 98%. There is no height or weight on file to calculate BMI.  Demographic Factors:  Male and Caucasian  Loss Factors: NA  Historical Factors: Impulsivity  Risk Reduction Factors:   Positive social support, Positive therapeutic relationship, and Positive coping skills or problem solving skills  Continued Clinical Symptoms:  Alcohol/Substance Abuse/Dependencies  Cognitive Features That Contribute To Risk:  Closed-mindedness    Suicide Risk:  Mild:  Suicidal ideation of limited frequency, intensity, duration, and specificity.  There are no identifiable plans, no associated intent, mild dysphoria and related symptoms, good self-control (both objective and subjective assessment), few other risk factors, and identifiable protective factors, including available and accessible social support.  Plan Of Care/Follow-up recommendations:  Activity as tolerated Regular diet Continue prescription medications See PCP for medical conditions  Disposition: [facility] on 4/8  Lance Muss, MD 02/01/2024, 3:01 PM

## 2024-02-01 NOTE — Group Note (Signed)
 Group Topic: Trust and Honesty  Group Date: 01/31/2024 Start Time: 1930 End Time: 2000 Facilitators: Hilma Favors, RN  Department: [facility]  Number of Participants: 3  Group Focus: acceptance Treatment Modality:  Behavior Modification Therapy Interventions utilized were confrontation Purpose: express feelings  Name: Trevor [name] Date of Birth: [birth year]  MR: [id number]    Level of Participation: n/a Quality of Participation: n/a Interactions with others: n/a Mood/Affect: n/a Triggers (if applicable): n/a Cognition: n/a Progress: n/a Response: n/a Plan: patient will be encouraged to attend groups  Patients Problems:  Patient Active Problem List   Diagnosis Date Noted   Moderate episode of recurrent major depressive disorder (HCC) 01/30/2024   GAD (generalized anxiety disorder) 01/30/2024   Transaminitis 06/18/2022   GERD (gastroesophageal reflux disease) 06/18/2022   Hypokalemia 06/18/2022   Chronic hepatitis C without hepatic coma (HCC) 06/28/2019   Psoriasis 06/07/2019   H/O withdrawal symptoms, alcohol, with delirium and seizures (HCC) 06/07/2019   Alcohol use disorder, severe, dependence (HCC) 06/03/2019   Obesity (BMI 30-39.9) 01/09/2016   HSV-2 (herpes simplex virus 2) infection

## 2024-02-01 NOTE — Discharge Planning (Signed)
 LCSW spoke with patient on this morning regarding disposition plans.  Patient reports he spoke with his dad over the weekend who was willing to take him to [facility] on tomorrow.  Patient reports father will be able to pick him up around 8:00 AM on tomorrow in order for him to retrieve toiletries before admission.  Patient provided LCSW with father's contact information [phone] Mr. Chuck.  LCSW attempted to contact patient's father to confirm transportation arrangements, however received no answer.  LCSW left voice message at 8:59 AM requesting phone call back.  No other needs were reported at this time by the patient.  LCSW will continue to follow and provide support to patient while on the unit. Updates to be provided as received.   Fernande Boyden, LCSW Clinical Social Worker [facility] [facility] Ph: [phone]

## 2024-02-01 NOTE — ED Notes (Signed)
 Pt  is in the Dayroom calm and  composed. Denies SI/HI AVH. NAD Respirations are even and unlabored.  Will monitor for safety.

## 2024-02-01 NOTE — ED Notes (Signed)
 Patient A&Ox4. Denies intent to harm self/others when asked. Denies A/VH. Patient denies any physical complaints when asked. No acute distress noted. Support and encouragement provided. Routine safety checks conducted according to facility protocol. Encouraged patient to notify staff if thoughts of harm toward self or others arise. Patient verbalize understanding and agreement. Will continue to monitor for safety.

## 2024-02-01 NOTE — Group Note (Signed)
 Group Topic: Communication  Group Date: 02/01/2024 Start Time: 0900 End Time: 1030 Facilitators: Prentice Docker, RN  Department: [facility]  Number of Participants: 9  Group Focus: communication Treatment Modality:  Individual Therapy Interventions utilized were patient education Purpose: increase insight  Name: Trevor [name] Date of Birth: [birth year]  MR: [id number]    Level of Participation: active Quality of Participation: cooperative Interactions with others: gave feedback Mood/Affect: appropriate Triggers (if applicable): none identified Cognition: coherent/clear Progress: Gaining insight Response: Pt verbalized understanding of all medication given Plan: patient will be encouraged to notify staff with any side effects of medication given  Patients Problems:  Patient Active Problem List   Diagnosis Date Noted   Moderate episode of recurrent major depressive disorder (HCC) 01/30/2024   GAD (generalized anxiety disorder) 01/30/2024   Transaminitis 06/18/2022   GERD (gastroesophageal reflux disease) 06/18/2022   Hypokalemia 06/18/2022   Chronic hepatitis C without hepatic coma (HCC) 06/28/2019   Psoriasis 06/07/2019   H/O withdrawal symptoms, alcohol, with delirium and seizures (HCC) 06/07/2019   Alcohol use disorder, severe, dependence (HCC) 06/03/2019   Obesity (BMI 30-39.9) 01/09/2016   HSV-2 (herpes simplex virus 2) infection

## 2024-02-01 NOTE — ED Notes (Signed)
 Patient is sleeping. Respirations equal and unlabored, skin warm and dry. No change in assessment or acuity. Routine safety checks conducted according to facility protocol. Will continue to monitor for safety.

## 2024-02-02 DIAGNOSIS — F331 Major depressive disorder, recurrent, moderate: Secondary | ICD-10-CM | POA: Diagnosis not present

## 2024-02-02 DIAGNOSIS — F1020 Alcohol dependence, uncomplicated: Secondary | ICD-10-CM | POA: Diagnosis not present

## 2024-02-02 DIAGNOSIS — F411 Generalized anxiety disorder: Secondary | ICD-10-CM | POA: Diagnosis not present

## 2024-02-02 DIAGNOSIS — A6002 Herpesviral infection of other male genital organs: Secondary | ICD-10-CM | POA: Diagnosis not present

## 2024-02-02 NOTE — ED Notes (Signed)
 Patient calm and composed sleeping in the bedroom NAD.  Respirations are even and unlabored. Will monitor for safety

## 2024-02-02 NOTE — ED Provider Notes (Signed)
 Stable. A&O x 4.  Discharging to [facility] via family member.    Pt provided with home medication and script.   Denies current SI plan and Intent.  Denies HI and A/V hallucinations.   All belongings returned to PT.  F/U instruction revied.  Pt verbalized understanding

## 2024-02-02 NOTE — Group Note (Signed)
 Group Topic: Healthy Self Image and Positive Change  Group Date: 02/01/2024 Start Time: 2015 End Time: 2030 Facilitators: Darin Engels  Department: [facility]  Number of Participants: 5  Group Focus: leisure skills and reminiscence Treatment Modality:  Leisure Development Interventions utilized were leisure development Purpose: express feelings, regain self-worth, and reinforce self-care  Name: Trevor [name] Date of Birth: 29-Jan-1981  MR: [id number]    Level of Participation: active Quality of Participation: attentive and cooperative Interactions with others: minimal and positive  Mood/Affect: appropriate and positive Triggers (if applicable): n/a Cognition: coherent/clear Progress: Gaining insight Response: pt is being discharged tomorrow, expressed his excitement to leave and begin a new journey  Plan: patient will be encouraged to continue attending groups  Patients Problems:  Patient Active Problem List   Diagnosis Date Noted   Moderate episode of recurrent major depressive disorder (HCC) 01/30/2024   GAD (generalized anxiety disorder) 01/30/2024   Transaminitis 06/18/2022   GERD (gastroesophageal reflux disease) 06/18/2022   Hypokalemia 06/18/2022   Chronic hepatitis C without hepatic coma (HCC) 06/28/2019   Psoriasis 06/07/2019   H/O withdrawal symptoms, alcohol, with delirium and seizures (HCC) 06/07/2019   Alcohol use disorder, severe, dependence (HCC) 06/03/2019   Obesity (BMI 30-39.9) 01/09/2016   HSV-2 (herpes simplex virus 2) infection

## 2024-02-08 NOTE — Consults (Signed)
 ------------------------------------------------------------------------------- Attestation signed by Ozell Fam, MD at 02/10/2024 11:06 PM I discussed, but did not see, the patient with the Resident. I reviewed the Resident's note and agree with the Resident's assessment and management as documented by the Resident in the note.    Electronically Signed by: Ozell Fam, MD, Attending Physician 02/10/2024 11:06 PM  -------------------------------------------------------------------------------  [facility] General Neurology Consult Service Initial Consult Note   Requesting Attending Physician:  Nat Cy Favors,* Service Requesting Consult: ED    Assessment and Plan     Trevor [name] is a 43 y.o. male with a PMHx of alcohol use currently admitted to alcohol rehab facility, anxiety, one previous reported seizure on whom I have been asked by Nat Cy Favors,* to consult for seizure-like event in the context of alcohol cessation one and a half weeks ago, with completion of ativan  therapy one week ago. CTH is reassuringly negative, laboratory workup is unremarkable, and the patient has returned to baseline. Reported semiology is more consistent with syncope than seizure, and the description of the reported event would not be consistent with anatomic evolution of a seizure. One week would be a prolonged time to withdrawal from ativan , and one and a half weeks would be an even longer time to withdrawal from alcohol, increasing likelihood that the reported event was not epileptic in nature    # Convulsive syncopal event in the setting of alcohol detoxificaiton Recommendations Return to alcohol detoxification facility Defer antiepileptic drug initiation If there is concern for alcohol withdrawal seizures in future, consider diazepam  or Chlordiazepoxide  taper. Not indicated at this time Seizure precautions to be provided at discharge Neurology Consult  Service will sign off. Please contact [facility] Neuro Consult Team (Neurology) via Epic Chat if further concerns/questions.     Patient was discussed with the Neurology Attending Physician, Dr. Fam, who is in agreement with the above plan.      HPI     Trevor [name] is a 43 y.o. male on whom I have been asked by Nat Cy Favors,* to consult for seizure-like event.  The patient reports that he was walking outside earlier today when he felt his vision started to darken around the edges, at which time he lowered himself to the ground.  He thinks he lost consciousness, but also reports that he remembers his bilateral arms and legs tensing up and shaking.  He thinks that bystanders activated EMS, but he does not remember who they were.  The patient reports that his last alcoholic beverage was about a week and a half ago, and he finished prophylactic Ativan  7 days ago.  He reports his only other seizure-like event was when he was admitted to [facility] for alcohol detoxification, at which time he had similar symptoms of vision darkening on the edges followed by convulsions, and he reports he was treated with Ativan  at that time.  He does not have any other history of seizures.  He reports he currently feels back to normal aside from sore muscles.  No Known Allergies  Past Medical History:  Diagnosis Date  . Addiction to drug    (CMD)   . Alcohol abuse   . Bipolar disorder    (CMD)   . Psychosis    (CMD)   . Withdrawal symptoms, alcohol    (CMD)     History reviewed. No pertinent surgical history.  Family History  Family history unknown: Yes    Social History   Socioeconomic History  . Marital status:  Divorced    Spouse name: None  . Number of children: None  . Years of education: None  . Highest education level: None  Occupational History  . None  Tobacco Use  . Smoking status: Never  . Smokeless tobacco: Never  Vaping Use  . Vaping status: Never Used  Substance and  Sexual Activity  . Alcohol use: Yes    Comment: 2 Fifths of Vodka  . Drug use: Never  . Sexual activity: None  Other Topics Concern  . None  Social History Narrative  . None   Social Drivers of Health   Food Insecurity: No Food Insecurity (01/23/2024)   Received from [facility]   Food vital sign   . Within the past 12 months, you worried that your food would run out before you got money to buy more: Never true   . Within the past 12 months, the food you bought just didn't last and you didn't have money to get more: Never true  Transportation Needs: No Transportation Needs (01/23/2024)   Received from [facility] - Transportation   . Lack of Transportation (Medical): No   . Lack of Transportation (Non-Medical): No  Safety: Not At Risk (01/23/2024)   Received from [facility]   Safety   . Within the last year, have you been afraid of your partner or ex-partner?: No   . Within the last year, have you been humiliated or emotionally abused in other ways by your partner or ex-partner?: No   . Within the last year, have you been kicked, hit, slapped, or otherwise physically hurt by your partner or ex-partner?: No   . Within the last year, have you been raped or forced to have any kind of sexual activity by your partner or ex-partner?: No  Living Situation: Not on file    Code Status: Full Code    ROS   A complete ROS was performed with pertinent positives/negatives noted in the HPI. The remainder of the ROS are negative.      Objective      Temp:  [97.9 F (36.6 C)] 97.9 F (36.6 C) Heart Rate:  [80-94] 85 Resp:  [10-22] 21 BP: (112-132)/(63-88) 117/81 No intake/output data recorded.  VITALS: Vitals: BP: 117/81, Temp: 97.9 F (36.6 C), Temp Source: Temporal, Heart Rate: 85, Resp: (!) 21, SpO2: 96 %;   GEN: No acute distress. HEENT: Normocephalic, atraumatic. Moist mucus membranes, no oral lesion noted. CV: Extremities warm and well perfused. PULM: Normal work  of breathing on room air. ABD: Non-distended MSK/SKIN: No obvious joint deformities or effusions. No skin lesions noted on limited exam.   NEURO: Mental Status Level Of Consciousness: alert Orientation: Oriented to person, place, time, situation Attention: Grossly intact, engages with examiner Language: Speech is fluent and grammatical. Naming is normal . Comprehension is normal to spoken language. Repetition is normal. Behavior: Appropriate to context   Cranial Nerves II: Tracks examiner finger. Counts fingers accurately in 4 quadrants each eye III, IV, VI: PERRL, 7mm bilaterally in a dark room, react briskly. EOMI V: Facial sensation symmetric and intact to light touch bilaterally. VII: Face symmetric with action at rest  VIII: Auditory acuity grossly intact IX, X: Palate elevates symmetrically XI: Head midline. Shoulders elevate with full strength bilaterally XII: Tongue protrudes in midline   Motor:   Right Left  Deltoid 5 5  Biceps 5 5  Triceps 5 5  Grip 5 5  Hip flexion 5 5  Knee flexion  5 5  Knee extension 5 5  Dorsiflexion 5 5  Plantar flexion 5 5  Pronator drift absent Tone and muscle bulk normal   DTRs:  3+ throughout   Sensory: Detects soft touch equally in all four extremities. No extinction to DSS   Coordination: Finger to nose and heel to shin intact bilaterally.   Gait: deferred      Diagnostic Studies   Lab Results  Component Value Date   WBC 10.50 02/08/2024   HGB 14.0 02/08/2024   HCT 40.8 (L) 02/08/2024   MCV 96.1 (H) 02/08/2024   PLT 297 02/08/2024    Results from last 7 days  Lab Units 02/08/24 2057  SODIUM mmol/L 139  POTASSIUM mmol/L 3.9  CHLORIDE mmol/L 110*  CO2 mmol/L 20*  BUN mg/dL 14  CREATININE mg/dL 9.15  GLUCOSE mg/dL 96  CALCIUM  mg/dL 8.1*    CT Head WO Contrast Result Date: 02/08/2024 No acute intracranial abnormality.            Electronically signed by:  Elsie Mathew Salt, MD Mon 02/08/2024 at 11:07 PM

## 2024-02-08 NOTE — ED Provider Notes (Signed)
 Transfer of Care Note I assumed care of Trevor [name] on 02/09/2024 at sign out from previous provider. Please refer to the original provider's note for additional information regarding the care of Trevor [name].   Briefly, Trevor [name] is a 42 y.o. male who presents today with:  The plan includes:  ED Course as of 02/09/24 1904  Mon Feb 08, 2024  2316 S- seizures in last few months Needs Neuro [RC]  2319 S- hx of alcoholism, alcohol withdrawal seizure concerns. Coming from [place], 2 witnessed tonic clonic seizures, last week 1.5 weeks ago. Previously on ativan  taper. Back to baseline on arrival, no tongue biting or urinary incontinence. Dizziness prodrome. First 1 min, 2nd 30 seconds. Workup reassuring. Consulted neuro for potential withdrawal vs unknown cause of seizures.  []  f/u neuro recs [BS]    ED Course User Index [BS] Morene Lynwood Barrio, DO [RC] Darina Glendia Axe, MD     Objective: I personally reassessed the patient: Patient resting comfortably in bed no acute distress, CIWA score 6.  Vital Signs:   Vitals:   02/08/24 2340 02/08/24 2345 02/08/24 2351 02/09/24 0015  BP: 118/73 120/67 114/62 111/68  BP Location:      Patient Position:      Pulse: 70 72 66 68  Resp: (!) 26 13 19  (!) 21  Temp:      TempSrc:      SpO2: 96% 97% 98% 93%    Additional MDM:   Based on patient presentation, history, evaluation, concern for unknown cause of seizure.  Patient last drink 2 weeks ago, completed Ativan  taper 1 week ago.  Initial concern for alcohol withdrawal, however this did not align with patient current drinking history, which is confirmed as patient is currently being treated in a detox facility.  Patient evaluated by neurology, lower suspicion for seizure activity, however cannot officially rule out.  Patient has remained stable throughout his time in the ED, has remained hemodynamically stable.  CIWA score has not elevated, patient has remained comfortable.  Patient  given seizure precautions, including restriction on driving for 6 months.  Will schedule follow-up with neurology to undergo outpatient EEG.  Discussed with patient recommendation to talk with the on staff physician at detox facility to discuss further management for concerns for alcohol withdrawal.  At this time, patient, neurology team are comfortable discharge.  Scheduled patient transport back to facility.  Patient hemodynamically stable, comfortable prior to transfer to detox facility.  At this time, I feel that the patient is medically cleared for discharge and have discussed this with my attending who agrees.  I discussed with the patient and/or family my overall assessment, including my physical exam, labs, imaging, other diagnostic tests, and therapeutics given.  All questions answered and understanding is expressed.  I have instructed to call PCP to establish an outpatient appointment after this ED visit, and necessary specialty follow up if needed. I gave strict return precautions to come back to the ED including fevers, chills, severe pain, worsening of symptoms, return of symptoms, new and concerning symptoms, inability to tolerate p.o. intake, among others.   1. Seizure    (CMD)      ED Disposition:  ED Disposition     ED Disposition  Discharge   Condition  Stable   Comment  --          The patient was seen, evaluated, and treated in conjunction with the attending physician, who voiced agreement in the care provided.  Note generated using  Dragon Medical illustrator and may contain dictation errors. Please contact me for any clarification or with any questions.   Electronically signed by:  Morene Lynwood Barrio, D.O. (PGY-1)  02/08/2024 10:57 PM

## 2024-02-08 NOTE — ED Provider Notes (Signed)
 ------------------------------------------------------------------------------- Attestation signed by Olam Macario Sar, MD at 02/12/2024  8:40 PM I performed a history and physical examination of Trevor [name] and discussed his management with the resident/midlevel provider.  I have personally performed my own medical decision making.  I have reviewed the note and agree with the history, physical, assessment, and plan of care, with the following exceptions: None   -------------------------------------------------------------------------------   History  Chief Complaint:  Seizures  HPI  This is a 43 y.o. male with past medical history of alcohol use, alcoholic withdrawal seizures who presents to the Emergency Department from detox facility for multiple seizures.  For seizure lasted approximately 2 minutes second seizure lasted approximately 30 seconds.  Unclear how far prior seizures were.  Patient received 4 mg of Ativan  prior to arrival.  No neurologic deficits on arrival.  Patient has no complaints at this time.  Patient states last alcoholic beverage was approximately a week and a half ago. Patient states last seizure was when he was on a Ativan  taper and his dose of been decreased over a year ago.  Patient is not on antiepileptic medications   Past Medical History:  Diagnosis Date  . Addiction to drug    (CMD)   . Alcohol abuse   . Bipolar disorder    (CMD)   . Psychosis    (CMD)   . Withdrawal symptoms, alcohol    (CMD)    History reviewed. No pertinent surgical history. ED Course - Medical Decision Making  I have reviewed the nursing documentation for past medical history, family history, and social history and agree.  This is a 43 y.o. male presenting for Seizures  as above.  ED COURSE: On arrival, patient is afebrile hemodynamically stable no respiratory distress or hypoxia. No neuro deficits  Initial differential considered (but not limited to): Seizure, intracranial  abnormality, electrolyte abnormalities, syncope with myoclonic jerks, arrhythmia,  Work-up, Interventions and Interval History: I interpreted the ECG. It reveals a sinus rhythm. The QTc, PR, and QRS are appropriate. There are no signs of acute ischemia or of significant electrical abnormalities. The ECG does not show a STEMI. There are no ST depressions. There are no T wave inversions. There is no evidence of a High-Grade Conduction Block. No significant abnormalities on CBC, CMP, magnesium  or TSH. CT head WO acute intracranial abnormality.   Consults:  Neurology Please see consult note for full details of assessment and recommendations -pending   Handoff: At the time of signout, the patients work-up had not yet been completed. Handoff was provided to Dr. Arlee , please see their note for additional treatment plan details.   This patient was staffed with Dr. Sar who supervised the visit and agreed with the plan of care.   Impression   1. Seizure    (CMD)     ED Disposition     None      Physical Exam  Physical Exam Vitals reviewed.  Constitutional:      General: He is not in acute distress.    Appearance: He is well-developed.  HENT:     Head: Normocephalic and atraumatic.     Mouth/Throat:     Mouth: Mucous membranes are moist.  Eyes:     Extraocular Movements: Extraocular movements intact.     Pupils: Pupils are equal, round, and reactive to light.  Cardiovascular:     Rate and Rhythm: Normal rate and regular rhythm.  Pulmonary:     Effort: Pulmonary effort is normal.  Breath sounds: Normal breath sounds.  Abdominal:     General: Abdomen is flat.     Palpations: Abdomen is soft.  Skin:    General: Skin is warm and dry.     Capillary Refill: Capillary refill takes less than 2 seconds.  Neurological:     General: No focal deficit present.     Mental Status: He is alert and oriented to person, place, and time.  Psychiatric:        Mood and Affect: Mood  is anxious.     Procedures    Review of Systems  Review of Systems Pertinent ROS per HPI and MDM-ED Course    The dictation software Dragon was used to construct this note.  Grammatical errors may be present.  Waddell Seats, DO PGY-3 Emergency Medicine

## 2024-02-08 NOTE — ED Notes (Signed)
 Patient requesting hydroxyzine  for anxiety, Skinner notified, medication to be ordered    [employer], RN 02/08/24 2308

## 2024-05-13 ENCOUNTER — Ambulatory Visit (HOSPITAL_COMMUNITY)
Admission: EM | Admit: 2024-05-13 | Discharge: 2024-05-13 | Disposition: A | Discharge status: Other Acute Inpatient Hospital | Attending: Family Medicine | Admitting: Family Medicine

## 2024-05-13 ENCOUNTER — Other Ambulatory Visit (HOSPITAL_COMMUNITY)
Admission: EM | Admit: 2024-05-13 | Discharge: 2024-05-20 | Disposition: A | Discharge status: Home / Self Care | Source: Home / Self Care | Attending: Psychiatry | Admitting: Psychiatry

## 2024-05-13 DIAGNOSIS — A6002 Herpesviral infection of other male genital organs: Secondary | ICD-10-CM | POA: Diagnosis not present

## 2024-05-13 DIAGNOSIS — Z87898 Personal history of other specified conditions: Secondary | ICD-10-CM

## 2024-05-13 DIAGNOSIS — F1029 Alcohol dependence with unspecified alcohol-induced disorder: Secondary | ICD-10-CM

## 2024-05-13 DIAGNOSIS — Z8659 Personal history of other mental and behavioral disorders: Secondary | ICD-10-CM | POA: Diagnosis not present

## 2024-05-13 DIAGNOSIS — Z79899 Other long term (current) drug therapy: Secondary | ICD-10-CM | POA: Insufficient documentation

## 2024-05-13 DIAGNOSIS — F1410 Cocaine abuse, uncomplicated: Secondary | ICD-10-CM

## 2024-05-13 DIAGNOSIS — F1020 Alcohol dependence, uncomplicated: Secondary | ICD-10-CM | POA: Diagnosis not present

## 2024-05-13 DIAGNOSIS — B182 Chronic viral hepatitis C: Secondary | ICD-10-CM | POA: Diagnosis not present

## 2024-05-13 DIAGNOSIS — F10239 Alcohol dependence with withdrawal, unspecified: Secondary | ICD-10-CM | POA: Insufficient documentation

## 2024-05-13 DIAGNOSIS — B009 Herpesviral infection, unspecified: Secondary | ICD-10-CM

## 2024-05-13 LAB — COMPREHENSIVE METABOLIC PANEL WITH GFR
ALT: 21 U/L (ref 0–44)
AST: 23 U/L (ref 15–41)
Albumin: 4.1 g/dL (ref 3.5–5.0)
Alkaline Phosphatase: 80 U/L (ref 38–126)
Anion gap: 11 (ref 5–15)
BUN: 8 mg/dL (ref 6–20)
CO2: 30 mmol/L (ref 22–32)
Calcium: 9 mg/dL (ref 8.9–10.3)
Chloride: 97 mmol/L — ABNORMAL LOW (ref 98–111)
Creatinine, Ser: 0.82 mg/dL (ref 0.61–1.24)
GFR, Estimated: >60 mL/min (ref >60)
Glucose, Bld: 103 mg/dL — ABNORMAL HIGH (ref 70–99)
Potassium: 3.9 mmol/L (ref 3.5–5.1)
Sodium: 138 mmol/L (ref 135–145)
Total Bilirubin: 0.8 mg/dL (ref 0.0–1.2)
Total Protein: 7.1 g/dL (ref 6.5–8.1)

## 2024-05-13 LAB — CBC WITH DIFFERENTIAL/PLATELET
Abs Immature Granulocytes: 0.04 K/uL (ref 0.00–0.07)
Basophils Absolute: 0 K/uL (ref 0.0–0.1)
Basophils Relative: 0 %
Eosinophils Absolute: 0 K/uL (ref 0.0–0.5)
Eosinophils Relative: 0 %
HCT: 48 % (ref 39.0–52.0)
Hemoglobin: 16.7 g/dL (ref 13.0–17.0)
Immature Granulocytes: 1 %
Lymphocytes Relative: 14 %
Lymphs Abs: 1.2 K/uL (ref 0.7–4.0)
MCH: 32.2 pg (ref 26.0–34.0)
MCHC: 34.8 g/dL (ref 30.0–36.0)
MCV: 92.5 fL (ref 80.0–100.0)
Monocytes Absolute: 0.4 K/uL (ref 0.1–1.0)
Monocytes Relative: 5 %
Neutro Abs: 6.6 K/uL (ref 1.7–7.7)
Neutrophils Relative %: 80 %
Platelets: 329 K/uL (ref 150–400)
RBC: 5.19 MIL/uL (ref 4.22–5.81)
RDW: 13.5 % (ref 11.5–15.5)
WBC: 8.3 K/uL (ref 4.0–10.5)
nRBC: 0 % (ref 0.0–0.2)

## 2024-05-13 LAB — POCT URINE DRUG SCREEN - MANUAL ENTRY (I-SCREEN)
POC Amphetamine UR: NOT DETECTED
POC Buprenorphine (BUP): NOT DETECTED
POC Cocaine UR: NOT DETECTED
POC Marijuana UR: NOT DETECTED
POC Methadone UR: NOT DETECTED
POC Methamphetamine UR: NOT DETECTED
POC Morphine: NOT DETECTED
POC Oxazepam (BZO): NOT DETECTED
POC Oxycodone UR: NOT DETECTED
POC Secobarbital (BAR): NOT DETECTED

## 2024-05-13 LAB — ETHANOL: Alcohol, Ethyl (B): 52 mg/dL — ABNORMAL HIGH (ref <15)

## 2024-05-13 LAB — MAGNESIUM: Magnesium: 1.7 mg/dL (ref 1.7–2.4)

## 2024-05-13 MED ORDER — ONDANSETRON 4 MG PO TBDP: 4.0000 mg | ORAL_TABLET | Freq: Four times a day (QID) | ORAL | Status: DC | PRN

## 2024-05-13 MED ORDER — LORAZEPAM 1 MG PO TABS: 1.0000 mg | ORAL_TABLET | Freq: Four times a day (QID) | ORAL | Status: DC | PRN

## 2024-05-13 MED ORDER — DIPHENHYDRAMINE HCL 50 MG PO CAPS: 50.0000 mg | ORAL_CAPSULE | Freq: Three times a day (TID) | ORAL | Status: DC | PRN

## 2024-05-13 MED ORDER — DIPHENHYDRAMINE HCL 50 MG/ML IJ SOLN: 50.0000 mg | Freq: Three times a day (TID) | INTRAMUSCULAR | Status: DC | PRN

## 2024-05-13 MED ORDER — ACETAMINOPHEN 325 MG PO TABS: 650.0000 mg | ORAL_TABLET | Freq: Four times a day (QID) | ORAL | Status: DC | PRN

## 2024-05-13 MED ORDER — MAGNESIUM HYDROXIDE 400 MG/5ML PO SUSP: 30.0000 mL | Freq: Every day | ORAL | Status: DC | PRN

## 2024-05-13 MED ORDER — THIAMINE HCL 100 MG/ML IJ SOLN
100.0000 mg | Freq: Once | INTRAMUSCULAR | Status: AC
  Administered 2024-05-13: 100 mg via INTRAMUSCULAR
  Filled 2024-05-13: qty 2

## 2024-05-13 MED ORDER — ADULT MULTIVITAMIN W/MINERALS CH
1.0000 | ORAL_TABLET | Freq: Every day | ORAL | Status: DC
  Administered 2024-05-13: 1 via ORAL
  Filled 2024-05-13: qty 1

## 2024-05-13 MED ORDER — LORAZEPAM 2 MG/ML IJ SOLN: 2.0000 mg | Freq: Three times a day (TID) | INTRAMUSCULAR | Status: DC | PRN

## 2024-05-13 MED ORDER — LOPERAMIDE HCL 2 MG PO CAPS: 2.0000 mg | ORAL_CAPSULE | ORAL | Status: DC | PRN

## 2024-05-13 MED ORDER — LORAZEPAM 1 MG PO TABS
1.0000 mg | ORAL_TABLET | Freq: Four times a day (QID) | ORAL | Status: AC
  Administered 2024-05-13 – 2024-05-14 (×3): 1 mg via ORAL
  Filled 2024-05-13 (×3): qty 1

## 2024-05-13 MED ORDER — HALOPERIDOL 5 MG PO TABS: 5.0000 mg | ORAL_TABLET | Freq: Three times a day (TID) | ORAL | Status: DC | PRN

## 2024-05-13 MED ORDER — HYDROXYZINE HCL 25 MG PO TABS: 25.0000 mg | ORAL_TABLET | Freq: Four times a day (QID) | ORAL | Status: DC | PRN

## 2024-05-13 MED ORDER — ALUM & MAG HYDROXIDE-SIMETH 200-200-20 MG/5ML PO SUSP: 30.0000 mL | ORAL | Status: DC | PRN

## 2024-05-13 MED ORDER — LORAZEPAM 1 MG PO TABS: 1.0000 mg | ORAL_TABLET | Freq: Three times a day (TID) | ORAL | Status: DC

## 2024-05-13 MED ORDER — LORAZEPAM 1 MG PO TABS: 1.0000 mg | ORAL_TABLET | Freq: Two times a day (BID) | ORAL | Status: DC

## 2024-05-13 MED ORDER — HALOPERIDOL LACTATE 5 MG/ML IJ SOLN: 10.0000 mg | Freq: Three times a day (TID) | INTRAMUSCULAR | Status: DC | PRN

## 2024-05-13 MED ORDER — LORAZEPAM 1 MG PO TABS
1.0000 mg | ORAL_TABLET | Freq: Every day | ORAL | Status: AC
  Administered 2024-05-17: 1 mg via ORAL
  Filled 2024-05-13: qty 1

## 2024-05-13 MED ORDER — ONDANSETRON 4 MG PO TBDP: 4.0000 mg | ORAL_TABLET | Freq: Four times a day (QID) | ORAL | Status: AC | PRN

## 2024-05-13 MED ORDER — HALOPERIDOL LACTATE 5 MG/ML IJ SOLN: 5.0000 mg | Freq: Three times a day (TID) | INTRAMUSCULAR | Status: DC | PRN

## 2024-05-13 MED ORDER — HYDROXYZINE HCL 25 MG PO TABS
25.0000 mg | ORAL_TABLET | Freq: Four times a day (QID) | ORAL | Status: AC | PRN
  Administered 2024-05-13 – 2024-05-15 (×3): 25 mg via ORAL
  Filled 2024-05-13 (×3): qty 1

## 2024-05-13 MED ORDER — SERTRALINE HCL 50 MG PO TABS
50.0000 mg | ORAL_TABLET | Freq: Every day | ORAL | Status: DC
  Administered 2024-05-13 – 2024-05-16 (×4): 50 mg via ORAL
  Filled 2024-05-13 (×4): qty 1

## 2024-05-13 MED ORDER — LORAZEPAM 1 MG PO TABS
1.0000 mg | ORAL_TABLET | Freq: Four times a day (QID) | ORAL | Status: AC | PRN
  Administered 2024-05-15: 1 mg via ORAL

## 2024-05-13 MED ORDER — LOPERAMIDE HCL 2 MG PO CAPS: 2.0000 mg | ORAL_CAPSULE | ORAL | Status: AC | PRN

## 2024-05-13 MED ORDER — THIAMINE MONONITRATE 100 MG PO TABS: 100.0000 mg | ORAL_TABLET | Freq: Every day | ORAL | Status: DC

## 2024-05-13 MED ORDER — LORAZEPAM 1 MG PO TABS: 1.0000 mg | ORAL_TABLET | Freq: Every day | ORAL | Status: DC

## 2024-05-13 MED ORDER — THIAMINE MONONITRATE 100 MG PO TABS
100.0000 mg | ORAL_TABLET | Freq: Every day | ORAL | Status: DC
  Administered 2024-05-14 – 2024-05-20 (×7): 100 mg via ORAL
  Filled 2024-05-13 (×7): qty 1

## 2024-05-13 MED ORDER — LORAZEPAM 1 MG PO TABS
1.0000 mg | ORAL_TABLET | Freq: Three times a day (TID) | ORAL | Status: AC
  Administered 2024-05-14 – 2024-05-15 (×3): 1 mg via ORAL
  Filled 2024-05-13 (×3): qty 1

## 2024-05-13 MED ORDER — ADULT MULTIVITAMIN W/MINERALS CH
1.0000 | ORAL_TABLET | Freq: Every day | ORAL | Status: DC
  Administered 2024-05-14 – 2024-05-17 (×4): 1 via ORAL
  Filled 2024-05-13 (×4): qty 1

## 2024-05-13 MED ORDER — LORAZEPAM 1 MG PO TABS
1.0000 mg | ORAL_TABLET | Freq: Four times a day (QID) | ORAL | Status: DC
  Administered 2024-05-13: 1 mg via ORAL
  Filled 2024-05-13: qty 1

## 2024-05-13 MED ORDER — LORAZEPAM 1 MG PO TABS
1.0000 mg | ORAL_TABLET | Freq: Two times a day (BID) | ORAL | Status: AC
  Administered 2024-05-16: 1 mg via ORAL
  Filled 2024-05-13 (×2): qty 1

## 2024-05-13 NOTE — ED Notes (Signed)
 Pt provided dinner.

## 2024-05-13 NOTE — Progress Notes (Signed)
   05/13/24 1322  [facility] Triage Screening (Walk-ins at [facility] only)  How Did You Hear About Us ? Self  What Is the Reason for Your Visit/Call Today? Patient is a 43 year old male that presents this date as a voluntary walk in to [facility] requesting assistance with ongoing substance abuse issues. Patient denies any SI, HI or AVH. Patient reports he consumes up to 24, mini bottles, of liquor daily with last use yesterday when patient stated he drank, about 20 mini bottles, of liquor. Patient also reports doing 1 to 2 grams of cocaine 3 to 4 times a week with last use in the last 48 hours when patient reported he, did a gram. Patient states he last was in residential treatment 4 months ago at [facility] where he participated in a 28 day program and maintained his sobriety about a month before relapsing. Patient has limited support in the community and is unemployed. Patient states he is currently residing in a local motel since his girlfriend of several months broke up with him two weeks ago. Patient is currently experiencing withdrawals to include: nausea, tremors and feeling cold all over.  How Long Has This Been Causing You Problems? > than 6 months  Have You Recently Had Any Thoughts About Hurting Yourself? No  Are You Planning to Commit Suicide/Harm Yourself At This time? No  Have you Recently Had Thoughts About Hurting Someone Sherral? No  Are You Planning To Harm Someone At This Time? No  Physical Abuse Denies  Verbal Abuse Denies  Sexual Abuse Denies  Exploitation of patient/patient's resources Denies  Self-Neglect Denies  Possible abuse reported to: Other (Comment) (NA)  Are you currently experiencing any auditory, visual or other hallucinations? No  Have You Used Any Alcohol or Drugs in the Past 24 Hours? Yes  What Did You Use and How Much? Pt states he had 24 mini bottles of alcohol yesterday  Do you have any current medical co-morbidities that require immediate attention? No  Clinician description  of patient physical appearance/behavior: Patient is cooperative  What Do You Feel Would Help You the Most Today? Alcohol or Drug Use Treatment  If access to [facility] was not available, would you have sought care in the Emergency Department? No  Determination of Need Urgent (48 hours)  Options For Referral Inpatient Hospitalization  Determination of Need filed? Yes

## 2024-05-13 NOTE — ED Notes (Signed)
 Patient admitted to [facility] for detox from ETOH.  Patient walked into facility seeking treatment.  He denies avh shi or plan however appears sad with dysphoric affect.  Patient was cooperative with admission process.  His ciwa is 4 at this time.  He is being considered for [facility] and awaiting orders for same.  He is resting now.  Will monitor.

## 2024-05-13 NOTE — BH Assessment (Signed)
 Comprehensive Clinical Assessment (CCA) Note  05/13/2024 Trevor [name] [id number]  DISPOSITION: Patient has been recommended for observation in continuous assessment as they await a inpatient admission as bed placement is investigated.   The patient demonstrates the following risk factors for suicide: Chronic risk factors for suicide include: N/A. Acute risk factors for suicide include: N/A. Protective factors for this patient include: coping skills. Considering these factors, the overall suicide risk at this point appears to be low. Patient is not appropriate for outpatient follow up.   Patient is a 43 year old male that presents this date as a voluntary walk in requesting assistance with ongoing substance abuse issues. Patient denies any SI, HI or AVH. Patient denies any current OP services associated with mental health although states he has been prescribed Zoloft  (see MAR) that he has refills on that was given to him the last time he was in a residential treatment program while at [facility] 4 months ago. Patient states he was discharged with that medication after completing a 28 day treatment program. Patient denies having a current OP therapist. He reports mental health diagnosis of GAD and is currently prescribed Zoloft  50 mg daily with last dose on yesterday  Patient states he was sober for  3 months and experienced a return to use at the end of June triggered by a recent relationship that was terminated after he, started dating a new girl, for the last month. Patient reports he has been drinking 24 airplane bottles of liquor daily for the last two months with last use at midnight when he states he drank, about 20 mini-bottles of liquor, during the course of the last 24 hours. He also endorses use of cocaine of 1-2 grams 3 to 4 times a week for the last month with last use in the last 48 hours when patient reported he, did a gram. He denies other substance use.   Patient also was admitted  in April of 2025 for alcohol related issues and was prescribed naltrexone  during that stay, however, states his last dose was over 2 months ago. Patient states he discontinued naltrexone  and I don't have a good reason why. He states after he was discharged from [facility], he states he went to live at [facility]. He states his alcohol use has impacted his past relationships and has, lost his employment due to alcohol issues also. 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 He is seeking substance use treatment today. He denies suicidal or homicidal ideation, intent or plan. He denies AVH. Per NP note: based on the patient's history of heavy alcohol use, reported symptoms consistent with alcohol withdrawal (tremors, nausea, anxiety, headache), and clinical risk factors including previous alcohol-related seizures, inpatient hospitalization is recommended for supervised alcohol detox.  Patient is alert and oriented x 5. Patient speaks in a normal voice with clear tone and volume. Patient's memory appears to be intact with thoughts organized. Patient's mood is depressed with affect congruent. Patient does not appear to be responding to internal stimuli. Patient reports current withdrawals to include tremors, nausea and, feeling cold all over. Patient denies any current legal charges or access to firearms.          Chief Complaint: No chief complaint on file.  Visit Diagnosis: Substance induced mood disorder     CCA Screening, Triage and Referral (STR)  Patient Reported Information How did you hear about us ? Self  What  Is the Reason for Your Visit/Call Today? Patient is a 43 year old male that presents this date as a voluntary walk in to [facility] requesting assistance with ongoing substance abuse issues. Patient denies any SI, HI or AVH. Patient reports he consumes up to 24, mini bottles, of liquor daily with last use yesterday when  patient stated he drank, about 20 mini bottles, of liquor.  How Long Has This Been Causing You Problems? > than 6 months  What Do You Feel Would Help You the Most Today? Alcohol or Drug Use Treatment   Have You Recently Had Any Thoughts About Hurting Yourself? No  Are You Planning to Commit Suicide/Harm Yourself At This time? No   Flowsheet Row ED from 05/13/2024 in [facility] ED from 01/23/2024 in [facility] ED from 01/22/2024 in [facility]  C-SSRS RISK CATEGORY No Risk Low Risk Error: Q3, 4, or 5 should not be populated when Q2 is No    Have you Recently Had Thoughts About Hurting Someone [place]? No  Are You Planning to Harm Someone at This Time? No  Explanation: NA   Have You Used Any Alcohol or Drugs in the Past 24 Hours? Yes  How Long Ago Did You Use Drugs or Alcohol? Pt states he drank 20 mini bottles of liquor yesterday on 7/17  What Did You Use and How Much? Pt states he drank 20 mini bottles of liquor yesterday on 7/17   Do You Currently Have a Therapist/Psychiatrist? No  Name of Therapist/Psychiatrist: NA   Have You Been Recently Discharged From Any Office Practice or Programs? No  Explanation of Discharge From Practice/Program: NA    CCA Screening Triage Referral Assessment Type of Contact: Face-to-Face  Telemedicine Service Delivery: face to face   Is this Initial or Reassessment? initial  Date Telepsych consult ordered in [facility]:   05/13/2024 Time Telepsych consult ordered in [facility]:  05/13/2024  Location of Assessment: [facility] Assessment Services  Provider Location: [facility] Assessment Services   Collateral Involvement: None at this time   Does Patient Have a Automotive engineer Guardian? No  Legal Guardian Contact Information: NA  Copy of Legal Guardianship Form: -- (NA)  Legal Guardian Notified of Arrival: -- (NA)  Legal Guardian Notified of Pending  Discharge: -- (NA)  If Minor and Not Living with Parent(s), Who has Custody? NA  Is CPS involved or ever been involved? Never  Is APS involved or ever been involved? Never   Patient Determined To Be At Risk for Harm To Self or Others Based on Review of Patient Reported Information or Presenting Complaint? No  Method: No Plan  Availability of Means: No access or NA  Intent: Vague intent or NA  Notification Required: No need or identified person  Additional Information for Danger to Others Potential: -- (NA)  Additional Comments for Danger to Others Potential: None noted  Are There Guns or Other Weapons in Your Home? No  Types of Guns/Weapons: NA  Are These Weapons Safely Secured?                            -- (NA)  Who Could Verify You Are Able To Have These Secured: NA  Do You Have any Outstanding Charges, Pending Court Dates, Parole/Probation? Patient denies  Contacted To Inform of Risk of Harm To Self or Others: Other: Comment (NA)    Does Patient Present  under Involuntary Commitment? No    [place] of Residence: [place]   Patient Currently Receiving the Following Services: Not Receiving Services   Determination of Need: Urgent (48 hours)   Options For Referral: Facility-Based Crisis     CCA Biopsychosocial Patient Reported Schizophrenia/Schizoaffective Diagnosis in Past: No   Strengths: Patient is willing to participate in treatment   Mental Health Symptoms Depression:  Change in energy/activity; Difficulty Concentrating; Fatigue; Tearfulness; Irritability (difficulty sustaining sleep. Comes after his alcohol binges)   Duration of Depressive symptoms: Duration of Depressive Symptoms: Greater than two weeks   Mania:  None   Anxiety:   Irritability; Difficulty concentrating; Worrying (thinks restlessness may come from ADHD. Was dx with ADHD in grade school.)   Psychosis:  None   Duration of Psychotic symptoms:    Trauma:  None   Obsessions:   None   Compulsions:  None   Inattention:  None   Hyperactivity/Impulsivity:  None   Oppositional/Defiant Behaviors:  None   Emotional Irregularity:  None   Other Mood/Personality Symptoms:  None noted    Mental Status Exam Appearance and self-care  Stature:  Average   Weight:  Average weight   Clothing:  Casual   Grooming:  Normal   Cosmetic use:  None   Posture/gait:  Tense   Motor activity:  Restless   Sensorium  Attention:  Distractible   Concentration:  Normal   Orientation:  X5   Recall/memory:  Normal   Affect and Mood  Affect:  Anxious   Mood:  Depressed; Anxious   Relating  Eye contact:  Normal   Facial expression:  Responsive   Attitude toward examiner:  Cooperative   Thought and Language  Speech flow: Clear and Coherent   Thought content:  Appropriate to Mood and Circumstances   Preoccupation:  None   Hallucinations:  None   Organization:  Intact; Goal-directed   Company secretary of Knowledge:  Average   Intelligence:  Average   Abstraction:  Functional   Judgement:  Fair   Reality Testing:  Adequate   Insight:  Fair   Decision Making:  Normal   Social Functioning  Social Maturity:  Responsible   Social Judgement:  Normal   Stress  Stressors:  Relationship; Work   Coping Ability:  Normal   Skill Deficits:  None   Supports:  Family; Friends/Service system     Religion: Religion/Spirituality Are You A Religious Person?: Yes What is Your Religious Affiliation?: Christian How Might This Affect Treatment?: NA  Leisure/Recreation: Leisure / Recreation Do You Have Hobbies?: No  Exercise/Diet: Exercise/Diet Do You Exercise?: No Have You Gained or Lost A Significant Amount of Weight in the Past Six Months?: No Do You Follow a Special Diet?: No Do You Have Any Trouble Sleeping?: Yes Explanation of Sleeping Difficulties: Pt states in the last week he has only been sleeping about 3 to 4 hours a  night   CCA Employment/Education Employment/Work Situation: Employment / Work Situation Employment Situation: Unemployed Patient's Job has Been Impacted by Current Illness: Yes Describe how Patient's Job has Been Impacted: Pt states he lost more than one job due to his alcohol use Has Patient ever Been in the [employer]?: No  Education: Education Is Patient Currently Attending School?: No Last Grade Completed: 12 Did You Product manager?: No Did You Have An Individualized Education Program (IIEP): No Did You Have Any Difficulty At School?: No Patient's Education Has Been Impacted by Current Illness: No   CCA Family/Childhood History  Family and Relationship History: Family history Marital status: Single Does patient have children?: Yes How many children?: 1 How is patient's relationship with their children?: Postive relationship  Childhood History:  Childhood History By whom was/is the patient raised?: Both parents Did patient suffer any verbal/emotional/physical/sexual abuse as a child?: No Did patient suffer from severe childhood neglect?: No Has patient ever been sexually abused/assaulted/raped as an adolescent or adult?: No Was the patient ever a victim of a crime or a disaster?: No Witnessed domestic violence?: No Has patient been affected by domestic violence as an adult?: No       CCA Substance Use Alcohol/Drug Use: Alcohol / Drug Use Pain Medications: See MAR Prescriptions: See MAR Over the Counter: See MAR History of alcohol / drug use?: Yes Longest period of sobriety (when/how long): 3 months Negative Consequences of Use: Financial, Legal, Personal relationships, Work / School Withdrawal Symptoms: Nausea / Vomiting, Agitation, Sweats, Tremors, Tingling                         ASAM's:  Six Dimensions of Multidimensional Assessment  Dimension 1:  Acute Intoxication and/or Withdrawal Potential:   Dimension 1:  Description of individual's past  and current experiences of substance use and withdrawal: past withdrawal sx: heavy tremors, cognitive issues  Dimension 2:  Biomedical Conditions and Complications:   Dimension 2:  Description of patient's biomedical conditions and  complications: none  Dimension 3:  Emotional, Behavioral, or Cognitive Conditions and Complications:  Dimension 3:  Description of emotional, behavioral, or cognitive conditions and complications: some depressive and anxiety sx  Dimension 4:  Readiness to Change:  Dimension 4:  Description of Readiness to Change criteria: reports he is absolutely reading to make long term changes  Dimension 5:  Relapse, Continued use, or Continued Problem Potential:  Dimension 5:  Relapse, continued use, or continued problem potential critiera description: began drinking at 12 with 9 months of sobriety until this recent inpt hospitalization  Dimension 6:  Recovery/Living Environment:  Dimension 6:  Recovery/Iiving environment criteria description: Pt lacks a supportive environment  ASAM Severity Score: ASAM's Severity Rating Score: 7  ASAM Recommended Level of Treatment: ASAM Recommended Level of Treatment: Level I Outpatient Treatment   Substance use Disorder (SUD) Substance Use Disorder (SUD)  Checklist Symptoms of Substance Use: Continued use despite having a persistent/recurrent physical/psychological problem caused/exacerbated by use, Continued use despite persistent or recurrent social, interpersonal problems, caused or exacerbated by use, Evidence of tolerance, Large amounts of time spent to obtain, use or recover from the substance(s), Persistent desire or unsuccessful efforts to cut down or control use  Recommendations for Services/Supports/Treatments: Recommendations for Services/Supports/Treatments Recommendations For Services/Supports/Treatments: Facility Based Crisis  Disposition Recommendation per psychiatric provider: We recommend inpatient psychiatric hospitalization  when medically cleared. Patient is under voluntary admission status at this time; please IVC if attempts to leave hospital.   DSM5 Diagnoses: Patient Active Problem List   Diagnosis Date Noted   Moderate episode of recurrent major depressive disorder (HCC) 01/30/2024   GAD (generalized anxiety disorder) 01/30/2024   Transaminitis 06/18/2022   GERD (gastroesophageal reflux disease) 06/18/2022   Hypokalemia 06/18/2022   Chronic hepatitis C without hepatic coma (HCC) 06/28/2019   Psoriasis 06/07/2019   H/O withdrawal symptoms, alcohol, with delirium and seizures (HCC) 06/07/2019   Alcohol use disorder, severe, dependence (HCC) 06/03/2019   Obesity (BMI 30-39.9) 01/09/2016   HSV-2 (herpes simplex virus 2) infection      Referrals to Alternative  Service(s): Referred to Alternative Service(s):   Place:   Date:   Time:    Referred to Alternative Service(s):   Place:   Date:   Time:    Referred to Alternative Service(s):   Place:   Date:   Time:    Referred to Alternative Service(s):   Place:   Date:   Time:     Alm LITTIE Furth, LCAS

## 2024-05-13 NOTE — Group Note (Signed)
 Group Topic: Change and Accountability  Group Date: 05/13/2024 Start Time: 1930 End Time: 2000 Facilitators: Anice Benton LABOR, NT  Department: [facility]  Number of Participants: 2  Group Focus: Abuse Issues Treatment Modality:  Behavior Modification Therapy Interventions utilized were problem solving Purpose: trigger / craving management  Name: Trevor [name] Date of Birth: [birth year]  MR: [id number]    Level of Participation: Did Not Attend  Quality of Participation: N/A Interactions with others: N/A Mood/Affect: N/A Triggers (if applicable): N/A Cognition: N/A Progress: N/A Response: N/A Plan: patient will be encouraged to attend group  Patients Problems:  Patient Active Problem List   Diagnosis Date Noted   Moderate episode of recurrent major depressive disorder (HCC) 01/30/2024   GAD (generalized anxiety disorder) 01/30/2024   Transaminitis 06/18/2022   GERD (gastroesophageal reflux disease) 06/18/2022   Hypokalemia 06/18/2022   Chronic hepatitis C without hepatic coma (HCC) 06/28/2019   Psoriasis 06/07/2019   H/O withdrawal symptoms, alcohol, with delirium and seizures (HCC) 06/07/2019   Alcohol use disorder, severe, dependence (HCC) 06/03/2019   Obesity (BMI 30-39.9) 01/09/2016   HSV-2 (herpes simplex virus 2) infection

## 2024-05-13 NOTE — ED Notes (Signed)
 Patient transferred from [facility] to [facility] requesting assistance in alcohol detox. Calm, cooperative throughout interview process. Skin assessment completed. Oriented to unit. Meal and drink offered. Patient alert & oriented x4. Denies intent to harm self or others when asked. Denies A/VH. Patient denies any physical complaints when asked. No acute distress noted. Support and encouragement provided. Patient observed in milieu. No inappropriate behaviors seen or reported. Patient has on silver colored necklace and 2 rubber bracelets (1 blue and 1 white),. Per patient observation unit staff and security okayed him keeping these items. Writer informed patient that he will be responsible for keeping up with items and that staff is not responsible if items are misplaced, patient voiced understanding. Routine safety checks conducted per facility protocol. Encouraged patient to notify staff if any thoughts of harm towards self or others arise. Patient verbalizes understanding and agreement.

## 2024-05-13 NOTE — Discharge Instructions (Signed)
 Transfer to facility based crisis

## 2024-05-13 NOTE — ED Provider Notes (Signed)
 Behavioral Health Urgent Care Medical Screening Exam  Patient Name: Trevor [name] MRN: [id number] Date of Evaluation: 05/13/24 Chief Complaint: Alcohol withdrawal  Diagnosis:  Final diagnoses:  Alcohol use disorder, severe, dependence (HCC)    History of Present illness: Trevor [name] is a 43 y.o. male that presents this date as a voluntary walk in to [facility] requesting assistance with ongoing substance abuse issues. Patient denies any SI, HI or AVH. Patient reports he consumes up to 24, mini bottles, of liquor daily with last use yesterday when patient stated he drank, about 20 mini bottles, of liquor. Patient also reports doing 1 to 2 grams of cocaine 3 to 4 times a week with last use in the last 48 hours when patient reported he, did a gram. Patient states he last was in residential treatment 4 months ago at [facility] where he participated in a 28 day program and maintained his sobriety about a month before relapsing. Patient has limited support in the community and is unemployed. Patient states he is currently residing in a local motel since his girlfriend of several months broke up with him two weeks ago. Patient is currently experiencing withdrawals to include: nausea, tremors and feeling cold all over.    Chart reviewed with attending psychiatrist, Dr Kandi Hahn.    [name] is seen face-to-face in the [facility] treatment area. Patient is alert & oriented x 4 and engages in today's visit. Today, patient states he came to [facility] for treatment for alcohol withdrawal. He was at [facility] in April, 2025. Patient states he was sober for  3 months and experienced a return to use at the end of June triggered by started dating a new girl and started going out again. States he has been drinking 24 airplane bottles of liquor daily with last use at midnight. He also endorses use of cocaine of 1-2 grams/day with last use last week. He denies other substance use. He reports mental health diagnosis  of GAD and is currently prescribed Zoloft  50 mg daily with last dose on yesterday. He was prescribed naltrexone  during stay at [facility] in April, 2025, however, states his last dose was over 2 months ago. States he discontinued naltrexone  and I don't have a good reason why. He states after he was discharged from [facility], he states he went to live at [facility]. He states his alcohol use has impacted his relationship. States GF broke up with him 3 weeks ago after she saw the alcohol use. 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 He is seeking substance use treatment today. He denies suicidal or homicidal ideation, intent or plan. He denies AVH. Based on the patient's history of heavy alcohol use, reported symptoms consistent with alcohol withdrawal (tremors, nausea, anxiety, headache), and clinical risk factors including previous alcohol-related seizures, inpatient hospitalization is recommended for supervised alcohol detox.   Flowsheet Row ED from 05/13/2024 in [facility] ED from 01/23/2024 in [facility] ED from 01/22/2024 in [facility]  C-SSRS RISK CATEGORY No Risk Low Risk Error: Q3, 4, or 5 should not be populated when Q2 is No    Psychiatric Specialty Exam  Presentation  General Appearance:Appropriate for Environment; Fairly Groomed  Eye Contact:Good  Speech:Clear and Coherent; Normal Rate  Speech Volume:Normal  Handedness:Right   Mood and Affect  Mood: Depressed  Affect: Congruent   Thought Process  Thought Processes:  Coherent  Descriptions of Associations:Intact  Orientation:Full (Time, Place and Person)  Thought Content:Logical  Diagnosis of Schizophrenia or Schizoaffective disorder in past: No   Hallucinations:None  Ideas of Reference:None  Suicidal Thoughts:No  Homicidal Thoughts:No   Sensorium   Memory: Recent Good; Remote Fair  Judgment: Fair  Insight: Fair   Art therapist  Concentration: Fair  Attention Span: Fair  Recall: Good  Fund of Knowledge: Good  Language: Good   Psychomotor Activity  Psychomotor Activity: Tremor   Assets  Assets: Communication Skills; Desire for Improvement; Resilience; Physical Health   Sleep  Sleep: Poor  Number of hours:  5   Physical Exam: Vitals and nursing note reviewed.  HENT:     Head: Normocephalic.     Mouth/Throat:     Mouth: Mucous membranes are moist.  Cardiovascular:     Rate and Rhythm: Normal rate.  Pulmonary:     Effort: Pulmonary effort is normal.  Musculoskeletal:        General: Normal range of motion.  Skin:    General: Skin is warm and dry.  Neurological:     Mental Status: He is alert and oriented to person, place, and time.  Psychiatric:        Behavior: Behavior normal.        Thought Content: Thought content normal.     Review of Systems  Constitutional:  Negative for chills and fever.  HENT:  Negative for congestion.   Respiratory:  Negative for shortness of breath.   Cardiovascular:  Negative for chest pain and palpitations.  Neurological:  Positive for tremors.  Psychiatric/Behavioral:  Positive for depression and substance abuse. Negative for hallucinations and suicidal ideas. The patient is nervous/anxious.    Blood pressure (!) 136/94, pulse 90, temperature 97.7 F (36.5 C), temperature source Oral, resp. rate 16, SpO2 100%. There is no height or weight on file to calculate BMI.  Past Psychiatric History: GAD Hospitalization: [facility] (Jan, Feb, Mar 2025), [facility] 2025, [facility] (2021), a facility in [place], [place] (2018) Past medication trials: wellbutrin  (~2017-2020, lexapro  adjunct) lexapro  (maxed 2016), trazodone , vivitrol  , naltexone, currently on Zoloft  Past Medical History: History of Hep C (completed Epclusa ), ETOH related seizures (last seizure 4-5  months ago)   Family History: Mother- Skin Cancer Paternal Grandfather- Colon Cancer Paternal Grandmother and Grandfather- Lung Cancer   Family Psychiatric  History:  Brother- ETOH Abuse Maternal Grandfather and Grandmother- ETOH Abuse No Known History Suicides   Social History: Divorced, has a daughter. Recently terminated from his job. Started drinking at age 48, longest period of sobriety 6 months, been to Residential Rehab 4 times. Currently resides at [facility]  Musculoskeletal: Strength & Muscle Tone: within normal limits Gait & Station: normal Patient leans: N/A   [facility] MSE Discharge Disposition for Follow up and Recommendations: Based on my evaluation I certify that psychiatric inpatient services furnished can reasonably be expected to improve the patient's condition which I recommend transfer to an appropriate accepting facility.   Lab Orders         CBC with Differential/Platelet         Comprehensive metabolic panel         Magnesium          Ethanol         POCT Urine Drug Screen - (I-Screen)     EKG - QTc 424  Plan Admit to Facility Based Crisis [facility] detox protocol Agitation protocol Continue home medications: Zoloft  50 mg PO QAM  Sherrell Culver,  PMHNP-BC, FNP-BC  05/13/2024, 4:13 PM

## 2024-05-13 NOTE — ED Notes (Signed)
 PRN atarax given due to patient reports of anxiety. Medication administered with no complications. Environment secured, safety checks in place per facility policy.

## 2024-05-14 DIAGNOSIS — A6002 Herpesviral infection of other male genital organs: Secondary | ICD-10-CM | POA: Diagnosis not present

## 2024-05-14 DIAGNOSIS — B182 Chronic viral hepatitis C: Secondary | ICD-10-CM | POA: Diagnosis not present

## 2024-05-14 DIAGNOSIS — F1410 Cocaine abuse, uncomplicated: Secondary | ICD-10-CM | POA: Diagnosis not present

## 2024-05-14 DIAGNOSIS — F1020 Alcohol dependence, uncomplicated: Secondary | ICD-10-CM | POA: Diagnosis not present

## 2024-05-14 MED ORDER — TRAZODONE HCL 50 MG PO TABS
50.0000 mg | ORAL_TABLET | Freq: Every evening | ORAL | Status: DC | PRN
  Administered 2024-05-14 – 2024-05-16 (×3): 50 mg via ORAL
  Filled 2024-05-14 (×3): qty 1

## 2024-05-14 NOTE — Group Note (Signed)
 Group Topic: Emotional Regulation  Group Date: 05/14/2024 Start Time: 1710 End Time: 1740 Facilitators: Daved Tinnie HERO, RN  Department: [facility]  Number of Participants: 7  Group Focus: loss/grief issues Treatment Modality:  Psychoeducation Interventions utilized were exploration Purpose: increase insight  Name: Trevor [name] Date of Birth: Oct 12, 1981  MR: [id number]    Level of Participation: active Quality of Participation: attentive and cooperative Interactions with others: gave feedback Mood/Affect: appropriate Triggers (if applicable): n/a Cognition: coherent/clear Progress: Significant Response: pt says he will help navigate grieving by developing new habits Plan: patient will be encouraged to attend future RN education groups  Patients Problems:  Patient Active Problem List   Diagnosis Date Noted   Moderate episode of recurrent major depressive disorder (HCC) 01/30/2024   GAD (generalized anxiety disorder) 01/30/2024   Transaminitis 06/18/2022   GERD (gastroesophageal reflux disease) 06/18/2022   Hypokalemia 06/18/2022   Chronic hepatitis C without hepatic coma (HCC) 06/28/2019   Psoriasis 06/07/2019   H/O withdrawal symptoms, alcohol, with delirium and seizures (HCC) 06/07/2019   Alcohol use disorder, severe, dependence (HCC) 06/03/2019   Obesity (BMI 30-39.9) 01/09/2016   HSV-2 (herpes simplex virus 2) infection

## 2024-05-14 NOTE — ED Notes (Signed)
 Patient is sleeping. Respirations equal and unlabored. No change in assessment or acuity. Routine safety checks conducted according to facility protocol.

## 2024-05-14 NOTE — ED Notes (Signed)
 Pt denies si hi avh- verbal contract for safety provided. Pt denies discomforts and pain at this time.

## 2024-05-14 NOTE — ED Notes (Signed)
 Patient is sleeping. Respirations equal and unlabored, skin warm and dry. No change in assessment or acuity. Routine safety checks conducted according to facility protocol.

## 2024-05-14 NOTE — Group Note (Signed)
 Group Topic: Communication  Group Date: 05/14/2024 Start Time: 0800 End Time: 0830 Facilitators: Nena Lesley PARAS, NT  Department: [facility]  Number of Participants: 4  Group Focus: check in, community group, daily focus, feeling awareness/expression, and goals/reality orientation Treatment Modality:  Psychoeducation Interventions utilized were clarification, exploration, and patient education Purpose: increase insight  Name: Trevor [name] Date of Birth: 01-19-1981  MR: [id number]    Level of Participation: active Quality of Participation: engaged Interactions with others: gave feedback Mood/Affect: appropriate Triggers (if applicable): N/A Cognition: coherent/clear Progress: Gaining insight Response: PT voiced understanding to the [facility] guidelines. PT did not have any concerns at this time. Plan: patient will be encouraged to continue coming to groups. Patients Problems:  Patient Active Problem List   Diagnosis Date Noted   Moderate episode of recurrent major depressive disorder (HCC) 01/30/2024   GAD (generalized anxiety disorder) 01/30/2024   Transaminitis 06/18/2022   GERD (gastroesophageal reflux disease) 06/18/2022   Hypokalemia 06/18/2022   Chronic hepatitis C without hepatic coma (HCC) 06/28/2019   Psoriasis 06/07/2019   H/O withdrawal symptoms, alcohol, with delirium and seizures (HCC) 06/07/2019   Alcohol use disorder, severe, dependence (HCC) 06/03/2019   Obesity (BMI 30-39.9) 01/09/2016   HSV-2 (herpes simplex virus 2) infection

## 2024-05-14 NOTE — ED Provider Notes (Signed)
 Facility Based Crisis Admission H&P  Date: 05/14/24 Patient Name: Trevor [name] MRN: [id number] Chief Complaint: Substance abuse detox.  Diagnoses:  Final diagnoses:  Cocaine use disorder (HCC)  Alcohol dependence with unspecified alcohol-induced disorder (HCC)  History of alcohol withdrawal delirium  History of seizure due to alcohol withdrawal  Herpes simplex type 2 infection  Chronic hepatitis C without hepatic coma (HCC)   HPI: Trevor [name] is a 43 y.o. male with a history of alcohol use disorder who presented to the [facility] on 7/18 seeking detoxification from alcohol.  As per initial assessment:  Patient reports he consumes up to 24, mini bottles, of liquor daily with last use yesterday when patient stated he drank, about 20 mini bottles, of liquor. Patient also reports doing 1 to 2 grams of cocaine 3 to 4 times a week with last use in the last 48 hours when patient reported he, did a gram. Patient states he last was in residential treatment 4 months ago at [facility] where he participated in a 28 day program and maintained his sobriety about a month before relapsing. Rosalita, Sherrell BRAVO, NP, 05/13/2024).   Assessment and review of psychiatry symptoms: During encounter with patient, he presents with a depressed mood, he is able to confirm the events leading to this hospitalization.  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  Patient reports that alcohol is his only substance of abuse, and tells Clinical research associate that he currently drinks many bottles of liquor x 24 bottles per day, and has been drinking these for the past 1 month consecutively every day x one month now.  Patient reports that prior to relapsing, he had sustained his sobriety for 3 months.  He reports motivation to return  to rehab after his Ativan  taper is completed so that he can sustain his sobriety.  Patient reports that he has struggled with alcoholism for multiple years now, and has had intermittent periods of sobriety, but not lasting for very long.  Patient reports that in the past, he has been to rehabilitation centers including for logical.  [facility], [facility] and [facility]. He shares that he is not able to go to [facility] this time since he no longer has Nurse, learning disability. He shares that he is still interested in going, especially [facility]. Patient is denying any other substance use, including THC, cocaine, prescription medications, or any other substances not mentioned. Denies nicotine use as well.   Patient reports depressive symptoms including decreased interest in doing things that typically make him happy such as running, working out, states that he has been having trouble with the above.  He reports insomnia has also been an issue prior to presentation to this center, reports that he would typically drink on most nights of the week to be able to go to sleep.  He reports decreased motivation, decreased concentration, and feelings of hopelessness and helplessness.  Reports mental clouding, states that symptoms are all with persistent for at least the past 2 weeks prior to this hospitalization.  Patient denying suicidality, denies other depressive symptoms prior to this hospitalization.  Patient denies any history of suicide attempts, reports that he has no access to guns, has no way of obtaining them.  Reports a prior admission to this facility, and as per chart review, patient was hospitalized here in April  of this year.  Admission diagnoses for that encounter and listed as MDD, GAD, he is listed to have a history of seizures and DTs related to alcohol withdrawals.  It is listed during that visit, the patient has been to rehab at least 4 other times.  He was sent to [facility] at the time of discharge in April.  If patient is  being truthful, this means that he sustained his sobriety x 3 months, which will put him to early this month prior to relapsing.   Reports symptoms significant for GAD as follows: Restlessness, excessive worrying, muscle tension, states he has had these for at least the past 6 months.   Denies Psychosis currently, in the past, or most recently; specifically denies auditory, visual or tactile hallucinations. Denies paranoia and denies delusional thinking. There are no overt signs of psychosis.  Patient denies other mental health conditions, but as per chart review, has history of Hep C, genital herpes, and a history of alcohol related withdrawal seizures and Dts. He denies any signs and symptoms significant for bipolar 1/2, denies symptoms significant for OCD, ADD, denies emotional, physical or sexual abuse as a child or as an adult.  Denies head trauma, denies medical conditions.  Denies self-injurious behaviors, denies aggressive behaviors or fire-setting behaviors.  Patient reports that the only medications that he was taking prior to presenting to this hospital was Zoloft , started during his last stay at this facility.  Denies having a current mental health provider.  He reports that he still had the medication left, because [facility] filled the prescription for him during his stay.  Current Presentation: Pt with flat affect and depressed mood, attention to personal hygiene and grooming is poor, eye contact is fair, speech is clear & coherent. Thought contents are organized and logical, and pt currently denies SI/HI/AVH or paranoia. There is no evidence of delusional thoughts.  Patient is verbalizing motivation to regain and sustain his sobriety.  Social history: Patient reports that he is currently not working, is unemployed, was recently terminated from his job, but also reported during prior hospitalization that he was recently fired from his job. He reports that he lives by himself.  Has a 43-year-old  daughter who resides with her mother.  Has supportive family consisting of his parents and his brother here in [place].  History is copied from prior encounter as follows: Past Psychiatric History:  Dx: AUD, MDD, GAD Residential Rehab 4 times,  no history of Suicide Attempts, Self Injurious Behavior, or Psychiatric Hospitalizations.  Past rx: wellbutrin  (~2017-2020, lexapro  adjunct) lexapro  (maxed 2016), trazodone , vivitrol  Outpatient tx: No psychiatrist Therapy - Darice Simpler, LCAS at [facility]    Past Medical History:  EtOH withdrawal seizures and DT (see 06/23/2022 dc summary), required phenobarbital  and precedex  Prolonged Qtc Alcoholic hepatitis Chronic hep C HSV   Family History:  Mother- Skin Cancer Paternal Grandfather- Colon Cancer Paternal Grandmother and Grandfather- Lung Cancer   Family Psychiatric  History:  Brother- EtOH Abuse Maternal Grandfather and Grandmother- EtOH Abuse No Known Diagnosis' or Suicides   Social History:  Divorced, has a daughter. Recently fired from his job. Started drinking at age 21, longest period of sobriety 6 months, been to Residential Rehab 4 times.    PHQ 2-9:  Flowsheet Row ED from 01/23/2024 in [facility] ED from 01/22/2024 in [facility]  Thoughts that you would be better off dead, or of hurting yourself in some way Not at all Several  days  PHQ-9 Total Score 14 12    Flowsheet Row ED from 05/13/2024 in [facility] Most recent reading at 05/13/2024  4:39 PM ED from 05/13/2024 in [facility] Most recent reading at 05/13/2024  3:20 PM ED from 01/23/2024 in [facility] Most recent reading at 01/23/2024 11:16 AM  C-SSRS RISK CATEGORY No Risk No Risk Low Risk    Screenings    Flowsheet Row Most Recent Value  CIWA-Ar Total 6    Total Time spent with patient: 45 minutes  Musculoskeletal   Strength & Muscle Tone: within normal limits Gait & Station: normal Patient leans: N/A  Psychiatric Specialty Exam  Presentation General Appearance:  Disheveled  Eye Contact: Fair  Speech: Clear and Coherent  Speech Volume: Normal  Handedness: Right   Mood and Affect  Mood: Anxious; Depressed  Affect: Congruent   Thought Process  Thought Processes: Coherent  Descriptions of Associations:Intact  Orientation:Full (Time, Place and Person)  Thought Content:Logical  Diagnosis of Schizophrenia or Schizoaffective disorder in past: No   Hallucinations:Hallucinations: None  Ideas of Reference:None  Suicidal Thoughts:Suicidal Thoughts: No  Homicidal Thoughts:Homicidal Thoughts: No   Sensorium  Memory: Immediate Fair  Judgment: Fair  Insight: Fair   Art therapist  Concentration: Fair  Attention Span: Fair  Recall: Fair  Fund of Knowledge: Fair  Language: Good   Psychomotor Activity  Psychomotor Activity: Psychomotor Activity: Normal   Assets  Assets: Resilience   Sleep  Sleep: Sleep: Poor Number of Hours of Sleep: 5   Nutritional Assessment (For OBS and FBC admissions only) Has the patient had a weight loss or gain of 10 pounds or more in the last 3 months?: No Has the patient had a decrease in food intake/or appetite?: Yes Does the patient have dental problems?: No Does the patient have eating habits or behaviors that may be indicators of an eating disorder including binging or inducing vomiting?: No Has the patient recently lost weight without trying?: 0 Has the patient been eating poorly because of a decreased appetite?: 1 Malnutrition Screening Tool Score: 1    Physical Exam Vitals and nursing note reviewed.  HENT:     Head: Normocephalic.  Musculoskeletal:        General: Normal range of motion.    Review of Systems  Psychiatric/Behavioral:  Positive for depression and substance abuse. Negative for  hallucinations, memory loss and suicidal ideas. The patient is nervous/anxious and has insomnia.   All other systems reviewed and are negative.   Blood pressure (!) 125/91, pulse 87, temperature 98.9 F (37.2 C), temperature source Oral, resp. rate 17, SpO2 100%. There is no height or weight on file to calculate BMI.  Is the patient at risk to self? Yes -Denies SI, but alcoholism renders him at high risk of danger to self and others. Has the patient been a risk to self in the past 6 months? Yes .    Has the patient been a risk to self within the distant past? No   Is the patient a risk to others? Yes   Has the patient been a risk to others in the past 6 months? Yes   Has the patient been a risk to others within the distant past? No   Past Medical History: See above  Family History: See above  Social History: See above   Last Labs:  Admission on 05/13/2024, Discharged on 05/13/2024  Component Date Value Ref Range Status  WBC 05/13/2024 8.3  4.0 - 10.5 K/uL Final   RBC 05/13/2024 5.19  4.22 - 5.81 MIL/uL Final   Hemoglobin 05/13/2024 16.7  13.0 - 17.0 g/dL Final   HCT 92/81/7974 48.0  39.0 - 52.0 % Final   MCV 05/13/2024 92.5  80.0 - 100.0 fL Final   MCH 05/13/2024 32.2  26.0 - 34.0 pg Final   MCHC 05/13/2024 34.8  30.0 - 36.0 g/dL Final   RDW 92/81/7974 13.5  11.5 - 15.5 % Final   Platelets 05/13/2024 329  150 - 400 K/uL Final   nRBC 05/13/2024 0.0  0.0 - 0.2 % Final   Neutrophils Relative % 05/13/2024 80  % Final   Neutro Abs 05/13/2024 6.6  1.7 - 7.7 K/uL Final   Lymphocytes Relative 05/13/2024 14  % Final   Lymphs Abs 05/13/2024 1.2  0.7 - 4.0 K/uL Final   Monocytes Relative 05/13/2024 5  % Final   Monocytes Absolute 05/13/2024 0.4  0.1 - 1.0 K/uL Final   Eosinophils Relative 05/13/2024 0  % Final   Eosinophils Absolute 05/13/2024 0.0  0.0 - 0.5 K/uL Final   Basophils Relative 05/13/2024 0  % Final   Basophils Absolute 05/13/2024 0.0  0.0 - 0.1 K/uL Final   Immature  Granulocytes 05/13/2024 1  % Final   Abs Immature Granulocytes 05/13/2024 0.04  0.00 - 0.07 K/uL Final   Performed at [facility], [address], [place], [place] [address]   Sodium 05/13/2024 138  135 - 145 mmol/L Final   Potassium 05/13/2024 3.9  3.5 - 5.1 mmol/L Final   Chloride 05/13/2024 97 (L)  98 - 111 mmol/L Final   CO2 05/13/2024 30  22 - 32 mmol/L Final   Glucose, Bld 05/13/2024 103 (H)  70 - 99 mg/dL Final   Glucose reference range applies only to samples taken after fasting for at least 8 hours.   BUN 05/13/2024 8  6 - 20 mg/dL Final   Creatinine, Ser 05/13/2024 0.82  0.61 - 1.24 mg/dL Final   Calcium  05/13/2024 9.0  8.9 - 10.3 mg/dL Final   Total Protein 92/81/7974 7.1  6.5 - 8.1 g/dL Final   Albumin 92/81/7974 4.1  3.5 - 5.0 g/dL Final   AST 92/81/7974 23  15 - 41 U/L Final   ALT 05/13/2024 21  0 - 44 U/L Final   Alkaline Phosphatase 05/13/2024 80  38 - 126 U/L Final   Total Bilirubin 05/13/2024 0.8  0.0 - 1.2 mg/dL Final   GFR, Estimated 05/13/2024 >60  >60 mL/min Final   Comment: (NOTE) Calculated using the CKD-EPI Creatinine Equation (2021)    Anion gap 05/13/2024 11  5 - 15 Final   Performed at [facility], [address] [place], [place] [address]   Magnesium  05/13/2024 1.7  1.7 - 2.4 mg/dL Final   Performed at [facility], [address], [place], [place] [address]   Alcohol, Ethyl (B) 05/13/2024 52 (H)  <15 mg/dL Final   Comment: (NOTE) For medical purposes only. Performed at [facility], [address], [place], [place] [address]    POC Amphetamine UR 05/13/2024 None Detected  NONE DETECTED (Cut Off Level 1000 ng/mL) Final   POC Secobarbital (BAR) 05/13/2024 None Detected  NONE DETECTED (Cut Off Level 300 ng/mL) Final   POC Buprenorphine (BUP) 05/13/2024 None Detected  NONE DETECTED (Cut Off Level 10 ng/mL) Final   POC Oxazepam (BZO) 05/13/2024 None Detected  NONE DETECTED (  Cut Off Level 300 ng/mL) Final   POC Cocaine UR  05/13/2024 None Detected  NONE DETECTED (Cut Off Level 300 ng/mL) Final   POC Methamphetamine UR 05/13/2024 None Detected  NONE DETECTED (Cut Off Level 1000 ng/mL) Final   POC Morphine  05/13/2024 None Detected  NONE DETECTED (Cut Off Level 300 ng/mL) Final   POC Methadone UR 05/13/2024 None Detected  NONE DETECTED (Cut Off Level 300 ng/mL) Final   POC Oxycodone  UR 05/13/2024 None Detected  NONE DETECTED (Cut Off Level 100 ng/mL) Final   POC Marijuana UR 05/13/2024 None Detected  NONE DETECTED (Cut Off Level 50 ng/mL) Final  Admission on 01/23/2024, Discharged on 02/02/2024  Component Date Value Ref Range Status   Sodium 01/30/2024 137  135 - 145 mmol/L Final   Potassium 01/30/2024 4.1  3.5 - 5.1 mmol/L Final   Chloride 01/30/2024 101  98 - 111 mmol/L Final   CO2 01/30/2024 30  22 - 32 mmol/L Final   Glucose, Bld 01/30/2024 92  70 - 99 mg/dL Final   Glucose reference range applies only to samples taken after fasting for at least 8 hours.   BUN 01/30/2024 17  6 - 20 mg/dL Final   Creatinine, Ser 01/30/2024 1.03  0.61 - 1.24 mg/dL Final   Calcium  01/30/2024 8.9  8.9 - 10.3 mg/dL Final   Total Protein 95/94/7974 7.2  6.5 - 8.1 g/dL Final   Albumin 95/94/7974 4.1  3.5 - 5.0 g/dL Final   AST 95/94/7974 18  15 - 41 U/L Final   ALT 01/30/2024 19  0 - 44 U/L Final   Alkaline Phosphatase 01/30/2024 43  38 - 126 U/L Final   Total Bilirubin 01/30/2024 0.6  0.0 - 1.2 mg/dL Final   GFR, Estimated 01/30/2024 >60  >60 mL/min Final   Comment: (NOTE) Calculated using the CKD-EPI Creatinine Equation (2021)    Anion gap 01/30/2024 6  5 - 15 Final   Performed at [facility], [address], [place], [place] [address]  Admission on 01/22/2024, Discharged on 01/23/2024  Component Date Value Ref Range Status   WBC 01/22/2024 6.8  4.0 - 10.5 K/uL Final   RBC 01/22/2024 5.22  4.22 - 5.81 MIL/uL Final   Hemoglobin 01/22/2024 17.2 (H)  13.0 - 17.0 g/dL Final   HCT 96/71/7974 47.8  39.0 - 52.0 % Final    MCV 01/22/2024 91.6  80.0 - 100.0 fL Final   MCH 01/22/2024 33.0  26.0 - 34.0 pg Final   MCHC 01/22/2024 36.0  30.0 - 36.0 g/dL Final   RDW 96/71/7974 12.1  11.5 - 15.5 % Final   Platelets 01/22/2024 319  150 - 400 K/uL Final   nRBC 01/22/2024 0.0  0.0 - 0.2 % Final   Neutrophils Relative % 01/22/2024 70  % Final   Neutro Abs 01/22/2024 4.8  1.7 - 7.7 K/uL Final   Lymphocytes Relative 01/22/2024 25  % Final   Lymphs Abs 01/22/2024 1.7  0.7 - 4.0 K/uL Final   Monocytes Relative 01/22/2024 4  % Final   Monocytes Absolute 01/22/2024 0.3  0.1 - 1.0 K/uL Final   Eosinophils Relative 01/22/2024 0  % Final   Eosinophils Absolute 01/22/2024 0.0  0.0 - 0.5 K/uL Final   Basophils Relative 01/22/2024 1  % Final   Basophils Absolute 01/22/2024 0.1  0.0 - 0.1 K/uL Final   Immature Granulocytes 01/22/2024 0  % Final   Abs Immature Granulocytes 01/22/2024 0.02  0.00 - 0.07 K/uL Final  Performed at [facility], [address] [place], [place] [address]   Sodium 01/22/2024 138  135 - 145 mmol/L Final   Potassium 01/22/2024 3.9  3.5 - 5.1 mmol/L Final   Chloride 01/22/2024 99  98 - 111 mmol/L Final   CO2 01/22/2024 25  22 - 32 mmol/L Final   Glucose, Bld 01/22/2024 99  70 - 99 mg/dL Final   Glucose reference range applies only to samples taken after fasting for at least 8 hours.   BUN 01/22/2024 13  6 - 20 mg/dL Final   Creatinine, Ser 01/22/2024 0.83  0.61 - 1.24 mg/dL Final   Calcium  01/22/2024 8.9  8.9 - 10.3 mg/dL Final   Total Protein 96/71/7974 7.5  6.5 - 8.1 g/dL Final   Albumin 96/71/7974 4.6  3.5 - 5.0 g/dL Final   AST 96/71/7974 44 (H)  15 - 41 U/L Final   ALT 01/22/2024 46 (H)  0 - 44 U/L Final   Alkaline Phosphatase 01/22/2024 60  38 - 126 U/L Final   Total Bilirubin 01/22/2024 0.8  0.0 - 1.2 mg/dL Final   GFR, Estimated 01/22/2024 >60  >60 mL/min Final   Comment: (NOTE) Calculated using the CKD-EPI Creatinine Equation (2021)    Anion gap 01/22/2024 14  5 - 15 Final    Performed at [facility], [address] [place], [place] [address]   Hgb A1c MFr Bld 01/22/2024 5.0  4.8 - 5.6 % Final   Comment: (NOTE) Pre diabetes:          5.7%-6.4%  Diabetes:              >6.4%  Glycemic control for   <7.0% adults with diabetes    Mean Plasma Glucose 01/22/2024 96.8  mg/dL Final   Performed at [facility], [address], [place], [place] [address]   Magnesium  01/22/2024 1.9  1.7 - 2.4 mg/dL Final   Performed at [facility], [address] [place], [place] [address]   Alcohol, Ethyl (B) 01/22/2024 109 (H)  <10 mg/dL Final   Comment: (NOTE) Lowest detectable limit for serum alcohol is 10 mg/dL.  For medical purposes only. Performed at [facility], [address], [place], [place] [address]    Cholesterol 01/22/2024 202 (H)  0 - 200 mg/dL Final   Triglycerides 96/71/7974 65  <150 mg/dL Final   HDL 96/71/7974 84  >40 mg/dL Final   Total CHOL/HDL Ratio 01/22/2024 2.4  RATIO Final   VLDL 01/22/2024 13  0 - 40 mg/dL Final   LDL Cholesterol 01/22/2024 105 (H)  0 - 99 mg/dL Final   Comment:        Total Cholesterol/HDL:CHD Risk Coronary Heart Disease Risk Table                     Men   Women  1/2 Average Risk   3.4   3.3  Average Risk       5.0   4.4  2 X Average Risk   9.6   7.1  3 X Average Risk  23.4   11.0        Use the calculated Patient Ratio above and the CHD Risk Table to determine the patient's CHD Risk.        ATP III CLASSIFICATION (LDL):  <100     mg/dL   Optimal  899-870  mg/dL   Near or Above  Optimal  130-159  mg/dL   Borderline  839-810  mg/dL   High  >809     mg/dL   Very High Performed at [facility], [address], [place], [place] [address]    TSH 01/22/2024 0.458  0.350 - 4.500 uIU/mL Final   Comment: Performed by a 3rd Generation assay with a functional sensitivity of <=0.01 uIU/mL. Performed at [facility], [address] [place], [place] [address]    RPR Ser  Ql 01/22/2024 NON REACTIVE  NON REACTIVE Final   Performed at [facility], [address] [place], [place] [address]   Color, Urine 01/22/2024 YELLOW  YELLOW Final   APPearance 01/22/2024 CLEAR  CLEAR Final   Specific Gravity, Urine 01/22/2024 1.020  1.005 - 1.030 Final   pH 01/22/2024 5.0  5.0 - 8.0 Final   Glucose, UA 01/22/2024 NEGATIVE  NEGATIVE mg/dL Final   Hgb urine dipstick 01/22/2024 NEGATIVE  NEGATIVE Final   Bilirubin Urine 01/22/2024 NEGATIVE  NEGATIVE Final   Ketones, ur 01/22/2024 5 (A)  NEGATIVE mg/dL Final   Protein, ur 96/71/7974 NEGATIVE  NEGATIVE mg/dL Final   Nitrite 96/71/7974 NEGATIVE  NEGATIVE Final   Leukocytes,Ua 01/22/2024 NEGATIVE  NEGATIVE Final   Performed at [facility], [address], [place], [place] [address]   POC Amphetamine UR 01/22/2024 None Detected   Final   POC Secobarbital (BAR) 01/22/2024 None Detected   Final   POC Buprenorphine (BUP) 01/22/2024 None Detected   Final   POC Oxazepam (BZO) 01/22/2024 None Detected   Final   POC Cocaine UR 01/22/2024 None Detected   Final   POC Methamphetamine UR 01/22/2024 None Detected   Final   POC Morphine  01/22/2024 None Detected   Final   POC Methadone UR 01/22/2024 None Detected   Final   POC Oxycodone  UR 01/22/2024 None Detected   Final   POC Marijuana UR 01/22/2024 None Detected   Final  Admission on 12/24/2023, Discharged on 12/25/2023  Component Date Value Ref Range Status   Sodium 12/24/2023 144  135 - 145 mmol/L Final   Potassium 12/24/2023 3.8  3.5 - 5.1 mmol/L Final   Chloride 12/24/2023 107  98 - 111 mmol/L Final   CO2 12/24/2023 26  22 - 32 mmol/L Final   Glucose, Bld 12/24/2023 96  70 - 99 mg/dL Final   Glucose reference range applies only to samples taken after fasting for at least 8 hours.   BUN 12/24/2023 11  6 - 20 mg/dL Final   Creatinine, Ser 12/24/2023 0.62  0.61 - 1.24 mg/dL Final   Calcium  12/24/2023 8.6 (L)  8.9 - 10.3 mg/dL Final   Total Protein 97/72/7974 7.8  6.5  - 8.1 g/dL Final   Albumin 97/72/7974 4.3  3.5 - 5.0 g/dL Final   AST 97/72/7974 34  15 - 41 U/L Final   ALT 12/24/2023 27  0 - 44 U/L Final   Alkaline Phosphatase 12/24/2023 56  38 - 126 U/L Final   Total Bilirubin 12/24/2023 0.4  0.0 - 1.2 mg/dL Final   GFR, Estimated 12/24/2023 >60  >60 mL/min Final   Comment: (NOTE) Calculated using the CKD-EPI Creatinine Equation (2021)    Anion gap 12/24/2023 11  5 - 15 Final   Performed at [facility], [address], [place], [place] [address]   Alcohol, Ethyl (B) 12/24/2023 436 (HH)  <10 mg/dL Final   Comment: CRITICAL RESULT CALLED TO, READ BACK BY  AND VERIFIED WITH CANDIE MOLT, RN 12/25/23 0050 BY K. DAVIS (NOTE) Lowest detectable limit for serum alcohol is 10 mg/dL.  For medical purposes only. Performed at [facility], [address], [place], [place] [address]    Opiates 12/25/2023 NONE DETECTED  NONE DETECTED Final   Cocaine 12/25/2023 NONE DETECTED  NONE DETECTED Final   Benzodiazepines 12/25/2023 NONE DETECTED  NONE DETECTED Final   Amphetamines 12/25/2023 NONE DETECTED  NONE DETECTED Final   Tetrahydrocannabinol 12/25/2023 NONE DETECTED  NONE DETECTED Final   Barbiturates 12/25/2023 NONE DETECTED  NONE DETECTED Final   Comment: (NOTE) DRUG SCREEN FOR MEDICAL PURPOSES ONLY.  IF CONFIRMATION IS NEEDED FOR ANY PURPOSE, NOTIFY LAB WITHIN 5 DAYS.  LOWEST DETECTABLE LIMITS FOR URINE DRUG SCREEN Drug Class                     Cutoff (ng/mL) Amphetamine and metabolites    1000 Barbiturate and metabolites    200 Benzodiazepine                 200 Opiates and metabolites        300 Cocaine and metabolites        300 THC                            50 Performed at [facility], [address] [place], [place] [address]    WBC 12/24/2023 5.1  4.0 - 10.5 K/uL Final   RBC 12/24/2023 4.78  4.22 - 5.81 MIL/uL Final   Hemoglobin 12/24/2023 16.0  13.0 - 17.0 g/dL Final   HCT  97/72/7974 45.7  39.0 - 52.0 % Final   MCV 12/24/2023 95.6  80.0 - 100.0 fL Final   MCH 12/24/2023 33.5  26.0 - 34.0 pg Final   MCHC 12/24/2023 35.0  30.0 - 36.0 g/dL Final   RDW 97/72/7974 12.8  11.5 - 15.5 % Final   Platelets 12/24/2023 292  150 - 400 K/uL Final   nRBC 12/24/2023 0.0  0.0 - 0.2 % Final   Neutrophils Relative % 12/24/2023 61  % Final   Neutro Abs 12/24/2023 3.0  1.7 - 7.7 K/uL Final   Lymphocytes Relative 12/24/2023 31  % Final   Lymphs Abs 12/24/2023 1.6  0.7 - 4.0 K/uL Final   Monocytes Relative 12/24/2023 8  % Final   Monocytes Absolute 12/24/2023 0.4  0.1 - 1.0 K/uL Final   Eosinophils Relative 12/24/2023 0  % Final   Eosinophils Absolute 12/24/2023 0.0  0.0 - 0.5 K/uL Final   Basophils Relative 12/24/2023 0  % Final   Basophils Absolute 12/24/2023 0.0  0.0 - 0.1 K/uL Final   Immature Granulocytes 12/24/2023 0  % Final   Abs Immature Granulocytes 12/24/2023 0.02  0.00 - 0.07 K/uL Final   Performed at [facility], [address], [place], [place] [address]   Lipase 12/24/2023 41  11 - 51 U/L Final   Performed at [facility], [address], [place], [place] [address]   Color, Urine 12/25/2023 YELLOW  YELLOW Final   APPearance 12/25/2023 HAZY (A)  CLEAR Final   Specific Gravity, Urine 12/25/2023 1.010  1.005 - 1.030 Final   pH 12/25/2023 7.0  5.0 - 8.0 Final   Glucose, UA 12/25/2023 NEGATIVE  NEGATIVE mg/dL Final   Hgb urine dipstick 12/25/2023 NEGATIVE  NEGATIVE Final   Bilirubin Urine 12/25/2023 NEGATIVE  NEGATIVE  Final   Ketones, ur 12/25/2023 NEGATIVE  NEGATIVE mg/dL Final   Protein, ur 97/71/7974 NEGATIVE  NEGATIVE mg/dL Final   Nitrite 97/71/7974 NEGATIVE  NEGATIVE Final   Leukocytes,Ua 12/25/2023 NEGATIVE  NEGATIVE Final   Performed at [facility], [address] [place], [place] [address]    Allergies: Patient has no known allergies.  Medications:  Facility Ordered Medications  Medication    [COMPLETED] thiamine  (VITAMIN B1) injection 100 mg   hydrOXYzine  (ATARAX ) tablet 25 mg   loperamide  (IMODIUM ) capsule 2-4 mg   LORazepam  (ATIVAN ) tablet 1 mg   [COMPLETED] LORazepam  (ATIVAN ) tablet 1 mg   Followed by   LORazepam  (ATIVAN ) tablet 1 mg   Followed by   NOREEN ON 05/15/2024] LORazepam  (ATIVAN ) tablet 1 mg   Followed by   NOREEN ON 05/17/2024] LORazepam  (ATIVAN ) tablet 1 mg   multivitamin with minerals tablet 1 tablet   ondansetron  (ZOFRAN -ODT) disintegrating tablet 4 mg   thiamine  (VITAMIN B1) tablet 100 mg   acetaminophen  (TYLENOL ) tablet 650 mg   alum & mag hydroxide-simeth (MAALOX/MYLANTA) 200-200-20 MG/5ML suspension 30 mL   magnesium  hydroxide (MILK OF MAGNESIA) suspension 30 mL   haloperidol  (HALDOL ) tablet 5 mg   And   diphenhydrAMINE  (BENADRYL ) capsule 50 mg   haloperidol  lactate (HALDOL ) injection 5 mg   And   diphenhydrAMINE  (BENADRYL ) injection 50 mg   And   LORazepam  (ATIVAN ) injection 2 mg   haloperidol  lactate (HALDOL ) injection 10 mg   And   diphenhydrAMINE  (BENADRYL ) injection 50 mg   And   LORazepam  (ATIVAN ) injection 2 mg   sertraline  (ZOLOFT ) tablet 50 mg   traZODone  (DESYREL ) tablet 50 mg   PTA Medications  Medication Sig   sertraline  (ZOLOFT ) 50 MG tablet Take 1 tablet (50 mg total) by mouth every morning.   valACYclovir  (VALTREX ) 500 MG tablet Take 500 mg by mouth daily as needed (For outbreaks).   hydrOXYzine  (ATARAX ) 50 MG tablet Take 50 mg by mouth daily as needed for anxiety.    Long Term Goals: Improvement in symptoms so as ready for discharge  Short Term Goals: Patient will verbalize feelings in meetings with treatment team members., Patient will attend at least of 50% of the groups daily., Pt will complete the PHQ9 on admission, day 3 and discharge., Patient will participate in completing the [place] Suicide Severity Rating Scale, Patient will score a low risk of violence for 24 hours prior to discharge, and Patient will take  medications as prescribed daily.  Medical Decision Making  Treatment Plan Summary: Daily contact with patient to assess and evaluate symptoms and progress in treatment and Medication management  Safety and Monitoring: Voluntary admission to inpatient psychiatric unit for safety, stabilization and treatment Daily contact with patient to assess and evaluate symptoms and progress in treatment Patient's case to be discussed in multi-disciplinary team meeting Observation Level : q15 minute checks Vital signs: q12 hours Precautions: Safety Monitor for signs of withdrawal Abstinence from substances encouraged  SW to look into options for outpatient SA treatment at discharge   Long Term Goal(s): Improvement in symptoms so as ready for discharge  Short Term Goals: Ability to identify changes in lifestyle to reduce recurrence of condition will improve, Ability to verbalize feelings will improve, Ability to disclose and discuss suicidal ideas, Ability to demonstrate self-control will improve, Ability to identify and develop effective coping behaviors will improve, Ability to maintain clinical measurements within normal limits will improve, Compliance with prescribed medications will improve, and Ability  to identify triggers associated with substance abuse/mental health issues will improve  Diagnoses Principal Problem:   Alcohol use disorder, severe, dependence (HCC)  Medications Continue Ativan  taper and  CIWA scores protocol-See MAR for details Oral thiamine  and MVI replacement as per the [facility] Continue Zoloft  50 mg daily for depressive symptoms and GAD   PRNS -Continue Tylenol  650 mg every 6 hours PRN for mild pain -Continue Maalox 30 mg every 4 hrs PRN for indigestion -Continue Milk of Magnesia as needed every 6 hrs for constipation -Agitation protocol as per the MAR - Hydroxyzine  25 mg every 6 hours as needed -Start Trazodone  50 mg nightly prn for sleep  Recommendations  Based on my  evaluation the patient appears to have an emergency mental health condition for which I recommend the patient continues his stay at the inpatient behavioral health unit ([facility]) for treatment and stabilization, prior to sending to rehab for alcohol use disorder.  Discharge Planning: Social work and case management to assist with discharge planning and identification of hospital follow-up needs prior to discharge Estimated LOS: 5-7 days Discharge Concerns: Need to establish a safety plan; Medication compliance and effectiveness Discharge Goals: Return home with outpatient referrals for mental health follow-up including medication management/psychotherapy  Labs Reviewed: Ordered Ha1c, Vits B12, D, TSH, Lipi d panel, Mg. EKG WNL.   Total Time Spent in Direct Patient Care:  I personally spent 75 minutes on the unit in direct patient care. The direct patient care time included face-to-face time with the patient, reviewing the patient's chart, communicating with other professionals, and coordinating care. Greater than 50% of this time was spent in counseling or coordinating care with the patient regarding goals of hospitalization, psycho-education, and discharge planning needs.    I certify that inpatient services furnished can reasonably be expected to improve the patient's condition.    Donia Snell, NP 05/14/24  6:35 PM

## 2024-05-14 NOTE — ED Notes (Signed)
 Pt reports that atarax  was effective in relieving anxiety. Pt currently resting in bed.

## 2024-05-14 NOTE — ED Notes (Signed)
 Pt c/o anxiety, to the extent that it is causing him muscle tightness in his back, per pt report. RN administered prn atarax  as requested for anxiety.

## 2024-05-14 NOTE — ED Notes (Signed)
 Pt was provided with lunch, has been generally isolative in room for majority of shift. Scheduled dose of ativan  was administered for earlier c/o anxiety. CIWA-6. Pt currently resting in room.

## 2024-05-15 DIAGNOSIS — F1020 Alcohol dependence, uncomplicated: Secondary | ICD-10-CM | POA: Diagnosis not present

## 2024-05-15 DIAGNOSIS — F1410 Cocaine abuse, uncomplicated: Secondary | ICD-10-CM | POA: Diagnosis not present

## 2024-05-15 DIAGNOSIS — B182 Chronic viral hepatitis C: Secondary | ICD-10-CM | POA: Diagnosis not present

## 2024-05-15 DIAGNOSIS — A6002 Herpesviral infection of other male genital organs: Secondary | ICD-10-CM | POA: Diagnosis not present

## 2024-05-15 LAB — LIPID PANEL
Cholesterol: 187 mg/dL (ref 0–200)
HDL: 59 mg/dL (ref >40)
LDL Cholesterol: 91 mg/dL (ref 0–99)
Total CHOL/HDL Ratio: 3.2 ratio
Triglycerides: 186 mg/dL — ABNORMAL HIGH (ref <150)
VLDL: 37 mg/dL (ref 0–40)

## 2024-05-15 LAB — VITAMIN B12: Vitamin B-12: 265 pg/mL (ref 180–914)

## 2024-05-15 LAB — MAGNESIUM: Magnesium: 1.9 mg/dL (ref 1.7–2.4)

## 2024-05-15 LAB — TSH: TSH: 1.255 u[IU]/mL (ref 0.350–4.500)

## 2024-05-15 LAB — HEMOGLOBIN A1C
Hgb A1c MFr Bld: 4.8 % (ref 4.8–5.6)
Mean Plasma Glucose: 91.06 mg/dL

## 2024-05-15 LAB — VITAMIN D 25 HYDROXY (VIT D DEFICIENCY, FRACTURES): Vit D, 25-Hydroxy: 78.38 ng/mL (ref 30–100)

## 2024-05-15 NOTE — Group Note (Signed)
 Group Topic: Recovery Basics  Group Date: 05/15/2024 Start Time: 1205 End Time: 1240 Facilitators: Stanly Stabile, RN  Department: [facility]  Number of Participants: 7  Group Focus: chemical dependency education, chemical dependency issues, clarity of thought, communication, and coping skills Treatment Modality:  Behavior Modification Therapy Interventions utilized were clarification, exploration, patient education, and problem solving Purpose: enhance coping skills, explore maladaptive thinking, express feelings, express irrational fears, improve communication skills, increase insight, regain self-worth, reinforce self-care, and relapse prevention strategies  Name: Trevor [name] Date of Birth: 07/27/1981  MR: [id number]    Level of Participation: minimal Quality of Participation: attentive Interactions with others: gave feedback Mood/Affect: appropriate Triggers (if applicable):   Cognition: coherent/clear Progress: Minimal Response:   Plan: follow-up needed  Patients Problems:  Patient Active Problem List   Diagnosis Date Noted   Moderate episode of recurrent major depressive disorder (HCC) 01/30/2024   GAD (generalized anxiety disorder) 01/30/2024   Transaminitis 06/18/2022   GERD (gastroesophageal reflux disease) 06/18/2022   Hypokalemia 06/18/2022   Chronic hepatitis C without hepatic coma (HCC) 06/28/2019   Psoriasis 06/07/2019   H/O withdrawal symptoms, alcohol, with delirium and seizures (HCC) 06/07/2019   Alcohol use disorder, severe, dependence (HCC) 06/03/2019   Obesity (BMI 30-39.9) 01/09/2016   HSV-2 (herpes simplex virus 2) infection

## 2024-05-15 NOTE — Group Note (Signed)
 Group Topic: Understanding Self  Group Date: 05/15/2024 Start Time: 8069 End Time: 2000 Facilitators: Anice Benton LABOR, NT  Department: [facility]  Number of Participants: 4  Group Focus: self-awareness Treatment Modality:  Cognitive Behavioral Therapy Interventions utilized were mental fitness Purpose: explore maladaptive thinking  Name: Trevor [name] Date of Birth: 28-Jan-1981  MR: [id number]    Level of Participation: Did Not Attend  Quality of Participation: N/A Interactions with others: N/A Mood/Affect: N/A Triggers (if applicable): N/A Cognition: N/A Progress: Other Response: N/A Plan: patient will be encouraged to attend group  Patients Problems:  Patient Active Problem List   Diagnosis Date Noted   Moderate episode of recurrent major depressive disorder (HCC) 01/30/2024   GAD (generalized anxiety disorder) 01/30/2024   Transaminitis 06/18/2022   GERD (gastroesophageal reflux disease) 06/18/2022   Hypokalemia 06/18/2022   Chronic hepatitis C without hepatic coma (HCC) 06/28/2019   Psoriasis 06/07/2019   H/O withdrawal symptoms, alcohol, with delirium and seizures (HCC) 06/07/2019   Alcohol use disorder, severe, dependence (HCC) 06/03/2019   Obesity (BMI 30-39.9) 01/09/2016   HSV-2 (herpes simplex virus 2) infection

## 2024-05-15 NOTE — ED Notes (Signed)
 Blood drawn on pt, pt tolerated it well.

## 2024-05-15 NOTE — ED Notes (Signed)
 Patient resting in bed without distress.  Will monitor.

## 2024-05-15 NOTE — ED Notes (Signed)
 Patient awake and alert on unit.  He is calm and pleasant on approach.  No distress or complaint.  CIWA is 0.  Will monitor.

## 2024-05-15 NOTE — Group Note (Signed)
 Group Topic: Decisional Balance/Substance Abuse  Group Date: 05/15/2024 Start Time: 0800 End Time: 0900 Facilitators: Lonzell Dwayne RAMAN, NT  Department: [facility]  Number of Participants: 8  Group Focus: chemical dependency issues Treatment Modality:  Patient-Centered Therapy Interventions utilized were patient education Purpose: reinforce self-care  Name: Trevor [name] Date of Birth: [birth year]  MR: [id number]   .     Level of Participation: Patient attended groupminimal Quality of Participation: cooperative Interactions with others: gave feedback Mood/Affect: positive Triggers (if applicable): N/A Cognition: coherent/clear Progress: Minimal Response: appropriate  Plan: patient will be encouraged to follow up with his treatment plans  Patients Problems:  Patient Active Problem List   Diagnosis Date Noted   Moderate episode of recurrent major depressive disorder (HCC) 01/30/2024   GAD (generalized anxiety disorder) 01/30/2024   Transaminitis 06/18/2022   GERD (gastroesophageal reflux disease) 06/18/2022   Hypokalemia 06/18/2022   Chronic hepatitis C without hepatic coma (HCC) 06/28/2019   Psoriasis 06/07/2019   H/O withdrawal symptoms, alcohol, with delirium and seizures (HCC) 06/07/2019   Alcohol use disorder, severe, dependence (HCC) 06/03/2019   Obesity (BMI 30-39.9) 01/09/2016   HSV-2 (herpes simplex virus 2) infection

## 2024-05-15 NOTE — ED Provider Notes (Addendum)
 Facility Based Crisis Admission H&P  Date: 05/15/24 Patient Name: Trevor [name] MRN: [id number] Chief Complaint: Substance abuse detox.  Diagnoses:  Final diagnoses:  Cocaine use disorder (HCC)  Alcohol dependence with unspecified alcohol-induced disorder (HCC)  History of alcohol withdrawal delirium  History of seizure due to alcohol withdrawal  Herpes simplex type 2 infection  Chronic hepatitis C without hepatic coma (HCC)   HPI: Trevor [name] is a 43 y.o. male with a history of alcohol use disorder who presented to the [facility] on 7/18 seeking detoxification from alcohol.  As per initial assessment:  Patient reports he consumes up to 24, mini bottles, of liquor daily with last use yesterday when patient stated he drank, about 20 mini bottles, of liquor. Patient also reports doing 1 to 2 grams of cocaine 3 to 4 times a week with last use in the last 48 hours when patient reported he, did a gram. Patient states he last was in residential treatment 4 months ago at [facility] where he participated in a 28 day program and maintained his sobriety about a month before relapsing. Rosalita, Sherrell BRAVO, NP, 05/13/2024).   Patient assessment note, 05/15/2024: Pt presents today with flat affect and depressed mood, he rates depression today as a 6-7, rates anxiety as same, reports still feeling very tired.  His attention to personal hygiene and grooming is poor, he appears disheveled, eye contact is fair, speech is clear & coherent. He is however pleasant, and polite. Thought contents are organized and logical, and pt currently denies SI/HI/AVH or paranoia. There is no evidence of delusional thoughts.  Patient is verbalizing motivation to regain and sustain his sobriety. He is continuing to verbalize his motivation to regain his sobriety and maintain it by completing this detox and going to rehab thereafter in order to improve upon his quality of life.  Today, he is  continuing to have about his struggles with alcohol use, continuing to state that he is hoping to regain his sobriety symptom, reports that he is having some alcohol cravings, but overall the Ativan  is helping with withdrawal symptoms.  Positive reinforcement for keeping patient is to continue with his detox protocol.  Patient reports her sleep last night was good on the trazodone , she reports appetite has been fair, but improving, reports some generalized malaise, observed to have some mild tremors to bilateral arms, denies any other symptoms related to withdrawals.  Educated on the need to let staff know of clinically other symptoms that he might have, verbalized understanding.  Educated on current medications for symptomatic treatment that available to him, verbalized understanding.  Discharge disposition is currently pending patient meeting with CSW tomorrow to determine an appropriate rehab facility for him.  Since he had been admitted over the weekend, referrals will have to be sent, for her to be able to determine a discharge date based on the accepting facility.  Patient has a history of alcohol withdrawal seizures, and we will have to exhibit caution with his care, as his treatment might need to be prolonged than typical. His treatment team will revisit his discharge planning tomorrow.  Labs reviewed: No new orders at this time   PHQ 2-9:  Flowsheet Row ED from 05/13/2024 in [facility] ED from 01/23/2024 in [facility] ED from 01/22/2024 in [facility]  Thoughts that you would be better off dead, or of hurting yourself in some way -- Not at all Several  days  PHQ-9 Total Score 8 14 12     Flowsheet Row ED from 05/13/2024 in [facility] Most recent reading at 05/13/2024  4:39 PM ED from 05/13/2024 in [facility] Most recent reading at 05/13/2024  3:20 PM ED  from 01/23/2024 in [facility] Most recent reading at 01/23/2024 11:16 AM  C-SSRS RISK CATEGORY No Risk No Risk Low Risk    Screenings    Flowsheet Row Most Recent Value  CIWA-Ar Total 2    Total Time spent with patient: 45 minutes  Musculoskeletal  Strength & Muscle Tone: within normal limits Gait & Station: normal Patient leans: N/A  Psychiatric Specialty Exam  Presentation General Appearance:  Appropriate for Environment; Fairly Groomed  Eye Contact: Fair  Speech: Clear and Coherent  Speech Volume: Normal  Handedness: Right   Mood and Affect  Mood: Anxious; Depressed  Affect: Congruent   Thought Process  Thought Processes: Coherent  Descriptions of Associations:Intact  Orientation:Full (Time, Place and Person)  Thought Content:Logical  Diagnosis of Schizophrenia or Schizoaffective disorder in past: No   Hallucinations:Hallucinations: None  Ideas of Reference:None  Suicidal Thoughts:Suicidal Thoughts: No  Homicidal Thoughts:Homicidal Thoughts: No   Sensorium  Memory: Immediate Fair  Judgment: Fair  Insight: Fair   Chartered certified accountant: Fair  Attention Span: Fair  Recall: [employer] of Knowledge: Fair  Language: Fair   Psychomotor Activity  Psychomotor Activity: Psychomotor Activity: Normal   Assets  Assets: Resilience   Sleep  Sleep: Sleep: Fair   No data recorded   Physical Exam Vitals and nursing note reviewed.  HENT:     Head: Normocephalic.  Musculoskeletal:        General: Normal range of motion.    Review of Systems  Psychiatric/Behavioral:  Positive for depression and substance abuse. Negative for hallucinations, memory loss and suicidal ideas. The patient is nervous/anxious and has insomnia.   All other systems reviewed and are negative.   Blood pressure 127/78, pulse 78, temperature 97.6 F (36.4 C), temperature source Oral, resp. rate 18, SpO2  98%. There is no height or weight on file to calculate BMI.  Is the patient at risk to self? Yes -Denies SI, but alcoholism renders him at high risk of danger to self and others. Has the patient been a risk to self in the past 6 months? Yes .    Has the patient been a risk to self within the distant past? No   Is the patient a risk to others? Yes   Has the patient been a risk to others in the past 6 months? Yes   Has the patient been a risk to others within the distant past? No   Past Medical History: See above  Family History: See above  Social History: See above   Last Labs:  Admission on 05/13/2024  Component Date Value Ref Range Status   Hgb A1c MFr Bld 05/15/2024 4.8  4.8 - 5.6 % Final   Comment: (NOTE) Diagnosis of Diabetes The following HbA1c ranges recommended by the American Diabetes Association (ADA) may be used as an aid in the diagnosis of diabetes mellitus.  Hemoglobin             Suggested A1C NGSP%              Diagnosis  <5.7                   Non Diabetic  5.7-6.4  Pre-Diabetic  >6.4                   Diabetic  <7.0                   Glycemic control for                       adults with diabetes.     Mean Plasma Glucose 05/15/2024 91.06  mg/dL Final   Performed at [facility], [address], [place], [place] [address]   Cholesterol 05/15/2024 187  0 - 200 mg/dL Final   Triglycerides 92/79/7974 186 (H)  <150 mg/dL Final   HDL 92/79/7974 59  >40 mg/dL Final   Total CHOL/HDL Ratio 05/15/2024 3.2  RATIO Final   VLDL 05/15/2024 37  0 - 40 mg/dL Final   LDL Cholesterol 05/15/2024 91  0 - 99 mg/dL Final   Comment:        Total Cholesterol/HDL:CHD Risk Coronary Heart Disease Risk Table                     Men   Women  1/2 Average Risk   3.4   3.3  Average Risk       5.0   4.4  2 X Average Risk   9.6   7.1  3 X Average Risk  23.4   11.0        Use the calculated Patient Ratio above and the CHD Risk Table to determine the patient's  CHD Risk.        ATP III CLASSIFICATION (LDL):  <100     mg/dL   Optimal  899-870  mg/dL   Near or Above                    Optimal  130-159  mg/dL   Borderline  839-810  mg/dL   High  >809     mg/dL   Very High Performed at [facility], [address], [place], [place] [address]    Vit D, 25-Hydroxy 05/15/2024 78.38  30 - 100 ng/mL Final   Comment: (NOTE) Vitamin D  deficiency has been defined by the Institute of Medicine  and an Endocrine Society practice guideline as a level of serum 25-OH  vitamin D  less than 20 ng/mL (1,2). The Endocrine Society went on to  further define vitamin D  insufficiency as a level between 21 and 29  ng/mL (2).  1. IOM (Institute of Medicine). 2010. Dietary reference intakes for  calcium  and D. [place]: The [employer]. 2. Holick MF, Binkley Leggett, Bischoff-Ferrari HA, et al. Evaluation,  treatment, and prevention of vitamin D  deficiency: an Endocrine  Society clinical practice guideline, JCEM. 2011 Jul; 96(7): 1911-30.  Performed at [facility], [address], [place], [place] [address]    Vitamin B-12 05/15/2024 265  180 - 914 pg/mL Final   Comment: (NOTE) This assay is not validated for testing neonatal or myeloproliferative syndrome specimens for Vitamin B12 levels. Performed at [facility], [address], [place], [place] [address]    Magnesium  05/15/2024 1.9  1.7 - 2.4 mg/dL Final   Performed at [facility], [address] [place], [place] [address]   TSH 05/15/2024 1.255  0.350 - 4.500 uIU/mL Final   Comment: Performed by a 3rd Generation assay with a functional sensitivity of <=0.01 uIU/mL. Performed at [facility], [address] [place], [place] [address]   Admission on 05/13/2024, Discharged on 05/13/2024  Component Date Value Ref Range Status   WBC 05/13/2024 8.3  4.0 - 10.5 K/uL Final   RBC 05/13/2024 5.19  4.22 - 5.81 MIL/uL Final   Hemoglobin 05/13/2024 16.7  13.0 - 17.0  g/dL Final   HCT 92/81/7974 48.0  39.0 - 52.0 % Final   MCV 05/13/2024 92.5  80.0 - 100.0 fL Final   MCH 05/13/2024 32.2  26.0 - 34.0 pg Final   MCHC 05/13/2024 34.8  30.0 - 36.0 g/dL Final   RDW 92/81/7974 13.5  11.5 - 15.5 % Final   Platelets 05/13/2024 329  150 - 400 K/uL Final   nRBC 05/13/2024 0.0  0.0 - 0.2 % Final   Neutrophils Relative % 05/13/2024 80  % Final   Neutro Abs 05/13/2024 6.6  1.7 - 7.7 K/uL Final   Lymphocytes Relative 05/13/2024 14  % Final   Lymphs Abs 05/13/2024 1.2  0.7 - 4.0 K/uL Final   Monocytes Relative 05/13/2024 5  % Final   Monocytes Absolute 05/13/2024 0.4  0.1 - 1.0 K/uL Final   Eosinophils Relative 05/13/2024 0  % Final   Eosinophils Absolute 05/13/2024 0.0  0.0 - 0.5 K/uL Final   Basophils Relative 05/13/2024 0  % Final   Basophils Absolute 05/13/2024 0.0  0.0 - 0.1 K/uL Final   Immature Granulocytes 05/13/2024 1  % Final   Abs Immature Granulocytes 05/13/2024 0.04  0.00 - 0.07 K/uL Final   Performed at [facility], [address] [place], [place] [address]   Sodium 05/13/2024 138  135 - 145 mmol/L Final   Potassium 05/13/2024 3.9  3.5 - 5.1 mmol/L Final   Chloride 05/13/2024 97 (L)  98 - 111 mmol/L Final   CO2 05/13/2024 30  22 - 32 mmol/L Final   Glucose, Bld 05/13/2024 103 (H)  70 - 99 mg/dL Final   Glucose reference range applies only to samples taken after fasting for at least 8 hours.   BUN 05/13/2024 8  6 - 20 mg/dL Final   Creatinine, Ser 05/13/2024 0.82  0.61 - 1.24 mg/dL Final   Calcium  05/13/2024 9.0  8.9 - 10.3 mg/dL Final   Total Protein 92/81/7974 7.1  6.5 - 8.1 g/dL Final   Albumin 92/81/7974 4.1  3.5 - 5.0 g/dL Final   AST 92/81/7974 23  15 - 41 U/L Final   ALT 05/13/2024 21  0 - 44 U/L Final   Alkaline Phosphatase 05/13/2024 80  38 - 126 U/L Final   Total Bilirubin 05/13/2024 0.8  0.0 - 1.2 mg/dL Final   GFR, Estimated 05/13/2024 >60  >60 mL/min Final   Comment: (NOTE) Calculated using the CKD-EPI Creatinine Equation  (2021)    Anion gap 05/13/2024 11  5 - 15 Final   Performed at [facility], [address], [place], [place] [address]   Magnesium  05/13/2024 1.7  1.7 - 2.4 mg/dL Final   Performed at [facility], [address], [place], [place] [address]   Alcohol, Ethyl (B) 05/13/2024 52 (H)  <15 mg/dL Final   Comment: (NOTE) For medical purposes only. Performed at [facility], [address], [place], [place] [address]    POC Amphetamine UR 05/13/2024 None Detected  NONE DETECTED (Cut Off Level 1000 ng/mL) Final   POC Secobarbital (BAR) 05/13/2024 None Detected  NONE DETECTED (Cut Off Level 300 ng/mL) Final  POC Buprenorphine (BUP) 05/13/2024 None Detected  NONE DETECTED (Cut Off Level 10 ng/mL) Final   POC Oxazepam (BZO) 05/13/2024 None Detected  NONE DETECTED (Cut Off Level 300 ng/mL) Final   POC Cocaine UR 05/13/2024 None Detected  NONE DETECTED (Cut Off Level 300 ng/mL) Final   POC Methamphetamine UR 05/13/2024 None Detected  NONE DETECTED (Cut Off Level 1000 ng/mL) Final   POC Morphine  05/13/2024 None Detected  NONE DETECTED (Cut Off Level 300 ng/mL) Final   POC Methadone UR 05/13/2024 None Detected  NONE DETECTED (Cut Off Level 300 ng/mL) Final   POC Oxycodone  UR 05/13/2024 None Detected  NONE DETECTED (Cut Off Level 100 ng/mL) Final   POC Marijuana UR 05/13/2024 None Detected  NONE DETECTED (Cut Off Level 50 ng/mL) Final  Admission on 01/23/2024, Discharged on 02/02/2024  Component Date Value Ref Range Status   Sodium 01/30/2024 137  135 - 145 mmol/L Final   Potassium 01/30/2024 4.1  3.5 - 5.1 mmol/L Final   Chloride 01/30/2024 101  98 - 111 mmol/L Final   CO2 01/30/2024 30  22 - 32 mmol/L Final   Glucose, Bld 01/30/2024 92  70 - 99 mg/dL Final   Glucose reference range applies only to samples taken after fasting for at least 8 hours.   BUN 01/30/2024 17  6 - 20 mg/dL Final   Creatinine, Ser 01/30/2024 1.03  0.61 - 1.24 mg/dL Final   Calcium  01/30/2024 8.9  8.9 - 10.3  mg/dL Final   Total Protein 95/94/7974 7.2  6.5 - 8.1 g/dL Final   Albumin 95/94/7974 4.1  3.5 - 5.0 g/dL Final   AST 95/94/7974 18  15 - 41 U/L Final   ALT 01/30/2024 19  0 - 44 U/L Final   Alkaline Phosphatase 01/30/2024 43  38 - 126 U/L Final   Total Bilirubin 01/30/2024 0.6  0.0 - 1.2 mg/dL Final   GFR, Estimated 01/30/2024 >60  >60 mL/min Final   Comment: (NOTE) Calculated using the CKD-EPI Creatinine Equation (2021)    Anion gap 01/30/2024 6  5 - 15 Final   Performed at [facility], [address], [place], [place] [address]  Admission on 01/22/2024, Discharged on 01/23/2024  Component Date Value Ref Range Status   WBC 01/22/2024 6.8  4.0 - 10.5 K/uL Final   RBC 01/22/2024 5.22  4.22 - 5.81 MIL/uL Final   Hemoglobin 01/22/2024 17.2 (H)  13.0 - 17.0 g/dL Final   HCT 96/71/7974 47.8  39.0 - 52.0 % Final   MCV 01/22/2024 91.6  80.0 - 100.0 fL Final   MCH 01/22/2024 33.0  26.0 - 34.0 pg Final   MCHC 01/22/2024 36.0  30.0 - 36.0 g/dL Final   RDW 96/71/7974 12.1  11.5 - 15.5 % Final   Platelets 01/22/2024 319  150 - 400 K/uL Final   nRBC 01/22/2024 0.0  0.0 - 0.2 % Final   Neutrophils Relative % 01/22/2024 70  % Final   Neutro Abs 01/22/2024 4.8  1.7 - 7.7 K/uL Final   Lymphocytes Relative 01/22/2024 25  % Final   Lymphs Abs 01/22/2024 1.7  0.7 - 4.0 K/uL Final   Monocytes Relative 01/22/2024 4  % Final   Monocytes Absolute 01/22/2024 0.3  0.1 - 1.0 K/uL Final   Eosinophils Relative 01/22/2024 0  % Final   Eosinophils Absolute 01/22/2024 0.0  0.0 - 0.5 K/uL Final   Basophils Relative 01/22/2024 1  % Final   Basophils Absolute 01/22/2024 0.1  0.0 -  0.1 K/uL Final   Immature Granulocytes 01/22/2024 0  % Final   Abs Immature Granulocytes 01/22/2024 0.02  0.00 - 0.07 K/uL Final   Performed at [facility], [address] [place], [place] [address]   Sodium 01/22/2024 138  135 - 145 mmol/L Final   Potassium 01/22/2024 3.9  3.5 - 5.1 mmol/L Final   Chloride 01/22/2024  99  98 - 111 mmol/L Final   CO2 01/22/2024 25  22 - 32 mmol/L Final   Glucose, Bld 01/22/2024 99  70 - 99 mg/dL Final   Glucose reference range applies only to samples taken after fasting for at least 8 hours.   BUN 01/22/2024 13  6 - 20 mg/dL Final   Creatinine, Ser 01/22/2024 0.83  0.61 - 1.24 mg/dL Final   Calcium  01/22/2024 8.9  8.9 - 10.3 mg/dL Final   Total Protein 96/71/7974 7.5  6.5 - 8.1 g/dL Final   Albumin 96/71/7974 4.6  3.5 - 5.0 g/dL Final   AST 96/71/7974 44 (H)  15 - 41 U/L Final   ALT 01/22/2024 46 (H)  0 - 44 U/L Final   Alkaline Phosphatase 01/22/2024 60  38 - 126 U/L Final   Total Bilirubin 01/22/2024 0.8  0.0 - 1.2 mg/dL Final   GFR, Estimated 01/22/2024 >60  >60 mL/min Final   Comment: (NOTE) Calculated using the CKD-EPI Creatinine Equation (2021)    Anion gap 01/22/2024 14  5 - 15 Final   Performed at [facility], [address], [place], [place] [address]   Hgb A1c MFr Bld 01/22/2024 5.0  4.8 - 5.6 % Final   Comment: (NOTE) Pre diabetes:          5.7%-6.4%  Diabetes:              >6.4%  Glycemic control for   <7.0% adults with diabetes    Mean Plasma Glucose 01/22/2024 96.8  mg/dL Final   Performed at [facility], [address] [place], [place] [address]   Magnesium  01/22/2024 1.9  1.7 - 2.4 mg/dL Final   Performed at [facility], [address], [place], [place] [address]   Alcohol, Ethyl (B) 01/22/2024 109 (H)  <10 mg/dL Final   Comment: (NOTE) Lowest detectable limit for serum alcohol is 10 mg/dL.  For medical purposes only. Performed at [facility], [address], [place], [place] [address]    Cholesterol 01/22/2024 202 (H)  0 - 200 mg/dL Final   Triglycerides 96/71/7974 65  <150 mg/dL Final   HDL 96/71/7974 84  >40 mg/dL Final   Total CHOL/HDL Ratio 01/22/2024 2.4  RATIO Final   VLDL 01/22/2024 13  0 - 40 mg/dL Final   LDL Cholesterol 01/22/2024 105 (H)  0 - 99 mg/dL Final   Comment:        Total  Cholesterol/HDL:CHD Risk Coronary Heart Disease Risk Table                     Men   Women  1/2 Average Risk   3.4   3.3  Average Risk       5.0   4.4  2 X Average Risk   9.6   7.1  3 X Average Risk  23.4   11.0        Use the calculated Patient Ratio above and the CHD Risk Table to determine the patient's CHD Risk.  ATP III CLASSIFICATION (LDL):  <100     mg/dL   Optimal  899-870  mg/dL   Near or Above                    Optimal  130-159  mg/dL   Borderline  839-810  mg/dL   High  >809     mg/dL   Very High Performed at [facility], [address], [place], [place] [address]    TSH 01/22/2024 0.458  0.350 - 4.500 uIU/mL Final   Comment: Performed by a 3rd Generation assay with a functional sensitivity of <=0.01 uIU/mL. Performed at [facility], [address], [place], [place] [address]    RPR Ser Ql 01/22/2024 NON REACTIVE  NON REACTIVE Final   Performed at [facility], [address], [place], [place] [address]   Color, Urine 01/22/2024 YELLOW  YELLOW Final   APPearance 01/22/2024 CLEAR  CLEAR Final   Specific Gravity, Urine 01/22/2024 1.020  1.005 - 1.030 Final   pH 01/22/2024 5.0  5.0 - 8.0 Final   Glucose, UA 01/22/2024 NEGATIVE  NEGATIVE mg/dL Final   Hgb urine dipstick 01/22/2024 NEGATIVE  NEGATIVE Final   Bilirubin Urine 01/22/2024 NEGATIVE  NEGATIVE Final   Ketones, ur 01/22/2024 5 (A)  NEGATIVE mg/dL Final   Protein, ur 96/71/7974 NEGATIVE  NEGATIVE mg/dL Final   Nitrite 96/71/7974 NEGATIVE  NEGATIVE Final   Leukocytes,Ua 01/22/2024 NEGATIVE  NEGATIVE Final   Performed at [facility], [address], [place], [place] [address]   POC Amphetamine UR 01/22/2024 None Detected   Final   POC Secobarbital (BAR) 01/22/2024 None Detected   Final   POC Buprenorphine (BUP) 01/22/2024 None Detected   Final   POC Oxazepam (BZO) 01/22/2024 None Detected   Final   POC Cocaine UR 01/22/2024 None Detected   Final   POC Methamphetamine UR  01/22/2024 None Detected   Final   POC Morphine  01/22/2024 None Detected   Final   POC Methadone UR 01/22/2024 None Detected   Final   POC Oxycodone  UR 01/22/2024 None Detected   Final   POC Marijuana UR 01/22/2024 None Detected   Final  Admission on 12/24/2023, Discharged on 12/25/2023  Component Date Value Ref Range Status   Sodium 12/24/2023 144  135 - 145 mmol/L Final   Potassium 12/24/2023 3.8  3.5 - 5.1 mmol/L Final   Chloride 12/24/2023 107  98 - 111 mmol/L Final   CO2 12/24/2023 26  22 - 32 mmol/L Final   Glucose, Bld 12/24/2023 96  70 - 99 mg/dL Final   Glucose reference range applies only to samples taken after fasting for at least 8 hours.   BUN 12/24/2023 11  6 - 20 mg/dL Final   Creatinine, Ser 12/24/2023 0.62  0.61 - 1.24 mg/dL Final   Calcium  12/24/2023 8.6 (L)  8.9 - 10.3 mg/dL Final   Total Protein 97/72/7974 7.8  6.5 - 8.1 g/dL Final   Albumin 97/72/7974 4.3  3.5 - 5.0 g/dL Final   AST 97/72/7974 34  15 - 41 U/L Final   ALT 12/24/2023 27  0 - 44 U/L Final   Alkaline Phosphatase 12/24/2023 56  38 - 126 U/L Final   Total Bilirubin 12/24/2023 0.4  0.0 - 1.2 mg/dL Final   GFR, Estimated 12/24/2023 >60  >60 mL/min Final   Comment: (NOTE) Calculated using the CKD-EPI Creatinine Equation (2021)    Anion gap 12/24/2023 11  5 - 15 Final   Performed at [facility], [address] [place], [place] [address]   Alcohol, Ethyl (B) 12/24/2023 436 (HH)  <10 mg/dL Final   Comment: CRITICAL RESULT CALLED TO, READ BACK BY AND VERIFIED WITH CANDIE MOLT, RN 12/25/23 0050 BY K. DAVIS (NOTE) Lowest detectable limit for serum alcohol is 10 mg/dL.  For medical purposes only. Performed at [facility], [address] [place], [place] [address]    Opiates 12/25/2023 NONE DETECTED  NONE DETECTED Final   Cocaine 12/25/2023 NONE DETECTED  NONE DETECTED Final   Benzodiazepines 12/25/2023 NONE DETECTED  NONE DETECTED Final   Amphetamines 12/25/2023 NONE  DETECTED  NONE DETECTED Final   Tetrahydrocannabinol 12/25/2023 NONE DETECTED  NONE DETECTED Final   Barbiturates 12/25/2023 NONE DETECTED  NONE DETECTED Final   Comment: (NOTE) DRUG SCREEN FOR MEDICAL PURPOSES ONLY.  IF CONFIRMATION IS NEEDED FOR ANY PURPOSE, NOTIFY LAB WITHIN 5 DAYS.  LOWEST DETECTABLE LIMITS FOR URINE DRUG SCREEN Drug Class                     Cutoff (ng/mL) Amphetamine and metabolites    1000 Barbiturate and metabolites    200 Benzodiazepine                 200 Opiates and metabolites        300 Cocaine and metabolites        300 THC                            50 Performed at [facility], [address] [place], [place] [address]    WBC 12/24/2023 5.1  4.0 - 10.5 K/uL Final   RBC 12/24/2023 4.78  4.22 - 5.81 MIL/uL Final   Hemoglobin 12/24/2023 16.0  13.0 - 17.0 g/dL Final   HCT 97/72/7974 45.7  39.0 - 52.0 % Final   MCV 12/24/2023 95.6  80.0 - 100.0 fL Final   MCH 12/24/2023 33.5  26.0 - 34.0 pg Final   MCHC 12/24/2023 35.0  30.0 - 36.0 g/dL Final   RDW 97/72/7974 12.8  11.5 - 15.5 % Final   Platelets 12/24/2023 292  150 - 400 K/uL Final   nRBC 12/24/2023 0.0  0.0 - 0.2 % Final   Neutrophils Relative % 12/24/2023 61  % Final   Neutro Abs 12/24/2023 3.0  1.7 - 7.7 K/uL Final   Lymphocytes Relative 12/24/2023 31  % Final   Lymphs Abs 12/24/2023 1.6  0.7 - 4.0 K/uL Final   Monocytes Relative 12/24/2023 8  % Final   Monocytes Absolute 12/24/2023 0.4  0.1 - 1.0 K/uL Final   Eosinophils Relative 12/24/2023 0  % Final   Eosinophils Absolute 12/24/2023 0.0  0.0 - 0.5 K/uL Final   Basophils Relative 12/24/2023 0  % Final   Basophils Absolute 12/24/2023 0.0  0.0 - 0.1 K/uL Final   Immature Granulocytes 12/24/2023 0  % Final   Abs Immature Granulocytes 12/24/2023 0.02  0.00 - 0.07 K/uL Final   Performed at [facility], [address] [place], [place] [address]   Lipase 12/24/2023 41  11 - 51 U/L Final   Performed at  [facility], [address], [place], [place] [address]   Color, Urine 12/25/2023 YELLOW  YELLOW Final   APPearance 12/25/2023 HAZY (A)  CLEAR Final   Specific Gravity, Urine 12/25/2023 1.010  1.005 - 1.030 Final   pH 12/25/2023 7.0  5.0 - 8.0 Final   Glucose, UA 12/25/2023 NEGATIVE  NEGATIVE mg/dL Final   Hgb urine dipstick 12/25/2023 NEGATIVE  NEGATIVE Final   Bilirubin Urine 12/25/2023 NEGATIVE  NEGATIVE Final   Ketones, ur 12/25/2023 NEGATIVE  NEGATIVE mg/dL Final   Protein, ur 97/71/7974 NEGATIVE  NEGATIVE mg/dL Final   Nitrite 97/71/7974 NEGATIVE  NEGATIVE Final   Leukocytes,Ua 12/25/2023 NEGATIVE  NEGATIVE Final   Performed at [facility], [address] [place], [place] [address]    Allergies: Patient has no known allergies.  Medications:  Facility Ordered Medications  Medication   [COMPLETED] thiamine  (VITAMIN B1) injection 100 mg   hydrOXYzine  (ATARAX ) tablet 25 mg   loperamide  (IMODIUM ) capsule 2-4 mg   LORazepam  (ATIVAN ) tablet 1 mg   [COMPLETED] LORazepam  (ATIVAN ) tablet 1 mg   Followed by   [COMPLETED] LORazepam  (ATIVAN ) tablet 1 mg   Followed by   LORazepam  (ATIVAN ) tablet 1 mg   Followed by   NOREEN ON 05/17/2024] LORazepam  (ATIVAN ) tablet 1 mg   multivitamin with minerals tablet 1 tablet   ondansetron  (ZOFRAN -ODT) disintegrating tablet 4 mg   thiamine  (VITAMIN B1) tablet 100 mg   acetaminophen  (TYLENOL ) tablet 650 mg   alum & mag hydroxide-simeth (MAALOX/MYLANTA) 200-200-20 MG/5ML suspension 30 mL   magnesium  hydroxide (MILK OF MAGNESIA) suspension 30 mL   haloperidol  (HALDOL ) tablet 5 mg   And   diphenhydrAMINE  (BENADRYL ) capsule 50 mg   haloperidol  lactate (HALDOL ) injection 5 mg   And   diphenhydrAMINE  (BENADRYL ) injection 50 mg   And   LORazepam  (ATIVAN ) injection 2 mg   haloperidol  lactate (HALDOL ) injection 10 mg   And   diphenhydrAMINE  (BENADRYL ) injection 50 mg   And   LORazepam  (ATIVAN ) injection 2 mg    sertraline  (ZOLOFT ) tablet 50 mg   traZODone  (DESYREL ) tablet 50 mg   PTA Medications  Medication Sig   sertraline  (ZOLOFT ) 50 MG tablet Take 1 tablet (50 mg total) by mouth every morning.   valACYclovir  (VALTREX ) 500 MG tablet Take 500 mg by mouth daily as needed (For outbreaks).   hydrOXYzine  (ATARAX ) 50 MG tablet Take 50 mg by mouth daily as needed for anxiety.    Long Term Goals: Improvement in symptoms so as ready for discharge  Short Term Goals: Patient will verbalize feelings in meetings with treatment team members., Patient will attend at least of 50% of the groups daily., Pt will complete the PHQ9 on admission, day 3 and discharge., Patient will participate in completing the [place] Suicide Severity Rating Scale, Patient will score a low risk of violence for 24 hours prior to discharge, and Patient will take medications as prescribed daily.  Medical Decision Making  Treatment Plan Summary: Daily contact with patient to assess and evaluate symptoms and progress in treatment and Medication management  Safety and Monitoring: Voluntary admission to inpatient psychiatric unit for safety, stabilization and treatment Daily contact with patient to assess and evaluate symptoms and progress in treatment Patient's case to be discussed in multi-disciplinary team meeting Observation Level : q15 minute checks Vital signs: q12 hours Precautions: Safety Monitor for signs of withdrawal Abstinence from substances encouraged  SW to look into options for outpatient SA treatment at discharge   Long Term Goal(s): Improvement in symptoms so as ready for discharge  Short Term Goals: Ability to identify changes in lifestyle to reduce recurrence of condition will improve, Ability to verbalize feelings will improve, Ability to  disclose and discuss suicidal ideas, Ability to demonstrate self-control will improve, Ability to identify and develop effective coping behaviors will improve, Ability to  maintain clinical measurements within normal limits will improve, Compliance with prescribed medications will improve, and Ability to identify triggers associated with substance abuse/mental health issues will improve  Diagnoses Principal Problem:   Alcohol use disorder, severe, dependence (HCC)  Medications Continue Ativan  taper and  CIWA scores protocol-See MAR for details Oral thiamine  and MVI replacement as per the [facility] Continue Zoloft  50 mg daily for depressive symptoms and GAD   PRNS -Continue Tylenol  650 mg every 6 hours PRN for mild pain -Continue Maalox 30 mg every 4 hrs PRN for indigestion -Continue Milk of Magnesia as needed every 6 hrs for constipation -Agitation protocol as per the MAR -Hydroxyzine  25 mg every 6 hours as needed -Continue Trazodone  50 mg nightly prn for sleep  Recommendations  Based on my evaluation the patient continues to meed the inpatient behavioral health unit criteria ([facility]) for treatment and stabilization, prior to sending to rehab for alcohol use disorder.  Discharge Planning: Social work and case management to assist with discharge planning and identification of hospital follow-up needs prior to discharge Estimated LOS: 5-7 days Discharge Concerns: Need to establish a safety plan; Medication compliance and effectiveness Discharge Goals: Return home with outpatient referrals for mental health follow-up including medication management/psychotherapy  Labs Reviewed: Ordered Ha1c, Vits B12, D, TSH, Lipi d panel, Mg. EKG WNL.   Total Time Spent in Direct Patient Care:  I personally spent 45 minutes on the unit in direct patient care. The direct patient care time included face-to-face time with the patient, reviewing the patient's chart, communicating with other professionals, and coordinating care. Greater than 50% of this time was spent in counseling or coordinating care with the patient regarding goals of hospitalization,  psycho-education, and discharge planning needs.    I certify that inpatient services furnished can reasonably be expected to improve the patient's condition.    Donia Snell, NP 05/15/24  5:38 PM

## 2024-05-16 DIAGNOSIS — A6002 Herpesviral infection of other male genital organs: Secondary | ICD-10-CM | POA: Diagnosis not present

## 2024-05-16 DIAGNOSIS — F1410 Cocaine abuse, uncomplicated: Secondary | ICD-10-CM | POA: Diagnosis not present

## 2024-05-16 DIAGNOSIS — F1020 Alcohol dependence, uncomplicated: Secondary | ICD-10-CM | POA: Diagnosis not present

## 2024-05-16 DIAGNOSIS — B182 Chronic viral hepatitis C: Secondary | ICD-10-CM | POA: Diagnosis not present

## 2024-05-16 MED ORDER — SERTRALINE HCL 50 MG PO TABS: 50.0000 mg | ORAL_TABLET | Freq: Every day | ORAL | Status: DC

## 2024-05-16 NOTE — ED Notes (Addendum)
 Patient is in the bedroom calm and composed. NAD. Respirations even and unlabored.  Environment secured per policy. Will keep monitoring for safety.

## 2024-05-16 NOTE — ED Notes (Signed)
 Patient is in the bedroom calm and and sleeping. NAD.  Will monitor for safety

## 2024-05-16 NOTE — ED Notes (Signed)
 Patient is resting sleeping well, nothing strange noted

## 2024-05-16 NOTE — ED Notes (Signed)
 Patient is sleeping

## 2024-05-16 NOTE — Discharge Planning (Signed)
 LCSW met with patient to assess current mood, affect, physical state, and inquire about needs/goals while here in [facility] and after discharge. Patient reports he presented to [facility] for alcohol and cocaine use. Patient reported drinking approximately 20-24 mini bottles of whiskey per day.   Patient stated that he was only using cocaine for the past 2 weeks and stated using cocaine the end of June. Patient stated he was living in an [facility] for past 3 months and was at [facility] previously following his former detox here about 4-5 months ago.   Patient reports his/her current goal is to seek residential placement for substance use. Patient denies any prior history of outpatient or inpatient substance abuse treatment. Patient currently denies any SI/HI/AVH.   Patient aware that LCSW will send referrals out for review and will follow up to provide updates as received. Patient expressed understanding and appreciation of LCSW assistance. No other needs were reported at this time by patient.

## 2024-05-16 NOTE — ED Provider Notes (Signed)
 Behavioral Health Progress Note   Date: 05/16/24 Patient Name: Trevor [name] MRN: [id number] Chief Complaint: Substance abuse detox.  Diagnoses:  Final diagnoses:  Cocaine use disorder (HCC)  Alcohol dependence with unspecified alcohol-induced disorder (HCC)  History of alcohol withdrawal delirium  History of seizure due to alcohol withdrawal  Herpes simplex type 2 infection  Chronic hepatitis C without hepatic coma (HCC)   HPI: Trevor [name] is a 43 y.o. male with a history of alcohol use disorder who presented to the [facility] on 7/18 seeking detoxification from alcohol.   As per initial assessment:  Patient reports he consumes up to 24, mini bottles, of liquor daily with last use yesterday when patient stated he drank, about 20 mini bottles, of liquor. Patient also reports doing 1 to 2 grams of cocaine 3 to 4 times a week with last use in the last 48 hours when patient reported he, did a gram. Patient states he last was in residential treatment 4 months ago at [facility] where he participated in a 28 day program and maintained his sobriety about a month before relapsing. Rosalita, Sherrell BRAVO, NP, 05/13/2024).   Patient assessment note, 05/16/2024: Pt reports a slight improvement in his mood today, shares that depression today is 4, which is an improvement from yesterday. (He rated it yesterday 6-7), rates anxiety 6 (10 being worst for both). Reports still feeling some lingering tiredness. His attention to personal hygiene and grooming has improved as compared yo yesterday, he appears less disheveled, eye contact is fair, speech is clear & coherent.   He remains pleasant,& polite. Thought contents are organized and logical, and pt continues to deny SI/HI/AVH or paranoia. There is no evidence of delusional thoughts. He is continuing to verbalize his motivation to regain his sobriety and maintain it by completing this detox and going to rehab thereafter in  order to improve upon his quality of life. The Ativan  is helping with withdrawal symptoms, and pt reports minimal withdrawal symptoms today.  Positive reinforcement for keeping patient is to continue with his detox protocol.  Patient denies cravings for substances of abuse, rates depression as 64(10 being worst), rates anxiety as 6 on same scale.  Denies  medication related side effects, continuing to do well on detox protocol, continuing to require hospitalization at this time so as to successfully detox off alcohol with no complications. Detox protocol will be completed on 07/22 at 10.00.   Discharge disposition remains pending & patient will be meeting with CSW at some point today to determine an appropriate rehab facility for him.  Since he had been admitted over the weekend, referrals will have to be sent after his meeting with CSW.  Patient has a history of alcohol withdrawal seizures, and we are continuing to exhibit caution with his care, as his discharge might need to be prolonged than typical, to ensure that there is no risk of alcohol related seizure activities, prior to discharge.   We will hold off increasing dose of Zoloft  and move this medication to the night time due to complaints of tiredness.   Labs reviewed: No new orders at this time   PHQ 2-9:  Flowsheet Row ED from 05/13/2024 in [facility] ED from 01/23/2024 in [facility] ED from 01/22/2024 in [facility]  Thoughts that you would be better off dead, or of hurting yourself in some way -- Not at all Several days  PHQ-9 Total  Score 8 14 12     Flowsheet Row ED from 05/13/2024 in [facility] Most recent reading at 05/13/2024  4:39 PM ED from 05/13/2024 in [facility] Most recent reading at 05/13/2024  3:20 PM ED from 01/23/2024 in [facility] Most recent reading at  01/23/2024 11:16 AM  C-SSRS RISK CATEGORY No Risk No Risk Low Risk    Screenings    Flowsheet Row Most Recent Value  CIWA-Ar Total 2    Total Time spent with patient: 45 minutes  Musculoskeletal  Strength & Muscle Tone: within normal limits Gait & Station: normal Patient leans: N/A  Psychiatric Specialty Exam  Presentation General Appearance:  Appropriate for Environment; Disheveled  Eye Contact: Fair  Speech: Clear and Coherent  Speech Volume: Normal  Handedness: Right   Mood and Affect  Mood: Depressed; Anxious  Affect: Congruent   Thought Process  Thought Processes: Coherent  Descriptions of Associations:Intact  Orientation:Full (Time, Place and Person)  Thought Content:Logical  Diagnosis of Schizophrenia or Schizoaffective disorder in past: No   Hallucinations:Hallucinations: None  Ideas of Reference:None  Suicidal Thoughts:Suicidal Thoughts: No  Homicidal Thoughts:Homicidal Thoughts: No   Sensorium  Memory: Immediate Fair  Judgment: Fair  Insight: Fair   Art therapist  Concentration: Fair  Attention Span: Fair  Recall: Fair  Fund of Knowledge: Fair  Language: Fair   Psychomotor Activity  Psychomotor Activity: Psychomotor Activity: Normal   Assets  Assets: Desire for Improvement; Resilience   Sleep  Sleep: Sleep: Fair   No data recorded   Physical Exam Vitals and nursing note reviewed.  HENT:     Head: Normocephalic.  Musculoskeletal:        General: Normal range of motion.    Review of Systems  Psychiatric/Behavioral:  Positive for depression and substance abuse. Negative for hallucinations, memory loss and suicidal ideas. The patient is nervous/anxious and has insomnia.   All other systems reviewed and are negative.   Blood pressure 110/74, pulse 78, temperature 97.8 F (36.6 C), temperature source Oral, resp. rate 16, SpO2 98%. There is no height or weight on file to calculate  BMI.  Is the patient at risk to self? Yes -Denies SI, but alcoholism renders him at high risk of danger to self and others. Has the patient been a risk to self in the past 6 months? Yes .    Has the patient been a risk to self within the distant past? No   Is the patient a risk to others? Yes   Has the patient been a risk to others in the past 6 months? Yes   Has the patient been a risk to others within the distant past? No   Past Medical History: See above  Family History: See above  Social History: See above   Last Labs:  Admission on 05/13/2024  Component Date Value Ref Range Status   Hgb A1c MFr Bld 05/15/2024 4.8  4.8 - 5.6 % Final   Comment: (NOTE) Diagnosis of Diabetes The following HbA1c ranges recommended by the American Diabetes Association (ADA) may be used as an aid in the diagnosis of diabetes mellitus.  Hemoglobin             Suggested A1C NGSP%              Diagnosis  <5.7                   Non Diabetic  5.7-6.4  Pre-Diabetic  >6.4                   Diabetic  <7.0                   Glycemic control for                       adults with diabetes.     Mean Plasma Glucose 05/15/2024 91.06  mg/dL Final   Performed at [facility], [address], [place], [place] [address]   Cholesterol 05/15/2024 187  0 - 200 mg/dL Final   Triglycerides 92/79/7974 186 (H)  <150 mg/dL Final   HDL 92/79/7974 59  >40 mg/dL Final   Total CHOL/HDL Ratio 05/15/2024 3.2  RATIO Final   VLDL 05/15/2024 37  0 - 40 mg/dL Final   LDL Cholesterol 05/15/2024 91  0 - 99 mg/dL Final   Comment:        Total Cholesterol/HDL:CHD Risk Coronary Heart Disease Risk Table                     Men   Women  1/2 Average Risk   3.4   3.3  Average Risk       5.0   4.4  2 X Average Risk   9.6   7.1  3 X Average Risk  23.4   11.0        Use the calculated Patient Ratio above and the CHD Risk Table to determine the patient's CHD Risk.        ATP III CLASSIFICATION (LDL):  <100      mg/dL   Optimal  899-870  mg/dL   Near or Above                    Optimal  130-159  mg/dL   Borderline  839-810  mg/dL   High  >809     mg/dL   Very High Performed at [facility], [address], [place], [place] [address]    Vit D, 25-Hydroxy 05/15/2024 78.38  30 - 100 ng/mL Final   Comment: (NOTE) Vitamin D  deficiency has been defined by the Institute of Medicine  and an Endocrine Society practice guideline as a level of serum 25-OH  vitamin D  less than 20 ng/mL (1,2). The Endocrine Society went on to  further define vitamin D  insufficiency as a level between 21 and 29  ng/mL (2).  1. IOM (Institute of Medicine). 2010. Dietary reference intakes for  calcium  and D. [place]: [employer]. 2. Holick MF, Binkley Gayle [place], Bischoff-Ferrari HA, et al. Evaluation,  treatment, and prevention of vitamin D  deficiency: an Endocrine  Society clinical practice guideline, JCEM. 2011 Jul; 96(7): 1911-30.  Performed at [facility], [address], [place], [place] [address]    Vitamin B-12 05/15/2024 265  180 - 914 pg/mL Final   Comment: (NOTE) This assay is not validated for testing neonatal or myeloproliferative syndrome specimens for Vitamin B12 levels. Performed at [facility], [address] [place], [place] [address]    Magnesium  05/15/2024 1.9  1.7 - 2.4 mg/dL Final   Performed at [facility], [address], [place], [place] [address]   TSH 05/15/2024 1.255  0.350 - 4.500 uIU/mL Final   Comment: Performed by a 3rd Generation assay with a functional sensitivity of <=0.01 uIU/mL. Performed at [facility], [address] [place], [place] [address]   Admission on 05/13/2024, Discharged on 05/13/2024  Component Date Value Ref Range Status   WBC 05/13/2024 8.3  4.0 - 10.5 K/uL Final   RBC 05/13/2024 5.19  4.22 - 5.81 MIL/uL Final   Hemoglobin 05/13/2024 16.7  13.0 - 17.0 g/dL Final   HCT 92/81/7974 48.0  39.0 - 52.0 % Final    MCV 05/13/2024 92.5  80.0 - 100.0 fL Final   MCH 05/13/2024 32.2  26.0 - 34.0 pg Final   MCHC 05/13/2024 34.8  30.0 - 36.0 g/dL Final   RDW 92/81/7974 13.5  11.5 - 15.5 % Final   Platelets 05/13/2024 329  150 - 400 K/uL Final   nRBC 05/13/2024 0.0  0.0 - 0.2 % Final   Neutrophils Relative % 05/13/2024 80  % Final   Neutro Abs 05/13/2024 6.6  1.7 - 7.7 K/uL Final   Lymphocytes Relative 05/13/2024 14  % Final   Lymphs Abs 05/13/2024 1.2  0.7 - 4.0 K/uL Final   Monocytes Relative 05/13/2024 5  % Final   Monocytes Absolute 05/13/2024 0.4  0.1 - 1.0 K/uL Final   Eosinophils Relative 05/13/2024 0  % Final   Eosinophils Absolute 05/13/2024 0.0  0.0 - 0.5 K/uL Final   Basophils Relative 05/13/2024 0  % Final   Basophils Absolute 05/13/2024 0.0  0.0 - 0.1 K/uL Final   Immature Granulocytes 05/13/2024 1  % Final   Abs Immature Granulocytes 05/13/2024 0.04  0.00 - 0.07 K/uL Final   Performed at [facility], [address], [place], [place] [address]   Sodium 05/13/2024 138  135 - 145 mmol/L Final   Potassium 05/13/2024 3.9  3.5 - 5.1 mmol/L Final   Chloride 05/13/2024 97 (L)  98 - 111 mmol/L Final   CO2 05/13/2024 30  22 - 32 mmol/L Final   Glucose, Bld 05/13/2024 103 (H)  70 - 99 mg/dL Final   Glucose reference range applies only to samples taken after fasting for at least 8 hours.   BUN 05/13/2024 8  6 - 20 mg/dL Final   Creatinine, Ser 05/13/2024 0.82  0.61 - 1.24 mg/dL Final   Calcium  05/13/2024 9.0  8.9 - 10.3 mg/dL Final   Total Protein 92/81/7974 7.1  6.5 - 8.1 g/dL Final   Albumin 92/81/7974 4.1  3.5 - 5.0 g/dL Final   AST 92/81/7974 23  15 - 41 U/L Final   ALT 05/13/2024 21  0 - 44 U/L Final   Alkaline Phosphatase 05/13/2024 80  38 - 126 U/L Final   Total Bilirubin 05/13/2024 0.8  0.0 - 1.2 mg/dL Final   GFR, Estimated 05/13/2024 >60  >60 mL/min Final   Comment: (NOTE) Calculated using the CKD-EPI Creatinine Equation (2021)    Anion gap 05/13/2024 11  5 - 15 Final    Performed at [facility], [address] [place], [place] [address]   Magnesium  05/13/2024 1.7  1.7 - 2.4 mg/dL Final   Performed at [facility], [address] [place], [place] [address]   Alcohol, Ethyl (B) 05/13/2024 52 (H)  <15 mg/dL Final   Comment: (NOTE) For medical purposes only. Performed at [facility], [address] [place], [place] [address]    POC Amphetamine UR 05/13/2024 None Detected  NONE DETECTED (Cut Off Level 1000 ng/mL) Final   POC Secobarbital (BAR) 05/13/2024 None Detected  NONE DETECTED (Cut Off Level 300 ng/mL) Final  POC Buprenorphine (BUP) 05/13/2024 None Detected  NONE DETECTED (Cut Off Level 10 ng/mL) Final   POC Oxazepam (BZO) 05/13/2024 None Detected  NONE DETECTED (Cut Off Level 300 ng/mL) Final   POC Cocaine UR 05/13/2024 None Detected  NONE DETECTED (Cut Off Level 300 ng/mL) Final   POC Methamphetamine UR 05/13/2024 None Detected  NONE DETECTED (Cut Off Level 1000 ng/mL) Final   POC Morphine  05/13/2024 None Detected  NONE DETECTED (Cut Off Level 300 ng/mL) Final   POC Methadone UR 05/13/2024 None Detected  NONE DETECTED (Cut Off Level 300 ng/mL) Final   POC Oxycodone  UR 05/13/2024 None Detected  NONE DETECTED (Cut Off Level 100 ng/mL) Final   POC Marijuana UR 05/13/2024 None Detected  NONE DETECTED (Cut Off Level 50 ng/mL) Final  Admission on 01/23/2024, Discharged on 02/02/2024  Component Date Value Ref Range Status   Sodium 01/30/2024 137  135 - 145 mmol/L Final   Potassium 01/30/2024 4.1  3.5 - 5.1 mmol/L Final   Chloride 01/30/2024 101  98 - 111 mmol/L Final   CO2 01/30/2024 30  22 - 32 mmol/L Final   Glucose, Bld 01/30/2024 92  70 - 99 mg/dL Final   Glucose reference range applies only to samples taken after fasting for at least 8 hours.   BUN 01/30/2024 17  6 - 20 mg/dL Final   Creatinine, Ser 01/30/2024 1.03  0.61 - 1.24 mg/dL Final   Calcium  01/30/2024 8.9  8.9 - 10.3 mg/dL Final   Total Protein 95/94/7974 7.2  6.5 -  8.1 g/dL Final   Albumin 95/94/7974 4.1  3.5 - 5.0 g/dL Final   AST 95/94/7974 18  15 - 41 U/L Final   ALT 01/30/2024 19  0 - 44 U/L Final   Alkaline Phosphatase 01/30/2024 43  38 - 126 U/L Final   Total Bilirubin 01/30/2024 0.6  0.0 - 1.2 mg/dL Final   GFR, Estimated 01/30/2024 >60  >60 mL/min Final   Comment: (NOTE) Calculated using the CKD-EPI Creatinine Equation (2021)    Anion gap 01/30/2024 6  5 - 15 Final   Performed at [facility], [address] [place], [place] [address]  Admission on 01/22/2024, Discharged on 01/23/2024  Component Date Value Ref Range Status   WBC 01/22/2024 6.8  4.0 - 10.5 K/uL Final   RBC 01/22/2024 5.22  4.22 - 5.81 MIL/uL Final   Hemoglobin 01/22/2024 17.2 (H)  13.0 - 17.0 g/dL Final   HCT 96/71/7974 47.8  39.0 - 52.0 % Final   MCV 01/22/2024 91.6  80.0 - 100.0 fL Final   MCH 01/22/2024 33.0  26.0 - 34.0 pg Final   MCHC 01/22/2024 36.0  30.0 - 36.0 g/dL Final   RDW 96/71/7974 12.1  11.5 - 15.5 % Final   Platelets 01/22/2024 319  150 - 400 K/uL Final   nRBC 01/22/2024 0.0  0.0 - 0.2 % Final   Neutrophils Relative % 01/22/2024 70  % Final   Neutro Abs 01/22/2024 4.8  1.7 - 7.7 K/uL Final   Lymphocytes Relative 01/22/2024 25  % Final   Lymphs Abs 01/22/2024 1.7  0.7 - 4.0 K/uL Final   Monocytes Relative 01/22/2024 4  % Final   Monocytes Absolute 01/22/2024 0.3  0.1 - 1.0 K/uL Final   Eosinophils Relative 01/22/2024 0  % Final   Eosinophils Absolute 01/22/2024 0.0  0.0 - 0.5 K/uL Final   Basophils Relative 01/22/2024 1  % Final   Basophils Absolute 01/22/2024 0.1  0.0 -  0.1 K/uL Final   Immature Granulocytes 01/22/2024 0  % Final   Abs Immature Granulocytes 01/22/2024 0.02  0.00 - 0.07 K/uL Final   Performed at [facility], [address], [place], [place] [address]   Sodium 01/22/2024 138  135 - 145 mmol/L Final   Potassium 01/22/2024 3.9  3.5 - 5.1 mmol/L Final   Chloride 01/22/2024 99  98 - 111 mmol/L Final   CO2 01/22/2024 25  22  - 32 mmol/L Final   Glucose, Bld 01/22/2024 99  70 - 99 mg/dL Final   Glucose reference range applies only to samples taken after fasting for at least 8 hours.   BUN 01/22/2024 13  6 - 20 mg/dL Final   Creatinine, Ser 01/22/2024 0.83  0.61 - 1.24 mg/dL Final   Calcium  01/22/2024 8.9  8.9 - 10.3 mg/dL Final   Total Protein 96/71/7974 7.5  6.5 - 8.1 g/dL Final   Albumin 96/71/7974 4.6  3.5 - 5.0 g/dL Final   AST 96/71/7974 44 (H)  15 - 41 U/L Final   ALT 01/22/2024 46 (H)  0 - 44 U/L Final   Alkaline Phosphatase 01/22/2024 60  38 - 126 U/L Final   Total Bilirubin 01/22/2024 0.8  0.0 - 1.2 mg/dL Final   GFR, Estimated 01/22/2024 >60  >60 mL/min Final   Comment: (NOTE) Calculated using the CKD-EPI Creatinine Equation (2021)    Anion gap 01/22/2024 14  5 - 15 Final   Performed at [facility], [address] [place], [place] [address]   Hgb A1c MFr Bld 01/22/2024 5.0  4.8 - 5.6 % Final   Comment: (NOTE) Pre diabetes:          5.7%-6.4%  Diabetes:              >6.4%  Glycemic control for   <7.0% adults with diabetes    Mean Plasma Glucose 01/22/2024 96.8  mg/dL Final   Performed at [facility], [address], [place], [place] [address]   Magnesium  01/22/2024 1.9  1.7 - 2.4 mg/dL Final   Performed at [facility], [address] [place], [place] [address]   Alcohol, Ethyl (B) 01/22/2024 109 (H)  <10 mg/dL Final   Comment: (NOTE) Lowest detectable limit for serum alcohol is 10 mg/dL.  For medical purposes only. Performed at [facility], [address] [place], [place] [address]    Cholesterol 01/22/2024 202 (H)  0 - 200 mg/dL Final   Triglycerides 96/71/7974 65  <150 mg/dL Final   HDL 96/71/7974 84  >40 mg/dL Final   Total CHOL/HDL Ratio 01/22/2024 2.4  RATIO Final   VLDL 01/22/2024 13  0 - 40 mg/dL Final   LDL Cholesterol 01/22/2024 105 (H)  0 - 99 mg/dL Final   Comment:        Total Cholesterol/HDL:CHD Risk Coronary Heart Disease Risk Table                      Men   Women  1/2 Average Risk   3.4   3.3  Average Risk       5.0   4.4  2 X Average Risk   9.6   7.1  3 X Average Risk  23.4   11.0        Use the calculated Patient Ratio above and the CHD Risk Table to determine the patient's CHD Risk.  ATP III CLASSIFICATION (LDL):  <100     mg/dL   Optimal  899-870  mg/dL   Near or Above                    Optimal  130-159  mg/dL   Borderline  839-810  mg/dL   High  >809     mg/dL   Very High Performed at [facility], [address] [place], [place] [address]    TSH 01/22/2024 0.458  0.350 - 4.500 uIU/mL Final   Comment: Performed by a 3rd Generation assay with a functional sensitivity of <=0.01 uIU/mL. Performed at [facility], [address] [place], [place] [address]    RPR Ser Ql 01/22/2024 NON REACTIVE  NON REACTIVE Final   Performed at [facility], [address] [place], [place] [address]   Color, Urine 01/22/2024 YELLOW  YELLOW Final   APPearance 01/22/2024 CLEAR  CLEAR Final   Specific Gravity, Urine 01/22/2024 1.020  1.005 - 1.030 Final   pH 01/22/2024 5.0  5.0 - 8.0 Final   Glucose, UA 01/22/2024 NEGATIVE  NEGATIVE mg/dL Final   Hgb urine dipstick 01/22/2024 NEGATIVE  NEGATIVE Final   Bilirubin Urine 01/22/2024 NEGATIVE  NEGATIVE Final   Ketones, ur 01/22/2024 5 (A)  NEGATIVE mg/dL Final   Protein, ur 96/71/7974 NEGATIVE  NEGATIVE mg/dL Final   Nitrite 96/71/7974 NEGATIVE  NEGATIVE Final   Leukocytes,Ua 01/22/2024 NEGATIVE  NEGATIVE Final   Performed at [facility], [address] [place], [place] [address]   POC Amphetamine UR 01/22/2024 None Detected   Final   POC Secobarbital (BAR) 01/22/2024 None Detected   Final   POC Buprenorphine (BUP) 01/22/2024 None Detected   Final   POC Oxazepam (BZO) 01/22/2024 None Detected   Final   POC Cocaine UR 01/22/2024 None Detected   Final   POC Methamphetamine UR 01/22/2024 None Detected   Final   POC Morphine  01/22/2024 None Detected    Final   POC Methadone UR 01/22/2024 None Detected   Final   POC Oxycodone  UR 01/22/2024 None Detected   Final   POC Marijuana UR 01/22/2024 None Detected   Final  Admission on 12/24/2023, Discharged on 12/25/2023  Component Date Value Ref Range Status   Sodium 12/24/2023 144  135 - 145 mmol/L Final   Potassium 12/24/2023 3.8  3.5 - 5.1 mmol/L Final   Chloride 12/24/2023 107  98 - 111 mmol/L Final   CO2 12/24/2023 26  22 - 32 mmol/L Final   Glucose, Bld 12/24/2023 96  70 - 99 mg/dL Final   Glucose reference range applies only to samples taken after fasting for at least 8 hours.   BUN 12/24/2023 11  6 - 20 mg/dL Final   Creatinine, Ser 12/24/2023 0.62  0.61 - 1.24 mg/dL Final   Calcium  12/24/2023 8.6 (L)  8.9 - 10.3 mg/dL Final   Total Protein 97/72/7974 7.8  6.5 - 8.1 g/dL Final   Albumin 97/72/7974 4.3  3.5 - 5.0 g/dL Final   AST 97/72/7974 34  15 - 41 U/L Final   ALT 12/24/2023 27  0 - 44 U/L Final   Alkaline Phosphatase 12/24/2023 56  38 - 126 U/L Final   Total Bilirubin 12/24/2023 0.4  0.0 - 1.2 mg/dL Final   GFR, Estimated 12/24/2023 >60  >60 mL/min Final   Comment: (NOTE) Calculated using the CKD-EPI Creatinine Equation (2021)    Anion gap 12/24/2023 11  5 - 15 Final   Performed at [facility], [address] [place], [place] [address]   Alcohol, Ethyl (B) 12/24/2023 436 (HH)  <10 mg/dL Final   Comment: CRITICAL RESULT CALLED TO, READ BACK BY AND VERIFIED WITH CANDIE MOLT, RN 12/25/23 0050 BY K. DAVIS (NOTE) Lowest detectable limit for serum alcohol is 10 mg/dL.  For medical purposes only. Performed at [facility], [address], [place], [place] [address]    Opiates 12/25/2023 NONE DETECTED  NONE DETECTED Final   Cocaine 12/25/2023 NONE DETECTED  NONE DETECTED Final   Benzodiazepines 12/25/2023 NONE DETECTED  NONE DETECTED Final   Amphetamines 12/25/2023 NONE DETECTED  NONE DETECTED Final   Tetrahydrocannabinol 12/25/2023 NONE  DETECTED  NONE DETECTED Final   Barbiturates 12/25/2023 NONE DETECTED  NONE DETECTED Final   Comment: (NOTE) DRUG SCREEN FOR MEDICAL PURPOSES ONLY.  IF CONFIRMATION IS NEEDED FOR ANY PURPOSE, NOTIFY LAB WITHIN 5 DAYS.  LOWEST DETECTABLE LIMITS FOR URINE DRUG SCREEN Drug Class                     Cutoff (ng/mL) Amphetamine and metabolites    1000 Barbiturate and metabolites    200 Benzodiazepine                 200 Opiates and metabolites        300 Cocaine and metabolites        300 THC                            50 Performed at [facility], [address], [place], [place] [address]    WBC 12/24/2023 5.1  4.0 - 10.5 K/uL Final   RBC 12/24/2023 4.78  4.22 - 5.81 MIL/uL Final   Hemoglobin 12/24/2023 16.0  13.0 - 17.0 g/dL Final   HCT 97/72/7974 45.7  39.0 - 52.0 % Final   MCV 12/24/2023 95.6  80.0 - 100.0 fL Final   MCH 12/24/2023 33.5  26.0 - 34.0 pg Final   MCHC 12/24/2023 35.0  30.0 - 36.0 g/dL Final   RDW 97/72/7974 12.8  11.5 - 15.5 % Final   Platelets 12/24/2023 292  150 - 400 K/uL Final   nRBC 12/24/2023 0.0  0.0 - 0.2 % Final   Neutrophils Relative % 12/24/2023 61  % Final   Neutro Abs 12/24/2023 3.0  1.7 - 7.7 K/uL Final   Lymphocytes Relative 12/24/2023 31  % Final   Lymphs Abs 12/24/2023 1.6  0.7 - 4.0 K/uL Final   Monocytes Relative 12/24/2023 8  % Final   Monocytes Absolute 12/24/2023 0.4  0.1 - 1.0 K/uL Final   Eosinophils Relative 12/24/2023 0  % Final   Eosinophils Absolute 12/24/2023 0.0  0.0 - 0.5 K/uL Final   Basophils Relative 12/24/2023 0  % Final   Basophils Absolute 12/24/2023 0.0  0.0 - 0.1 K/uL Final   Immature Granulocytes 12/24/2023 0  % Final   Abs Immature Granulocytes 12/24/2023 0.02  0.00 - 0.07 K/uL Final   Performed at [facility], [address], [place], [place] [address]   Lipase 12/24/2023 41  11 - 51 U/L Final   Performed at [facility], [address] [place], [place]  [address]   Color, Urine 12/25/2023 YELLOW  YELLOW Final   APPearance 12/25/2023 HAZY (A)  CLEAR Final   Specific Gravity, Urine 12/25/2023 1.010  1.005 - 1.030 Final   pH 12/25/2023 7.0  5.0 - 8.0 Final   Glucose, UA 12/25/2023 NEGATIVE  NEGATIVE mg/dL Final   Hgb urine dipstick 12/25/2023 NEGATIVE  NEGATIVE Final   Bilirubin Urine 12/25/2023 NEGATIVE  NEGATIVE Final   Ketones, ur 12/25/2023 NEGATIVE  NEGATIVE mg/dL Final   Protein, ur 97/71/7974 NEGATIVE  NEGATIVE mg/dL Final   Nitrite 97/71/7974 NEGATIVE  NEGATIVE Final   Leukocytes,Ua 12/25/2023 NEGATIVE  NEGATIVE Final   Performed at [facility], [address], [place], [place] [address]    Allergies: Patient has no known allergies.  Medications:  Facility Ordered Medications  Medication   [COMPLETED] thiamine  (VITAMIN B1) injection 100 mg   hydrOXYzine  (ATARAX ) tablet 25 mg   loperamide  (IMODIUM ) capsule 2-4 mg   LORazepam  (ATIVAN ) tablet 1 mg   [COMPLETED] LORazepam  (ATIVAN ) tablet 1 mg   Followed by   [COMPLETED] LORazepam  (ATIVAN ) tablet 1 mg   Followed by   LORazepam  (ATIVAN ) tablet 1 mg   Followed by   NOREEN ON 05/17/2024] LORazepam  (ATIVAN ) tablet 1 mg   multivitamin with minerals tablet 1 tablet   ondansetron  (ZOFRAN -ODT) disintegrating tablet 4 mg   thiamine  (VITAMIN B1) tablet 100 mg   acetaminophen  (TYLENOL ) tablet 650 mg   alum & mag hydroxide-simeth (MAALOX/MYLANTA) 200-200-20 MG/5ML suspension 30 mL   magnesium  hydroxide (MILK OF MAGNESIA) suspension 30 mL   haloperidol  (HALDOL ) tablet 5 mg   And   diphenhydrAMINE  (BENADRYL ) capsule 50 mg   haloperidol  lactate (HALDOL ) injection 5 mg   And   diphenhydrAMINE  (BENADRYL ) injection 50 mg   And   LORazepam  (ATIVAN ) injection 2 mg   haloperidol  lactate (HALDOL ) injection 10 mg   And   diphenhydrAMINE  (BENADRYL ) injection 50 mg   And   LORazepam  (ATIVAN ) injection 2 mg   traZODone  (DESYREL ) tablet 50 mg   [START ON 05/17/2024] sertraline   (ZOLOFT ) tablet 50 mg   PTA Medications  Medication Sig   sertraline  (ZOLOFT ) 50 MG tablet Take 1 tablet (50 mg total) by mouth every morning.   valACYclovir  (VALTREX ) 500 MG tablet Take 500 mg by mouth daily as needed (For outbreaks).   hydrOXYzine  (ATARAX ) 50 MG tablet Take 50 mg by mouth daily as needed for anxiety.   Long Term Goals: Improvement in symptoms so as ready for discharge  Short Term Goals: Patient will verbalize feelings in meetings with treatment team members., Patient will attend at least of 50% of the groups daily., Pt will complete the PHQ9 on admission, day 3 and discharge., Patient will participate in completing the [place] Suicide Severity Rating Scale, Patient will score a low risk of violence for 24 hours prior to discharge, and Patient will take medications as prescribed daily.  Medical Decision Making  Treatment Plan Summary: Daily contact with patient to assess and evaluate symptoms and progress in treatment and Medication management  Safety and Monitoring: Voluntary admission to inpatient psychiatric unit for safety, stabilization and treatment Daily contact with patient to assess and evaluate symptoms and progress in treatment Patient's case to be discussed in multi-disciplinary team meeting Observation Level : q15 minute checks Vital signs: q12 hours Precautions: Safety Monitor for signs of withdrawal Abstinence from substances encouraged  SW to look into options for outpatient SA treatment at discharge   Long Term Goal(s): Improvement in symptoms so as ready for discharge  Short Term Goals: Ability to identify changes in lifestyle to reduce recurrence of condition will improve, Ability to verbalize feelings will improve,  Ability to disclose and discuss suicidal ideas, Ability to demonstrate self-control will improve, Ability to identify and develop effective coping behaviors will improve, Ability to maintain clinical measurements within normal limits will  improve, Compliance with prescribed medications will improve, and Ability to identify triggers associated with substance abuse/mental health issues will improve  Diagnoses Principal Problem:   Alcohol use disorder, severe, dependence (HCC)  Medications Continue Ativan  taper and  CIWA scores protocol-See MAR for details Oral thiamine  and MVI replacement as per the [facility] Continue Zoloft  50 mg daily for depressive symptoms and GAD & switch this medication to nighttime due to daytime grogginess.   PRNS -Continue Tylenol  650 mg every 6 hours PRN for mild pain -Continue Maalox 30 mg every 4 hrs PRN for indigestion -Continue Milk of Magnesia as needed every 6 hrs for constipation -Agitation protocol as per [facility] -Hydroxyzine  25 mg every 6 hours as needed -Continue Trazodone  50 mg nightly prn for sleep  Recommendations  Based on my evaluation the patient continues to meed the inpatient behavioral health unit criteria ([facility]) for treatment and stabilization, prior to sending to rehab for alcohol use disorder.  Discharge Planning: Social work and case management to assist with discharge planning and identification of hospital follow-up needs prior to discharge Estimated LOS: 5-7 days Discharge Concerns: Need to establish a safety plan; Medication compliance and effectiveness Discharge Goals: Return home with outpatient referrals for mental health follow-up including medication management/psychotherapy  Labs Reviewed: Ordered Ha1c, Vits B12, D, TSH, Lipi d panel, Mg. EKG WNL.   Total Time Spent in Direct Patient Care:  I personally spent 45 minutes on the unit in direct patient care. The direct patient care time included face-to-face time with the patient, reviewing the patient's chart, communicating with other professionals, and coordinating care. Greater than 50% of this time was spent in counseling or coordinating care with the patient regarding goals of hospitalization,  psycho-education, and discharge planning needs.    I certify that inpatient services furnished can reasonably be expected to improve the patient's condition.    Donia Snell, NP 05/16/24  1:57 PM

## 2024-05-16 NOTE — ED Notes (Signed)
 Patient reported having anxiety but denied depression. He said, I feel tired. He said, I have a little shaking. He has sleep disturbances. Nutrition intake adequate. Denied SI/HI, audiovisual hallucination, and N/V. He said, I sweat during the night. Calm and quiet.Med compliant. Nothing different from usual noted during this shift.

## 2024-05-16 NOTE — ED Notes (Signed)
 Patient got out of bed for breakfast and vital signs.  He is calm and pleasant on approach.  Tolerating detox protocol without issue.  Patient is CIWA-2 this morning for mild tremors.  Patient denies avh shi or plan.  Will monitor.

## 2024-05-16 NOTE — Group Note (Signed)
 Group Topic: Change and Accountability  Group Date: 05/16/2024 Start Time: 2100 End Time: 2200 Facilitators: Joan Plowman B  Department: [facility]  Number of Participants: 1  Group Focus: abuse issues, acceptance, anger management, anxiety, chemical dependency education, chemical dependency issues, co-dependency, daily focus, depression, diagnosis education, discharge education, and individual meeting Treatment Modality:  Individual Therapy Interventions utilized were leisure development Purpose: enhance coping skills, express feelings, express irrational fears, increase insight, and relapse prevention strategies  Name: Trevor [name] Date of Birth: [birth year]  MR: [id number]    Level of Participation: PT DID NOT ATTEND GROUPS Quality of Participation: cooperative Interactions with others: gave feedback Mood/Affect: appropriate Triggers (if applicable): NA Cognition: coherent/clear Progress: None Response: NA Plan: patient will be encouraged to go to groups.   Patients Problems:  Patient Active Problem List   Diagnosis Date Noted   Moderate episode of recurrent major depressive disorder (HCC) 01/30/2024   GAD (generalized anxiety disorder) 01/30/2024   Transaminitis 06/18/2022   GERD (gastroesophageal reflux disease) 06/18/2022   Hypokalemia 06/18/2022   Chronic hepatitis C without hepatic coma (HCC) 06/28/2019   Psoriasis 06/07/2019   H/O withdrawal symptoms, alcohol, with delirium and seizures (HCC) 06/07/2019   Alcohol use disorder, severe, dependence (HCC) 06/03/2019   Obesity (BMI 30-39.9) 01/09/2016   HSV-2 (herpes simplex virus 2) infection

## 2024-05-17 DIAGNOSIS — B182 Chronic viral hepatitis C: Secondary | ICD-10-CM | POA: Diagnosis not present

## 2024-05-17 DIAGNOSIS — A6002 Herpesviral infection of other male genital organs: Secondary | ICD-10-CM | POA: Diagnosis not present

## 2024-05-17 DIAGNOSIS — F1410 Cocaine abuse, uncomplicated: Secondary | ICD-10-CM | POA: Diagnosis not present

## 2024-05-17 DIAGNOSIS — F1020 Alcohol dependence, uncomplicated: Secondary | ICD-10-CM | POA: Diagnosis not present

## 2024-05-17 MED ORDER — LOPERAMIDE HCL 2 MG PO CAPS: 2.0000 mg | ORAL_CAPSULE | ORAL | Status: DC | PRN

## 2024-05-17 MED ORDER — CHLORDIAZEPOXIDE HCL 25 MG PO CAPS: 25.0000 mg | ORAL_CAPSULE | Freq: Every day | ORAL | Status: DC

## 2024-05-17 MED ORDER — ADULT MULTIVITAMIN W/MINERALS CH
1.0000 | ORAL_TABLET | Freq: Every day | ORAL | Status: DC
  Administered 2024-05-18 – 2024-05-20 (×3): 1 via ORAL
  Filled 2024-05-17 (×3): qty 1

## 2024-05-17 MED ORDER — CHLORDIAZEPOXIDE HCL 25 MG PO CAPS
25.0000 mg | ORAL_CAPSULE | ORAL | Status: AC
  Administered 2024-05-19 – 2024-05-20 (×2): 25 mg via ORAL
  Filled 2024-05-17 (×2): qty 1

## 2024-05-17 MED ORDER — HYDROXYZINE HCL 25 MG PO TABS
25.0000 mg | ORAL_TABLET | Freq: Four times a day (QID) | ORAL | Status: DC | PRN
  Administered 2024-05-17 – 2024-05-18 (×2): 25 mg via ORAL
  Filled 2024-05-17 (×2): qty 1

## 2024-05-17 MED ORDER — TRAZODONE HCL 100 MG PO TABS
100.0000 mg | ORAL_TABLET | Freq: Every evening | ORAL | Status: DC | PRN
  Administered 2024-05-17 – 2024-05-19 (×3): 100 mg via ORAL
  Filled 2024-05-17 (×4): qty 1

## 2024-05-17 MED ORDER — CHLORDIAZEPOXIDE HCL 25 MG PO CAPS: 25.0000 mg | ORAL_CAPSULE | Freq: Four times a day (QID) | ORAL | Status: DC | PRN

## 2024-05-17 MED ORDER — ONDANSETRON 4 MG PO TBDP: 4.0000 mg | ORAL_TABLET | Freq: Four times a day (QID) | ORAL | Status: DC | PRN

## 2024-05-17 MED ORDER — CHLORDIAZEPOXIDE HCL 25 MG PO CAPS
25.0000 mg | ORAL_CAPSULE | Freq: Four times a day (QID) | ORAL | Status: AC
  Administered 2024-05-17 – 2024-05-18 (×4): 25 mg via ORAL
  Filled 2024-05-17 (×4): qty 1

## 2024-05-17 MED ORDER — CHLORDIAZEPOXIDE HCL 25 MG PO CAPS
25.0000 mg | ORAL_CAPSULE | Freq: Three times a day (TID) | ORAL | Status: AC
  Administered 2024-05-18 – 2024-05-19 (×3): 25 mg via ORAL
  Filled 2024-05-17 (×3): qty 1

## 2024-05-17 MED ORDER — SERTRALINE HCL 100 MG PO TABS
100.0000 mg | ORAL_TABLET | Freq: Every day | ORAL | Status: DC
  Administered 2024-05-17 – 2024-05-19 (×3): 100 mg via ORAL
  Filled 2024-05-17 (×3): qty 1

## 2024-05-17 NOTE — Group Note (Signed)
 Group Topic: Change and Accountability  Group Date: 05/17/2024 Start Time: 0800 End Time: 0900 Facilitators: Lonzell Dwayne RAMAN, NT  Department: [facility]  Number of Participants: 7  Group Focus: acceptance Treatment Modality:  Behavior Modification Therapy Interventions utilized were problem solving Purpose: increase insight  Name: Trevor [name] Date of Birth: 29-Apr-1981  MR: [id number]    Level of Participation: Patient attended groupactive Quality of Participation: attentive Interactions with others: gave feedback Mood/Affect: positive Triggers (if applicable): N/A Cognition: coherent/clear Progress: Moderate Response: Appropriate  Plan: patient will be encouraged to continue with the I can attitude and he will do well, don't give up.  Patients Problems:  Patient Active Problem List   Diagnosis Date Noted   Moderate episode of recurrent major depressive disorder (HCC) 01/30/2024   GAD (generalized anxiety disorder) 01/30/2024   Transaminitis 06/18/2022   GERD (gastroesophageal reflux disease) 06/18/2022   Hypokalemia 06/18/2022   Chronic hepatitis C without hepatic coma (HCC) 06/28/2019   Psoriasis 06/07/2019   H/O withdrawal symptoms, alcohol, with delirium and seizures (HCC) 06/07/2019   Alcohol use disorder, severe, dependence (HCC) 06/03/2019   Obesity (BMI 30-39.9) 01/09/2016   HSV-2 (herpes simplex virus 2) infection

## 2024-05-17 NOTE — Group Note (Signed)
 Group Topic: Relapse and Recovery  Group Date: 05/16/2024 Start Time: 1205 End Time: 1230 Facilitators: Stanly Stabile, RN  Department: [facility]  Number of Participants: 5  Group Focus: check in, chemical dependency education, and chemical dependency issues Treatment Modality:  Behavior Modification Therapy Interventions utilized were clarification, exploration, patient education, and problem solving Purpose: enhance coping skills, explore maladaptive thinking, express feelings, express irrational fears, improve communication skills, increase insight, regain self-worth, reinforce self-care, and relapse prevention strategies  Name: Trevor [name] Date of Birth: 1981-09-18  MR: [id number]    Level of Participation: moderate Quality of Participation: attentive and cooperative Interactions with others: gave feedback Mood/Affect: appropriate Triggers (if applicable):   Cognition: coherent/clear Progress: Moderate Response:   Plan: follow-up needed  Patients Problems:  Patient Active Problem List   Diagnosis Date Noted   Moderate episode of recurrent major depressive disorder (HCC) 01/30/2024   GAD (generalized anxiety disorder) 01/30/2024   Transaminitis 06/18/2022   GERD (gastroesophageal reflux disease) 06/18/2022   Hypokalemia 06/18/2022   Chronic hepatitis C without hepatic coma (HCC) 06/28/2019   Psoriasis 06/07/2019   H/O withdrawal symptoms, alcohol, with delirium and seizures (HCC) 06/07/2019   Alcohol use disorder, severe, dependence (HCC) 06/03/2019   Obesity (BMI 30-39.9) 01/09/2016   HSV-2 (herpes simplex virus 2) infection

## 2024-05-17 NOTE — ED Notes (Signed)
 Patient asleep in bed. NAD. Will keep monitoring for safety.

## 2024-05-17 NOTE — Discharge Planning (Signed)
 SW spoke with Jan at [facility] and patient was reported to have completed the program successfully and can not return for 4 more months. SW sent additional referrals to [facility] and [facility]. SW left message for [place] at [place]. Will continue to follow.

## 2024-05-17 NOTE — ED Notes (Signed)
 Patient A&Ox4. Denies intent to harm self/others when asked. Denies A/VH. Patient denies any physical complaints when asked. No acute distress noted. Support and encouragement provided. Routine safety checks conducted according to facility protocol. Encouraged patient to notify staff if thoughts of harm toward self or others arise. Patient verbalize understanding and agreement. Will continue to monitor for safety.

## 2024-05-17 NOTE — ED Provider Notes (Addendum)
 Behavioral Health Progress Note   Date: 05/17/24 Patient Name: Trevor [name] MRN: [id number] Chief Complaint: Substance abuse detox.  Diagnoses:  Final diagnoses:  Cocaine use disorder (HCC)  Alcohol dependence with unspecified alcohol-induced disorder (HCC)  History of alcohol withdrawal delirium  History of seizure due to alcohol withdrawal  Herpes simplex type 2 infection  Chronic hepatitis C without hepatic coma (HCC)   HPI: Trevor [name] is a 43 y.o. male with a history of alcohol use disorder who presented to the [facility] on 7/18 seeking detoxification from alcohol.   As per initial assessment:  Patient reports he consumes up to 24, mini bottles, of liquor daily with last use yesterday when patient stated he drank, about 20 mini bottles, of liquor. Patient also reports doing 1 to 2 grams of cocaine 3 to 4 times a week with last use in the last 48 hours when patient reported he, did a gram. Patient states he last was in residential treatment 4 months ago at [facility] where he participated in a 28 day program and maintained his sobriety about a month before relapsing. Rosalita, Sherrell BRAVO, NP, 05/13/2024).   Patient assessment note, 05/17/2024: Patient reports that his physiological withdrawal symptoms are worse today as compared to yesterday.  Detox protocol will be completed on 07/22 at 10.00. Patient reports today that he is having vomiting, vomited x 2 earlier today morning, has chills, generalized malaise and body pains, face appears flushed. He states that he has had a seizure in the past a week after completing detox, and as per chart review, there is documentation of this.  Patient has a history of alcohol withdrawal seizures, and we are continuing to exhibit caution with his care, as his discharge might need to be prolonged than typical as initially stated yesterday, to ensure that there is no risk of alcohol related seizure activities.   Pt  remains pleasant & polite, but is also presenting with a very depressed mood. Thought contents are organized and logical, and pt continues to deny SI/HI/AVH or paranoia. There is no evidence of delusional thoughts. He is continuing to verbalize his motivation to regain his sobriety and maintain it by completing this detox and going to rehab thereafter in order to improve upon his quality of life.   Medication adjustments for today: -Restart patient on a Librium  taper to mitigate risk of seizure activity, since he has a history of seizures even after completing detox historically, and is currently having some symptoms that are concerning.  Patient is reporting crawling sensations to bilateral arms, and has moderate tremors to his bilateral hands, reports generalized body itching, and is presenting with nausea and vomiting today. -Supportive medications consisting of Robaxin, naproxen, Zofran , and Tylenol  also started as needed for patient's comfort. -Increased Zoloft  100 mg starting tonight for depressive symptoms and GAD due to complaints of lingering depressive symptoms and anxiety. -Increase trazodone  to 100 mg nightly due to complaints of insomnia.  Patient reports that he slept less than 2 hours last night.  CSW is working with patient to ensure that he transitions from our facility to the rehabilitation Center, with a goal of regaining his sobriety and maintaining it.  Patient is continuing to verbalize motivation to go to rehabilitation. He is continuing to benefit from his inpatient stay at this facility, as we continue to adjust his medications for treatment of withdrawal symptoms related to alcohol, depressive symptoms, and anxiety.  We will continue to follow.  Labs reviewed:  No new orders at this time   PHQ 2-9:  Flowsheet Row ED from 05/13/2024 in [facility] ED from 01/23/2024 in [facility] ED from 01/22/2024 in [facility]  Thoughts that you would be better off dead, or of hurting yourself in some way Several days Not at all Several days  PHQ-9 Total Score 9 14 12     Flowsheet Row ED from 05/13/2024 in [facility] Most recent reading at 05/13/2024  4:39 PM ED from 05/13/2024 in [facility] Most recent reading at 05/13/2024  3:20 PM ED from 01/23/2024 in [facility] Most recent reading at 01/23/2024 11:16 AM  C-SSRS RISK CATEGORY No Risk No Risk Low Risk    Screenings    Flowsheet Row Most Recent Value  CIWA-Ar Total 0    Total Time spent with patient: 45 minutes  Musculoskeletal  Strength & Muscle Tone: within normal limits Gait & Station: normal Patient leans: N/A  Psychiatric Specialty Exam  Presentation General Appearance:  Appropriate for Environment; Fairly Groomed  Eye Contact: Fair  Speech: Clear and Coherent  Speech Volume: Normal  Handedness: Right   Mood and Affect  Mood: Depressed; Anxious  Affect: Congruent   Thought Process  Thought Processes: Coherent  Descriptions of Associations:Intact  Orientation:Full (Time, Place and Person)  Thought Content:Logical  Diagnosis of Schizophrenia or Schizoaffective disorder in past: No   Hallucinations:Hallucinations: None  Ideas of Reference:None  Suicidal Thoughts:Suicidal Thoughts: No  Homicidal Thoughts:Homicidal Thoughts: No   Sensorium  Memory: Immediate Fair  Judgment: Fair  Insight: Fair   Art therapist  Concentration: Fair  Attention Span: Fair  Recall: [employer] of Knowledge: Fair  Language: Fair   Psychomotor Activity  Psychomotor Activity: Psychomotor Activity: Normal    Assets  Assets: Resilience   Sleep  Sleep: Sleep: Fair   No data recorded   Physical Exam Vitals and nursing note reviewed.  HENT:     Head: Normocephalic.  Musculoskeletal:         General: Normal range of motion.    Review of Systems  Psychiatric/Behavioral:  Positive for depression and substance abuse. Negative for hallucinations, memory loss and suicidal ideas. The patient is nervous/anxious and has insomnia.   All other systems reviewed and are negative.   Blood pressure 118/70, pulse 62, temperature 97.6 F (36.4 C), temperature source Oral, resp. rate 18, SpO2 98%. There is no height or weight on file to calculate BMI.  Is the patient at risk to self? Yes -Denies SI, but alcoholism renders him at high risk of danger to self and others. Has the patient been a risk to self in the past 6 months? Yes .    Has the patient been a risk to self within the distant past? No   Is the patient a risk to others? Yes   Has the patient been a risk to others in the past 6 months? Yes   Has the patient been a risk to others within the distant past? No   Past Medical History: See above  Family History: See above  Social History: See above   Last Labs:  Admission on 05/13/2024  Component Date Value Ref Range Status   Hgb A1c MFr Bld 05/15/2024 4.8  4.8 - 5.6 % Final   Comment: (NOTE) Diagnosis of Diabetes The following HbA1c ranges recommended by the American Diabetes Association (ADA) may be used as  an aid in the diagnosis of diabetes mellitus.  Hemoglobin             Suggested A1C NGSP%              Diagnosis  <5.7                   Non Diabetic  5.7-6.4                Pre-Diabetic  >6.4                   Diabetic  <7.0                   Glycemic control for                       adults with diabetes.     Mean Plasma Glucose 05/15/2024 91.06  mg/dL Final   Performed at [facility], [address], [place], [place] [address]   Cholesterol 05/15/2024 187  0 - 200 mg/dL Final   Triglycerides 92/79/7974 186 (H)  <150 mg/dL Final   HDL 92/79/7974 59  >40 mg/dL Final   Total CHOL/HDL Ratio 05/15/2024 3.2  RATIO Final   VLDL 05/15/2024 37  0 - 40  mg/dL Final   LDL Cholesterol 05/15/2024 91  0 - 99 mg/dL Final   Comment:        Total Cholesterol/HDL:CHD Risk Coronary Heart Disease Risk Table                     Men   Women  1/2 Average Risk   3.4   3.3  Average Risk       5.0   4.4  2 X Average Risk   9.6   7.1  3 X Average Risk  23.4   11.0        Use the calculated Patient Ratio above and the CHD Risk Table to determine the patient's CHD Risk.        ATP III CLASSIFICATION (LDL):  <100     mg/dL   Optimal  899-870  mg/dL   Near or Above                    Optimal  130-159  mg/dL   Borderline  839-810  mg/dL   High  >809     mg/dL   Very High Performed at [facility], [address], [place], [place] [address]    Vit D, 25-Hydroxy 05/15/2024 78.38  30 - 100 ng/mL Final   Comment: (NOTE) Vitamin D  deficiency has been defined by the Institute of Medicine  and an Endocrine Society practice guideline as a level of serum 25-OH  vitamin D  less than 20 ng/mL (1,2). The Endocrine Society went on to  further define vitamin D  insufficiency as a level between 21 and 29  ng/mL (2).  1. IOM (Institute of Medicine). 2010. Dietary reference intakes for  calcium  and D. [place]: The [employer]. 2. Holick MF, Binkley [place], Bischoff-Ferrari HA, et al. Evaluation,  treatment, and prevention of vitamin D  deficiency: an Endocrine  Society clinical practice guideline, JCEM. 2011 Jul; 96(7): 1911-30.  Performed at [facility], [address] [place] [address], [place], [place] [address]    Vitamin B-12 05/15/2024 265  180 - 914 pg/mL Final   Comment: (NOTE) This assay is not validated for testing neonatal or  myeloproliferative syndrome specimens for Vitamin B12 levels. Performed at [facility], [address] [place], [place] [address]    Magnesium  05/15/2024 1.9  1.7 - 2.4 mg/dL Final   Performed at [facility], [address], [place], [place] [address]   TSH 05/15/2024 1.255  0.350 - 4.500 uIU/mL  Final   Comment: Performed by a 3rd Generation assay with a functional sensitivity of <=0.01 uIU/mL. Performed at [facility], [address], [place], [place] [address]   Admission on 05/13/2024, Discharged on 05/13/2024  Component Date Value Ref Range Status   WBC 05/13/2024 8.3  4.0 - 10.5 K/uL Final   RBC 05/13/2024 5.19  4.22 - 5.81 MIL/uL Final   Hemoglobin 05/13/2024 16.7  13.0 - 17.0 g/dL Final   HCT 92/81/7974 48.0  39.0 - 52.0 % Final   MCV 05/13/2024 92.5  80.0 - 100.0 fL Final   MCH 05/13/2024 32.2  26.0 - 34.0 pg Final   MCHC 05/13/2024 34.8  30.0 - 36.0 g/dL Final   RDW 92/81/7974 13.5  11.5 - 15.5 % Final   Platelets 05/13/2024 329  150 - 400 K/uL Final   nRBC 05/13/2024 0.0  0.0 - 0.2 % Final   Neutrophils Relative % 05/13/2024 80  % Final   Neutro Abs 05/13/2024 6.6  1.7 - 7.7 K/uL Final   Lymphocytes Relative 05/13/2024 14  % Final   Lymphs Abs 05/13/2024 1.2  0.7 - 4.0 K/uL Final   Monocytes Relative 05/13/2024 5  % Final   Monocytes Absolute 05/13/2024 0.4  0.1 - 1.0 K/uL Final   Eosinophils Relative 05/13/2024 0  % Final   Eosinophils Absolute 05/13/2024 0.0  0.0 - 0.5 K/uL Final   Basophils Relative 05/13/2024 0  % Final   Basophils Absolute 05/13/2024 0.0  0.0 - 0.1 K/uL Final   Immature Granulocytes 05/13/2024 1  % Final   Abs Immature Granulocytes 05/13/2024 0.04  0.00 - 0.07 K/uL Final   Performed at [facility], [address], [place], [place] [address]   Sodium 05/13/2024 138  135 - 145 mmol/L Final   Potassium 05/13/2024 3.9  3.5 - 5.1 mmol/L Final   Chloride 05/13/2024 97 (L)  98 - 111 mmol/L Final   CO2 05/13/2024 30  22 - 32 mmol/L Final   Glucose, Bld 05/13/2024 103 (H)  70 - 99 mg/dL Final   Glucose reference range applies only to samples taken after fasting for at least 8 hours.   BUN 05/13/2024 8  6 - 20 mg/dL Final   Creatinine, Ser 05/13/2024 0.82  0.61 - 1.24 mg/dL Final   Calcium  05/13/2024 9.0  8.9 - 10.3 mg/dL Final    Total Protein 05/13/2024 7.1  6.5 - 8.1 g/dL Final   Albumin 92/81/7974 4.1  3.5 - 5.0 g/dL Final   AST 92/81/7974 23  15 - 41 U/L Final   ALT 05/13/2024 21  0 - 44 U/L Final   Alkaline Phosphatase 05/13/2024 80  38 - 126 U/L Final   Total Bilirubin 05/13/2024 0.8  0.0 - 1.2 mg/dL Final   GFR, Estimated 05/13/2024 >60  >60 mL/min Final   Comment: (NOTE) Calculated using the CKD-EPI Creatinine Equation (2021)    Anion gap 05/13/2024 11  5 - 15 Final   Performed at [facility], [address], [place], [place] [address]   Magnesium  05/13/2024 1.7  1.7 - 2.4 mg/dL Final   Performed at [facility], [address], [place], [place] [address]   Alcohol, Ethyl (B) 05/13/2024 52 (H)  <15 mg/dL Final   Comment: (NOTE) For medical purposes only. Performed at [facility], [address] [place], [place] [address]    POC Amphetamine UR 05/13/2024 None Detected  NONE DETECTED (Cut Off Level 1000 ng/mL) Final   POC Secobarbital (BAR) 05/13/2024 None Detected  NONE DETECTED (Cut Off Level 300 ng/mL) Final   POC Buprenorphine (BUP) 05/13/2024 None Detected  NONE DETECTED (Cut Off Level 10 ng/mL) Final   POC Oxazepam (BZO) 05/13/2024 None Detected  NONE DETECTED (Cut Off Level 300 ng/mL) Final   POC Cocaine UR 05/13/2024 None Detected  NONE DETECTED (Cut Off Level 300 ng/mL) Final   POC Methamphetamine UR 05/13/2024 None Detected  NONE DETECTED (Cut Off Level 1000 ng/mL) Final   POC Morphine  05/13/2024 None Detected  NONE DETECTED (Cut Off Level 300 ng/mL) Final   POC Methadone UR 05/13/2024 None Detected  NONE DETECTED (Cut Off Level 300 ng/mL) Final   POC Oxycodone  UR 05/13/2024 None Detected  NONE DETECTED (Cut Off Level 100 ng/mL) Final   POC Marijuana UR 05/13/2024 None Detected  NONE DETECTED (Cut Off Level 50 ng/mL) Final  Admission on 01/23/2024, Discharged on 02/02/2024  Component Date Value Ref Range Status   Sodium 01/30/2024 137  135 - 145 mmol/L Final   Potassium  01/30/2024 4.1  3.5 - 5.1 mmol/L Final   Chloride 01/30/2024 101  98 - 111 mmol/L Final   CO2 01/30/2024 30  22 - 32 mmol/L Final   Glucose, Bld 01/30/2024 92  70 - 99 mg/dL Final   Glucose reference range applies only to samples taken after fasting for at least 8 hours.   BUN 01/30/2024 17  6 - 20 mg/dL Final   Creatinine, Ser 01/30/2024 1.03  0.61 - 1.24 mg/dL Final   Calcium  01/30/2024 8.9  8.9 - 10.3 mg/dL Final   Total Protein 95/94/7974 7.2  6.5 - 8.1 g/dL Final   Albumin 95/94/7974 4.1  3.5 - 5.0 g/dL Final   AST 95/94/7974 18  15 - 41 U/L Final   ALT 01/30/2024 19  0 - 44 U/L Final   Alkaline Phosphatase 01/30/2024 43  38 - 126 U/L Final   Total Bilirubin 01/30/2024 0.6  0.0 - 1.2 mg/dL Final   GFR, Estimated 01/30/2024 >60  >60 mL/min Final   Comment: (NOTE) Calculated using the CKD-EPI Creatinine Equation (2021)    Anion gap 01/30/2024 6  5 - 15 Final   Performed at [facility], [address] [place], [place] [address]  Admission on 01/22/2024, Discharged on 01/23/2024  Component Date Value Ref Range Status   WBC 01/22/2024 6.8  4.0 - 10.5 K/uL Final   RBC 01/22/2024 5.22  4.22 - 5.81 MIL/uL Final   Hemoglobin 01/22/2024 17.2 (H)  13.0 - 17.0 g/dL Final   HCT 96/71/7974 47.8  39.0 - 52.0 % Final   MCV 01/22/2024 91.6  80.0 - 100.0 fL Final   MCH 01/22/2024 33.0  26.0 - 34.0 pg Final   MCHC 01/22/2024 36.0  30.0 - 36.0 g/dL Final   RDW 96/71/7974 12.1  11.5 - 15.5 % Final   Platelets 01/22/2024 319  150 - 400 K/uL Final   nRBC 01/22/2024 0.0  0.0 - 0.2 % Final   Neutrophils Relative % 01/22/2024 70  % Final   Neutro Abs 01/22/2024 4.8  1.7 - 7.7 K/uL Final  Lymphocytes Relative 01/22/2024 25  % Final   Lymphs Abs 01/22/2024 1.7  0.7 - 4.0 K/uL Final   Monocytes Relative 01/22/2024 4  % Final   Monocytes Absolute 01/22/2024 0.3  0.1 - 1.0 K/uL Final   Eosinophils Relative 01/22/2024 0  % Final   Eosinophils Absolute 01/22/2024 0.0  0.0 - 0.5 K/uL Final    Basophils Relative 01/22/2024 1  % Final   Basophils Absolute 01/22/2024 0.1  0.0 - 0.1 K/uL Final   Immature Granulocytes 01/22/2024 0  % Final   Abs Immature Granulocytes 01/22/2024 0.02  0.00 - 0.07 K/uL Final   Performed at [facility], [address] [place], [place] [address]   Sodium 01/22/2024 138  135 - 145 mmol/L Final   Potassium 01/22/2024 3.9  3.5 - 5.1 mmol/L Final   Chloride 01/22/2024 99  98 - 111 mmol/L Final   CO2 01/22/2024 25  22 - 32 mmol/L Final   Glucose, Bld 01/22/2024 99  70 - 99 mg/dL Final   Glucose reference range applies only to samples taken after fasting for at least 8 hours.   BUN 01/22/2024 13  6 - 20 mg/dL Final   Creatinine, Ser 01/22/2024 0.83  0.61 - 1.24 mg/dL Final   Calcium  01/22/2024 8.9  8.9 - 10.3 mg/dL Final   Total Protein 96/71/7974 7.5  6.5 - 8.1 g/dL Final   Albumin 96/71/7974 4.6  3.5 - 5.0 g/dL Final   AST 96/71/7974 44 (H)  15 - 41 U/L Final   ALT 01/22/2024 46 (H)  0 - 44 U/L Final   Alkaline Phosphatase 01/22/2024 60  38 - 126 U/L Final   Total Bilirubin 01/22/2024 0.8  0.0 - 1.2 mg/dL Final   GFR, Estimated 01/22/2024 >60  >60 mL/min Final   Comment: (NOTE) Calculated using the CKD-EPI Creatinine Equation (2021)    Anion gap 01/22/2024 14  5 - 15 Final   Performed at [facility], [address], [place], [place] [address]   Hgb A1c MFr Bld 01/22/2024 5.0  4.8 - 5.6 % Final   Comment: (NOTE) Pre diabetes:          5.7%-6.4%  Diabetes:              >6.4%  Glycemic control for   <7.0% adults with diabetes    Mean Plasma Glucose 01/22/2024 96.8  mg/dL Final   Performed at [facility], [address] [place], [place] [address]   Magnesium  01/22/2024 1.9  1.7 - 2.4 mg/dL Final   Performed at [facility], [address] [place], [place] [address]   Alcohol, Ethyl (B) 01/22/2024 109 (H)  <10 mg/dL Final   Comment: (NOTE) Lowest detectable limit for serum alcohol is 10 mg/dL.  For medical purposes  only. Performed at [facility], [address] [place], [place] [address]    Cholesterol 01/22/2024 202 (H)  0 - 200 mg/dL Final   Triglycerides 96/71/7974 65  <150 mg/dL Final   HDL 96/71/7974 84  >40 mg/dL Final   Total CHOL/HDL Ratio 01/22/2024 2.4  RATIO Final   VLDL 01/22/2024 13  0 - 40 mg/dL Final   LDL Cholesterol 01/22/2024 105 (H)  0 - 99 mg/dL Final   Comment:        Total Cholesterol/HDL:CHD Risk Coronary Heart Disease Risk Table  Men   Women  1/2 Average Risk   3.4   3.3  Average Risk       5.0   4.4  2 X Average Risk   9.6   7.1  3 X Average Risk  23.4   11.0        Use the calculated Patient Ratio above and the CHD Risk Table to determine the patient's CHD Risk.        ATP III CLASSIFICATION (LDL):  <100     mg/dL   Optimal  899-870  mg/dL   Near or Above                    Optimal  130-159  mg/dL   Borderline  839-810  mg/dL   High  >809     mg/dL   Very High Performed at [facility], [address], [place], [place] [address]    TSH 01/22/2024 0.458  0.350 - 4.500 uIU/mL Final   Comment: Performed by a 3rd Generation assay with a functional sensitivity of <=0.01 uIU/mL. Performed at [facility], [address] [place], [place] [address]    RPR Ser Ql 01/22/2024 NON REACTIVE  NON REACTIVE Final   Performed at [facility], [address] [place], [place] [address]   Color, Urine 01/22/2024 YELLOW  YELLOW Final   APPearance 01/22/2024 CLEAR  CLEAR Final   Specific Gravity, Urine 01/22/2024 1.020  1.005 - 1.030 Final   pH 01/22/2024 5.0  5.0 - 8.0 Final   Glucose, UA 01/22/2024 NEGATIVE  NEGATIVE mg/dL Final   Hgb urine dipstick 01/22/2024 NEGATIVE  NEGATIVE Final   Bilirubin Urine 01/22/2024 NEGATIVE  NEGATIVE Final   Ketones, ur 01/22/2024 5 (A)  NEGATIVE mg/dL Final   Protein, ur 96/71/7974 NEGATIVE  NEGATIVE mg/dL Final   Nitrite 96/71/7974 NEGATIVE  NEGATIVE Final   Leukocytes,Ua 01/22/2024 NEGATIVE   NEGATIVE Final   Performed at [facility], [address], [place], [place] [address]   POC Amphetamine UR 01/22/2024 None Detected   Final   POC Secobarbital (BAR) 01/22/2024 None Detected   Final   POC Buprenorphine (BUP) 01/22/2024 None Detected   Final   POC Oxazepam (BZO) 01/22/2024 None Detected   Final   POC Cocaine UR 01/22/2024 None Detected   Final   POC Methamphetamine UR 01/22/2024 None Detected   Final   POC Morphine  01/22/2024 None Detected   Final   POC Methadone UR 01/22/2024 None Detected   Final   POC Oxycodone  UR 01/22/2024 None Detected   Final   POC Marijuana UR 01/22/2024 None Detected   Final  Admission on 12/24/2023, Discharged on 12/25/2023  Component Date Value Ref Range Status   Sodium 12/24/2023 144  135 - 145 mmol/L Final   Potassium 12/24/2023 3.8  3.5 - 5.1 mmol/L Final   Chloride 12/24/2023 107  98 - 111 mmol/L Final   CO2 12/24/2023 26  22 - 32 mmol/L Final   Glucose, Bld 12/24/2023 96  70 - 99 mg/dL Final   Glucose reference range applies only to samples taken after fasting for at least 8 hours.   BUN 12/24/2023 11  6 - 20 mg/dL Final   Creatinine, Ser 12/24/2023 0.62  0.61 - 1.24 mg/dL Final   Calcium  12/24/2023 8.6 (L)  8.9 - 10.3 mg/dL Final   Total Protein 97/72/7974 7.8  6.5 - 8.1 g/dL Final   Albumin 97/72/7974 4.3  3.5 -  5.0 g/dL Final   AST 97/72/7974 34  15 - 41 U/L Final   ALT 12/24/2023 27  0 - 44 U/L Final   Alkaline Phosphatase 12/24/2023 56  38 - 126 U/L Final   Total Bilirubin 12/24/2023 0.4  0.0 - 1.2 mg/dL Final   GFR, Estimated 12/24/2023 >60  >60 mL/min Final   Comment: (NOTE) Calculated using the CKD-EPI Creatinine Equation (2021)    Anion gap 12/24/2023 11  5 - 15 Final   Performed at [facility], [address], [place], [place] [address]   Alcohol, Ethyl (B) 12/24/2023 436 (HH)  <10 mg/dL Final   Comment: CRITICAL RESULT CALLED TO, READ BACK BY AND VERIFIED WITH CANDIE MOLT, RN 12/25/23 0050 BY K.  DAVIS (NOTE) Lowest detectable limit for serum alcohol is 10 mg/dL.  For medical purposes only. Performed at [facility], [address] [place], [place] [address]    Opiates 12/25/2023 NONE DETECTED  NONE DETECTED Final   Cocaine 12/25/2023 NONE DETECTED  NONE DETECTED Final   Benzodiazepines 12/25/2023 NONE DETECTED  NONE DETECTED Final   Amphetamines 12/25/2023 NONE DETECTED  NONE DETECTED Final   Tetrahydrocannabinol 12/25/2023 NONE DETECTED  NONE DETECTED Final   Barbiturates 12/25/2023 NONE DETECTED  NONE DETECTED Final   Comment: (NOTE) DRUG SCREEN FOR MEDICAL PURPOSES ONLY.  IF CONFIRMATION IS NEEDED FOR ANY PURPOSE, NOTIFY LAB WITHIN 5 DAYS.  LOWEST DETECTABLE LIMITS FOR URINE DRUG SCREEN Drug Class                     Cutoff (ng/mL) Amphetamine and metabolites    1000 Barbiturate and metabolites    200 Benzodiazepine                 200 Opiates and metabolites        300 Cocaine and metabolites        300 THC                            50 Performed at [facility], [address], [place], [place] [address]    WBC 12/24/2023 5.1  4.0 - 10.5 K/uL Final   RBC 12/24/2023 4.78  4.22 - 5.81 MIL/uL Final   Hemoglobin 12/24/2023 16.0  13.0 - 17.0 g/dL Final   HCT 97/72/7974 45.7  39.0 - 52.0 % Final   MCV 12/24/2023 95.6  80.0 - 100.0 fL Final   MCH 12/24/2023 33.5  26.0 - 34.0 pg Final   MCHC 12/24/2023 35.0  30.0 - 36.0 g/dL Final   RDW 97/72/7974 12.8  11.5 - 15.5 % Final   Platelets 12/24/2023 292  150 - 400 K/uL Final   nRBC 12/24/2023 0.0  0.0 - 0.2 % Final   Neutrophils Relative % 12/24/2023 61  % Final   Neutro Abs 12/24/2023 3.0  1.7 - 7.7 K/uL Final   Lymphocytes Relative 12/24/2023 31  % Final   Lymphs Abs 12/24/2023 1.6  0.7 - 4.0 K/uL Final   Monocytes Relative 12/24/2023 8  % Final   Monocytes Absolute 12/24/2023 0.4  0.1 - 1.0 K/uL Final   Eosinophils Relative 12/24/2023 0  % Final   Eosinophils Absolute  12/24/2023 0.0  0.0 - 0.5 K/uL Final   Basophils Relative 12/24/2023 0  % Final   Basophils Absolute 12/24/2023 0.0  0.0 - 0.1 K/uL Final   Immature Granulocytes 12/24/2023 0  % Final  Abs Immature Granulocytes 12/24/2023 0.02  0.00 - 0.07 K/uL Final   Performed at [facility], [address] [place], [place] [address]   Lipase 12/24/2023 41  11 - 51 U/L Final   Performed at [facility], [address], [place], [place] [address]   Color, Urine 12/25/2023 YELLOW  YELLOW Final   APPearance 12/25/2023 HAZY (A)  CLEAR Final   Specific Gravity, Urine 12/25/2023 1.010  1.005 - 1.030 Final   pH 12/25/2023 7.0  5.0 - 8.0 Final   Glucose, UA 12/25/2023 NEGATIVE  NEGATIVE mg/dL Final   Hgb urine dipstick 12/25/2023 NEGATIVE  NEGATIVE Final   Bilirubin Urine 12/25/2023 NEGATIVE  NEGATIVE Final   Ketones, ur 12/25/2023 NEGATIVE  NEGATIVE mg/dL Final   Protein, ur 97/71/7974 NEGATIVE  NEGATIVE mg/dL Final   Nitrite 97/71/7974 NEGATIVE  NEGATIVE Final   Leukocytes,Ua 12/25/2023 NEGATIVE  NEGATIVE Final   Performed at [facility], [address], [place], [place] [address]    Allergies: Patient has no known allergies.  Medications:  Facility Ordered Medications  Medication   [COMPLETED] thiamine  (VITAMIN B1) injection 100 mg   [EXPIRED] hydrOXYzine  (ATARAX ) tablet 25 mg   [EXPIRED] loperamide  (IMODIUM ) capsule 2-4 mg   [EXPIRED] LORazepam  (ATIVAN ) tablet 1 mg   [COMPLETED] LORazepam  (ATIVAN ) tablet 1 mg   Followed by   [COMPLETED] LORazepam  (ATIVAN ) tablet 1 mg   Followed by   [EXPIRED] LORazepam  (ATIVAN ) tablet 1 mg   Followed by   [COMPLETED] LORazepam  (ATIVAN ) tablet 1 mg   [EXPIRED] ondansetron  (ZOFRAN -ODT) disintegrating tablet 4 mg   thiamine  (VITAMIN B1) tablet 100 mg   acetaminophen  (TYLENOL ) tablet 650 mg   alum & mag hydroxide-simeth (MAALOX/MYLANTA) 200-200-20 MG/5ML suspension 30 mL   magnesium  hydroxide (MILK OF  MAGNESIA) suspension 30 mL   haloperidol  (HALDOL ) tablet 5 mg   And   diphenhydrAMINE  (BENADRYL ) capsule 50 mg   haloperidol  lactate (HALDOL ) injection 5 mg   And   diphenhydrAMINE  (BENADRYL ) injection 50 mg   And   LORazepam  (ATIVAN ) injection 2 mg   haloperidol  lactate (HALDOL ) injection 10 mg   And   diphenhydrAMINE  (BENADRYL ) injection 50 mg   And   LORazepam  (ATIVAN ) injection 2 mg   sertraline  (ZOLOFT ) tablet 100 mg   traZODone  (DESYREL ) tablet 100 mg   [START ON 05/18/2024] multivitamin with minerals tablet 1 tablet   chlordiazePOXIDE  (LIBRIUM ) capsule 25 mg   hydrOXYzine  (ATARAX ) tablet 25 mg   ondansetron  (ZOFRAN -ODT) disintegrating tablet 4 mg   chlordiazePOXIDE  (LIBRIUM ) capsule 25 mg   Followed by   NOREEN ON 05/18/2024] chlordiazePOXIDE  (LIBRIUM ) capsule 25 mg   Followed by   NOREEN ON 05/19/2024] chlordiazePOXIDE  (LIBRIUM ) capsule 25 mg   Followed by   NOREEN ON 05/20/2024] chlordiazePOXIDE  (LIBRIUM ) capsule 25 mg   loperamide  (IMODIUM ) capsule 2-4 mg   PTA Medications  Medication Sig   sertraline  (ZOLOFT ) 50 MG tablet Take 1 tablet (50 mg total) by mouth every morning.   valACYclovir  (VALTREX ) 500 MG tablet Take 500 mg by mouth daily as needed (For outbreaks).   hydrOXYzine  (ATARAX ) 50 MG tablet Take 50 mg by mouth daily as needed for anxiety.   Long Term Goals: Improvement in symptoms so as ready for discharge  Short Term Goals: Patient will verbalize feelings in meetings with treatment team members., Patient will attend at least of 50% of the groups daily., Pt will complete the PHQ9 on admission, day 3 and discharge., Patient will participate in  completing the [place] Suicide Severity Rating Scale, Patient will score a low risk of violence for 24 hours prior to discharge, and Patient will take medications as prescribed daily.  Medical Decision Making  Treatment Plan Summary: Daily contact with patient to assess and evaluate symptoms and progress in treatment and  Medication management  Safety and Monitoring: Voluntary admission to inpatient psychiatric unit for safety, stabilization and treatment Daily contact with patient to assess and evaluate symptoms and progress in treatment Patient's case to be discussed in multi-disciplinary team meeting Observation Level : q15 minute checks Vital signs: q12 hours Precautions: Safety Monitor for signs of withdrawal Abstinence from substances encouraged  SW to look into options for outpatient SA treatment at discharge   Long Term Goal(s): Improvement in symptoms so as ready for discharge  Short Term Goals: Ability to identify changes in lifestyle to reduce recurrence of condition will improve, Ability to verbalize feelings will improve, Ability to disclose and discuss suicidal ideas, Ability to demonstrate self-control will improve, Ability to identify and develop effective coping behaviors will improve, Ability to maintain clinical measurements within normal limits will improve, Compliance with prescribed medications will improve, and Ability to identify triggers associated with substance abuse/mental health issues will improve  Diagnoses Principal Problem:   Alcohol use disorder, severe, dependence (HCC)  Medications - Ativan  taper in late earlier today morning 10 AM. -Started Librium  taper-please see MAR for details -Oral thiamine  and MVI replacement as per the [facility] -See Other medication adjustments in the body of note above.   PRNS -Continue Tylenol  650 mg every 6 hours PRN for mild pain -Continue Maalox 30 mg every 4 hrs PRN for indigestion -Continue Milk of Magnesia as needed every 6 hrs for constipation -Agitation protocol as per the [facility] -Hydroxyzine  25 mg every 6 hours as needed -Continue Trazodone  50 mg nightly prn for sleep  Recommendations  Based on my evaluation the patient continues to meed the inpatient behavioral health unit criteria ([facility]) for treatment and  stabilization, prior to sending to rehab for alcohol use disorder.  Discharge Planning: Social work and case management to assist with discharge planning and identification of hospital follow-up needs prior to discharge Estimated LOS: 5-7 days Discharge Concerns: Need to establish a safety plan; Medication compliance and effectiveness Discharge Goals: Return home with outpatient referrals for mental health follow-up including medication management/psychotherapy  Labs Reviewed: Ordered Ha1c, Vits B12, D, TSH, Lipi d panel, Mg. EKG WNL.   Total Time Spent in Direct Patient Care:  I personally spent 45 minutes on the unit in direct patient care. The direct patient care time included face-to-face time with the patient, reviewing the patient's chart, communicating with other professionals, and coordinating care. Greater than 50% of this time was spent in counseling or coordinating care with the patient regarding goals of hospitalization, psycho-education, and discharge planning needs.    I certify that inpatient services furnished can reasonably be expected to improve the patient's condition.    Donia Snell, NP 05/17/24  4:57 PM

## 2024-05-17 NOTE — Group Note (Signed)
 Group Topic: Communication  Group Date: 05/17/2024 Start Time: 2000 End Time: 2100 Facilitators: Joan Plowman B  Department: [facility]  Number of Participants: 5  Group Focus: abuse issues and activities of daily living skills Treatment Modality:  Individual Therapy Interventions utilized were leisure development Purpose: enhance coping skills, express feelings, relapse prevention strategies, and trigger / craving management  Name: Trevor [name] Date of Birth: 01/30/1981  MR: [id number]    Level of Participation: active Quality of Participation: cooperative Interactions with others: gave feedback Mood/Affect: appropriate and brightens with interaction Triggers (if applicable): NA Cognition: coherent/clear Progress: Gaining insight Response: he was with someone that made him use again and he knows to get away from her and people like that in order to get better himself.  Plan: patient will be encouraged to go to groups.   Patients Problems:  Patient Active Problem List   Diagnosis Date Noted   Moderate episode of recurrent major depressive disorder (HCC) 01/30/2024   GAD (generalized anxiety disorder) 01/30/2024   Transaminitis 06/18/2022   GERD (gastroesophageal reflux disease) 06/18/2022   Hypokalemia 06/18/2022   Chronic hepatitis C without hepatic coma (HCC) 06/28/2019   Psoriasis 06/07/2019   H/O withdrawal symptoms, alcohol, with delirium and seizures (HCC) 06/07/2019   Alcohol use disorder, severe, dependence (HCC) 06/03/2019   Obesity (BMI 30-39.9) 01/09/2016   HSV-2 (herpes simplex virus 2) infection

## 2024-05-17 NOTE — ED Notes (Signed)
 Pt sitting in dayroom watching television and interacting with peers. No acute distress noted. No concerns voiced. Informed pt to notify staff with any needs or assistance. Pt verbalized understanding and agreement. Will continue to monitor for safety.

## 2024-05-17 NOTE — ED Notes (Signed)
 Pt asleep at this time, CIWA assessment could not be completed.

## 2024-05-17 NOTE — Group Note (Signed)
 Group Topic: Relapse and Recovery  Group Date: 05/17/2024 Start Time: 9089 End Time: 1000 Facilitators: Herold Lajuana NOVAK, RN  Department: [facility]  Number of Participants: 6  Group Focus: coping skills Treatment Modality:  Leisure Development Interventions utilized were leisure development Purpose: relapse prevention strategies  Name: Trevor [name] Date of Birth: July 08, 1981  MR: [id number]    Level of Participation: active Quality of Participation: cooperative Interactions with others: gave feedback Mood/Affect: appropriate Triggers (if applicable): none identified Cognition: coherent/clear Progress: Significant Response: My coping skill is to go running or working out Plan: patient will be encouraged to utilize recognized coping skills to manage cravings  Patients Problems:  Patient Active Problem List   Diagnosis Date Noted   Moderate episode of recurrent major depressive disorder (HCC) 01/30/2024   GAD (generalized anxiety disorder) 01/30/2024   Transaminitis 06/18/2022   GERD (gastroesophageal reflux disease) 06/18/2022   Hypokalemia 06/18/2022   Chronic hepatitis C without hepatic coma (HCC) 06/28/2019   Psoriasis 06/07/2019   H/O withdrawal symptoms, alcohol, with delirium and seizures (HCC) 06/07/2019   Alcohol use disorder, severe, dependence (HCC) 06/03/2019   Obesity (BMI 30-39.9) 01/09/2016   HSV-2 (herpes simplex virus 2) infection

## 2024-05-18 DIAGNOSIS — A6002 Herpesviral infection of other male genital organs: Secondary | ICD-10-CM | POA: Diagnosis not present

## 2024-05-18 DIAGNOSIS — B182 Chronic viral hepatitis C: Secondary | ICD-10-CM | POA: Diagnosis not present

## 2024-05-18 DIAGNOSIS — F1410 Cocaine abuse, uncomplicated: Secondary | ICD-10-CM | POA: Diagnosis not present

## 2024-05-18 DIAGNOSIS — F1020 Alcohol dependence, uncomplicated: Secondary | ICD-10-CM | POA: Diagnosis not present

## 2024-05-18 NOTE — ED Notes (Signed)
 Pt is in the dayroom watching TV with peers. Pt denies SI/HI/AVH. Pt has no further complain.No acute distress noted.

## 2024-05-18 NOTE — ED Notes (Signed)
 Patient is sleeping. Respirations equal and unlabored. No change in assessment or acuity. Routine safety checks conducted according to facility protocol.

## 2024-05-18 NOTE — ED Notes (Signed)
 Pt sleeping in no acute distress. RR even and unlabored. Environment secured. Will continue to monitor for safety.

## 2024-05-18 NOTE — Discharge Instructions (Addendum)
 Based on the information that you have provided and the presenting issues outpatient services and resources for have been recommended. It is imperative that you follow through with treatment recommendations within 5-7 days from the of discharge to mitigate further risk to your safety and mental well-being. A list of referrals has been provided below to get you started.  You are not limited to the list provided.  In case of an urgent crisis, you may contact the Mobile Crisis Unit with [facility] at [phone].  You are scheduled for an assessment on Monday July 28 @ 1:00. Please arrive 20 minutes early and bring your ID and insurance card.   Non-Emergent / Urgent   [facility] [address], SECOND FLOOR [place], [place] [address] [phone] OUTPATIENT Walk-in information: Please note, all walk-ins are first come & first serve, with limited number of availability.   Please note that to be eligible for services you must bring: ID or a piece of mail with your name [facility] address   Therapist for therapy:  Monday & Wednesdays: Please ARRIVE at 7:15 AM for registration Will START at 8:00 AM Every 1st & 2nd Friday of the month: Please ARRIVE at 10:15 AM for registration Will START at 1 PM - 5 PM   Psychiatrist for medication management: Monday - Friday:  Please ARRIVE at 7:15 AM for registration Will START at 8:00 AM   Medications: Patient is to take medications as prescribed. The patient or patient's guardian is to contact a medical professional and/or outpatient provider to address any new side effects that develop. The patient or the patient's guardian should update outpatient providers of any new medications and/or medication changes.   Safety:   The following safety precautions should be taken:   No sharp objects. This includes scissors, razors, scrapers, and putty knives.   Chemicals should be removed and locked up.    Medications should be removed and locked up.   Weapons should be removed and locked up. This includes firearms, knives and instruments that can be used to cause injury.   The patient should abstain from use of illicit substances/drugs and abuse of any medications.  If symptoms worsen or do not continue to improve or if the patient becomes actively suicidal or homicidal then it is recommended that the patient return to the closest hospital emergency department, the [facility], or call 911 for further evaluation and treatment. National Suicide Prevention Lifeline 1-800-SUICIDE or 856-846-3182.  About 988 988 offers 24/7 access to trained crisis counselors who can help people experiencing mental health-related distress. People can call or text 988 or chat 988lifeline.org for themselves or if they are worried about a loved one who may need crisis support.                  Residential Treatment  Facilities Medicaid Detox No Insurance Engineer, site ([facility]) [address] [place], [place] [phone] or [phone]   No  Yes  Yes  Yes   [facility] [phone] [place], [place] [address] [phone] Admissions: 8am-3pm  M-F   [place] only  No  Yes  No    [facility] [phone]   No  Yes  No- out of pocket 16,000  Yes   [facility]) [address] [place], [place] [phone]   Yes- No medicare  Yes   Yes, [place]   BCBS only    [employer] [address] [place], [place], [address] [phone]   No  No  Yes but private pay, offers some sponsorships  Does not take insurance    [facility] [place], [place] [phone]       No  No  Yes- [place] out of pocket if not in those counties. 3,220.00 for 28 days.   No   Residential Treatment   Facilities   Medicaid  Detox  No IT trainer (multiple locations throughout the country)  Intake: [phone]    No   Yes   No   Yes   [facility]  [facility] [place], [place] [phone] (takes everyone as long as they meet detox criteria)   Yes  Yes   Yes  Yes   [address] [place], [place]  [phone] 27 locations    No  No- sober living house  90.00-130.00 per week per person  [facility] Part of [place] Outreach [phone] [email or web address] [facility] Part of [place] Outreach [phone] [email or web address]    No   [facility] [address]  [place] [phone] [email or web address]     No  No  1,200.00 a 200.00 deposit is required at start of treatment Payment plans accepted [place] based program  No   Residential Treatment  Facilities   Medicaid  Detox  No [facility] [address]  [place], [place], [address] [phone]  No  Yes       Yes  Regular Rehab: 7,500.00 28 days Dual Diagnosis: 8,900.00 28 days 7 day detox: 2.700.00 or 3,400.00 for Dual Diagnosis   Yes    The residential programs referrals were sent to were [facility] in [place] [phone]- [address] [place]), [facility] [phone]- [place]), [place] [phone]).

## 2024-05-18 NOTE — Group Note (Signed)
 Group Topic: Feelings about Diagnosis  Group Date: 05/18/2024 Start Time: 2000 End Time: 2130 Facilitators: Joan Plowman B  Department: [facility]  Number of Participants: 6  Group Focus: abuse issues, acceptance, activities of daily living skills, chemical dependency issues, coping skills, daily focus, depression, discharge education, healthy friendships, personal responsibility, relapse prevention, relaxation, self-esteem, social skills, and substance abuse education Treatment Modality:  Leisure Development Interventions utilized were leisure development and patient education Purpose: express feelings, increase insight, and relapse prevention strategies  Name: Trevor [name] Date of Birth: [birth year]  MR: [id number]    Level of Participation: active Quality of Participation: attentive and cooperative Interactions with others: gave feedback Mood/Affect: appropriate and brightens with interaction Triggers (if applicable): NA Cognition: coherent/clear Progress: Gaining insight Response: NA Plan: patient will be encouraged to keep going to groups.   Patients Problems:  Patient Active Problem List   Diagnosis Date Noted   Moderate episode of recurrent major depressive disorder (HCC) 01/30/2024   GAD (generalized anxiety disorder) 01/30/2024   Transaminitis 06/18/2022   GERD (gastroesophageal reflux disease) 06/18/2022   Hypokalemia 06/18/2022   Chronic hepatitis C without hepatic coma (HCC) 06/28/2019   Psoriasis 06/07/2019   H/O withdrawal symptoms, alcohol, with delirium and seizures (HCC) 06/07/2019   Alcohol use disorder, severe, dependence (HCC) 06/03/2019   Obesity (BMI 30-39.9) 01/09/2016   HSV-2 (herpes simplex virus 2) infection

## 2024-05-18 NOTE — Discharge Planning (Signed)
 SW spoke with patient to update on progress with referrals and made him aware that [facility] and [facility] have reviewed his information and should hear back soon. Patient stated that he was having some uncertainty about going to residential at this time due to inability to work and wanted to pursue IOP again. Patient stated his did IOP here upstairs with Darice Simpler in the past.  SW will try and schedule this appointment for him. MD made aware and patient is anticipated to be ready for DC Friday or Saturday. Will continue to follow.

## 2024-05-18 NOTE — ED Provider Notes (Signed)
 Behavioral Health Progress Note   Date: 05/18/24 Patient Name: Trevor [name] MRN: [id number] Chief Complaint: Substance abuse detox.  Diagnoses:  Final diagnoses:  Cocaine use disorder (HCC)  Alcohol dependence with unspecified alcohol-induced disorder (HCC)  History of alcohol withdrawal delirium  History of seizure due to alcohol withdrawal  Herpes simplex type 2 infection  Chronic hepatitis C without hepatic coma (HCC)   HPI: Trevor [name] is a 43 y.o. male with a history of alcohol use disorder who presented to the [facility] on 7/18 seeking detoxification from alcohol.   As per initial assessment:  Patient reports he consumes up to 24, mini bottles, of liquor daily with last use yesterday when patient stated he drank, about 20 mini bottles, of liquor. Patient also reports doing 1 to 2 grams of cocaine 3 to 4 times a week with last use in the last 48 hours when patient reported he, did a gram. Patient states he last was in residential treatment 4 months ago at [facility] where he participated in a 28 day program and maintained his sobriety about a month before relapsing. Rosalita, Sherrell BRAVO, NP, 05/13/2024).   Patient assessment note, 05/18/2024: Symptoms of withdrawal are less intense as compared to yesterday. Patient is reporting that anxiety is still staying at between 6-7, asking for the dose of this Hydroxyzine  to be increased from 25 to 50 mg TID PRN. As per objective and subjective assessments, mood is much better as compared to yesterday. He is able to sit in the day room for activities today, but was not able to do so yesterday. He denies issues with pain today, reports that he is moving his bowels well, no constipation, last BM was yesterday, appetite is fair. Attention to personal hygiene and grooming has slightly improved since yesterday, he reports that the nausea, vomiting and generalized malaise have resolved since a new taper was started  yesterday.   Patient has a history of alcohol withdrawal seizures, and we are continuing to exhibit caution with his care, as his taper and discharge dates are being prolonged to ensure that there is no risk of alcohol related seizure activities. Patient has a history of a seizure activity a day after his taper, and for this encounter, his Ativan  taper was completed on 7/21, and on 7/22, his symptoms of withdrawal worsened and he began having tingling to b/l arms as well as crawling sensations, requiring for Librium  to be reinitiated to mitigate risk of seizures. He will complete the taper on Friday, 7/27 and is in the process of working with CSW to get into a rehab.   Pt remains pleasant & polite, but is also presenting with a very depressed mood. Thought contents are organized and logical, and pt continues to deny SI/HI/AVH or paranoia. There is no evidence of delusional thoughts. He is continuing to verbalize his motivation to regain his sobriety and maintain it by completing this detox and going to rehab thereafter in order to improve upon his quality of life.    PHQ 2-9:  Flowsheet Row ED from 05/13/2024 in [facility] ED from 01/23/2024 in [facility] ED from 01/22/2024 in [facility]  Thoughts that you would be better off dead, or of hurting yourself in some way Several days Not at all Several days  PHQ-9 Total Score 9 14 12     Flowsheet Row ED from 05/13/2024 in [facility] Most recent reading  at 05/13/2024  4:39 PM ED from 05/13/2024 in [facility] Most recent reading at 05/13/2024  3:20 PM ED from 01/23/2024 in [facility] Most recent reading at 01/23/2024 11:16 AM  C-SSRS RISK CATEGORY No Risk No Risk Low Risk    Screenings    Flowsheet Row Most Recent Value  CIWA-Ar Total 0    Total Time spent with patient: 45  minutes  Musculoskeletal  Strength & Muscle Tone: within normal limits Gait & Station: normal Patient leans: N/A  Psychiatric Specialty Exam  Presentation General Appearance:  Fairly Groomed  Eye Contact: Fair  Speech: Clear and Coherent  Speech Volume: Normal  Handedness: Right   Mood and Affect  Mood: Depressed; Anxious  Affect: Congruent   Thought Process  Thought Processes: Coherent  Descriptions of Associations:Intact  Orientation:Full (Time, Place and Person)  Thought Content:Logical  Diagnosis of Schizophrenia or Schizoaffective disorder in past: No   Hallucinations:Hallucinations: None  Ideas of Reference:None  Suicidal Thoughts:Suicidal Thoughts: No  Homicidal Thoughts:Homicidal Thoughts: No   Sensorium  Memory: Immediate Fair  Judgment: Good  Insight: Fair   Art therapist  Concentration: Fair  Attention Span: Fair  Recall: Fair  Fund of Knowledge: Fair  Language: Fair   Psychomotor Activity  Psychomotor Activity: Psychomotor Activity: Normal    Assets  Assets: Resilience   Sleep  Sleep: Sleep: Fair   No data recorded   Physical Exam Vitals and nursing note reviewed.  HENT:     Head: Normocephalic.  Musculoskeletal:        General: Normal range of motion.    Review of Systems  Psychiatric/Behavioral:  Positive for depression and substance abuse. Negative for hallucinations, memory loss and suicidal ideas. The patient is nervous/anxious and has insomnia.   All other systems reviewed and are negative.   Blood pressure 116/62, pulse 76, temperature 97.8 F (36.6 C), temperature source Oral, resp. rate 16, SpO2 99%. There is no height or weight on file to calculate BMI.  Is the patient at risk to self? Yes -Denies SI, but alcoholism renders him at high risk of danger to self and others. Has the patient been a risk to self in the past 6 months? Yes .    Has the patient been a risk to self  within the distant past? No   Is the patient a risk to others? Yes   Has the patient been a risk to others in the past 6 months? Yes   Has the patient been a risk to others within the distant past? No   Past Medical History: See above  Family History: See above  Social History: See above   Last Labs:  Admission on 05/13/2024  Component Date Value Ref Range Status   Hgb A1c MFr Bld 05/15/2024 4.8  4.8 - 5.6 % Final   Comment: (NOTE) Diagnosis of Diabetes The following HbA1c ranges recommended by the American Diabetes Association (ADA) may be used as an aid in the diagnosis of diabetes mellitus.  Hemoglobin             Suggested A1C NGSP%              Diagnosis  <5.7                   Non Diabetic  5.7-6.4                Pre-Diabetic  >6.4  Diabetic  <7.0                   Glycemic control for                       adults with diabetes.     Mean Plasma Glucose 05/15/2024 91.06  mg/dL Final   Performed at [facility], [address] [place], [place] [address]   Cholesterol 05/15/2024 187  0 - 200 mg/dL Final   Triglycerides 92/79/7974 186 (H)  <150 mg/dL Final   HDL 92/79/7974 59  >40 mg/dL Final   Total CHOL/HDL Ratio 05/15/2024 3.2  RATIO Final   VLDL 05/15/2024 37  0 - 40 mg/dL Final   LDL Cholesterol 05/15/2024 91  0 - 99 mg/dL Final   Comment:        Total Cholesterol/HDL:CHD Risk Coronary Heart Disease Risk Table                     Men   Women  1/2 Average Risk   3.4   3.3  Average Risk       5.0   4.4  2 X Average Risk   9.6   7.1  3 X Average Risk  23.4   11.0        Use the calculated Patient Ratio above and the CHD Risk Table to determine the patient's CHD Risk.        ATP III CLASSIFICATION (LDL):  <100     mg/dL   Optimal  899-870  mg/dL   Near or Above                    Optimal  130-159  mg/dL   Borderline  839-810  mg/dL   High  >809     mg/dL   Very High Performed at [facility], [address],  [place], [place] [address]    Vit D, 25-Hydroxy 05/15/2024 78.38  30 - 100 ng/mL Final   Comment: (NOTE) Vitamin D  deficiency has been defined by the Institute of Medicine  and an Endocrine Society practice guideline as a level of serum 25-OH  vitamin D  less than 20 ng/mL (1,2). The Endocrine Society went on to  further define vitamin D  insufficiency as a level between 21 and 29  ng/mL (2).  1. IOM (Institute of Medicine). 2010. Dietary reference intakes for  calcium  and D. [place]: [employer]. 2. Holick MF, Binkley [place], Bischoff-Ferrari HA, et al. Evaluation,  treatment, and prevention of vitamin D  deficiency: an Endocrine  Society clinical practice guideline, JCEM. 2011 Jul; 96(7): 1911-30.  Performed at [facility], [address], [place], [place] [address]    Vitamin B-12 05/15/2024 265  180 - 914 pg/mL Final   Comment: (NOTE) This assay is not validated for testing neonatal or myeloproliferative syndrome specimens for Vitamin B12 levels. Performed at [facility], [address], [place], [place] [address]    Magnesium  05/15/2024 1.9  1.7 - 2.4 mg/dL Final   Performed at [facility], [address] [place], [place] [address]   TSH 05/15/2024 1.255  0.350 - 4.500 uIU/mL Final   Comment: Performed by a 3rd Generation assay with a functional sensitivity of <=0.01 uIU/mL. Performed at [facility], [address], [place], [place] [address]   Admission on 05/13/2024, Discharged on 05/13/2024  Component Date Value  Ref Range Status   WBC 05/13/2024 8.3  4.0 - 10.5 K/uL Final   RBC 05/13/2024 5.19  4.22 - 5.81 MIL/uL Final   Hemoglobin 05/13/2024 16.7  13.0 - 17.0 g/dL Final   HCT 92/81/7974 48.0  39.0 - 52.0 % Final   MCV 05/13/2024 92.5  80.0 - 100.0 fL Final   MCH 05/13/2024 32.2  26.0 - 34.0 pg Final   MCHC 05/13/2024 34.8  30.0 - 36.0 g/dL Final   RDW 92/81/7974 13.5  11.5 - 15.5 % Final   Platelets 05/13/2024 329  150 - 400  K/uL Final   nRBC 05/13/2024 0.0  0.0 - 0.2 % Final   Neutrophils Relative % 05/13/2024 80  % Final   Neutro Abs 05/13/2024 6.6  1.7 - 7.7 K/uL Final   Lymphocytes Relative 05/13/2024 14  % Final   Lymphs Abs 05/13/2024 1.2  0.7 - 4.0 K/uL Final   Monocytes Relative 05/13/2024 5  % Final   Monocytes Absolute 05/13/2024 0.4  0.1 - 1.0 K/uL Final   Eosinophils Relative 05/13/2024 0  % Final   Eosinophils Absolute 05/13/2024 0.0  0.0 - 0.5 K/uL Final   Basophils Relative 05/13/2024 0  % Final   Basophils Absolute 05/13/2024 0.0  0.0 - 0.1 K/uL Final   Immature Granulocytes 05/13/2024 1  % Final   Abs Immature Granulocytes 05/13/2024 0.04  0.00 - 0.07 K/uL Final   Performed at [facility], [address] [place], [place] [address]   Sodium 05/13/2024 138  135 - 145 mmol/L Final   Potassium 05/13/2024 3.9  3.5 - 5.1 mmol/L Final   Chloride 05/13/2024 97 (L)  98 - 111 mmol/L Final   CO2 05/13/2024 30  22 - 32 mmol/L Final   Glucose, Bld 05/13/2024 103 (H)  70 - 99 mg/dL Final   Glucose reference range applies only to samples taken after fasting for at least 8 hours.   BUN 05/13/2024 8  6 - 20 mg/dL Final   Creatinine, Ser 05/13/2024 0.82  0.61 - 1.24 mg/dL Final   Calcium  05/13/2024 9.0  8.9 - 10.3 mg/dL Final   Total Protein 92/81/7974 7.1  6.5 - 8.1 g/dL Final   Albumin 92/81/7974 4.1  3.5 - 5.0 g/dL Final   AST 92/81/7974 23  15 - 41 U/L Final   ALT 05/13/2024 21  0 - 44 U/L Final   Alkaline Phosphatase 05/13/2024 80  38 - 126 U/L Final   Total Bilirubin 05/13/2024 0.8  0.0 - 1.2 mg/dL Final   GFR, Estimated 05/13/2024 >60  >60 mL/min Final   Comment: (NOTE) Calculated using the CKD-EPI Creatinine Equation (2021)    Anion gap 05/13/2024 11  5 - 15 Final   Performed at [facility], [address], [place], [place] [address]   Magnesium  05/13/2024 1.7  1.7 - 2.4 mg/dL Final   Performed at [facility], [address], [place], [place] [address]   Alcohol, Ethyl  (B) 05/13/2024 52 (H)  <15 mg/dL Final   Comment: (NOTE) For medical purposes only. Performed at [facility], [address], [place], [place] [address]    POC Amphetamine UR 05/13/2024 None Detected  NONE DETECTED (Cut Off Level 1000 ng/mL) Final   POC Secobarbital (BAR) 05/13/2024 None Detected  NONE DETECTED (Cut Off Level 300 ng/mL) Final   POC Buprenorphine (BUP) 05/13/2024 None Detected  NONE DETECTED (Cut Off Level 10 ng/mL) Final   POC Oxazepam (BZO)  05/13/2024 None Detected  NONE DETECTED (Cut Off Level 300 ng/mL) Final   POC Cocaine UR 05/13/2024 None Detected  NONE DETECTED (Cut Off Level 300 ng/mL) Final   POC Methamphetamine UR 05/13/2024 None Detected  NONE DETECTED (Cut Off Level 1000 ng/mL) Final   POC Morphine  05/13/2024 None Detected  NONE DETECTED (Cut Off Level 300 ng/mL) Final   POC Methadone UR 05/13/2024 None Detected  NONE DETECTED (Cut Off Level 300 ng/mL) Final   POC Oxycodone  UR 05/13/2024 None Detected  NONE DETECTED (Cut Off Level 100 ng/mL) Final   POC Marijuana UR 05/13/2024 None Detected  NONE DETECTED (Cut Off Level 50 ng/mL) Final  Admission on 01/23/2024, Discharged on 02/02/2024  Component Date Value Ref Range Status   Sodium 01/30/2024 137  135 - 145 mmol/L Final   Potassium 01/30/2024 4.1  3.5 - 5.1 mmol/L Final   Chloride 01/30/2024 101  98 - 111 mmol/L Final   CO2 01/30/2024 30  22 - 32 mmol/L Final   Glucose, Bld 01/30/2024 92  70 - 99 mg/dL Final   Glucose reference range applies only to samples taken after fasting for at least 8 hours.   BUN 01/30/2024 17  6 - 20 mg/dL Final   Creatinine, Ser 01/30/2024 1.03  0.61 - 1.24 mg/dL Final   Calcium  01/30/2024 8.9  8.9 - 10.3 mg/dL Final   Total Protein 95/94/7974 7.2  6.5 - 8.1 g/dL Final   Albumin 95/94/7974 4.1  3.5 - 5.0 g/dL Final   AST 95/94/7974 18  15 - 41 U/L Final   ALT 01/30/2024 19  0 - 44 U/L Final   Alkaline Phosphatase 01/30/2024 43  38 - 126 U/L Final   Total Bilirubin  01/30/2024 0.6  0.0 - 1.2 mg/dL Final   GFR, Estimated 01/30/2024 >60  >60 mL/min Final   Comment: (NOTE) Calculated using the CKD-EPI Creatinine Equation (2021)    Anion gap 01/30/2024 6  5 - 15 Final   Performed at [facility], [address] [place], [place] [address]  Admission on 01/22/2024, Discharged on 01/23/2024  Component Date Value Ref Range Status   WBC 01/22/2024 6.8  4.0 - 10.5 K/uL Final   RBC 01/22/2024 5.22  4.22 - 5.81 MIL/uL Final   Hemoglobin 01/22/2024 17.2 (H)  13.0 - 17.0 g/dL Final   HCT 96/71/7974 47.8  39.0 - 52.0 % Final   MCV 01/22/2024 91.6  80.0 - 100.0 fL Final   MCH 01/22/2024 33.0  26.0 - 34.0 pg Final   MCHC 01/22/2024 36.0  30.0 - 36.0 g/dL Final   RDW 96/71/7974 12.1  11.5 - 15.5 % Final   Platelets 01/22/2024 319  150 - 400 K/uL Final   nRBC 01/22/2024 0.0  0.0 - 0.2 % Final   Neutrophils Relative % 01/22/2024 70  % Final   Neutro Abs 01/22/2024 4.8  1.7 - 7.7 K/uL Final   Lymphocytes Relative 01/22/2024 25  % Final   Lymphs Abs 01/22/2024 1.7  0.7 - 4.0 K/uL Final   Monocytes Relative 01/22/2024 4  % Final   Monocytes Absolute 01/22/2024 0.3  0.1 - 1.0 K/uL Final   Eosinophils Relative 01/22/2024 0  % Final   Eosinophils Absolute 01/22/2024 0.0  0.0 - 0.5 K/uL Final   Basophils Relative 01/22/2024 1  % Final   Basophils Absolute 01/22/2024 0.1  0.0 - 0.1 K/uL Final   Immature Granulocytes 01/22/2024 0  % Final   Abs Immature Granulocytes 01/22/2024 0.02  0.00 - 0.07 K/uL Final   Performed at [facility], [address] [place], [place] [address]   Sodium 01/22/2024 138  135 - 145 mmol/L Final   Potassium 01/22/2024 3.9  3.5 - 5.1 mmol/L Final   Chloride 01/22/2024 99  98 - 111 mmol/L Final   CO2 01/22/2024 25  22 - 32 mmol/L Final   Glucose, Bld 01/22/2024 99  70 - 99 mg/dL Final   Glucose reference range applies only to samples taken after fasting for at least 8 hours.   BUN 01/22/2024 13  6 - 20 mg/dL Final   Creatinine,  Ser 01/22/2024 0.83  0.61 - 1.24 mg/dL Final   Calcium  01/22/2024 8.9  8.9 - 10.3 mg/dL Final   Total Protein 96/71/7974 7.5  6.5 - 8.1 g/dL Final   Albumin 96/71/7974 4.6  3.5 - 5.0 g/dL Final   AST 96/71/7974 44 (H)  15 - 41 U/L Final   ALT 01/22/2024 46 (H)  0 - 44 U/L Final   Alkaline Phosphatase 01/22/2024 60  38 - 126 U/L Final   Total Bilirubin 01/22/2024 0.8  0.0 - 1.2 mg/dL Final   GFR, Estimated 01/22/2024 >60  >60 mL/min Final   Comment: (NOTE) Calculated using the CKD-EPI Creatinine Equation (2021)    Anion gap 01/22/2024 14  5 - 15 Final   Performed at [facility], [address], [place], [place] [address]   Hgb A1c MFr Bld 01/22/2024 5.0  4.8 - 5.6 % Final   Comment: (NOTE) Pre diabetes:          5.7%-6.4%  Diabetes:              >6.4%  Glycemic control for   <7.0% adults with diabetes    Mean Plasma Glucose 01/22/2024 96.8  mg/dL Final   Performed at [facility], [address], [place], [place] [address]   Magnesium  01/22/2024 1.9  1.7 - 2.4 mg/dL Final   Performed at [facility], [address] [place], [place] [address]   Alcohol, Ethyl (B) 01/22/2024 109 (H)  <10 mg/dL Final   Comment: (NOTE) Lowest detectable limit for serum alcohol is 10 mg/dL.  For medical purposes only. Performed at [facility], [address] [place], [place] [address]    Cholesterol 01/22/2024 202 (H)  0 - 200 mg/dL Final   Triglycerides 96/71/7974 65  <150 mg/dL Final   HDL 96/71/7974 84  >40 mg/dL Final   Total CHOL/HDL Ratio 01/22/2024 2.4  RATIO Final   VLDL 01/22/2024 13  0 - 40 mg/dL Final   LDL Cholesterol 01/22/2024 105 (H)  0 - 99 mg/dL Final   Comment:        Total Cholesterol/HDL:CHD Risk Coronary Heart Disease Risk Table                     Men   Women  1/2 Average Risk   3.4   3.3  Average Risk       5.0   4.4  2 X Average Risk   9.6   7.1  3 X Average Risk  23.4   11.0        Use the calculated Patient Ratio above and the CHD Risk  Table to determine the patient's CHD Risk.        ATP III CLASSIFICATION (LDL):  <100     mg/dL   Optimal  899-870  mg/dL  Near or Above                    Optimal  130-159  mg/dL   Borderline  839-810  mg/dL   High  >809     mg/dL   Very High Performed at [facility], [address] [place], [place] [address]    TSH 01/22/2024 0.458  0.350 - 4.500 uIU/mL Final   Comment: Performed by a 3rd Generation assay with a functional sensitivity of <=0.01 uIU/mL. Performed at [facility], [address] [place], [place] [address]    RPR Ser Ql 01/22/2024 NON REACTIVE  NON REACTIVE Final   Performed at [facility], [address], [place], [place] [address]   Color, Urine 01/22/2024 YELLOW  YELLOW Final   APPearance 01/22/2024 CLEAR  CLEAR Final   Specific Gravity, Urine 01/22/2024 1.020  1.005 - 1.030 Final   pH 01/22/2024 5.0  5.0 - 8.0 Final   Glucose, UA 01/22/2024 NEGATIVE  NEGATIVE mg/dL Final   Hgb urine dipstick 01/22/2024 NEGATIVE  NEGATIVE Final   Bilirubin Urine 01/22/2024 NEGATIVE  NEGATIVE Final   Ketones, ur 01/22/2024 5 (A)  NEGATIVE mg/dL Final   Protein, ur 96/71/7974 NEGATIVE  NEGATIVE mg/dL Final   Nitrite 96/71/7974 NEGATIVE  NEGATIVE Final   Leukocytes,Ua 01/22/2024 NEGATIVE  NEGATIVE Final   Performed at [facility], [address], [place], [place] [address]   POC Amphetamine UR 01/22/2024 None Detected   Final   POC Secobarbital (BAR) 01/22/2024 None Detected   Final   POC Buprenorphine (BUP) 01/22/2024 None Detected   Final   POC Oxazepam (BZO) 01/22/2024 None Detected   Final   POC Cocaine UR 01/22/2024 None Detected   Final   POC Methamphetamine UR 01/22/2024 None Detected   Final   POC Morphine  01/22/2024 None Detected   Final   POC Methadone UR 01/22/2024 None Detected   Final   POC Oxycodone  UR 01/22/2024 None Detected   Final   POC Marijuana UR 01/22/2024 None Detected   Final  Admission on 12/24/2023, Discharged on  12/25/2023  Component Date Value Ref Range Status   Sodium 12/24/2023 144  135 - 145 mmol/L Final   Potassium 12/24/2023 3.8  3.5 - 5.1 mmol/L Final   Chloride 12/24/2023 107  98 - 111 mmol/L Final   CO2 12/24/2023 26  22 - 32 mmol/L Final   Glucose, Bld 12/24/2023 96  70 - 99 mg/dL Final   Glucose reference range applies only to samples taken after fasting for at least 8 hours.   BUN 12/24/2023 11  6 - 20 mg/dL Final   Creatinine, Ser 12/24/2023 0.62  0.61 - 1.24 mg/dL Final   Calcium  12/24/2023 8.6 (L)  8.9 - 10.3 mg/dL Final   Total Protein 97/72/7974 7.8  6.5 - 8.1 g/dL Final   Albumin 97/72/7974 4.3  3.5 - 5.0 g/dL Final   AST 97/72/7974 34  15 - 41 U/L Final   ALT 12/24/2023 27  0 - 44 U/L Final   Alkaline Phosphatase 12/24/2023 56  38 - 126 U/L Final   Total Bilirubin 12/24/2023 0.4  0.0 - 1.2 mg/dL Final   GFR, Estimated 12/24/2023 >60  >60 mL/min Final   Comment: (NOTE) Calculated using the CKD-EPI Creatinine Equation (2021)    Anion gap 12/24/2023 11  5 - 15 Final   Performed at [facility], [address] [place], [place] [address]  Alcohol, Ethyl (B) 12/24/2023 436 (HH)  <10 mg/dL Final   Comment: CRITICAL RESULT CALLED TO, READ BACK BY AND VERIFIED WITH CANDIE MOLT, RN 12/25/23 0050 BY K. DAVIS (NOTE) Lowest detectable limit for serum alcohol is 10 mg/dL.  For medical purposes only. Performed at [facility], [address] [place], [place] [address]    Opiates 12/25/2023 NONE DETECTED  NONE DETECTED Final   Cocaine 12/25/2023 NONE DETECTED  NONE DETECTED Final   Benzodiazepines 12/25/2023 NONE DETECTED  NONE DETECTED Final   Amphetamines 12/25/2023 NONE DETECTED  NONE DETECTED Final   Tetrahydrocannabinol 12/25/2023 NONE DETECTED  NONE DETECTED Final   Barbiturates 12/25/2023 NONE DETECTED  NONE DETECTED Final   Comment: (NOTE) DRUG SCREEN FOR MEDICAL PURPOSES ONLY.  IF CONFIRMATION IS NEEDED FOR ANY PURPOSE, NOTIFY  LAB WITHIN 5 DAYS.  LOWEST DETECTABLE LIMITS FOR URINE DRUG SCREEN Drug Class                     Cutoff (ng/mL) Amphetamine and metabolites    1000 Barbiturate and metabolites    200 Benzodiazepine                 200 Opiates and metabolites        300 Cocaine and metabolites        300 THC                            50 Performed at [facility], [address] [place], [place] [address]    WBC 12/24/2023 5.1  4.0 - 10.5 K/uL Final   RBC 12/24/2023 4.78  4.22 - 5.81 MIL/uL Final   Hemoglobin 12/24/2023 16.0  13.0 - 17.0 g/dL Final   HCT 97/72/7974 45.7  39.0 - 52.0 % Final   MCV 12/24/2023 95.6  80.0 - 100.0 fL Final   MCH 12/24/2023 33.5  26.0 - 34.0 pg Final   MCHC 12/24/2023 35.0  30.0 - 36.0 g/dL Final   RDW 97/72/7974 12.8  11.5 - 15.5 % Final   Platelets 12/24/2023 292  150 - 400 K/uL Final   nRBC 12/24/2023 0.0  0.0 - 0.2 % Final   Neutrophils Relative % 12/24/2023 61  % Final   Neutro Abs 12/24/2023 3.0  1.7 - 7.7 K/uL Final   Lymphocytes Relative 12/24/2023 31  % Final   Lymphs Abs 12/24/2023 1.6  0.7 - 4.0 K/uL Final   Monocytes Relative 12/24/2023 8  % Final   Monocytes Absolute 12/24/2023 0.4  0.1 - 1.0 K/uL Final   Eosinophils Relative 12/24/2023 0  % Final   Eosinophils Absolute 12/24/2023 0.0  0.0 - 0.5 K/uL Final   Basophils Relative 12/24/2023 0  % Final   Basophils Absolute 12/24/2023 0.0  0.0 - 0.1 K/uL Final   Immature Granulocytes 12/24/2023 0  % Final   Abs Immature Granulocytes 12/24/2023 0.02  0.00 - 0.07 K/uL Final   Performed at [facility], [address], [place], [place] [address]   Lipase 12/24/2023 41  11 - 51 U/L Final   Performed at [facility], [address] [place], [place] [address]   Color, Urine 12/25/2023 YELLOW  YELLOW Final   APPearance 12/25/2023 HAZY (A)  CLEAR Final   Specific Gravity, Urine 12/25/2023 1.010  1.005 - 1.030 Final   pH 12/25/2023 7.0  5.0 - 8.0 Final    Glucose, UA 12/25/2023 NEGATIVE  NEGATIVE mg/dL Final   Hgb urine dipstick 12/25/2023 NEGATIVE  NEGATIVE Final   Bilirubin Urine 12/25/2023 NEGATIVE  NEGATIVE Final   Ketones, ur 12/25/2023 NEGATIVE  NEGATIVE mg/dL Final   Protein, ur 97/71/7974 NEGATIVE  NEGATIVE mg/dL Final   Nitrite 97/71/7974 NEGATIVE  NEGATIVE Final   Leukocytes,Ua 12/25/2023 NEGATIVE  NEGATIVE Final   Performed at [facility], [address], [place], [place] [address]    Allergies: Patient has no known allergies.  Medications:  Facility Ordered Medications  Medication   [COMPLETED] thiamine  (VITAMIN B1) injection 100 mg   [EXPIRED] hydrOXYzine  (ATARAX ) tablet 25 mg   [EXPIRED] loperamide  (IMODIUM ) capsule 2-4 mg   [EXPIRED] LORazepam  (ATIVAN ) tablet 1 mg   [COMPLETED] LORazepam  (ATIVAN ) tablet 1 mg   Followed by   [COMPLETED] LORazepam  (ATIVAN ) tablet 1 mg   Followed by   [EXPIRED] LORazepam  (ATIVAN ) tablet 1 mg   Followed by   [COMPLETED] LORazepam  (ATIVAN ) tablet 1 mg   [EXPIRED] ondansetron  (ZOFRAN -ODT) disintegrating tablet 4 mg   thiamine  (VITAMIN B1) tablet 100 mg   acetaminophen  (TYLENOL ) tablet 650 mg   alum & mag hydroxide-simeth (MAALOX/MYLANTA) 200-200-20 MG/5ML suspension 30 mL   magnesium  hydroxide (MILK OF MAGNESIA) suspension 30 mL   haloperidol  (HALDOL ) tablet 5 mg   And   diphenhydrAMINE  (BENADRYL ) capsule 50 mg   haloperidol  lactate (HALDOL ) injection 5 mg   And   diphenhydrAMINE  (BENADRYL ) injection 50 mg   And   LORazepam  (ATIVAN ) injection 2 mg   haloperidol  lactate (HALDOL ) injection 10 mg   And   diphenhydrAMINE  (BENADRYL ) injection 50 mg   And   LORazepam  (ATIVAN ) injection 2 mg   sertraline  (ZOLOFT ) tablet 100 mg   traZODone  (DESYREL ) tablet 100 mg   multivitamin with minerals tablet 1 tablet   chlordiazePOXIDE  (LIBRIUM ) capsule 25 mg   hydrOXYzine  (ATARAX ) tablet 25 mg   ondansetron  (ZOFRAN -ODT) disintegrating tablet 4 mg   [COMPLETED]  chlordiazePOXIDE  (LIBRIUM ) capsule 25 mg   Followed by   chlordiazePOXIDE  (LIBRIUM ) capsule 25 mg   Followed by   NOREEN ON 05/19/2024] chlordiazePOXIDE  (LIBRIUM ) capsule 25 mg   Followed by   NOREEN ON 05/20/2024] chlordiazePOXIDE  (LIBRIUM ) capsule 25 mg   loperamide  (IMODIUM ) capsule 2-4 mg   PTA Medications  Medication Sig   sertraline  (ZOLOFT ) 50 MG tablet Take 1 tablet (50 mg total) by mouth every morning.   valACYclovir  (VALTREX ) 500 MG tablet Take 500 mg by mouth daily as needed (For outbreaks).   hydrOXYzine  (ATARAX ) 50 MG tablet Take 50 mg by mouth daily as needed for anxiety.   Long Term Goals: Improvement in symptoms so as ready for discharge  Short Term Goals: Patient will verbalize feelings in meetings with treatment team members., Patient will attend at least of 50% of the groups daily., Pt will complete the PHQ9 on admission, day 3 and discharge., Patient will participate in completing the [place] Suicide Severity Rating Scale, Patient will score a low risk of violence for 24 hours prior to discharge, and Patient will take medications as prescribed daily.  Medical Decision Making  Treatment Plan Summary: Daily contact with patient to assess and evaluate symptoms and progress in treatment and Medication management  Safety and Monitoring: Voluntary admission to inpatient psychiatric unit for safety, stabilization and treatment Daily contact with patient to assess and evaluate symptoms and progress in treatment Patient's case to be discussed in multi-disciplinary team meeting Observation Level : q15 minute checks Vital signs: q12 hours Precautions: Safety Monitor for  signs of withdrawal Abstinence from substances encouraged  SW to look into options for outpatient SA treatment at discharge   Diagnoses Principal Problem:   Alcohol use disorder, severe, dependence (HCC)  Medications - Ativan  taper completed at on 04/2109 AM. -Continue Librium  taper-please see MAR for  details -Oral thiamine  and MVI replacement as per the [facility] -See Other medication adjustments in the body of note above.   PRNS -Continue Tylenol  650 mg every 6 hours PRN for mild pain -Continue Maalox 30 mg every 4 hrs PRN for indigestion -Continue Milk of Magnesia as needed every 6 hrs for constipation -Agitation protocol as per the [facility] -Hydroxyzine  25 mg every 6 hours as needed -Continue Trazodone  50 mg nightly prn for sleep  Recommendations  Based on my evaluation the patient continues to meed the inpatient behavioral health unit criteria ([facility]) for treatment and stabilization, prior to sending to rehab for alcohol use disorder.  Discharge Planning: Social work and case management to assist with discharge planning and identification of hospital follow-up needs prior to discharge Estimated LOS: 5-7 days Discharge Concerns: Need to establish a safety plan; Medication compliance and effectiveness Discharge Goals: Return home with outpatient referrals for mental health follow-up including medication management/psychotherapy  Labs Reviewed: Ordered Ha1c, Vits B12, D, TSH, Lipi d panel, Mg. EKG WNL.   Total Time Spent in Direct Patient Care:  I personally spent 45 minutes on the unit in direct patient care. The direct patient care time included face-to-face time with the patient, reviewing the patient's chart, communicating with other professionals, and coordinating care. Greater than 50% of this time was spent in counseling or coordinating care with the patient regarding goals of hospitalization, psycho-education, and discharge planning needs.    I certify that inpatient services furnished can reasonably be expected to improve the patient's condition.    Donia Snell, NP 05/18/24  7:03 PM

## 2024-05-18 NOTE — ED Notes (Signed)
 Patient A&Ox4. Denies intent to harm self/others when asked. Denies A/VH. Patient denies any physical complaints when asked. No acute distress noted. Support and encouragement provided. Routine safety checks conducted according to facility protocol. Encouraged patient to notify staff if thoughts of harm toward self or others arise. Patient verbalize understanding and agreement. Will continue to monitor for safety.

## 2024-05-18 NOTE — Group Note (Signed)
 Group Topic: Recovery Basics  Group Date: 05/18/2024 Start Time: 1000 End Time: 1100 Facilitators: Nicholaus Arlyne BIRCH, NT  Department: [facility]  Number of Participants: 5  Group Focus: substance abuse education Treatment Modality:  Spiritual Interventions utilized were clarification Purpose: increase insight  Name: Trevor [name] Date of Birth: 1980-12-22  MR: [id number]    Level of Participation: active Quality of Participation: cooperative Interactions with others: congruent Mood/Affect: appropriate Triggers (if applicable): none Cognition: coherent/clear Progress: Gaining insight Response: positive Plan: patient will be encouraged to continue recovery process  Patients Problems:  Patient Active Problem List   Diagnosis Date Noted   Moderate episode of recurrent major depressive disorder (HCC) 01/30/2024   GAD (generalized anxiety disorder) 01/30/2024   Transaminitis 06/18/2022   GERD (gastroesophageal reflux disease) 06/18/2022   Hypokalemia 06/18/2022   Chronic hepatitis C without hepatic coma (HCC) 06/28/2019   Psoriasis 06/07/2019   H/O withdrawal symptoms, alcohol, with delirium and seizures (HCC) 06/07/2019   Alcohol use disorder, severe, dependence (HCC) 06/03/2019   Obesity (BMI 30-39.9) 01/09/2016   HSV-2 (herpes simplex virus 2) infection

## 2024-05-18 NOTE — Discharge Planning (Signed)
 SW made call to [facility] and spoke with Rosaline and she is to review the clinicals and will return call to SW in a few hours. SW completed assessment for [facility].  SW sent additional clinicals to [place] and [facility].  Will continue to follow.

## 2024-05-18 NOTE — Group Note (Signed)
 Group Topic: Communication  Group Date: 05/18/2024 Start Time: 0900 End Time: 1000 Facilitators: Herold Lajuana NOVAK, RN  Department: [facility]  Number of Participants: 8  Group Focus: communication Treatment Modality:  Individual Therapy Interventions utilized were patient education Purpose: increase insight  Name: Trevor [name] Date of Birth: 1981-09-25  MR: [id number]      Patients Problems: Level of Participation: active Quality of Participation: cooperative Interactions with others: gave feedback Mood/Affect: appropriate Triggers (if applicable): none identified Cognition: coherent/clear and goal directed Progress: Significant Response: Pt verbalized understanding of all medications administered Plan: patient will be encouraged to remain med compliant throughout tx and to notify staff with any questions or concerns Patient Active Problem List   Diagnosis Date Noted   Moderate episode of recurrent major depressive disorder (HCC) 01/30/2024   GAD (generalized anxiety disorder) 01/30/2024   Transaminitis 06/18/2022   GERD (gastroesophageal reflux disease) 06/18/2022   Hypokalemia 06/18/2022   Chronic hepatitis C without hepatic coma (HCC) 06/28/2019   Psoriasis 06/07/2019   H/O withdrawal symptoms, alcohol, with delirium and seizures (HCC) 06/07/2019   Alcohol use disorder, severe, dependence (HCC) 06/03/2019   Obesity (BMI 30-39.9) 01/09/2016   HSV-2 (herpes simplex virus 2) infection

## 2024-05-18 NOTE — ED Notes (Signed)
Pt is sleeping, no acute distress noted.

## 2024-05-19 DIAGNOSIS — F1020 Alcohol dependence, uncomplicated: Secondary | ICD-10-CM | POA: Diagnosis not present

## 2024-05-19 DIAGNOSIS — B182 Chronic viral hepatitis C: Secondary | ICD-10-CM | POA: Diagnosis not present

## 2024-05-19 DIAGNOSIS — A6002 Herpesviral infection of other male genital organs: Secondary | ICD-10-CM | POA: Diagnosis not present

## 2024-05-19 DIAGNOSIS — F1410 Cocaine abuse, uncomplicated: Secondary | ICD-10-CM | POA: Diagnosis not present

## 2024-05-19 NOTE — Group Note (Signed)
 Group Topic: Wellness  Group Date: 05/19/2024 Start Time: 1800 End Time: 1830 Facilitators: Daved Tinnie HERO, RN  Department: [facility]  Number of Participants: 10  Group Focus: nursing group Treatment Modality:  Psychoeducation Interventions utilized were patient education Purpose: reinforce self-care  Name: Trevor [name] Date of Birth: 1981/02/02  MR: [id number]    Level of Participation: moderate Quality of Participation: cooperative Interactions with others: gave feedback Mood/Affect: appropriate Triggers (if applicable): n/a Cognition: coherent/clear Progress: Gaining insight Response: RN reviewed medications with pt, questions denied Plan: patient will be encouraged to attend future RN education groups  Patients Problems:  Patient Active Problem List   Diagnosis Date Noted   Moderate episode of recurrent major depressive disorder (HCC) 01/30/2024   GAD (generalized anxiety disorder) 01/30/2024   Transaminitis 06/18/2022   GERD (gastroesophageal reflux disease) 06/18/2022   Hypokalemia 06/18/2022   Chronic hepatitis C without hepatic coma (HCC) 06/28/2019   Psoriasis 06/07/2019   H/O withdrawal symptoms, alcohol, with delirium and seizures (HCC) 06/07/2019   Alcohol use disorder, severe, dependence (HCC) 06/03/2019   Obesity (BMI 30-39.9) 01/09/2016   HSV-2 (herpes simplex virus 2) infection

## 2024-05-19 NOTE — ED Notes (Signed)
 Pt currently attending morning group, pt affirms to have eaten breakfast. Pt denies si hi avh- verbal contract for safety provided. Pt denies pain and physical discomforts. Pt says that anxiety is 'not too bad' and that it is getting better.

## 2024-05-19 NOTE — Group Note (Signed)
 Group Topic: Overcoming Obstacles  Group Date: 05/19/2024 Start Time: 0800 End Time: 0900 Facilitators: Lonzell Dwayne RAMAN, NT  Department: [facility]  Number of Participants: 7  Group Focus: goals/reality orientation Treatment Modality:  Patient-Centered Therapy Interventions utilized were mental fitness Purpose: enhance coping skills  Name: Trevor [name] Date of Birth: 09-Oct-1981  MR: [id number]    Level of Participation: Patient Attended groupmoderate Quality of Participation: attentive Interactions with others: gave feedback Mood/Affect: positive Triggers (if applicable): N/A Cognition: coherent/clear Progress: Moderate Response: Appropriate  Plan: patient will be encouraged to continue to be positive and go on to other facilities to continue to grow.  Patients Problems:  Patient Active Problem List   Diagnosis Date Noted   Moderate episode of recurrent major depressive disorder (HCC) 01/30/2024   GAD (generalized anxiety disorder) 01/30/2024   Transaminitis 06/18/2022   GERD (gastroesophageal reflux disease) 06/18/2022   Hypokalemia 06/18/2022   Chronic hepatitis C without hepatic coma (HCC) 06/28/2019   Psoriasis 06/07/2019   H/O withdrawal symptoms, alcohol, with delirium and seizures (HCC) 06/07/2019   Alcohol use disorder, severe, dependence (HCC) 06/03/2019   Obesity (BMI 30-39.9) 01/09/2016   HSV-2 (herpes simplex virus 2) infection

## 2024-05-19 NOTE — Group Note (Signed)
 Group Topic: Coping Skills  Group Date: 05/19/2024 Start Time: 1500 End Time: 1600 Facilitators: [place] Stephane HERO, [place]  Department: [facility]  Number of Participants: 9  Group Focus: anger management, anxiety, clarity of thought, coping skills, and feeling awareness/expression Treatment Modality:  Psychoeducation Interventions utilized were clarification, exploration, patient education, and problem solving Purpose: enhance coping skills, explore maladaptive thinking, express feelings, increase insight, reinforce self-care, relapse prevention strategies, and trigger / craving management  Name: Trevor [name] Date of Birth: Feb 07, 1981  MR: [id number]    Level of Participation: active Quality of Participation: attentive, cooperative, initiates communication, motivated, and offered feedback Interactions with others: gave feedback Mood/Affect: brightens with interaction and positive Triggers (if applicable): n/a Cognition: coherent/clear and insightful Progress: Significant Response: Gave very insightful feedback Plan: referral / recommendations  Patients Problems:  Patient Active Problem List   Diagnosis Date Noted   Moderate episode of recurrent major depressive disorder (HCC) 01/30/2024   GAD (generalized anxiety disorder) 01/30/2024   Transaminitis 06/18/2022   GERD (gastroesophageal reflux disease) 06/18/2022   Hypokalemia 06/18/2022   Chronic hepatitis C without hepatic coma (HCC) 06/28/2019   Psoriasis 06/07/2019   H/O withdrawal symptoms, alcohol, with delirium and seizures (HCC) 06/07/2019   Alcohol use disorder, severe, dependence (HCC) 06/03/2019   Obesity (BMI 30-39.9) 01/09/2016   HSV-2 (herpes simplex virus 2) infection

## 2024-05-19 NOTE — ED Notes (Signed)
 Pt

## 2024-05-19 NOTE — ED Notes (Signed)
 Pt is observed watching television in the dayroom and attended group. Pt denies SI/HI/AVH. Pt denied withdrawal symptoms; he appears as anxious. He c/o trouble sleeping last night. PRN Trazodone  given as per orders. Pt is safe on the unit at this time with Q 15 min safety checks in place. Pt stated that he is seeking out patient treatment when he leaves.

## 2024-05-19 NOTE — Progress Notes (Signed)
 Patient resting quietly in bed with eyes closed with unlabored breathing. Q 15 minute safety checks remain in place.  Pt remains safe on the unit at this time.

## 2024-05-19 NOTE — ED Notes (Signed)
 Patient is sleeping. Respirations equal and unlabored. No change in assessment or acuity. Routine safety checks conducted according to facility protocol.

## 2024-05-19 NOTE — ED Notes (Signed)
 Pt is in the dayroom watching TV with peers. Pt denies SI/HI/AVH. Pt has no further complain.No acute distress noted.

## 2024-05-19 NOTE — ED Provider Notes (Signed)
 Behavioral Health Progress Note  Date and Time: 05/19/2024 1:05 PM Name: Trevor [name] MRN:  [id number]  Subjective:  Trevor [name] is a 43 year old male with a history of alcohol use disorder who presented to the [facility] voluntary on 05/13/2024 seeking alcohol detox. He reported drinking a 24 mini bottles of liquor and 1 to 2 g of cocaine, 3-4 times per week. BAL was 52 on arrival. Urine drug screen was negative.  Today, he reports feeling much better. He states that he feels like he is over the hump of withdrawing and is no longer feeling shaky.  He denies withdrawal symptoms at this time. He denies depressive or anxiety symptoms. He states that his appetite has improved over the course of a few days. He reports a little trouble falling asleep but states once he is asleep he sleeps well. He denies auditory or visual hallucinations. He denies suicidal thoughts. He denies homicidal thoughts. He denies medication side effects. He denies physical complaints. He verbalizes readiness to discharge. I discussed with the patient that his Librium  taper will in tomorrow night and will plan to discharge him on Saturday, 05/21/2024. He plans to follow up with intensive outpatient service for substance abuse  treatment. He resides with his brother who he identifies as supportive and states that his parents also live here in [place] and are  supportive.  Per chart review, patient has a history of alcohol withdrawal seizures, and we are continuing to exhibit caution with his care, as his taper and discharge dates are being prolonged to ensure that there is no risk of alcohol related seizure activities. Patient has a history of a seizure activity a day after his taper, and for this encounter, his Ativan  taper was completed on 7/21, and on 7/22, his symptoms of withdrawal worsened and he began having tingling to b/l arms as well as crawling sensations, requiring for Librium   to be reinitiated to mitigate risk of seizures. He will complete the taper on Friday,7/25.  Diagnosis:  Final diagnoses:  Cocaine use disorder (HCC)  Alcohol dependence with unspecified alcohol-induced disorder (HCC)  History of alcohol withdrawal delirium  History of seizure due to alcohol withdrawal  Herpes simplex type 2 infection  Chronic hepatitis C without hepatic coma (HCC)    Total Time spent with patient: 30 minutes  Past Psychiatric History: History of alcohol use disorder, MDD and GAD. Four past rehabilitation substance abuse treatment.  Previously prescribed Wellbutrin , Lexapro , trazodone  and Vivitrol .  Past Medical History: Prolonged QTc, hepatitis, hep C, HSV, and alcohol withdrawal seizures and delirium tremens Family History: Mother hx skin cancer, paternal grandfather hx colon cancer and paternal grandmother and grandfather hx lung cancer. Family Psychiatric  History: brother hx of alcohol abuse, and maternal grandmother and grandfather alcohol abuse Social History: Divorced, has 1 daughter, unemployed/recently fired and resides with brother.   Current Medications:  Current Facility-Administered Medications  Medication Dose Route Frequency Provider Last Rate Last Admin   acetaminophen  (TYLENOL ) tablet 650 mg  650 mg Oral Q6H PRN Hobson, Fran E, NP       alum & mag hydroxide-simeth (MAALOX/MYLANTA) 200-200-20 MG/5ML suspension 30 mL  30 mL Oral Q4H PRN Hobson, Fran E, NP       chlordiazePOXIDE  (LIBRIUM ) capsule 25 mg  25 mg Oral Q6H PRN Tex Drilling, NP       chlordiazePOXIDE  (LIBRIUM ) capsule 25 mg  25 mg Oral TID Nkwenti, Doris, NP   25 mg at 05/19/24 [phone]  Followed by   chlordiazePOXIDE  (LIBRIUM ) capsule 25 mg  25 mg Oral BH-qamhs Nkwenti, Doris, NP       Followed by   NOREEN ON 05/20/2024] chlordiazePOXIDE  (LIBRIUM ) capsule 25 mg  25 mg Oral Daily Nkwenti, Doris, NP       haloperidol  (HALDOL ) tablet 5 mg  5 mg Oral TID PRN Hobson, Fran E, NP       And    diphenhydrAMINE  (BENADRYL ) capsule 50 mg  50 mg Oral TID PRN Hobson, Fran E, NP       haloperidol  lactate (HALDOL ) injection 5 mg  5 mg Intramuscular TID PRN Hobson, Fran E, NP       And   diphenhydrAMINE  (BENADRYL ) injection 50 mg  50 mg Intramuscular TID PRN Hobson, Fran E, NP       And   LORazepam  (ATIVAN ) injection 2 mg  2 mg Intramuscular TID PRN Hobson, Fran E, NP       haloperidol  lactate (HALDOL ) injection 10 mg  10 mg Intramuscular TID PRN Hobson, Fran E, NP       And   diphenhydrAMINE  (BENADRYL ) injection 50 mg  50 mg Intramuscular TID PRN Hobson, Fran E, NP       And   LORazepam  (ATIVAN ) injection 2 mg  2 mg Intramuscular TID PRN Hobson, Fran E, NP       hydrOXYzine  (ATARAX ) tablet 25 mg  25 mg Oral Q6H PRN Tex Drilling, NP   25 mg at 05/18/24 1241   loperamide  (IMODIUM ) capsule 2-4 mg  2-4 mg Oral PRN Tex Drilling, NP       magnesium  hydroxide (MILK OF MAGNESIA) suspension 30 mL  30 mL Oral Daily PRN Hobson, Fran E, NP       multivitamin with minerals tablet 1 tablet  1 tablet Oral Daily Tex Drilling, NP   1 tablet at 05/19/24 9053   ondansetron  (ZOFRAN -ODT) disintegrating tablet 4 mg  4 mg Oral Q6H PRN Tex Drilling, NP       sertraline  (ZOLOFT ) tablet 100 mg  100 mg Oral QHS Nkwenti, Doris, NP   100 mg at 05/18/24 2124   thiamine  (VITAMIN B1) tablet 100 mg  100 mg Oral Daily Hobson, Fran E, NP   100 mg at 05/19/24 9053   traZODone  (DESYREL ) tablet 100 mg  100 mg Oral QHS PRN Tex Drilling, NP   100 mg at 05/18/24 2142   Current Outpatient Medications  Medication Sig Dispense Refill   hydrOXYzine  (ATARAX ) 50 MG tablet Take 50 mg by mouth daily as needed for anxiety.     sertraline  (ZOLOFT ) 50 MG tablet Take 1 tablet (50 mg total) by mouth every morning. 30 tablet 0   valACYclovir  (VALTREX ) 500 MG tablet Take 500 mg by mouth daily as needed (For outbreaks).      Labs  Lab Results:  Admission on 05/13/2024  Component Date Value Ref Range Status   Hgb A1c MFr Bld  05/15/2024 4.8  4.8 - 5.6 % Final   Comment: (NOTE) Diagnosis of Diabetes The following HbA1c ranges recommended by the American Diabetes Association (ADA) may be used as an aid in the diagnosis of diabetes mellitus.  Hemoglobin             Suggested A1C NGSP%              Diagnosis  <5.7                   Non Diabetic  5.7-6.4  Pre-Diabetic  >6.4                   Diabetic  <7.0                   Glycemic control for                       adults with diabetes.     Mean Plasma Glucose 05/15/2024 91.06  mg/dL Final   Performed at [facility], [address], [place], [place] [address]   Cholesterol 05/15/2024 187  0 - 200 mg/dL Final   Triglycerides 92/79/7974 186 (H)  <150 mg/dL Final   HDL 92/79/7974 59  >40 mg/dL Final   Total CHOL/HDL Ratio 05/15/2024 3.2  RATIO Final   VLDL 05/15/2024 37  0 - 40 mg/dL Final   LDL Cholesterol 05/15/2024 91  0 - 99 mg/dL Final   Comment:        Total Cholesterol/HDL:CHD Risk Coronary Heart Disease Risk Table                     Men   Women  1/2 Average Risk   3.4   3.3  Average Risk       5.0   4.4  2 X Average Risk   9.6   7.1  3 X Average Risk  23.4   11.0        Use the calculated Patient Ratio above and the CHD Risk Table to determine the patient's CHD Risk.        ATP III CLASSIFICATION (LDL):  <100     mg/dL   Optimal  899-870  mg/dL   Near or Above                    Optimal  130-159  mg/dL   Borderline  839-810  mg/dL   High  >809     mg/dL   Very High Performed at [facility], [address] [place], [place] [address]    Vit D, 25-Hydroxy 05/15/2024 78.38  30 - 100 ng/mL Final   Comment: (NOTE) Vitamin D  deficiency has been defined by the Institute of Medicine  and an Endocrine Society practice guideline as a level of serum 25-OH  vitamin D  less than 20 ng/mL (1,2). The Endocrine Society went on to  further define vitamin D  insufficiency as a level between 21 and 29  ng/mL (2).  1. IOM  (Institute of Medicine). 2010. Dietary reference intakes for  calcium  and D. [place]: [employer]. 2. Holick MF, Binkley DeWitt, Bischoff-Ferrari HA, et al. Evaluation,  treatment, and prevention of vitamin D  deficiency: an Endocrine  Society clinical practice guideline, JCEM. 2011 Jul; 96(7): 1911-30.  Performed at [facility], [address] [place], [place] [address]    Vitamin B-12 05/15/2024 265  180 - 914 pg/mL Final   Comment: (NOTE) This assay is not validated for testing neonatal or myeloproliferative syndrome specimens for Vitamin B12 levels. Performed at [facility], [address], [place], [place] [address]    Magnesium  05/15/2024 1.9  1.7 - 2.4 mg/dL Final   Performed at [facility], [address] [place], [place] [address]   TSH 05/15/2024 1.255  0.350 - 4.500 uIU/mL Final   Comment: Performed by a 3rd Generation assay with a functional sensitivity of <=0.01 uIU/mL. Performed at [facility], [address] [place], [place] [address]   Admission on 05/13/2024, Discharged on 05/13/2024  Component Date Value Ref Range Status   WBC 05/13/2024 8.3  4.0 - 10.5 K/uL Final   RBC 05/13/2024 5.19  4.22 - 5.81 MIL/uL Final   Hemoglobin 05/13/2024 16.7  13.0 - 17.0 g/dL Final   HCT 92/81/7974 48.0  39.0 - 52.0 % Final   MCV 05/13/2024 92.5  80.0 - 100.0 fL Final   MCH 05/13/2024 32.2  26.0 - 34.0 pg Final   MCHC 05/13/2024 34.8  30.0 - 36.0 g/dL Final   RDW 92/81/7974 13.5  11.5 - 15.5 % Final   Platelets 05/13/2024 329  150 - 400 K/uL Final   nRBC 05/13/2024 0.0  0.0 - 0.2 % Final   Neutrophils Relative % 05/13/2024 80  % Final   Neutro Abs 05/13/2024 6.6  1.7 - 7.7 K/uL Final   Lymphocytes Relative 05/13/2024 14  % Final   Lymphs Abs 05/13/2024 1.2  0.7 - 4.0 K/uL Final   Monocytes Relative 05/13/2024 5  % Final   Monocytes Absolute 05/13/2024 0.4  0.1 - 1.0 K/uL Final   Eosinophils Relative 05/13/2024 0  % Final    Eosinophils Absolute 05/13/2024 0.0  0.0 - 0.5 K/uL Final   Basophils Relative 05/13/2024 0  % Final   Basophils Absolute 05/13/2024 0.0  0.0 - 0.1 K/uL Final   Immature Granulocytes 05/13/2024 1  % Final   Abs Immature Granulocytes 05/13/2024 0.04  0.00 - 0.07 K/uL Final   Performed at [facility], [address] [place], [place] [address]   Sodium 05/13/2024 138  135 - 145 mmol/L Final   Potassium 05/13/2024 3.9  3.5 - 5.1 mmol/L Final   Chloride 05/13/2024 97 (L)  98 - 111 mmol/L Final   CO2 05/13/2024 30  22 - 32 mmol/L Final   Glucose, Bld 05/13/2024 103 (H)  70 - 99 mg/dL Final   Glucose reference range applies only to samples taken after fasting for at least 8 hours.   BUN 05/13/2024 8  6 - 20 mg/dL Final   Creatinine, Ser 05/13/2024 0.82  0.61 - 1.24 mg/dL Final   Calcium  05/13/2024 9.0  8.9 - 10.3 mg/dL Final   Total Protein 92/81/7974 7.1  6.5 - 8.1 g/dL Final   Albumin 92/81/7974 4.1  3.5 - 5.0 g/dL Final   AST 92/81/7974 23  15 - 41 U/L Final   ALT 05/13/2024 21  0 - 44 U/L Final   Alkaline Phosphatase 05/13/2024 80  38 - 126 U/L Final   Total Bilirubin 05/13/2024 0.8  0.0 - 1.2 mg/dL Final   GFR, Estimated 05/13/2024 >60  >60 mL/min Final   Comment: (NOTE) Calculated using the CKD-EPI Creatinine Equation (2021)    Anion gap 05/13/2024 11  5 - 15 Final   Performed at [facility], [address], [place], [place] [address]   Magnesium  05/13/2024 1.7  1.7 - 2.4 mg/dL Final   Performed at [facility], [address] [place], [place] [address]   Alcohol, Ethyl (B) 05/13/2024 52 (H)  <15 mg/dL Final   Comment: (NOTE) For medical purposes only. Performed at [facility], [address] [place], [place] [address]    POC Amphetamine UR 05/13/2024 None Detected  NONE DETECTED (Cut Off Level 1000 ng/mL) Final   POC Secobarbital (BAR) 05/13/2024 None Detected  NONE DETECTED (Cut Off Level 300 ng/mL) Final  POC Buprenorphine (BUP) 05/13/2024 None  Detected  NONE DETECTED (Cut Off Level 10 ng/mL) Final   POC Oxazepam (BZO) 05/13/2024 None Detected  NONE DETECTED (Cut Off Level 300 ng/mL) Final   POC Cocaine UR 05/13/2024 None Detected  NONE DETECTED (Cut Off Level 300 ng/mL) Final   POC Methamphetamine UR 05/13/2024 None Detected  NONE DETECTED (Cut Off Level 1000 ng/mL) Final   POC Morphine  05/13/2024 None Detected  NONE DETECTED (Cut Off Level 300 ng/mL) Final   POC Methadone UR 05/13/2024 None Detected  NONE DETECTED (Cut Off Level 300 ng/mL) Final   POC Oxycodone  UR 05/13/2024 None Detected  NONE DETECTED (Cut Off Level 100 ng/mL) Final   POC Marijuana UR 05/13/2024 None Detected  NONE DETECTED (Cut Off Level 50 ng/mL) Final  Admission on 01/23/2024, Discharged on 02/02/2024  Component Date Value Ref Range Status   Sodium 01/30/2024 137  135 - 145 mmol/L Final   Potassium 01/30/2024 4.1  3.5 - 5.1 mmol/L Final   Chloride 01/30/2024 101  98 - 111 mmol/L Final   CO2 01/30/2024 30  22 - 32 mmol/L Final   Glucose, Bld 01/30/2024 92  70 - 99 mg/dL Final   Glucose reference range applies only to samples taken after fasting for at least 8 hours.   BUN 01/30/2024 17  6 - 20 mg/dL Final   Creatinine, Ser 01/30/2024 1.03  0.61 - 1.24 mg/dL Final   Calcium  01/30/2024 8.9  8.9 - 10.3 mg/dL Final   Total Protein 95/94/7974 7.2  6.5 - 8.1 g/dL Final   Albumin 95/94/7974 4.1  3.5 - 5.0 g/dL Final   AST 95/94/7974 18  15 - 41 U/L Final   ALT 01/30/2024 19  0 - 44 U/L Final   Alkaline Phosphatase 01/30/2024 43  38 - 126 U/L Final   Total Bilirubin 01/30/2024 0.6  0.0 - 1.2 mg/dL Final   GFR, Estimated 01/30/2024 >60  >60 mL/min Final   Comment: (NOTE) Calculated using the CKD-EPI Creatinine Equation (2021)    Anion gap 01/30/2024 6  5 - 15 Final   Performed at [facility], [address], [place], [place] [address]  Admission on 01/22/2024, Discharged on 01/23/2024  Component Date Value Ref Range Status   WBC 01/22/2024 6.8  4.0 -  10.5 K/uL Final   RBC 01/22/2024 5.22  4.22 - 5.81 MIL/uL Final   Hemoglobin 01/22/2024 17.2 (H)  13.0 - 17.0 g/dL Final   HCT 96/71/7974 47.8  39.0 - 52.0 % Final   MCV 01/22/2024 91.6  80.0 - 100.0 fL Final   MCH 01/22/2024 33.0  26.0 - 34.0 pg Final   MCHC 01/22/2024 36.0  30.0 - 36.0 g/dL Final   RDW 96/71/7974 12.1  11.5 - 15.5 % Final   Platelets 01/22/2024 319  150 - 400 K/uL Final   nRBC 01/22/2024 0.0  0.0 - 0.2 % Final   Neutrophils Relative % 01/22/2024 70  % Final   Neutro Abs 01/22/2024 4.8  1.7 - 7.7 K/uL Final   Lymphocytes Relative 01/22/2024 25  % Final   Lymphs Abs 01/22/2024 1.7  0.7 - 4.0 K/uL Final   Monocytes Relative 01/22/2024 4  % Final   Monocytes Absolute 01/22/2024 0.3  0.1 - 1.0 K/uL Final   Eosinophils Relative 01/22/2024 0  % Final   Eosinophils Absolute 01/22/2024 0.0  0.0 - 0.5 K/uL Final   Basophils Relative 01/22/2024 1  % Final   Basophils Absolute 01/22/2024 0.1  0.0 -  0.1 K/uL Final   Immature Granulocytes 01/22/2024 0  % Final   Abs Immature Granulocytes 01/22/2024 0.02  0.00 - 0.07 K/uL Final   Performed at [facility], [address], [place], [place] [address]   Sodium 01/22/2024 138  135 - 145 mmol/L Final   Potassium 01/22/2024 3.9  3.5 - 5.1 mmol/L Final   Chloride 01/22/2024 99  98 - 111 mmol/L Final   CO2 01/22/2024 25  22 - 32 mmol/L Final   Glucose, Bld 01/22/2024 99  70 - 99 mg/dL Final   Glucose reference range applies only to samples taken after fasting for at least 8 hours.   BUN 01/22/2024 13  6 - 20 mg/dL Final   Creatinine, Ser 01/22/2024 0.83  0.61 - 1.24 mg/dL Final   Calcium  01/22/2024 8.9  8.9 - 10.3 mg/dL Final   Total Protein 96/71/7974 7.5  6.5 - 8.1 g/dL Final   Albumin 96/71/7974 4.6  3.5 - 5.0 g/dL Final   AST 96/71/7974 44 (H)  15 - 41 U/L Final   ALT 01/22/2024 46 (H)  0 - 44 U/L Final   Alkaline Phosphatase 01/22/2024 60  38 - 126 U/L Final   Total Bilirubin 01/22/2024 0.8  0.0 - 1.2 mg/dL Final   GFR,  Estimated 01/22/2024 >60  >60 mL/min Final   Comment: (NOTE) Calculated using the CKD-EPI Creatinine Equation (2021)    Anion gap 01/22/2024 14  5 - 15 Final   Performed at [facility], [address] [place], [place] [address]   Hgb A1c MFr Bld 01/22/2024 5.0  4.8 - 5.6 % Final   Comment: (NOTE) Pre diabetes:          5.7%-6.4%  Diabetes:              >6.4%  Glycemic control for   <7.0% adults with diabetes    Mean Plasma Glucose 01/22/2024 96.8  mg/dL Final   Performed at [facility], [address], [place], [place] [address]   Magnesium  01/22/2024 1.9  1.7 - 2.4 mg/dL Final   Performed at [facility], [address], [place], [place] [address]   Alcohol, Ethyl (B) 01/22/2024 109 (H)  <10 mg/dL Final   Comment: (NOTE) Lowest detectable limit for serum alcohol is 10 mg/dL.  For medical purposes only. Performed at [facility], [address], [place], [place] [address]    Cholesterol 01/22/2024 202 (H)  0 - 200 mg/dL Final   Triglycerides 96/71/7974 65  <150 mg/dL Final   HDL 96/71/7974 84  >40 mg/dL Final   Total CHOL/HDL Ratio 01/22/2024 2.4  RATIO Final   VLDL 01/22/2024 13  0 - 40 mg/dL Final   LDL Cholesterol 01/22/2024 105 (H)  0 - 99 mg/dL Final   Comment:        Total Cholesterol/HDL:CHD Risk Coronary Heart Disease Risk Table                     Men   Women  1/2 Average Risk   3.4   3.3  Average Risk       5.0   4.4  2 X Average Risk   9.6   7.1  3 X Average Risk  23.4   11.0        Use the calculated Patient Ratio above and the CHD Risk Table to determine the patient's CHD Risk.  ATP III CLASSIFICATION (LDL):  <100     mg/dL   Optimal  899-870  mg/dL   Near or Above                    Optimal  130-159  mg/dL   Borderline  839-810  mg/dL   High  >809     mg/dL   Very High Performed at [facility], [address], [place], [place] [address]    TSH 01/22/2024 0.458  0.350 - 4.500 uIU/mL Final   Comment: Performed  by a 3rd Generation assay with a functional sensitivity of <=0.01 uIU/mL. Performed at [facility], [address], [place], [place] [address]    RPR Ser Ql 01/22/2024 NON REACTIVE  NON REACTIVE Final   Performed at [facility], [address] [place], [place] [address]   Color, Urine 01/22/2024 YELLOW  YELLOW Final   APPearance 01/22/2024 CLEAR  CLEAR Final   Specific Gravity, Urine 01/22/2024 1.020  1.005 - 1.030 Final   pH 01/22/2024 5.0  5.0 - 8.0 Final   Glucose, UA 01/22/2024 NEGATIVE  NEGATIVE mg/dL Final   Hgb urine dipstick 01/22/2024 NEGATIVE  NEGATIVE Final   Bilirubin Urine 01/22/2024 NEGATIVE  NEGATIVE Final   Ketones, ur 01/22/2024 5 (A)  NEGATIVE mg/dL Final   Protein, ur 96/71/7974 NEGATIVE  NEGATIVE mg/dL Final   Nitrite 96/71/7974 NEGATIVE  NEGATIVE Final   Leukocytes,Ua 01/22/2024 NEGATIVE  NEGATIVE Final   Performed at [facility], [address], [place], [place] [address]   POC Amphetamine UR 01/22/2024 None Detected   Final   POC Secobarbital (BAR) 01/22/2024 None Detected   Final   POC Buprenorphine (BUP) 01/22/2024 None Detected   Final   POC Oxazepam (BZO) 01/22/2024 None Detected   Final   POC Cocaine UR 01/22/2024 None Detected   Final   POC Methamphetamine UR 01/22/2024 None Detected   Final   POC Morphine  01/22/2024 None Detected   Final   POC Methadone UR 01/22/2024 None Detected   Final   POC Oxycodone  UR 01/22/2024 None Detected   Final   POC Marijuana UR 01/22/2024 None Detected   Final  Admission on 12/24/2023, Discharged on 12/25/2023  Component Date Value Ref Range Status   Sodium 12/24/2023 144  135 - 145 mmol/L Final   Potassium 12/24/2023 3.8  3.5 - 5.1 mmol/L Final   Chloride 12/24/2023 107  98 - 111 mmol/L Final   CO2 12/24/2023 26  22 - 32 mmol/L Final   Glucose, Bld 12/24/2023 96  70 - 99 mg/dL Final   Glucose reference range applies only to samples taken after fasting for at least 8 hours.   BUN 12/24/2023 11  6 -  20 mg/dL Final   Creatinine, Ser 12/24/2023 0.62  0.61 - 1.24 mg/dL Final   Calcium  12/24/2023 8.6 (L)  8.9 - 10.3 mg/dL Final   Total Protein 97/72/7974 7.8  6.5 - 8.1 g/dL Final   Albumin 97/72/7974 4.3  3.5 - 5.0 g/dL Final   AST 97/72/7974 34  15 - 41 U/L Final   ALT 12/24/2023 27  0 - 44 U/L Final   Alkaline Phosphatase 12/24/2023 56  38 - 126 U/L Final   Total Bilirubin 12/24/2023 0.4  0.0 - 1.2 mg/dL Final   GFR, Estimated 12/24/2023 >60  >60 mL/min Final   Comment: (NOTE) Calculated using the CKD-EPI Creatinine Equation (2021)    Anion gap 12/24/2023 11  5 - 15 Final   Performed at [facility], [address] [place], [place] [address]   Alcohol, Ethyl (B) 12/24/2023 436 (HH)  <10 mg/dL Final   Comment: CRITICAL RESULT CALLED TO, READ BACK BY AND VERIFIED WITH CANDIE MOLT, RN 12/25/23 0050 BY K. DAVIS (NOTE) Lowest detectable limit for serum alcohol is 10 mg/dL.  For medical purposes only. Performed at [facility], [address] [place], [place] [address]    Opiates 12/25/2023 NONE DETECTED  NONE DETECTED Final   Cocaine 12/25/2023 NONE DETECTED  NONE DETECTED Final   Benzodiazepines 12/25/2023 NONE DETECTED  NONE DETECTED Final   Amphetamines 12/25/2023 NONE DETECTED  NONE DETECTED Final   Tetrahydrocannabinol 12/25/2023 NONE DETECTED  NONE DETECTED Final   Barbiturates 12/25/2023 NONE DETECTED  NONE DETECTED Final   Comment: (NOTE) DRUG SCREEN FOR MEDICAL PURPOSES ONLY.  IF CONFIRMATION IS NEEDED FOR ANY PURPOSE, NOTIFY LAB WITHIN 5 DAYS.  LOWEST DETECTABLE LIMITS FOR URINE DRUG SCREEN Drug Class                     Cutoff (ng/mL) Amphetamine and metabolites    1000 Barbiturate and metabolites    200 Benzodiazepine                 200 Opiates and metabolites        300 Cocaine and metabolites        300 THC                            50 Performed at [facility], [address], [place], [place]  [address]    WBC 12/24/2023 5.1  4.0 - 10.5 K/uL Final   RBC 12/24/2023 4.78  4.22 - 5.81 MIL/uL Final   Hemoglobin 12/24/2023 16.0  13.0 - 17.0 g/dL Final   HCT 97/72/7974 45.7  39.0 - 52.0 % Final   MCV 12/24/2023 95.6  80.0 - 100.0 fL Final   MCH 12/24/2023 33.5  26.0 - 34.0 pg Final   MCHC 12/24/2023 35.0  30.0 - 36.0 g/dL Final   RDW 97/72/7974 12.8  11.5 - 15.5 % Final   Platelets 12/24/2023 292  150 - 400 K/uL Final   nRBC 12/24/2023 0.0  0.0 - 0.2 % Final   Neutrophils Relative % 12/24/2023 61  % Final   Neutro Abs 12/24/2023 3.0  1.7 - 7.7 K/uL Final   Lymphocytes Relative 12/24/2023 31  % Final   Lymphs Abs 12/24/2023 1.6  0.7 - 4.0 K/uL Final   Monocytes Relative 12/24/2023 8  % Final   Monocytes Absolute 12/24/2023 0.4  0.1 - 1.0 K/uL Final   Eosinophils Relative 12/24/2023 0  % Final   Eosinophils Absolute 12/24/2023 0.0  0.0 - 0.5 K/uL Final   Basophils Relative 12/24/2023 0  % Final   Basophils Absolute 12/24/2023 0.0  0.0 - 0.1 K/uL Final   Immature Granulocytes 12/24/2023 0  % Final   Abs Immature Granulocytes 12/24/2023 0.02  0.00 - 0.07 K/uL Final   Performed at [facility], [address], [place], [place] [address]   Lipase 12/24/2023 41  11 - 51 U/L Final   Performed at [facility], [address] [place], [place] [address]   Color, Urine 12/25/2023 YELLOW  YELLOW Final   APPearance 12/25/2023 HAZY (A)  CLEAR Final   Specific Gravity, Urine 12/25/2023 1.010  1.005 -  1.030 Final   pH 12/25/2023 7.0  5.0 - 8.0 Final   Glucose, UA 12/25/2023 NEGATIVE  NEGATIVE mg/dL Final   Hgb urine dipstick 12/25/2023 NEGATIVE  NEGATIVE Final   Bilirubin Urine 12/25/2023 NEGATIVE  NEGATIVE Final   Ketones, ur 12/25/2023 NEGATIVE  NEGATIVE mg/dL Final   Protein, ur 97/71/7974 NEGATIVE  NEGATIVE mg/dL Final   Nitrite 97/71/7974 NEGATIVE  NEGATIVE Final   Leukocytes,Ua 12/25/2023 NEGATIVE  NEGATIVE Final   Performed at [facility], [place], [address], [place], [place] [address]    Blood Alcohol level:  Lab Results  Component Value Date   ETH 52 (H) 05/13/2024   ETH 109 (H) 01/22/2024    Metabolic Disorder Labs: Lab Results  Component Value Date   HGBA1C 4.8 05/15/2024   MPG 91.06 05/15/2024   MPG 96.8 01/22/2024   No results found for: PROLACTIN Lab Results  Component Value Date   CHOL 187 05/15/2024   TRIG 186 (H) 05/15/2024   HDL 59 05/15/2024   CHOLHDL 3.2 05/15/2024   VLDL 37 05/15/2024   LDLCALC 91 05/15/2024   LDLCALC 105 (H) 01/22/2024    Therapeutic Lab Levels: No results found for: LITHIUM No results found for: VALPROATE No results found for: CBMZ  Physical Findings   AUDIT    Flowsheet Row ED from 05/13/2024 in [facility] ED from 01/23/2024 in [facility]  Alcohol Use Disorder Identification Test Final Score (AUDIT) 26 36   GAD-7    Flowsheet Row Counselor from 10/22/2023 in [facility]  Total GAD-7 Score 8   PHQ2-9    Flowsheet Row ED from 05/13/2024 in [facility] ED from 01/23/2024 in [facility] ED from 01/22/2024 in [facility] Counselor from 10/22/2023 in [facility] Office Visit from 08/22/2016 in [facility] Total Score 2 3 4  0 0  PHQ-9 Total Score 9 14 12  -- --   Flowsheet Row ED from 05/13/2024 in [facility] Most recent reading at 05/13/2024  4:39 PM ED from 05/13/2024 in [facility] Most recent reading at 05/13/2024  3:20 PM ED from 01/23/2024 in [facility] Most recent reading at 01/23/2024 11:16 AM  C-SSRS RISK CATEGORY No Risk No Risk Low Risk     Musculoskeletal  Strength & Muscle Tone: within normal limits Gait &  Station: normal Patient leans: N/A  Psychiatric Specialty Exam  Presentation  General Appearance:  Appropriate for Environment  Eye Contact: Fair  Speech: Clear and Coherent  Speech Volume: Normal  Handedness: Right   Mood and Affect  Mood: Euthymic  Affect: Congruent   Thought Process  Thought Processes: Coherent  Descriptions of Associations:Intact  Orientation:Full (Time, Place and Person)  Thought Content:Logical  Diagnosis of Schizophrenia or Schizoaffective disorder in past: No    Hallucinations:Hallucinations: None  Ideas of Reference:None  Suicidal Thoughts:Suicidal Thoughts: No  Homicidal Thoughts:Homicidal Thoughts: No   Sensorium  Memory: Immediate Fair; Recent Fair; Remote Fair  Judgment: Fair  Insight: Fair   Art therapist  Concentration: Fair  Attention Span: Fair  Recall: [employer] of Knowledge: Fair  Language: Fair   Psychomotor Activity  Psychomotor Activity: Psychomotor Activity: Normal   Assets  Assets: Manufacturing systems engineer; Desire for Improvement; Leisure Time; Physical Health   Sleep  Sleep: Sleep: Fair  Physical Exam  Physical Exam Eyes:     Conjunctiva/sclera: Conjunctivae normal.  Cardiovascular:     Rate and Rhythm: Normal rate.  Pulmonary:     Effort: Pulmonary effort is normal.  Musculoskeletal:        General: Normal range of motion.     Cervical back: Normal range of motion.  Neurological:     Mental Status: He is alert and oriented to person, place, and time.    Review of Systems  Constitutional: Negative.   HENT: Negative.    Eyes: Negative.   Respiratory: Negative.    Cardiovascular: Negative.   Gastrointestinal: Negative.   Genitourinary: Negative.   Musculoskeletal: Negative.   Endo/Heme/Allergies: Negative.    Blood pressure 113/69, pulse 71, temperature 98 F (36.7 C), temperature source Oral, resp. rate 18, SpO2 99%. There is no height or weight on file  to calculate BMI.  Treatment Plan Summary:  Medications - Ativan  taper was completed on 7/21 - Librium  25 mg taper ends on 7/25 -Zoloft  100 mg daily for depression      PRNS -Continue Tylenol  650 mg every 6 hours PRN for mild pain -Continue Maalox 30 mg every 4 hrs PRN for indigestion -Continue Milk of Magnesia as needed every 6 hrs for constipation -Agitation protocol as per the [facility] -Hydroxyzine  25 mg every 6 hours as needed -Continue Trazodone  100 mg nightly prn for sleep  Labs Reviewed: Ordered Ha1c, Vits B12, D, TSH, Lipi d panel, Mg. EKG WNL.     Discharge Planning: Social work and case management to assist with discharge planning and identification of hospital follow-up needs prior to discharge Estimated LOS: 5-7 days Discharge Concerns: Need to establish a safety plan; Medication compliance and effectiveness Discharge Goals: Return home with outpatient referrals for mental health follow-up including medication management/psychotherapy Anticipated discharge 7/26. Follow up with IOP     Carlos Heber, Wyline CROME, NP 05/19/2024 1:05 PM

## 2024-05-19 NOTE — ED Notes (Signed)
 Pt denies withdrawn symptoms. Pt affirms to have eaten dinner, pt denies complaints at this time.

## 2024-05-20 DIAGNOSIS — B182 Chronic viral hepatitis C: Secondary | ICD-10-CM | POA: Diagnosis not present

## 2024-05-20 DIAGNOSIS — F1410 Cocaine abuse, uncomplicated: Secondary | ICD-10-CM | POA: Diagnosis not present

## 2024-05-20 DIAGNOSIS — A6002 Herpesviral infection of other male genital organs: Secondary | ICD-10-CM | POA: Diagnosis not present

## 2024-05-20 DIAGNOSIS — F1020 Alcohol dependence, uncomplicated: Secondary | ICD-10-CM | POA: Diagnosis not present

## 2024-05-20 MED ORDER — SERTRALINE HCL 100 MG PO TABS: 100.0000 mg | ORAL_TABLET | Freq: Every day | ORAL | 0 refills | Status: DC

## 2024-05-20 MED ORDER — VITAMIN B-1 100 MG PO TABS: 100.0000 mg | ORAL_TABLET | Freq: Every day | ORAL | Status: DC

## 2024-05-20 MED ORDER — ADULT MULTIVITAMIN W/MINERALS CH: 1.0000 | ORAL_TABLET | Freq: Every day | ORAL | Status: DC

## 2024-05-20 MED ORDER — HYDROXYZINE HCL 25 MG PO TABS: 25.0000 mg | ORAL_TABLET | Freq: Four times a day (QID) | ORAL | 0 refills | Status: DC | PRN

## 2024-05-20 NOTE — ED Provider Notes (Signed)
 FBC/OBS ASAP Discharge Summary  Date and Time: 05/20/2024 11:08 AM  Name: Trevor [name]  MRN:  [id number]   Discharge Diagnoses:  Final diagnoses:  Cocaine use disorder (HCC)  Alcohol dependence with unspecified alcohol-induced disorder (HCC)  History of alcohol withdrawal delirium  History of seizure due to alcohol withdrawal  Herpes simplex type 2 infection  Chronic hepatitis C without hepatic coma (HCC)    Subjective: Patient seen and evaluated face-to-face by this provider, chart reviewed and case discussed with Dr. Leigh and [place], LCSW. Patient states that he has not had any withdrawal symptoms in the past 2 days and was wondering if he could discharge today or if he had to wait and take his last dose of Librium  tonight and discharged tomorrow. He states that his 22-year-old daughter is coming into town from [place]  with her grandparents to see him. He states that he plans to live with his brother who has been sober for 4 years. He denies access to firearms. He also states that his parents live around [place] area and are supportive. He denies suicidal thoughts. He denies homicidal thoughts. He denies depressive or anxiety symptoms. He denies auditory or visual hallucinations. No paranoia. He reports good appetite. He denies difficulty sleeping. He denies medication side effects. He denies physical complaints. His plan is to follow up with IOP to maintain his sobriety. He is scheduled on Monday 7/28 to follow up here at the [facility] for substance abuse treatment/psychiatry to maintain sobriety.  Stay Summary: Trevor [name] is a 43 year old male with a history of alcohol use disorder who presented to the [facility] voluntary on 05/13/2024 seeking alcohol detox. He reported drinking a 24 mini bottles of liquor and 1 to 2 g of cocaine, 3-4 times per week. BAL was 52 on arrival. Urine  drug screen was negative. All other labs were unremarkable. He was restarted on home medication sertraline  100 mg daily for depression/anxiety. He was started on an Ativan  taper for alcohol withdrawals. Ativan  taper was completed on 7/21 however, on 07/22 his withdrawal symptoms worsened and he began having crawling sensation, seizure-like activity and was started on a Librium  taper to reduce risks of alcohol withdrawal seizures/delirium tremens. Librium  taper scheduled to end on 7/26. We previously discussed patient discharging on 7/26 once he completed the Librium  taper on 7/25 at 2200. However, today on 7/25 patient requested to discharge. He denies alcohol withdrawal symptoms and feels stable to discharge. He has not exhibited any alcohol withdrawal symptoms for the past 48 hours. He would like to discharge as he has plans to see his 66-year-old daughter who is coming into town from [place]  to see him. He is scheduled on Monday 7/28 to follow up here at the [facility] for substance abuse treatment/psychiatry to maintain sobriety. He plans to live with his brother who he identifies as supportive and states that he has been sober for 4 years. He states that his parents live in this area and identifies his parents as supportive.    Total Time spent with patient: 30 minutes  Past Psychiatric History: History of alcohol use disorder, MDD and GAD. Four past rehabilitation substance abuse treatment.  Previously prescribed Wellbutrin , Lexapro , trazodone  and Vivitrol .  Past Medical History: Prolonged QTc, hepatitis, hep C, HSV, and alcohol withdrawal seizures and delirium tremens Family History: Mother hx skin cancer, paternal grandfather hx colon cancer and paternal  grandmother and grandfather hx lung cancer. Family Psychiatric  History: brother hx of alcohol abuse, and maternal grandmother and grandfather alcohol abuse Social History:  Divorced, has 1 daughter, unemployed/recently fired and resides with brother.  Tobacco Cessation:  N/A, patient does not currently use tobacco products  Current Medications:  Current Facility-Administered Medications  Medication Dose Route Frequency Provider Last Rate Last Admin   acetaminophen  (TYLENOL ) tablet 650 mg  650 mg Oral Q6H PRN Hobson, Fran E, NP       alum & mag hydroxide-simeth (MAALOX/MYLANTA) 200-200-20 MG/5ML suspension 30 mL  30 mL Oral Q4H PRN Hobson, Fran E, NP       chlordiazePOXIDE  (LIBRIUM ) capsule 25 mg  25 mg Oral Q6H PRN Tex Drilling, NP       chlordiazePOXIDE  (LIBRIUM ) capsule 25 mg  25 mg Oral Daily Nkwenti, Doris, NP       haloperidol  (HALDOL ) tablet 5 mg  5 mg Oral TID PRN Hobson, Fran E, NP       And   diphenhydrAMINE  (BENADRYL ) capsule 50 mg  50 mg Oral TID PRN Hobson, Fran E, NP       haloperidol  lactate (HALDOL ) injection 5 mg  5 mg Intramuscular TID PRN Hobson, Fran E, NP       And   diphenhydrAMINE  (BENADRYL ) injection 50 mg  50 mg Intramuscular TID PRN Hobson, Fran E, NP       And   LORazepam  (ATIVAN ) injection 2 mg  2 mg Intramuscular TID PRN Hobson, Fran E, NP       haloperidol  lactate (HALDOL ) injection 10 mg  10 mg Intramuscular TID PRN Hobson, Fran E, NP       And   diphenhydrAMINE  (BENADRYL ) injection 50 mg  50 mg Intramuscular TID PRN Hobson, Fran E, NP       And   LORazepam  (ATIVAN ) injection 2 mg  2 mg Intramuscular TID PRN Hobson, Fran E, NP       hydrOXYzine  (ATARAX ) tablet 25 mg  25 mg Oral Q6H PRN Tex Drilling, NP   25 mg at 05/18/24 1241   loperamide  (IMODIUM ) capsule 2-4 mg  2-4 mg Oral PRN Tex Drilling, NP       magnesium  hydroxide (MILK OF MAGNESIA) suspension 30 mL  30 mL Oral Daily PRN Hobson, Fran E, NP       multivitamin with minerals tablet 1 tablet  1 tablet Oral Daily Tex Drilling, NP   1 tablet at 05/20/24 0932   ondansetron  (ZOFRAN -ODT) disintegrating tablet 4 mg  4 mg Oral Q6H PRN Tex Drilling, NP       sertraline   (ZOLOFT ) tablet 100 mg  100 mg Oral QHS Nkwenti, Doris, NP   100 mg at 05/19/24 2121   thiamine  (VITAMIN B1) tablet 100 mg  100 mg Oral Daily Hobson, Fran E, NP   100 mg at 05/20/24 0933   traZODone  (DESYREL ) tablet 100 mg  100 mg Oral QHS PRN Tex Drilling, NP   100 mg at 05/19/24 2121   Current Outpatient Medications  Medication Sig Dispense Refill   hydrOXYzine  (ATARAX ) 25 MG tablet Take 1 tablet (25 mg total) by mouth every 6 (six) hours as needed for anxiety. 60 tablet 0   sertraline  (ZOLOFT ) 100 MG tablet Take 1 tablet (100 mg total) by mouth at bedtime. 30 tablet 0   valACYclovir  (VALTREX ) 500 MG tablet Take 500 mg by mouth daily as needed (For outbreaks).      PTA Medications:  Facility Ordered Medications  Medication   [COMPLETED] thiamine  (VITAMIN B1) injection 100 mg   [EXPIRED] hydrOXYzine  (ATARAX ) tablet 25 mg   [EXPIRED] loperamide  (IMODIUM ) capsule 2-4 mg   [EXPIRED] LORazepam  (ATIVAN ) tablet 1 mg   [COMPLETED] LORazepam  (ATIVAN ) tablet 1 mg   Followed by   [COMPLETED] LORazepam  (ATIVAN ) tablet 1 mg   Followed by   [EXPIRED] LORazepam  (ATIVAN ) tablet 1 mg   Followed by   [COMPLETED] LORazepam  (ATIVAN ) tablet 1 mg   [EXPIRED] ondansetron  (ZOFRAN -ODT) disintegrating tablet 4 mg   thiamine  (VITAMIN B1) tablet 100 mg   acetaminophen  (TYLENOL ) tablet 650 mg   alum & mag hydroxide-simeth (MAALOX/MYLANTA) 200-200-20 MG/5ML suspension 30 mL   magnesium  hydroxide (MILK OF MAGNESIA) suspension 30 mL   haloperidol  (HALDOL ) tablet 5 mg   And   diphenhydrAMINE  (BENADRYL ) capsule 50 mg   haloperidol  lactate (HALDOL ) injection 5 mg   And   diphenhydrAMINE  (BENADRYL ) injection 50 mg   And   LORazepam  (ATIVAN ) injection 2 mg   haloperidol  lactate (HALDOL ) injection 10 mg   And   diphenhydrAMINE  (BENADRYL ) injection 50 mg   And   LORazepam  (ATIVAN ) injection 2 mg   sertraline  (ZOLOFT ) tablet 100 mg   traZODone  (DESYREL ) tablet 100 mg   multivitamin with minerals tablet 1  tablet   chlordiazePOXIDE  (LIBRIUM ) capsule 25 mg   hydrOXYzine  (ATARAX ) tablet 25 mg   ondansetron  (ZOFRAN -ODT) disintegrating tablet 4 mg   [COMPLETED] chlordiazePOXIDE  (LIBRIUM ) capsule 25 mg   Followed by   [COMPLETED] chlordiazePOXIDE  (LIBRIUM ) capsule 25 mg   Followed by   [COMPLETED] chlordiazePOXIDE  (LIBRIUM ) capsule 25 mg   Followed by   chlordiazePOXIDE  (LIBRIUM ) capsule 25 mg   loperamide  (IMODIUM ) capsule 2-4 mg   PTA Medications  Medication Sig   valACYclovir  (VALTREX ) 500 MG tablet Take 500 mg by mouth daily as needed (For outbreaks).   hydrOXYzine  (ATARAX ) 25 MG tablet Take 1 tablet (25 mg total) by mouth every 6 (six) hours as needed for anxiety.   sertraline  (ZOLOFT ) 100 MG tablet Take 1 tablet (100 mg total) by mouth at bedtime.       05/16/2024    1:58 PM 05/14/2024    6:35 PM 01/26/2024   11:15 AM  Depression screen PHQ 2/9  Decreased Interest 1 1 2   Down, Depressed, Hopeless 1 1 1   PHQ - 2 Score 2 2 3   Altered sleeping 1 1 3   Tired, decreased energy 1 1 2   Change in appetite 1 1 2   Feeling bad or failure about yourself  1 1 2   Trouble concentrating 1 1 1   Moving slowly or fidgety/restless 1 1 1   Suicidal thoughts 1  0  PHQ-9 Score 9 8 14   Difficult doing work/chores Somewhat difficult Somewhat difficult     Flowsheet Row ED from 05/13/2024 in [facility] Most recent reading at 05/13/2024  4:39 PM ED from 05/13/2024 in [facility] Most recent reading at 05/13/2024  3:20 PM ED from 01/23/2024 in [facility] Most recent reading at 01/23/2024 11:16 AM  C-SSRS RISK CATEGORY No Risk No Risk Low Risk    Musculoskeletal  Strength & Muscle Tone: within normal limits Gait & Station: normal Patient leans: N/A  Psychiatric Specialty Exam  Presentation  General Appearance:  Appropriate for Environment  Eye Contact: Fair  Speech: Clear and Coherent  Speech  Volume: Normal  Handedness: Right   Mood and Affect  Mood: Euthymic  Affect: Congruent   Thought Process  Thought Processes: Coherent  Descriptions of Associations:Intact  Orientation:Full (Time, Place and Person)  Thought Content:Logical  Diagnosis of Schizophrenia or Schizoaffective disorder in past: No    Hallucinations:Hallucinations: None  Ideas of Reference:None  Suicidal Thoughts:Suicidal Thoughts: No  Homicidal Thoughts:Homicidal Thoughts: No   Sensorium  Memory: Immediate Fair; Recent Fair; Remote Fair  Judgment: Fair  Insight: Fair   Art therapist  Concentration: Fair  Attention Span: Fair  Recall: [employer] of Knowledge: Fair  Language: Fair   Psychomotor Activity  Psychomotor Activity: Psychomotor Activity: Normal   Assets  Assets: Communication Skills; Desire for Improvement; Leisure Time; Physical Health   Sleep  Sleep: Sleep: Fair    Physical Exam  Physical Exam HENT:     Nose: Nose normal.  Cardiovascular:     Rate and Rhythm: Normal rate.  Pulmonary:     Effort: Pulmonary effort is normal.  Musculoskeletal:        General: Normal range of motion.     Cervical back: Normal range of motion.  Neurological:     Mental Status: He is alert and oriented to person, place, and time.    Review of Systems  Constitutional: Negative.   HENT: Negative.    Eyes: Negative.   Respiratory: Negative.    Cardiovascular: Negative.   Gastrointestinal: Negative.   Genitourinary: Negative.   Musculoskeletal: Negative.   Neurological: Negative.    Blood pressure 104/67, pulse 62, temperature 98.1 F (36.7 C), resp. rate 18, SpO2 97%. There is no height or weight on file to calculate BMI.  Demographic Factors:  Male, Caucasian, and Low socioeconomic status  Loss Factors: Financial problems/change in socioeconomic status  Historical Factors: NA  Risk Reduction Factors:   Sense of responsibility to  family, Living with another person, especially a relative, Positive social support, and Positive coping skills or problem solving skills  Continued Clinical Symptoms:  Alcohol/Substance Abuse/Dependencies  Cognitive Features That Contribute To Risk:  None    Suicide Risk:  Minimal: No identifiable suicidal ideation.  Patients presenting with no risk factors but with morbid ruminations; may be classified as minimal risk based on the severity of the depressive symptoms  Plan Of Care/Follow-up recommendations:   Based on the information that you have provided and the presenting issues outpatient services and resources for have been recommended. It is imperative that you follow through with treatment recommendations within 5-7 days from the of discharge to mitigate further risk to your safety and mental well-being. A list of referrals has been provided below to get you started.  You are not limited to the list provided.  In case of an urgent crisis, you may contact the Mobile Crisis Unit with [facility] at [phone].  You are scheduled for an assessment on Monday July 28 @ 1:00. Please arrive 20 minutes early and bring your ID and insurance card.   Non-Emergent / Urgent   [facility] [address], [address] FLO[address] [place], [place] [address] [phone] OUTPATIENT Walk-in information: Please note, all walk-ins are first come & first serve, with limited number of availability.   Please note that to be eligible for services you must bring: ID or a piece of mail with your name [facility] address   Therapist for therapy:  Monday & Wednesdays: Please ARRIVE at 7:15 AM for registration Will START at 8:00 AM Every 1st & 2nd Friday of the month: Please ARRIVE at 10:15 AM for registration Will START at 1 PM - 5 PM  Psychiatrist for medication management: Monday - Friday:  Please ARRIVE at 7:15 AM for registration Will START at 8:00 AM    Medications: Patient is to take medications as prescribed. The patient or patient's guardian is to contact a medical professional and/or outpatient provider to address any new side effects that develop. The patient or the patient's guardian should update outpatient providers of any new medications and/or medication changes.    Safety:   The following safety precautions should be taken:   No sharp objects. This includes scissors, razors, scrapers, and putty knives.   Chemicals should be removed and locked up.   Medications should be removed and locked up.   Weapons should be removed and locked up. This includes firearms, knives and instruments that can be used to cause injury.   The patient should abstain from use of illicit substances/drugs and abuse of any medications.  If symptoms worsen or do not continue to improve or if the patient becomes actively suicidal or homicidal then it is recommended that the patient return to the closest hospital emergency department, the [facility], or call 911 for further evaluation and treatment. National Suicide Prevention Lifeline 1-800-SUICIDE or (320) 198-4661.  About 988 988 offers 24/7 access to trained crisis counselors who can help people experiencing mental health-related distress. People can call or text 988 or chat 988lifeline.org for themselves or if they are worried about a loved one who may need crisis support.   Disposition: Discharge home.   Gamaliel Charney L, NP 05/20/2024, 11:08 AM

## 2024-05-20 NOTE — ED Notes (Signed)
 Pt denies withdrawal symptoms. Medication reviewed with pt, questions denied. Pt says he feels 'as normal as he could be' and general condition has improved since admission to facility. Pt reports readiness for discharge. Pt currently having coffee in dayroom. Pt display pleasant attitude, denies physical pain and discomfort. Pt says he plans to eat breakfast.

## 2024-05-20 NOTE — ED Notes (Signed)
 Pt is sleeping . No acute distress noted.

## 2024-05-20 NOTE — ED Notes (Signed)
 Discharge paperwork discussed with pt, including AVS, questions denied. Pt escorted off unit by staff, items returned.

## 2024-05-23 ENCOUNTER — Telehealth (HOSPITAL_COMMUNITY): Payer: Self-pay | Admitting: Licensed Clinical Social Worker

## 2024-05-23 ENCOUNTER — Ambulatory Visit (HOSPITAL_COMMUNITY): Payer: Self-pay

## 2024-05-23 NOTE — Telephone Encounter (Signed)
 The therapist attempts to reach [facility] in response to his no show today and leaves a HIPAA-compliant voicemail with his direct callback number.  Zell Maier, MA, LCSW, [facility], LCAS 05/23/2024

## 2024-05-29 NOTE — ED Triage Notes (Signed)
 Reports alcohol detox. Last drink ~0500.  Reports some cocaine use x 2 weeks ago.  Denies SI/HI. Hx of seizures Noted tremors

## 2024-05-30 NOTE — BH Treatment Plan (Signed)
 Specialty Plan of Care   Patient Name: Trevor [name]      Medical Record Number: [id number]  Date of Birth: 21-Oct-1981 Sex: male  Room/Bed: C501/01 Payor Info: Payor: /          Admit Date/Time: 05/29/2024  7:10 PM  Admitting Diagnosis: Alcohol use disorder, severe, dependence    (CMD) [F10.20] Problem List[1]  Patient Participation in Treatment Planning:   Aware of Plan Content  Care Plan Problems: Substance abuse  Medical Barriers:  Medical History[2]  Medication Changes: .  LORazepam , 1 mg, oral, Q6H **FOLLOWED BY** [START ON 05/31/2024] LORazepam , 1 mg, oral, TID **FOLLOWED BY** [START ON 06/01/2024] LORazepam , 1 mg, oral, BID .  sertraline , 50 mg, oral, Daily .  therapeutic multivitamin, 1 tablet, oral, Daily .  thiamine , 100 mg, oral, Daily     Current Discharge Plan:  Residential Treatment  Expected Discharge Date:  TBD  Reason(s) patient continues to need hospitalization:  Post Acute Detox Symptoms  The patient and/or caregiver continues to be involved in their plan of care and there is mutual agreement on the self-management plan.  The patient specific goals and risk factors from above were discussed with the patient and /or caregiver from a multidisciplinary approach.  All team members listed as present above agree with and sign off on the patient specific goals.        [1] Patient Active Problem List Diagnosis  . Alcohol use disorder, severe, dependence    (CMD)  . Alcohol-induced mood disorder    (CMD)  . Passive suicidal ideations  . Depression  . Anxiety  [2] Past Medical History: Diagnosis Date  . Addiction to drug    (CMD)   . Alcohol abuse   . Bipolar disorder    (CMD)   . Psychosis    (CMD)   . Withdrawal symptoms, alcohol    (CMD)

## 2024-05-30 NOTE — Care Plan (Signed)
 Care plan initiated: Problem: AH BH Drug/Alcohol/Tobacco Goal: Patient will express a desire to quit or reduce the use of substances (STG) Outcome: Progressing

## 2024-05-30 NOTE — ED Provider Notes (Signed)
 EM Observation Re-evaluation note  Trevor [name] is a 43 y.o. male, seen on rounds today.  The patient presented with a chief complaint of requesting alcohol detox.  Currently, the patient is resting.     Temp:  [97.5 F (36.4 C)-98.6 F (37 C)] 98.6 F (37 C) Heart Rate:  [68-85] 68 Resp:  [16-22] 16 BP: (134-161)/(64-97) 134/81 SpO2:  [97 %-99 %] 99 %  clear to auscultation bilaterally, non labored breathing regular rate and rhythm extremities normal, atraumatic, no cyanosis or edema Psychiatric examination: WNL  MDM:  I have reviewed the labs performed to date as well as medications administered while in observation. Recent changes in the last 24 hours include none.  Current plan is for psychiatric evaluation to determine disposition.  Patient is not under full IVC at this time.    This document serves as a record of services personally performed by Rock Birmingham, MD. It was created on their behalf by Chiquita DELENA Mirza, Scribe, a trained medical scribe. The creation of this record is the provider's dictation and/or activities during the visit.   Electronically signed by: Chiquita DELENA Mirza, Scribe 05/30/2024 6:03 AM  I agree the documentation is accurate and complete. Reviewed by: Rock Birmingham, MD 05/30/2024 6:03 AM

## 2024-05-31 NOTE — Care Plan (Signed)
  Problem: [facility] Discharge Planning Goal: Patient will have adequate discharge plan for aftercare (LTG) Outcome: Progressing     Treatment Team Note - CM attended Treatment Team. Pt not scheduled for d/c today. CM to continue f/u planning and transportation arrangements w/pt for d/c.  CM met with patient to discuss discharge planning. CM met with patient outside of the day room. Patient said he spoke with [facility] for his interview and that they requested records. Patient was informed that [facility] has his referral, but CM is waiting on return call from admissions coordinator. Patient was in a bright mood. Patient denied any current concerns at this time. Patient denied suicidal ideation, homicidal ideation and auditory/visual hallucinations at this time.

## 2024-06-01 NOTE — Progress Notes (Signed)
 ------------------------------------------------------------------------------- Attestation signed by Powell Berg, MD at 06/01/2024  9:34 AM I saw and evaluated the patient.  I have reviewed the notes, assessments, and/or procedures performed by Dr Derenda Addiction Fellow, I concur with her/his documentation of Trevor [name].  -------------------------------------------------------------------------------   Psychiatry Progress Note   Date of Service: 06/01/2024   Assessment   Psychiatric Diagnoses: Alcohol Use Disorder, Severe (F10.20) (Primary Diagnosis)   Psychosocial and contextual factors: Risk Factors: access to lethal weapons and substance abuse Protective Factors: spiritual/religious beliefs, capacity for reality testing, sense of responsibility to family, and agrees to treatment plan and follow up   Reason for admission: Trevor [name] is a 43 y.o. male with a pertinent history of anxiety and alcohol use  disorder who presented to the ED for alcohol detox. Psychiatry consulted due to alcohol detox.    Alcohol use disorder  - LFTs wnl , ethanol 13  - pt reporting withdrawal symptoms of mild tremor, denies nausea, vomiting, AVH  - (08/05) given naltrexone  50mg  > (08/06) transition to IM Vivitrol   - (08/04) start Ativan  taper, pt currently on Ativan  1mg  BID, taper daily  - pt reports hx of being placed on antidepressants when seeking treatment for alcohol use disorder, has been on wellbutrin  and zoloft  but did not feel like these medications affected him; pt currently denies symptoms of depression, denies SI HI AVH ; zoloft  50mg  daily discontinued (08/05) ; discussed with patient that if he begins to experience deteriorating symptoms, we can discuss resuming the zoloft     Due to severity of symptoms and failure of outpatient management of patient's symptoms, the patient currently meets criteria for inpatient psychiatric care for medication management and acute  crisis stabilization.     Plan  Legal Status: Pt is voluntarily admitted.     Precautions: seizure and DT/ETOH   Risks, benefits and side effects of current medications reviewed.  - The following home medications were continued:  Hydroxyzine  50mg  QID prn anxiety - The following home medications were not continued: Zoloft  50mg  daily - The following new medications were initiated:  08/04 start Ativan  taper, pt currently on Ativan  1mg  BID  08/05 start naltrexone  50mg  > 08/06 IM Vivitrol  given   Relevant labs: CBC: wnl CMP: wnl   Alcohol Biomarkers: MCV: 92/GGT: 25/AST:21 /ALT:22 UDS: positive for benzodiazepines    Alcohol: 13 Imaging <redacted file path>: No results found. Hgb A1c: not ordered    Lipid Profile:not ordered TSH: wnl EKG Qtc:437   UA:neg      Discharge plan: Plan to discharge to residential facility 08/07  Subjective/Interval History   Trevor [name] is a 43 y.o. male with a pertinent history of anxiety and alcohol use  disorder who presented to the ED for alcohol detox. Psychiatry consulted due to alcohol detox.   On assessment, the patient reports symptoms of anxiety, feeling nervousness. States he still does not feel completely well, not at his 100%. Reports he did sleep well overnight and tolerating appetite. He tolerated naltrexone  yesterday and denies side effects. Agreeable to try Vivitrol  today. Pt has been looking into residential facilities and is interested in a long term residential facility as he feels this is where he would get the most benefit and help.   Suicidal Ideation: denies  Homicidal Ideation: denies    Objective   Vitals:  Vitals:   06/01/24 0727  BP: 118/77  Pulse: 75  Resp: 20  Temp: 98.2 F (36.8 C)  SpO2: 98%    Labs:  No results found for this or any previous visit (from the past 24 hours).  Medical Review Of Systems: Review of Systems - General ROS: positive for  - fatigue negative for - fever or sleep  disturbance Psychological ROS: positive for - anxiety negative for - depression, irritability, or suicidal ideation Respiratory ROS: no cough, shortness of breath, or wheezing Cardiovascular ROS: no chest pain or dyspnea on exertion Musculoskeletal ROS: negative   Mental Status Examination: General Appearance normal body habitus and hygiene appropriate  General Behavior cooperative and appropriate eye contact  Psychomotor Activity normoactive  Gait and Station no gait abnormalities  Speech   normal rate, fluent, normal volume, normal tone, and normal amount  Mood   Still dont feel 100%  Affect    congruent  Thought Process linear/organized  Associations Intact  Thought Content/Perceptual Disturbances Denies suicidal/homicidal ideation and auditory/visual hallucinations.  Cognition/Sensorium  orientation grossly intact, memory intact, and language normal  Insight  limited  Judgment fair    Discussed and seen with Attending Dr. Vicenta Lynita Stagger, DO 06/01/2024 8:06 AM [facility] Department of Psychiatry & Behavioral Medicine

## 2024-06-02 NOTE — Nursing Note (Addendum)
 [facility] BH SHIFT SUMMARY  Patient alert oriented X's 4, Ativan  detox completed, medication compliant, no PRN's administered, denied pain, denied SI/HI, A/V/H, attended a.m. group, safety precautions in place.   Psychosocial (WDL): Within Defined Limits (06/02/24 0900)        Total Hours of Sleep: 11 (Per/night shift report) (06/02/24 0900) Sleep Pattern: Sleeps all night (Per/night shift report) (06/02/24 0900)  1. Have you wished you were dead or wished you could go to sleep and not wake up? (Since Last Asked): No (06/02/24 1000) 2. Have you actually had any thoughts of killing yourself? (Since Last Asked): No (06/02/24 1000) 6. Have you done anything, started to do anything, or prepared to do anything to end your life? (Since Last Asked): No (06/02/24 1000) Calculated C-SSRS Risk Score (Since Last Asked): No Risk Indicated (06/02/24 1000)

## 2024-06-02 NOTE — Discharge Summary (Signed)
 ------------------------------------------------------------------------------- Attestation signed by Powell Berg, MD at 06/02/2024  1:36 PM I saw and evaluated the patient.  I have reviewed the notes, assessments, and/or procedures performed by Dr Glennon Psych Resident, I concur with her/his documentation of Trevor [name].  -------------------------------------------------------------------------------     Psychiatry Discharge Summary   Admit date: 05/29/2024  7:10 PM  Discharge date and time: 06/02/2024  Discharge Physician: Jayson Anice Glennon, MD, Dr. Powell Berg, MD (Attending Physician)  Time Spent on Discharge Summary: [facility] Course   Trevor [name] is a 43 y.o. male with a pertinent history of anxiety and alcohol use  disorder who presented to the ED for alcohol detox in the context of multiple psychosocial stressors.  On 05/29/2024  7:10 PM this patient was voluntarily admitted to the psychiatric unit and placed on suicide and elopement precautions. The patient was seen daily and case discussed in a multidisciplinary team meeting.  Alcohol use disorder  - LFTs wnl , ethanol 13  - pt reporting withdrawal symptoms of mild tremor, denies nausea, vomiting, AVH  - (08/05) given naltrexone  50mg  > (08/06) transition to IM Vivitrol   - (08/04) start Ativan  taper, pt currently on Ativan  1mg  BID, taper daily  - pt reports hx of being placed on antidepressants when seeking treatment for alcohol use disorder, has been on wellbutrin  and zoloft  but did not feel like these medications affected him; pt currently denies symptoms of depression, denies SI HI AVH ; zoloft  50mg  daily discontinued (08/05) ; discussed with patient that if he begins to experience deteriorating symptoms, we can discuss resuming the zoloft  - 8/7: Ativan  taper completed, no changes  At time of discharge, patient denies SI, HI, AVH and is able to contract for safety. The patient reports  intermittent sleep, states that may be associated with discontinuation of Zoloft . Reports that would like to remain off of Zoloft  at this time, denies depressive symptoms. Reports stable appetite, denies current withdrawal symptoms. Endorses concern for seizure due to history of withdrawal seizures, discussed with the patient current plan and he expressed understanding. Mood described as Alright, Lethargic. All questions and concerns that were voiced were addressed.  The following medications were started/changed during the hospitalization:  Risks, benefits and side effects of current medications reviewed.  - The following home medications were continued:  Hydroxyzine  50mg  QID prn anxiety - The following home medications were not continued: Zoloft  50mg  daily - The following new medications were initiated:  08/04 start Ativan  taper, pt currently on Ativan  1mg  BID  08/05 start naltrexone  50mg  > 08/06 IM Vivitrol  given 8/7: No changes   Behavior while hospitalized: cooperative and pleasant PRN medications required: Ativan  x4, Hydroxyzine  x2 Medication compliance: Yes Group therapy attendance: Partial  Medical issues addressed during this admission: N/A Admission labs revealed the following pertinent positives:  CBC: wnl CMP: wnl   Alcohol Biomarkers: MCV: 92/GGT: 25/AST:21 /ALT:22 UDS: positive for benzodiazepines    Alcohol: 13 Imaging <redacted file path>: No results found. Hgb A1c: not ordered    Lipid Profile:not ordered TSH: wnl EKG Qtc:437   UA:neg   On 06/02/2024 the treatment team decided to discharge Trevor [name] home with scheduled outpatient mental health follow up appointments. Risks, benefits and side effects of medications were discussed in detail prior to discharge. Trevor [name] was duly informed to call 911 or go to the nearest emergency department if patient becomes overtly distressed or develops suicidal, homicidal or other violent ideation and they  verbalized understanding.  Follow-up   You were admitted for detox from alcohol. Please follow-up with your primary care doctor soon after discharge. You were discontinued on Zoloft  due to not reporting symptoms of depression and anxiety, if depression and anxiety symptoms return please follow-up with primary care doctor or psychiatry for management. If depressive and anxiety symptoms acutely worsen, please present to the ED for assessment.  On 8/6, you received your long acting monthly Vivitrol  (naltrexone ) injection.  In 1 month, please obtain another injection or talk to your provider about switching to the oral daily 50 mg naltrexone  tablet.  - Alcohol Use Disorder: General Info (English) - Alcohol Withdrawal: General Info (English) - Naltrexone  Injection ([place])  Additional Information Discharge Follow-Up Plan:   Clinical Follow-up (MH and/or SA Services):  Name of Provider Agency Referred: [facility] Date/Time of Appointment: Call daily to check on status of referral Address:[address] [place], [place]  [address] Phone: [phone]                       Reason: SA RESIDENTIAL TREATMENT   Transportation:  Family   Crisis Support Line: [phone]   Or   Discharge Follow-Up Plan:   Clinical Follow-up (MH and/or SA Services):  Name of Provider Agency Referred: [facility] Date/Time of Appointment: Walk-in appt, Friday, 06/03/24 at 8:15am Phone Number: [phone] Address:  [address], [place], [place] [address]                      Reason: OUTPATIENT SERVICES   Transportation:  Family   **Please bring photo ID, social security card, insurance card, or proof of household income if they do not have insurance.**   Crisis Support Line: [phone]     Discharge Screening   Multiple Antipsychotics:  No  FDA-approved cessation medication prescribed at discharge: Not applicable Note: if patient is heavy user, (average volume of five or more cigarettes per day and/or cigars daily and/or  pipes daily during the past 30 days), the patient is required to have FDA-approved cessation medication (nicotine patch/gum, bupropion , varenicline etc),  or a documented reason for not prescribing it.  Labs/Imaging During Admission   Is the patient currently on an antipsychotic or will possibly be discharged on an antipsychotic?  No  Results for orders placed or performed during the hospital encounter of 05/29/24  Drug of Abuse 7 Panel  Result Value Ref Range   Amphetamines Screen, Urine Negative Negative   Barbiturates Screen, Urine Negative Negative   Benzodiazepines Screen, Urine Positive (A) Negative   Cocaine Screen, Urine Negative Negative   Opiates Screen, Urine Negative Negative   Fentanyl Screen, Urine Negative Negative   Marijuana (THC) Screen, Urine Negative Negative   Creatinine, Urine 239 >=20 mg/dL  Ethanol  Result Value Ref Range   Ethanol 13 (H) <10 mg/dL  Urinalysis with Reflex to Microscopic  Result Value Ref Range   Color, Urine Yellow Yellow   Clarity, Urine Clear Clear   Specific Gravity, Urine 1.027 (H) 1.005 - 1.025   pH, Urine 6.0 5.0 - 8.0   Protein, Urine 10 Negative, 10 , 20  mg/dL   Glucose, Urine Negative Negative, 30 , 50  mg/dL   Ketones, Urine Negative Negative, Trace mg/dL   Bilirubin, Urine Negative Negative   Blood, Urine Negative Negative, Trace   Nitrite, Urine Negative Negative   Leukocyte Esterase, Urine Negative Negative, 25   Urobilinogen, Urine Normal <2.0 mg/dL  Comprehensive Metabolic Panel  Result Value Ref Range  Sodium 138 136 - 145 mmol/L   Potassium 3.5 3.4 - 4.5 mmol/L   Chloride 103 98 - 107 mmol/L   CO2 27 21 - 31 mmol/L   Anion Gap 8 6 - 14 mmol/L   Glucose, Random 87 70 - 99 mg/dL   Blood Urea Nitrogen (BUN) 11 7 - 25 mg/dL   Creatinine 9.18 9.29 - 1.30 mg/dL   eGFR >09 >40 fO/fpw/8.26f7   Albumin 4.2 3.5 - 5.7 g/dL   Total Protein 7.0 6.4 - 8.9 g/dL   Bilirubin, Total 1.0 0.3 - 1.0 mg/dL   Alkaline Phosphatase  (ALP) 73 34 - 104 U/L   Aspartate Aminotransferase (AST) 21 13 - 39 U/L   Alanine Aminotransferase (ALT) 22 7 - 52 U/L   Calcium  8.4 (L) 8.6 - 10.3 mg/dL   BUN/Creatinine Ratio    CBC with Differential  Result Value Ref Range   WBC 6.88 4.40 - 11.00 10*3/uL   RBC 4.92 4.50 - 5.90 10*6/uL   Hemoglobin 16.1 14.0 - 17.5 g/dL   Hematocrit 54.4 58.4 - 50.4 %   Mean Corpuscular Volume (MCV) 92.5 80.0 - 96.0 fL   Mean Corpuscular Hemoglobin (MCH) 32.8 27.5 - 33.2 pg   Mean Corpuscular Hemoglobin Conc (MCHC) 35.4 33.0 - 37.0 g/dL   Red Cell Distribution Width (RDW) 14.4 12.3 - 17.0 %   Platelet Count (PLT) 275 150 - 450 10*3/uL   Mean Platelet Volume (MPV) 7.2 6.8 - 10.2 fL   Neutrophils % 75 %   Lymphocytes % 19 %   Monocytes % 6 %   Eosinophils % 1 %   Basophils % 0 %   Neutrophils Absolute 5.10 1.80 - 7.80 10*3/uL   Lymphocytes # 1.30 1.00 - 4.80 10*3/uL   Monocytes # 0.40 0.00 - 0.80 10*3/uL   Eosinophils # 0.00 0.00 - 0.50 10*3/uL   Basophils # 0.00 0.00 - 0.20 10*3/uL  Gamma-glutamyl Transferase (GGT)  Result Value Ref Range   GGT 25 9 - 64 U/L  Ammonia  Result Value Ref Range   Ammonia 53 18 - 72 umol/L  Magnesium   Result Value Ref Range   Magnesium  1.8 (L) 1.9 - 2.7 mg/dL  Morphology Review  Result Value Ref Range   RBC Morphology Reviewed and Unremarkable    WBC Morphology      Discharge Mental Status Examination   Mental Status Examination: General Appearance normal body habitus, casually dressed, and hygiene appropriate  General Behavior cooperative, pleasant, and appropriate eye contact  Psychomotor Activity normoactive  Gait and Station no gait abnormalities  Speech   fluent, normal volume, normal tone, normal prosody, and normal amount  Mood   Alright, Lethargic  Affect    euthymic  Thought Process linear/organized and goal directed  Associations intact  Thought Content/Perceptual Disturbances Denies suicidal/homicidal ideation and auditory/visual  hallucinations.  Cognition/Sensorium  orientation grossly intact, memory grossly intact, attention grossly intact, and language normal  Insight  fair  Judgment fair   Discharge Diagnoses   Psychiatric Diagnoses: Alcohol Use Disorder, Severe (F10.20) (Primary Diagnosis)   Medical Diagnoses: Medical History[1]  Psychosocial and contextual factors: other psychosocial or environmental problems  C-SSRS ([place] Suicide Severity Risk Scale and Protective Factors)   Since Last Asked Suicide Risk (Since Last Asked) Assessment - ongoing 1. Have you wished you were dead or wished you could go to sleep and not wake up? (Since Last Asked): No 2. Have you actually had any thoughts of killing yourself? (Since Last Asked): No 6.  Have you done anything, started to do anything, or prepared to do anything to end your life? (Since Last Asked): No Calculated C-SSRS Risk Score (Since Last Asked): No Risk Indicated  Patient Instructions     Discharge Medications     Medications To Continue      Sig Disp Refill Start End  hydrOXYzine  50 mg tablet Commonly known as: ATARAX   Take 1 tablet (50 mg total) by mouth every 8 (eight) hours as needed for anxiety (Option 1).  12 tablet  0         Stopped Medications    famotidine  20 mg tablet Commonly known as: PEPCID    sertraline  50 mg tablet Commonly known as: ZOLOFT         Jayson Anice Ohara, MD 06/02/2024 11:29 AM [facility] Department of Psychiatry & Behavioral Medicine          [1] Past Medical History: Diagnosis Date  . Addiction to drug    (CMD)   . Alcohol abuse   . Bipolar disorder    (CMD)   . Psychosis    (CMD)   . Withdrawal symptoms, alcohol    (CMD)

## 2024-06-02 NOTE — Care Plan (Signed)
  Problem: [facility] BH Drug/Alcohol/Tobacco Goal: Safe Detoxification (LTG) Outcome: Progressing Goal: Patient will demonstrate stabilized vital signs, diminished withdrawal and detoxification during this hospitalization (STG) Outcome: Progressing Goal: Patient will establish normal eating and sleeping patterns, take medications as prescribed and report as to the effectiveness during hospitalization (STG) Outcome: Progressing   Problem: [facility] BH Mood - Depression Goal: Patient will demonstrate non-dangerous behaviors by discharge (LTG) Outcome: Progressing Goal: Patient will comply with medication regimen (STG) Outcome: Progressing Goal: Patient will maintain an adequate nutritional status by specified date (STG) Outcome: Progressing Goal: Patient will avoid self-injurious behavior by specified date (STG) Outcome: Progressing Goal: Patient will participate in all Recreational Therapy groups and activities for specified number of days. (STG) Outcome: Progressing   Problem: [facility] BH Discharge Planning Goal: Patient will have adequate discharge plan for aftercare (LTG) Outcome: Progressing

## 2024-06-02 NOTE — Nursing Note (Signed)
 Patient discharged at 1215, patient has transportation on site, verbalized understanding of instructions, left with belongings, next Vivitrol  injection due Sept 3, 2025, injection prescription sent to [facility] for patient to pick up, patient denied SIU and withdrawal symptoms.

## 2024-07-10 ENCOUNTER — Other Ambulatory Visit (HOSPITAL_COMMUNITY)
Admission: EM | Admit: 2024-07-10 | Discharge: 2024-07-18 | Disposition: A | Discharge status: Home / Self Care | Payer: MEDICAID | Source: Intra-hospital | Attending: Psychiatry | Admitting: Psychiatry

## 2024-07-10 ENCOUNTER — Emergency Department (HOSPITAL_COMMUNITY)
Admission: EM | Admit: 2024-07-10 | Discharge: 2024-07-10 | Disposition: A | Discharge status: Other Acute Inpatient Hospital | Payer: MEDICAID | Attending: Emergency Medicine | Admitting: Emergency Medicine

## 2024-07-10 ENCOUNTER — Other Ambulatory Visit: Payer: Self-pay

## 2024-07-10 DIAGNOSIS — Y908 Blood alcohol level of 240 mg/100 ml or more: Secondary | ICD-10-CM | POA: Insufficient documentation

## 2024-07-10 DIAGNOSIS — F329 Major depressive disorder, single episode, unspecified: Secondary | ICD-10-CM | POA: Diagnosis present

## 2024-07-10 DIAGNOSIS — F332 Major depressive disorder, recurrent severe without psychotic features: Secondary | ICD-10-CM | POA: Insufficient documentation

## 2024-07-10 DIAGNOSIS — R45851 Suicidal ideations: Secondary | ICD-10-CM | POA: Insufficient documentation

## 2024-07-10 DIAGNOSIS — F1020 Alcohol dependence, uncomplicated: Secondary | ICD-10-CM | POA: Diagnosis present

## 2024-07-10 DIAGNOSIS — F1010 Alcohol abuse, uncomplicated: Secondary | ICD-10-CM | POA: Insufficient documentation

## 2024-07-10 DIAGNOSIS — Z79899 Other long term (current) drug therapy: Secondary | ICD-10-CM | POA: Insufficient documentation

## 2024-07-10 DIAGNOSIS — F419 Anxiety disorder, unspecified: Secondary | ICD-10-CM | POA: Insufficient documentation

## 2024-07-10 DIAGNOSIS — F1029 Alcohol dependence with unspecified alcohol-induced disorder: Secondary | ICD-10-CM | POA: Insufficient documentation

## 2024-07-10 DIAGNOSIS — F411 Generalized anxiety disorder: Secondary | ICD-10-CM | POA: Diagnosis present

## 2024-07-10 DIAGNOSIS — I10 Essential (primary) hypertension: Secondary | ICD-10-CM | POA: Insufficient documentation

## 2024-07-10 DIAGNOSIS — Z811 Family history of alcohol abuse and dependence: Secondary | ICD-10-CM | POA: Insufficient documentation

## 2024-07-10 DIAGNOSIS — F1090 Alcohol use, unspecified, uncomplicated: Secondary | ICD-10-CM

## 2024-07-10 LAB — COMPREHENSIVE METABOLIC PANEL WITH GFR
ALT: 33 U/L (ref 0–44)
AST: 29 U/L (ref 15–41)
Albumin: 4.4 g/dL (ref 3.5–5.0)
Alkaline Phosphatase: 54 U/L (ref 38–126)
Anion gap: 14 (ref 5–15)
BUN: 15 mg/dL (ref 6–20)
CO2: 23 mmol/L (ref 22–32)
Calcium: 8.2 mg/dL — ABNORMAL LOW (ref 8.9–10.3)
Chloride: 102 mmol/L (ref 98–111)
Creatinine, Ser: 0.8 mg/dL (ref 0.61–1.24)
GFR, Estimated: >60 mL/min (ref >60)
Glucose, Bld: 111 mg/dL — ABNORMAL HIGH (ref 70–99)
Potassium: 3.4 mmol/L — ABNORMAL LOW (ref 3.5–5.1)
Sodium: 139 mmol/L (ref 135–145)
Total Bilirubin: 0.7 mg/dL (ref 0.0–1.2)
Total Protein: 7.8 g/dL (ref 6.5–8.1)

## 2024-07-10 LAB — RAPID URINE DRUG SCREEN, HOSP PERFORMED
Amphetamines: NOT DETECTED
Barbiturates: NOT DETECTED
Benzodiazepines: NOT DETECTED
Cocaine: NOT DETECTED
Opiates: NOT DETECTED
Tetrahydrocannabinol: NOT DETECTED

## 2024-07-10 LAB — CBC
HCT: 46.7 % (ref 39.0–52.0)
Hemoglobin: 16.7 g/dL (ref 13.0–17.0)
MCH: 32.4 pg (ref 26.0–34.0)
MCHC: 35.8 g/dL (ref 30.0–36.0)
MCV: 90.5 fL (ref 80.0–100.0)
Platelets: 344 K/uL (ref 150–400)
RBC: 5.16 MIL/uL (ref 4.22–5.81)
RDW: 12.8 % (ref 11.5–15.5)
WBC: 8.4 K/uL (ref 4.0–10.5)
nRBC: 0 % (ref 0.0–0.2)

## 2024-07-10 LAB — ETHANOL: Alcohol, Ethyl (B): 356 mg/dL (ref <15)

## 2024-07-10 LAB — MAGNESIUM: Magnesium: 2.2 mg/dL (ref 1.7–2.4)

## 2024-07-10 MED ORDER — POTASSIUM CHLORIDE CRYS ER 20 MEQ PO TBCR
40.0000 meq | EXTENDED_RELEASE_TABLET | Freq: Once | ORAL | Status: AC
  Administered 2024-07-10: 40 meq via ORAL
  Filled 2024-07-10: qty 2

## 2024-07-10 MED ORDER — MAGNESIUM HYDROXIDE 400 MG/5ML PO SUSP
30.0000 mL | Freq: Every day | ORAL | Status: DC | PRN
  Administered 2024-07-16: 30 mL via ORAL
  Filled 2024-07-10: qty 30

## 2024-07-10 MED ORDER — CHLORDIAZEPOXIDE HCL 25 MG PO CAPS
50.0000 mg | ORAL_CAPSULE | Freq: Three times a day (TID) | ORAL | Status: DC
Start: 2024-07-10 — End: 2024-07-10
  Administered 2024-07-10: 50 mg via ORAL
  Filled 2024-07-10: qty 2

## 2024-07-10 MED ORDER — LORAZEPAM 2 MG/ML IJ SOLN: 1.0000 mg | INTRAMUSCULAR | Status: AC | PRN

## 2024-07-10 MED ORDER — ADULT MULTIVITAMIN W/MINERALS CH
1.0000 | ORAL_TABLET | Freq: Every day | ORAL | Status: DC
  Administered 2024-07-10: 1 via ORAL
  Filled 2024-07-10: qty 1

## 2024-07-10 MED ORDER — LACTATED RINGERS IV BOLUS
1000.0000 mL | Freq: Once | INTRAVENOUS | Status: AC
  Administered 2024-07-10: 1000 mL via INTRAVENOUS

## 2024-07-10 MED ORDER — FOLIC ACID 1 MG PO TABS
1.0000 mg | ORAL_TABLET | Freq: Every day | ORAL | Status: DC
  Administered 2024-07-11 – 2024-07-17 (×7): 1 mg via ORAL
  Filled 2024-07-10 (×7): qty 1

## 2024-07-10 MED ORDER — LORAZEPAM 2 MG/ML IJ SOLN: 2.0000 mg | Freq: Three times a day (TID) | INTRAMUSCULAR | Status: DC | PRN

## 2024-07-10 MED ORDER — CHLORDIAZEPOXIDE HCL 25 MG PO CAPS
50.0000 mg | ORAL_CAPSULE | Freq: Three times a day (TID) | ORAL | Status: DC
  Administered 2024-07-10: 50 mg via ORAL
  Filled 2024-07-10: qty 2

## 2024-07-10 MED ORDER — DIPHENHYDRAMINE HCL 50 MG/ML IJ SOLN: 50.0000 mg | Freq: Three times a day (TID) | INTRAMUSCULAR | Status: DC | PRN

## 2024-07-10 MED ORDER — HALOPERIDOL 5 MG PO TABS: 5.0000 mg | ORAL_TABLET | Freq: Three times a day (TID) | ORAL | Status: DC | PRN

## 2024-07-10 MED ORDER — DIPHENHYDRAMINE HCL 50 MG PO CAPS
50.0000 mg | ORAL_CAPSULE | Freq: Three times a day (TID) | ORAL | Status: DC | PRN
  Administered 2024-07-11: 50 mg via ORAL
  Filled 2024-07-10: qty 1

## 2024-07-10 MED ORDER — THIAMINE HCL 100 MG/ML IJ SOLN: 100.0000 mg | Freq: Every day | INTRAMUSCULAR | Status: DC

## 2024-07-10 MED ORDER — ALUM & MAG HYDROXIDE-SIMETH 200-200-20 MG/5ML PO SUSP: 30.0000 mL | ORAL | Status: DC | PRN

## 2024-07-10 MED ORDER — CHLORDIAZEPOXIDE HCL 25 MG PO CAPS
50.0000 mg | ORAL_CAPSULE | Freq: Once | ORAL | Status: AC
  Administered 2024-07-10: 50 mg via ORAL
  Filled 2024-07-10: qty 2

## 2024-07-10 MED ORDER — HALOPERIDOL LACTATE 5 MG/ML IJ SOLN: 5.0000 mg | Freq: Three times a day (TID) | INTRAMUSCULAR | Status: DC | PRN

## 2024-07-10 MED ORDER — LORAZEPAM 1 MG PO TABS
1.0000 mg | ORAL_TABLET | ORAL | Status: AC | PRN
  Administered 2024-07-10: 1 mg via ORAL
  Filled 2024-07-10: qty 1

## 2024-07-10 MED ORDER — ADULT MULTIVITAMIN W/MINERALS CH
1.0000 | ORAL_TABLET | Freq: Every day | ORAL | Status: DC
  Administered 2024-07-11 – 2024-07-17 (×7): 1 via ORAL
  Filled 2024-07-10 (×7): qty 1

## 2024-07-10 MED ORDER — THIAMINE MONONITRATE 100 MG PO TABS
100.0000 mg | ORAL_TABLET | Freq: Every day | ORAL | Status: DC
  Administered 2024-07-10: 100 mg via ORAL
  Filled 2024-07-10: qty 1

## 2024-07-10 MED ORDER — LORAZEPAM 1 MG PO TABS
1.0000 mg | ORAL_TABLET | Freq: Once | ORAL | Status: AC
  Administered 2024-07-10: 1 mg via ORAL
  Filled 2024-07-10: qty 1

## 2024-07-10 MED ORDER — LORAZEPAM 1 MG PO TABS
1.0000 mg | ORAL_TABLET | ORAL | Status: DC | PRN
  Administered 2024-07-10 (×3): 1 mg via ORAL
  Filled 2024-07-10 (×3): qty 1

## 2024-07-10 MED ORDER — ACETAMINOPHEN 325 MG PO TABS
650.0000 mg | ORAL_TABLET | Freq: Four times a day (QID) | ORAL | Status: DC | PRN
  Administered 2024-07-12 – 2024-07-17 (×4): 650 mg via ORAL
  Filled 2024-07-10 (×4): qty 2

## 2024-07-10 MED ORDER — HALOPERIDOL LACTATE 5 MG/ML IJ SOLN: 10.0000 mg | Freq: Three times a day (TID) | INTRAMUSCULAR | Status: DC | PRN

## 2024-07-10 MED ORDER — LORAZEPAM 2 MG/ML IJ SOLN: 1.0000 mg | INTRAMUSCULAR | Status: DC | PRN

## 2024-07-10 MED ORDER — THIAMINE MONONITRATE 100 MG PO TABS
100.0000 mg | ORAL_TABLET | Freq: Every day | ORAL | Status: DC
  Administered 2024-07-11 – 2024-07-17 (×7): 100 mg via ORAL
  Filled 2024-07-10 (×7): qty 1

## 2024-07-10 MED ORDER — ACETAMINOPHEN 325 MG PO TABS: 650.0000 mg | ORAL_TABLET | Freq: Four times a day (QID) | ORAL | Status: DC | PRN

## 2024-07-10 MED ORDER — FOLIC ACID 1 MG PO TABS
1.0000 mg | ORAL_TABLET | Freq: Every day | ORAL | Status: DC
  Administered 2024-07-10: 1 mg via ORAL
  Filled 2024-07-10: qty 1

## 2024-07-10 MED ORDER — THIAMINE HCL 100 MG/ML IJ SOLN
100.0000 mg | Freq: Every day | INTRAMUSCULAR | Status: DC
  Filled 2024-07-10 (×2): qty 2

## 2024-07-10 NOTE — Progress Notes (Signed)
 [facility] LCSW Progress Note   07/10/2024    1:49 PM  ELISA SORLIE   [phone]   Type of Contact and Topic:  Psychiatric Bed Placement   Pt accepted to [facility]    The attending provider will be Dr. Cole  Call report to [phone]  Lamarr Gambler, RN @ [facility] ED notified.     Pt scheduled  to arrive at [facility].    Mychal Decarlo, MSW, LCSW-A  1:51 PM 07/10/2024

## 2024-07-10 NOTE — ED Triage Notes (Signed)
 Pt endorses suicidal ideation s/t family issues that occurred yesterday. Has no plan. Endorses ETOH. Last use 1 hr ago, currently drinks 1/5th vodka per day. Reports concern for alcohol withdraw hx Dts.

## 2024-07-10 NOTE — ED Notes (Signed)
 Pt wanded by security.

## 2024-07-10 NOTE — Group Note (Signed)
 Group Topic: Emotional Regulation  Group Date: 07/10/2024 Start Time: 2000 End Time: 2100 Facilitators: Luvenia Mae SAUNDERS, NT  Department: [facility]  Number of Participants: 9  Group Focus: self-awareness Treatment Modality:  Leisure Development Interventions utilized were assignment Purpose: express feelings  Name: Trevor [name] Date of Birth: Aug 09, 1981  MR: [id number]    Level of Participation: active Quality of Participation: attentive Interactions with others: gave feedback Mood/Affect: appropriate Triggers (if applicable): none Cognition: coherent/clear Progress: Gaining insight Response: na Plan: patient will be encouraged to go to group  Patients Problems:  Patient Active Problem List   Diagnosis Date Noted   MDD (major depressive disorder) 07/10/2024   Moderate episode of recurrent major depressive disorder (HCC) 01/30/2024   GAD (generalized anxiety disorder) 01/30/2024   Transaminitis 06/18/2022   GERD (gastroesophageal reflux disease) 06/18/2022   Hypokalemia 06/18/2022   Chronic hepatitis C without hepatic coma (HCC) 06/28/2019   Psoriasis 06/07/2019   H/O withdrawal symptoms, alcohol, with delirium and seizures (HCC) 06/07/2019   Alcohol use disorder, severe, dependence (HCC) 06/03/2019   Obesity (BMI 30-39.9) 01/09/2016   HSV-2 (herpes simplex virus 2) infection

## 2024-07-10 NOTE — ED Notes (Signed)
 Patient is in the bedroom sleeping. NAD Will continue to monitor for safety

## 2024-07-10 NOTE — ED Provider Notes (Signed)
 Emergency Medicine Observation Re-evaluation Note  Trevor [name] is a 43 y.o. male, seen on rounds today.  Pt initially presented to the ED for complaints of V70.1 Currently, the patient is resting quietly.  Physical Exam  BP 128/84   Pulse (!) 104   Temp 98.2 F (36.8 C) (Oral)   Resp 15   SpO2 96%  Physical Exam General: No acute distress Cardiac: Mild tachycardia Lungs: Clear Psych: Calm  ED Course / MDM  EKG:   I have reviewed the labs performed to date as well as medications administered while in observation.  Recent changes in the last 24 hours include medical clearance and psychiatric evaluation.  Placed on CIWA  Plan  Current plan is for transfer to behavioral health urgent care for further evaluation.    Towana Ozell BROCKS, MD 07/10/24 [phone]

## 2024-07-10 NOTE — ED Notes (Signed)
 Patient is in the Dayroom calm and composed watching TV with other patients. NAD.  Environment secured per policy. Will monitor for safety.

## 2024-07-10 NOTE — ED Provider Notes (Signed)
 [place] EMERGENCY DEPARTMENT AT [facility] Provider Note   CSN: [id number] Arrival date & time: 07/10/24  9748     Patient presents with: V70.1   Trevor [name] is a 43 y.o. male.   HPI Patient presents for suicidal ideation.  Medical history includes alcohol abuse, depression, GERD.  He has had multiple ED visits this year for alcohol abuse.  Last month, he underwent Ativan  taper for alcohol cessation.  He was giving long-acting Vivitrol  injection.  Onset of suicidal thoughts was yesterday.  This is in the setting of increased alcohol abuse as well as psychosocial stressors from family situation.  He states that he had 2 bottles of vodka 2 days ago.  He drank 1 bottle of vodka yesterday.  He was drinking up until 1 hour prior to arrival.  Patient states that he does feel anxious and feels like he is already withdrawing.  He denies any attempts of self-harm.    Prior to Admission medications   Medication Sig Start Date End Date Taking? Authorizing Provider  hydrOXYzine  (ATARAX ) 25 MG tablet Take 1 tablet (25 mg total) by mouth every 6 (six) hours as needed for anxiety. 05/20/24   White, Patrice L, NP  Multiple Vitamin (MULTIVITAMIN WITH MINERALS) TABS tablet Take 1 tablet by mouth daily. 05/21/24   White, Patrice L, NP  sertraline  (ZOLOFT ) 100 MG tablet Take 1 tablet (100 mg total) by mouth at bedtime. 05/20/24   White, Patrice L, NP  thiamine  (VITAMIN B-1) 100 MG tablet Take 1 tablet (100 mg total) by mouth daily. 05/21/24   White, Patrice L, NP  valACYclovir  (VALTREX ) 500 MG tablet Take 500 mg by mouth daily as needed (For outbreaks). 04/08/24   [provider]    Allergies: Patient has no known allergies.    Review of Systems  Psychiatric/Behavioral:  Positive for suicidal ideas. The patient is nervous/anxious.   All other systems reviewed and are negative.   Updated Vital Signs BP (!) 150/108 (BP Location: Right Arm)   Pulse (!) 111   Temp 98.2 F (36.8  C) (Oral)   Resp 20   SpO2 97%   Physical Exam Vitals and nursing note reviewed.  Constitutional:      General: He is not in acute distress.    Appearance: Normal appearance. He is well-developed. He is not ill-appearing, toxic-appearing or diaphoretic.  HENT:     Head: Normocephalic and atraumatic.     Right Ear: External ear normal.     Left Ear: External ear normal.     Nose: Nose normal.     Mouth/Throat:     Mouth: Mucous membranes are moist.  Eyes:     Extraocular Movements: Extraocular movements intact.     Conjunctiva/sclera: Conjunctivae normal.  Cardiovascular:     Rate and Rhythm: Normal rate and regular rhythm.  Pulmonary:     Effort: Pulmonary effort is normal. No respiratory distress.  Abdominal:     General: There is no distension.     Palpations: Abdomen is soft.  Musculoskeletal:        General: No swelling. Normal range of motion.     Cervical back: Normal range of motion and neck supple.  Skin:    General: Skin is warm and dry.     Coloration: Skin is not jaundiced or pale.  Neurological:     General: No focal deficit present.     Mental Status: He is alert and oriented to person, place, and time.  Psychiatric:        Mood and Affect: Mood and affect normal.        Speech: Speech normal.        Behavior: Behavior normal. Behavior is cooperative.        Thought Content: Thought content includes suicidal ideation.     (all labs ordered are listed, but only abnormal results are displayed) Labs Reviewed  COMPREHENSIVE METABOLIC PANEL WITH GFR - Abnormal; Notable for the following components:      Result Value   Potassium 3.4 (*)    Glucose, Bld 111 (*)    Calcium  8.2 (*)    All other components within normal limits  CBC  MAGNESIUM   ETHANOL  RAPID URINE DRUG SCREEN, HOSP PERFORMED    EKG: None  Radiology: No results found.   Procedures   Medications Ordered in the ED  potassium chloride  SA (KLOR-CON  M) CR tablet 40 mEq (has no  administration in time range)  LORazepam  (ATIVAN ) tablet 1-4 mg (has no administration in time range)    Or  LORazepam  (ATIVAN ) injection 1-4 mg (has no administration in time range)  thiamine  (VITAMIN B1) tablet 100 mg (has no administration in time range)    Or  thiamine  (VITAMIN B1) injection 100 mg (has no administration in time range)  folic acid  (FOLVITE ) tablet 1 mg (has no administration in time range)  multivitamin with minerals tablet 1 tablet (has no administration in time range)  chlordiazePOXIDE  (LIBRIUM ) capsule 50 mg (has no administration in time range)  lactated ringers  bolus 1,000 mL (1,000 mLs Intravenous New Bag/Given 07/10/24 0326)  chlordiazePOXIDE  (LIBRIUM ) capsule 50 mg (50 mg Oral Given 07/10/24 0325)  LORazepam  (ATIVAN ) tablet 1 mg (1 mg Oral Given 07/10/24 0325)                                    Medical Decision Making Amount and/or Complexity of Data Reviewed Labs: ordered.  Risk OTC drugs. Prescription drug management.   Patient presenting for suicidal ideation in the setting of alcohol abuse and psychosocial stressors.  On arrival in the ED, vital signs notable for hypertension and tachycardia.  Patient states that, although he was drinking up until 1 hour prior to arrival, he feels like he is already withdrawing from alcohol.  Despite this, he is well-appearing on exam.  He is not tremulous at this time.  Librium  and Ativan  were ordered.  Medical clearance workup initiated.  Initial lab work notable for slight hypokalemia.  Replacement potassium was ordered.  Patient is medically cleared.  Patient to remain in the ED awaiting TTS evaluation.  CIWA protocol and continued Librium  were ordered.     Final diagnoses:  Suicidal ideation  Alcohol abuse    ED Discharge Orders     None          Melvenia Motto, MD 07/10/24 [phone]

## 2024-07-10 NOTE — BH Assessment (Addendum)
 Comprehensive Clinical Assessment (CCA) Note  07/10/2024 ONA RATHERT [id number]  Chief Complaint:  Chief Complaint  Patient presents with   V70.1   Visit Diagnosis: Suicidal ideation  Alcohol abuse      Disposition: Per Kathryne Show, NP patient is recommended for inpatient Psych admission. [facility] AC consulted to review and for bed availability. ED care team notified - Esbeyde Elene Buba and Bernardino Fireman, MD.    Chronic risk factors for suicide include: substance use disorder. Acute risk factors for suicide include: family or marital conflict, unemployment, social withdrawal/isolation, and loss (financial, interpersonal, professional). Protective factors for this patient include: positive social support, responsibility to others (children, family), and hope for the future. Considering these factors, the overall suicide risk at this point appears to be moderate. Patient is not appropriate for outpatient follow up.   Patient is a 43 year old male with a history of alcohol use disorder who presents voluntarily to [facility] ED for an assessment. Patient resides in the home with his mother and identifies her, his AA sponsor, Oneil, his friend Alm and his brother Bebe as their primary support system.Patient reports isolation, , irritability, hopelessness, guilt, loss of interest to do things they enjoy, fatigue, lack of concentration, worthlessness, change in sleep. Patient denied history of past suicide attempts.  Patient has a hx of Substance Abuse:  Last use was at midnight, pt drank 1/5 of vodka and smoked crack .Patient reports , SI, denied HI, AVH.  Patient identifies his primary stressors as unemployment and family issues.. Patient denies history of abuse or trauma. Patient denies current legal problems. Patient is not receiving outpatient therapy and psychiatry services.  Patient reports no longer taking medications as provider discontinued his Zoloft (see MAR) and denies recent medication  changes. Patient denies previous inpatient admission.  Patient reports access to weapons.Reports his father, Andron Marrazzo obtained weapon from his vehicle and locked it away [phone].    Pt is dressed in scrubs, alert, oriented x 4 with normal speech and restless motor behavior. Eye contact is fleeting and Pt is tearful at times in the assessment. Pt's mood is depressed and affect is  depressed. Thought process is coherent and relevant. Pt's insight is fair and judgement is impulsive There is no indication Pt is currently responding to internal stimuli or experiencing delusional thought content. Pt was cooperative throughout assessment. He says he is willing to sign voluntarily into a psychiatric facility.   Treatment options were discussed and patient is in agreement with recommendation for Inpatient psych hospitalization.      CCA Screening, Triage and Referral (STR)  Patient Reported Information How did you hear about us ? Self  What Is the Reason for Your Visit/Call Today? W. Klann is a 43 year old male who presented to [facility] ED VOL and unaccompanied. Pt reports he has a lot of family issues going on currently, including signing over his parental custody rights to his ex-wife and her new husband. Pt reports having a 22-year-old daughter and being unemployed. Pt reports he was drinking alcohol last night, became intoxicated and had he not been too intoxicated to get to his vehicle, would have used his gun that was in his car to complete suicide. Pt reports drinking alcohol 1/5 prior to admitting into the ED. Pt reports drinking approximately two fifths daily and smoking approximately 2 grams of crack daily. Pt denied AVH and HI.  How Long Has This Been Causing You Problems? > than 6 months  What Do You Feel Would Help You  the Most Today? Alcohol or Drug Use Treatment; Treatment for Depression or other mood problem; Stress Management; Medication(s)   Have You Recently Had Any Thoughts  About Hurting Yourself? Yes  Are You Planning to Commit Suicide/Harm Yourself At This time? No   Flowsheet Row ED from 07/10/2024 in [facility] Emergency Department at [facility] Most recent reading at 07/10/2024  3:07 AM ED from 05/13/2024 in [facility] Most recent reading at 05/13/2024  4:39 PM ED from 05/13/2024 in [facility] Most recent reading at 05/13/2024  3:20 PM  C-SSRS RISK CATEGORY Low Risk No Risk No Risk    Have you Recently Had Thoughts About Hurting Someone Sherral? No  Are You Planning to Harm Someone at This Time? No  Explanation: Pt endorses SI w/ plan to shoot self with gun. NO HI.   Have You Used Any Alcohol or Drugs in the Past 24 Hours? Yes  How Long Ago Did You Use Drugs or Alcohol? 1/5 at approximately midnight & 2 grams of crack weekly.  What Did You Use and How Much? alcohol & crack   Do You Currently Have a Therapist/Psychiatrist? No  Name of Therapist/Psychiatrist:    Have You Been Recently Discharged From Any Office Practice or Programs? No  Explanation of Discharge From Practice/Program: None    CCA Screening Triage Referral Assessment Type of Contact: Tele-Assessment  Telemedicine Service Delivery: Telemedicine service delivery: This service was provided via telemedicine using a 2-way, interactive audio and video technology  Is this Initial or Reassessment? Is this Initial or Reassessment?: Initial Assessment  Date Telepsych consult ordered in [facility]:  Date Telepsych consult ordered in [facility]: 07/10/24  Time Telepsych consult ordered in [facility]:  Time Telepsych consult ordered in [facility]: 0338  Location of Assessment: [facility] ED  Provider Location: [facility] Assessment Services   Collateral Involvement: None at this time   Does Patient Have a Automotive engineer Guardian? No  Legal Guardian Contact Information: None  Copy of Legal Guardianship Form: -- (n/a)  Legal Guardian Notified of  Arrival: -- (n/a)  Legal Guardian Notified of Pending Discharge: -- (n/a)  If Minor and Not Living with Parent(s), Who has Custody? n/a  Is CPS involved or ever been involved? Never  Is APS involved or ever been involved? Never   Patient Determined To Be At Risk for Harm To Self or Others Based on Review of Patient Reported Information or Presenting Complaint? Yes, for Self-Harm  Method: No Plan  Availability of Means: No access or NA  Intent: Vague intent or NA  Notification Required: No need or identified person  Additional Information for Danger to Others Potential: -- (n/a)  Additional Comments for Danger to Others Potential: None noted  Are There Guns or Other Weapons in Your Home? Yes  Types of Guns/Weapons: Gun  Are These Weapons Safely Secured?                            Yes (Per patient, father has obtained weapon and locked it away.)  Who Could Verify You Are Able To Have These Secured: Ihsan Nomura, Father [phone]  Do You Have any Outstanding Charges, Pending Court Dates, Parole/Probation? Pt denied  Contacted To Inform of Risk of Harm To Self or Others: -- (none)    Does Patient Present under Involuntary Commitment? No    [place] of Residence: [place]   Patient Currently Receiving the Following Services:  Not Receiving Services   Determination of Need: Emergent (2 hours)   Options For Referral: Chemical Dependency Intensive Outpatient Therapy (CDIOP); Facility-Based Crisis; Medication Management; Outpatient Therapy     CCA Biopsychosocial Patient Reported Schizophrenia/Schizoaffective Diagnosis in Past: No   Strengths: Patient is willing to participate in treatment   Mental Health Symptoms Depression:  Change in energy/activity; Difficulty Concentrating; Fatigue; Irritability; Hopelessness; Worthlessness; Sleep (too much or little); Increase/decrease in appetite   Duration of Depressive symptoms: Duration of Depressive Symptoms:  Greater than two weeks   Mania:  None   Anxiety:   Irritability; Difficulty concentrating; Worrying   Psychosis:  None   Duration of Psychotic symptoms:    Trauma:  None   Obsessions:  None   Compulsions:  None   Inattention:  None   Hyperactivity/Impulsivity:  None   Oppositional/Defiant Behaviors:  None   Emotional Irregularity:  None   Other Mood/Personality Symptoms:  None noted    Mental Status Exam Appearance and self-care  Stature:  Average   Weight:  Average weight   Clothing:  Casual   Grooming:  Normal   Cosmetic use:  None   Posture/gait:  Normal   Motor activity:  Restless   Sensorium  Attention:  Normal   Concentration:  Normal   Orientation:  X5   Recall/memory:  Normal   Affect and Mood  Affect:  Anxious; Depressed   Mood:  Depressed; Anxious   Relating  Eye contact:  Fleeting   Facial expression:  Responsive   Attitude toward examiner:  Cooperative   Thought and Language  Speech flow: Clear and Coherent   Thought content:  Appropriate to Mood and Circumstances   Preoccupation:  None   Hallucinations:  None   Organization:  Intact; Goal-directed   Company secretary of Knowledge:  Average   Intelligence:  Average   Abstraction:  Functional   Judgement:  Fair   Dance movement psychotherapist:  Adequate   Insight:  Poor   Decision Making:  Impulsive   Social Functioning  Social Maturity:  Impulsive   Social Judgement:  Naive   Stress  Stressors:  Work; Family conflict   Coping Ability:  Exhausted; Overwhelmed   Skill Deficits:  Decision making; Communication; Self-care; Self-control; Responsibility   Supports:  Family; Friends/Service system     Religion: Religion/Spirituality Are You A Religious Person?: Yes What is Your Religious Affiliation?: Christian How Might This Affect Treatment?: NA  Leisure/Recreation: Leisure / Recreation Do You Have Hobbies?: No  Exercise/Diet: Exercise/Diet Do You  Exercise?: Yes What Type of Exercise Do You Do?: Run/Walk How Many Times a Week Do You Exercise?: 1-3 times a week Have You Gained or Lost A Significant Amount of Weight in the Past Six Months?: No Do You Follow a Special Diet?: No Do You Have Any Trouble Sleeping?: Yes Explanation of Sleeping Difficulties: Pt states in the last week he has only been sleeping about 3 to 4 hours a night   CCA Employment/Education Employment/Work Situation: Employment / Work Situation Employment Situation: Unemployed Patient's Job has Been Impacted by Current Illness: Yes Describe how Patient's Job has Been Impacted: Pt states he lost more than one job due to his alcohol use Has Patient ever Been in the [employer]?: No  Education: Education Is Patient Currently Attending School?: No Last Grade Completed: 12 Did You Product manager?: No Did You Have An Individualized Education Program (IIEP): No Did You Have Any Difficulty At School?: No Patient's Education Has Been Impacted by Current  Illness: No   CCA Family/Childhood History Family and Relationship History: Family history Marital status: Divorced Divorced, when?: 2019 What types of issues is patient dealing with in the relationship?: Patient states alcohol, ruined his relationship Additional relationship information: None noted Does patient have children?: Yes How many children?: 1 How is patient's relationship with their children?: Strained Relationship. Recently gave up custody rights.  Childhood History:  Childhood History By whom was/is the patient raised?: Both parents Did patient suffer any verbal/emotional/physical/sexual abuse as a child?: No Did patient suffer from severe childhood neglect?: No Has patient ever been sexually abused/assaulted/raped as an adolescent or adult?: No Was the patient ever a victim of a crime or a disaster?: No Witnessed domestic violence?: No Has patient been affected by domestic violence as an  adult?: No       CCA Substance Use Alcohol/Drug Use: Alcohol / Drug Use Pain Medications: See MAR Prescriptions: See MAR Over the Counter: See MAR History of alcohol / drug use?: Yes Longest period of sobriety (when/how long): 6 months. Relapsed on July 4th Negative Consequences of Use: Financial, Armed forces operational officer, Personal relationships, Work / School Withdrawal Symptoms: Nausea / Vomiting, Sweats, Tremors, Tingling   Substance #2 Name of Substance 2: Alcohol ( vodka) 2 - Age of First Use: 12 2 - Amount (size/oz): wo fifths 2 - Frequency: daily 2 - Duration: 31 years 2 - Last Use / Amount: 1/5 2 - Method of Aquiring: oral 2 - Route of Substance Use: oral , store bought                     ASAM's:  Six Dimensions of Multidimensional Assessment  Dimension 1:  Acute Intoxication and/or Withdrawal Potential:   Dimension 1:  Description of individual's past and current experiences of substance use and withdrawal: past withdrawal sx: heavy tremors, cognitive issues  Dimension 2:  Biomedical Conditions and Complications:   Dimension 2:  Description of patient's biomedical conditions and  complications: none  Dimension 3:  Emotional, Behavioral, or Cognitive Conditions and Complications:  Dimension 3:  Description of emotional, behavioral, or cognitive conditions and complications: some depressive and anxiety sx  Dimension 4:  Readiness to Change:  Dimension 4:  Description of Readiness to Change criteria: reports he is absolutely reading to make long term changes  Dimension 5:  Relapse, Continued use, or Continued Problem Potential:  Dimension 5:  Relapse, continued use, or continued problem potential critiera description: began drinking at 12 with 6 months of sobriety until relapse  Dimension 6:  Recovery/Living Environment:  Dimension 6:  Recovery/Iiving environment criteria description: Pt lacks a supportive environment  ASAM Severity Score:    ASAM Recommended Level of Treatment:  ASAM Recommended Level of Treatment: Level I Outpatient Treatment   Substance use Disorder (SUD) Substance Use Disorder (SUD)  Checklist Symptoms of Substance Use: Continued use despite persistent or recurrent social, interpersonal problems, caused or exacerbated by use, Evidence of withdrawal (Comment) (tremmors and nausea. Receiving Ativan  in the ED.)  Recommendations for Services/Supports/Treatments: Recommendations for Services/Supports/Treatments Recommendations For Services/Supports/Treatments: Inpatient Hospitalization  Disposition Recommendation per psychiatric provider: We recommend inpatient psychiatric hospitalization when medically cleared. Patient is under voluntary admission status at this time; please IVC if attempts to leave hospital.   DSM5 Diagnoses: Patient Active Problem List   Diagnosis Date Noted   Moderate episode of recurrent major depressive disorder (HCC) 01/30/2024   GAD (generalized anxiety disorder) 01/30/2024   Transaminitis 06/18/2022   GERD (gastroesophageal reflux disease) 06/18/2022  Hypokalemia 06/18/2022   Chronic hepatitis C without hepatic coma (HCC) 06/28/2019   Psoriasis 06/07/2019   H/O withdrawal symptoms, alcohol, with delirium and seizures (HCC) 06/07/2019   Alcohol use disorder, severe, dependence (HCC) 06/03/2019   Obesity (BMI 30-39.9) 01/09/2016   HSV-2 (herpes simplex virus 2) infection      Referrals to Alternative Service(s): Referred to Alternative Service(s):   Place:   Date:   Time:    Referred to Alternative Service(s):   Place:   Date:   Time:    Referred to Alternative Service(s):   Place:   Date:   Time:    Referred to Alternative Service(s):   Place:   Date:   Time:     Malaky Tetrault, MSW, LCSW

## 2024-07-10 NOTE — ED Notes (Signed)
 Pt arrived to [facility] bed 155 from [facility] ED for ETOH detox and SI.  Pt reports drinking 2 pints of liquor daily with last drinking being midnight Pt noted to be tremulous, reporting anxiety, hot and cold sensations and slight nausea. Pt has received vivitrol  injection during recent hospitalization and the next dose was due 06/29/24   Pt denied current SI plan and intent,  Denied HI and A/V hallucinations.  Pt is on Librium  taper and is being monitored b CIWA protocol Cooperative with admission process Q  15 minute observations for safety in place

## 2024-07-10 NOTE — ED Provider Notes (Signed)
 Facility Based Crisis Admission H&P  Date: 07/10/24 Patient Name: Trevor [name] MRN: [id number] Chief Complaint: I am looking for a long term rehab  Diagnoses:  Final diagnoses:  Alcohol use disorder    HPI: Trevor [name] is a 43 year-old male who is admitted to [facility] following his visit to [facility] ED where he he presented with chief complain of alcohol use  and suicidal ideations. He presents with a hx of multiple admissions and treatments for alcohol abuse. On 06/02/2024, patient was discharged from [facility] when he completed detox and was recommended to keep up with outpatient services.  On 05/13/2024, patient was discharged from [facility] when he completed detox program. Patient reports that he relapsed on alcohol after 4 months of sobriety. He remained sober upon completion of treatment at [facility]. He went to a half-way house and met a male friend there and we relapsed together. He has not been able to remain sober since. Patient reports that this admission was precipitated by a family situation: his ex-mother-in-law contacted him, telling him that his 87 year-old daughter is starting to question about him, his addiction. Patient felt remorse and was encouraged by his mother to seek help again. He reports that he is now looking for a rehab place that can keep him longer than just 21 days.   Patient presents with a family hx of alcohol abuse. States his grandparents had alcohol problems. Brother has been recovered for 4 years.  He his alcohol of choice is liquor, and he states when I start, I can't stop. 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 States he sometimes  passes out.   Patient states he lives with his mother and she is supportive. Reports he was prescribed Sertraline  but would prefer Wellbutrin .   Assessment: Patient is evaluated face-to-face by this provider and chart is reviewed on 07/10/2024. Vital signs WNL. He is sitting in his room  and receptive upon approach. Alert and oriented x 4. Calm and cooperative. He is dressed in hospital scrubs and appears healthy. Hygiene is decent. He does not appear to be preoccupied or responding to internal stimuli. Has good eye contact. Speech is coherent and well articulated. His thought process is goal-directed  Patient remains focused throughout this evaluation.  Denies SI/HI/AVH but admits that he was feeling like giving up prior to going to the hospital. Patient admits to having alcohol problem and willing to complete detox and go to a long-term treatment program. States he is often triggered by being around people who use. States he is doing this for his daughter who is now starting to question about him.  Patient admits to using Cocaine occasionally but no additional drugs. Reports feeling guilty and depressed. He denies current medical/health concerns. States he is not taking any medications but wants to discuss about Wellbutrin .  No acute withdrawal symptoms noted but mild tremors.   Patient expresses motivation for this treatment, and reports he is looking into getting in a long-term program. Detox protocol was initiated. Treatment plan is discussed with patient and he verbalizes understanding. Support and encouragements provided. Patient is encouraged to discuss after care plan with case management in AM.   PHQ 2-9:  Flowsheet Row ED from 07/10/2024 in [facility] ED from 05/13/2024 in [facility] ED from 01/23/2024 in [facility]  Thoughts that you would be better off dead, or of hurting yourself in some way  Not at all Several days Not at all  PHQ-9 Total Score 7 9 14     Flowsheet Row ED from 07/10/2024 in [facility] Most recent reading at 07/10/2024  4:50 PM ED from 07/10/2024 in [facility] Emergency Department at [facility] Most recent reading at 07/10/2024  3:07 AM  ED from 05/13/2024 in [facility] Most recent reading at 05/13/2024  4:39 PM  C-SSRS RISK CATEGORY No Risk Low Risk No Risk      Total Time spent with patient: 1 hour  Musculoskeletal  Strength & Muscle Tone: within normal limits Gait & Station: normal Patient leans: N/A  Psychiatric Specialty Exam  Presentation General Appearance:  Casual  Eye Contact: Fair  Speech: Clear and Coherent  Speech Volume: Normal  Handedness: Right   Mood and Affect  Mood: Anxious; Depressed  Affect: Depressed; Flat   Thought Process  Thought Processes: Coherent  Descriptions of Associations:Intact  Orientation:Full (Time, Place and Person)  Thought Content:WDL  Diagnosis of Schizophrenia or Schizoaffective disorder in past: No   Hallucinations:Hallucinations: None  Ideas of Reference:None  Suicidal Thoughts:Suicidal Thoughts: No  Homicidal Thoughts:Homicidal Thoughts: No   Sensorium  Memory: Immediate Fair; Recent Fair; Remote Fair  Judgment: Fair  Insight: Fair   Art therapist  Concentration: Fair  Attention Span: Fair  Recall: [employer] of Knowledge: Fair  Language: Fair   Psychomotor Activity  Psychomotor Activity: Psychomotor Activity: Restlessness   Assets  Assets: Communication Skills; Desire for Improvement; Physical Health   Sleep  Sleep: Sleep: Fair Number of Hours of Sleep: 4   Nutritional Assessment (For OBS and FBC admissions only) Has the patient had a weight loss or gain of 10 pounds or more in the last 3 months?: No Has the patient had a decrease in food intake/or appetite?: No Does the patient have dental problems?: No Does the patient have eating habits or behaviors that may be indicators of an eating disorder including binging or inducing vomiting?: No Has the patient recently lost weight without trying?: 0 Has the patient been eating poorly because of a decreased appetite?:  0 Malnutrition Screening Tool Score: 0    Physical Exam Vitals and nursing note reviewed.  Constitutional:      Appearance: Normal appearance.  HENT:     Head: Normocephalic and atraumatic.     Right Ear: Tympanic membrane normal.     Left Ear: Tympanic membrane normal.     Nose: Nose normal.  Eyes:     Extraocular Movements: Extraocular movements intact.     Pupils: Pupils are equal, round, and reactive to light.  Cardiovascular:     Rate and Rhythm: Normal rate.     Pulses: Normal pulses.  Pulmonary:     Effort: Pulmonary effort is normal.  Musculoskeletal:        General: Normal range of motion.     Cervical back: Normal range of motion and neck supple.  Neurological:     General: No focal deficit present.     Mental Status: He is alert and oriented to person, place, and time.    Review of Systems  Constitutional: Negative.   HENT: Negative.    Eyes: Negative.   Respiratory: Negative.    Cardiovascular: Negative.   Gastrointestinal: Negative.   Genitourinary: Negative.   Musculoskeletal: Negative.   Skin: Negative.   Neurological: Negative.   Endo/Heme/Allergies: Negative.   Psychiatric/Behavioral:  Positive for depression and substance abuse.  Blood pressure 121/78, pulse 78, temperature 98.9 F (37.2 C), temperature source Oral, resp. rate 18, SpO2 98%. There is no height or weight on file to calculate BMI.  Past Psychiatric History: Alcohol use disorder   Is the patient at risk to self? No  Has the patient been a risk to self in the past 6 months? Yes .    Has the patient been a risk to self within the distant past? No   Is the patient a risk to others? No   Has the patient been a risk to others in the past 6 months? No   Has the patient been a risk to others within the distant past? No   Past Medical History: Hep C,  Family History: Grandparents had alcohol problems. Brother is recovered from alcohol Social History: Divorced. Lives with his mother.  Has a 32 year-old daughter.   Last Labs:  Admission on 07/10/2024, Discharged on 07/10/2024  Component Date Value Ref Range Status   Sodium 07/10/2024 139  135 - 145 mmol/L Final   Potassium 07/10/2024 3.4 (L)  3.5 - 5.1 mmol/L Final   Chloride 07/10/2024 102  98 - 111 mmol/L Final   CO2 07/10/2024 23  22 - 32 mmol/L Final   Glucose, Bld 07/10/2024 111 (H)  70 - 99 mg/dL Final   Glucose reference range applies only to samples taken after fasting for at least 8 hours.   BUN 07/10/2024 15  6 - 20 mg/dL Final   Creatinine, Ser 07/10/2024 0.80  0.61 - 1.24 mg/dL Final   Calcium  07/10/2024 8.2 (L)  8.9 - 10.3 mg/dL Final   Total Protein 90/85/7974 7.8  6.5 - 8.1 g/dL Final   Albumin 90/85/7974 4.4  3.5 - 5.0 g/dL Final   AST 90/85/7974 29  15 - 41 U/L Final   ALT 07/10/2024 33  0 - 44 U/L Final   Alkaline Phosphatase 07/10/2024 54  38 - 126 U/L Final   Total Bilirubin 07/10/2024 0.7  0.0 - 1.2 mg/dL Final   GFR, Estimated 07/10/2024 >60  >60 mL/min Final   Comment: (NOTE) Calculated using the CKD-EPI Creatinine Equation (2021)    Anion gap 07/10/2024 14  5 - 15 Final   Performed at [facility], [address], [place], [place] [address]   Alcohol, Ethyl (B) 07/10/2024 356 (HH)  <15 mg/dL Final   Comment: CRITICAL RESULT CALLED TO, READ BACK BY AND VERIFIED WITH PRUITT,G ON 07/10/24 AT 0352 BY PURDIE,J (NOTE) For medical purposes only. Performed at [facility], [address], [place], [place] [address]    WBC 07/10/2024 8.4  4.0 - 10.5 K/uL Final   RBC 07/10/2024 5.16  4.22 - 5.81 MIL/uL Final   Hemoglobin 07/10/2024 16.7  13.0 - 17.0 g/dL Final   HCT 90/85/7974 46.7  39.0 - 52.0 % Final   MCV 07/10/2024 90.5  80.0 - 100.0 fL Final   MCH 07/10/2024 32.4  26.0 - 34.0 pg Final   MCHC 07/10/2024 35.8  30.0 - 36.0 g/dL Final   RDW 90/85/7974 12.8  11.5 - 15.5 % Final   Platelets 07/10/2024 344  150 - 400 K/uL Final   nRBC 07/10/2024 0.0  0.0 - 0.2 % Final   Performed at [facility], [address], [place], [place] [address]   Opiates 07/10/2024 NONE DETECTED  NONE DETECTED Final   Cocaine 07/10/2024 NONE DETECTED  NONE DETECTED Final   Benzodiazepines 07/10/2024 NONE DETECTED  NONE DETECTED Final   Amphetamines  07/10/2024 NONE DETECTED  NONE DETECTED Final   Tetrahydrocannabinol 07/10/2024 NONE DETECTED  NONE DETECTED Final   Barbiturates 07/10/2024 NONE DETECTED  NONE DETECTED Final   Comment: (NOTE) DRUG SCREEN FOR MEDICAL PURPOSES ONLY.  IF CONFIRMATION IS NEEDED FOR ANY PURPOSE, NOTIFY LAB WITHIN 5 DAYS.  LOWEST DETECTABLE LIMITS FOR URINE DRUG SCREEN Drug Class                     Cutoff (ng/mL) Amphetamine and metabolites    1000 Barbiturate and metabolites    200 Benzodiazepine                 200 Opiates and metabolites        300 Cocaine and metabolites        300 THC                            50 Performed at [facility], [address], [place], [place] [address]    Magnesium  07/10/2024 2.2  1.7 - 2.4 mg/dL Final   Performed at [facility], [address], [place], [place] [address]  Admission on 05/13/2024, Discharged on 05/20/2024  Component Date Value Ref Range Status   Hgb A1c MFr Bld 05/15/2024 4.8  4.8 - 5.6 % Final   Comment: (NOTE) Diagnosis of Diabetes The following HbA1c ranges recommended by the American Diabetes Association (ADA) may be used as an aid in the diagnosis of diabetes mellitus.  Hemoglobin             Suggested A1C NGSP%              Diagnosis  <5.7                   Non Diabetic  5.7-6.4                Pre-Diabetic  >6.4                   Diabetic  <7.0                   Glycemic control for                       adults with diabetes.     Mean Plasma Glucose 05/15/2024 91.06  mg/dL Final   Performed at [facility], [address], [place], [place] [address]   Cholesterol 05/15/2024 187  0 - 200 mg/dL Final   Triglycerides 92/79/7974 186 (H)  <150 mg/dL Final   HDL 92/79/7974 59  >40 mg/dL Final    Total CHOL/HDL Ratio 05/15/2024 3.2  RATIO Final   VLDL 05/15/2024 37  0 - 40 mg/dL Final   LDL Cholesterol 05/15/2024 91  0 - 99 mg/dL Final   Comment:        Total Cholesterol/HDL:CHD Risk Coronary Heart Disease Risk Table                     Men   Women  1/2 Average Risk   3.4   3.3  Average Risk       5.0   4.4  2 X Average Risk   9.6   7.1  3 X Average Risk  23.4   11.0        Use the calculated Patient Ratio above and the CHD Risk Table to determine the patient's CHD Risk.  ATP III CLASSIFICATION (LDL):  <100     mg/dL   Optimal  899-870  mg/dL   Near or Above                    Optimal  130-159  mg/dL   Borderline  839-810  mg/dL   High  >809     mg/dL   Very High Performed at [facility], [address], [place], [place] [address]    Vit D, 25-Hydroxy 05/15/2024 78.38  30 - 100 ng/mL Final   Comment: (NOTE) Vitamin D  deficiency has been defined by the Institute of Medicine  and an Endocrine Society practice guideline as a level of serum 25-OH  vitamin D  less than 20 ng/mL (1,2). The Endocrine Society went on to  further define vitamin D  insufficiency as a level between 21 and 29  ng/mL (2).  1. IOM (Institute of Medicine). 2010. Dietary reference intakes for  calcium  and D. [place]: [employer]. 2. Holick MF, Binkley [place], Bischoff-Ferrari HA, et al. Evaluation,  treatment, and prevention of vitamin D  deficiency: an Endocrine  Society clinical practice guideline, JCEM. 2011 Jul; 96(7): 1911-30.  Performed at [facility], [address], [place], [place] [address]    Vitamin B-12 05/15/2024 265  180 - 914 pg/mL Final   Comment: (NOTE) This assay is not validated for testing neonatal or myeloproliferative syndrome specimens for Vitamin B12 levels. Performed at [facility], [address] [place], [place] [address]    Magnesium  05/15/2024 1.9  1.7 - 2.4 mg/dL Final   Performed at [facility], [address], [place], [place] [address]   TSH 05/15/2024 1.255  0.350 - 4.500 uIU/mL Final   Comment: Performed by a 3rd Generation assay with a functional sensitivity of <=0.01 uIU/mL. Performed at [facility], [address], [place], [place] [address]   Admission on 05/13/2024, Discharged on 05/13/2024  Component Date Value Ref Range Status   WBC 05/13/2024 8.3  4.0 - 10.5 K/uL Final   RBC 05/13/2024 5.19  4.22 - 5.81 MIL/uL Final   Hemoglobin 05/13/2024 16.7  13.0 - 17.0 g/dL Final   HCT 92/81/7974 48.0  39.0 - 52.0 % Final   MCV 05/13/2024 92.5  80.0 - 100.0 fL Final   MCH 05/13/2024 32.2  26.0 - 34.0 pg Final   MCHC 05/13/2024 34.8  30.0 - 36.0 g/dL Final   RDW 92/81/7974 13.5  11.5 - 15.5 % Final   Platelets 05/13/2024 329  150 - 400 K/uL Final   nRBC 05/13/2024 0.0  0.0 - 0.2 % Final   Neutrophils Relative % 05/13/2024 80  % Final   Neutro Abs 05/13/2024 6.6  1.7 - 7.7 K/uL Final   Lymphocytes Relative 05/13/2024 14  % Final   Lymphs Abs 05/13/2024 1.2  0.7 - 4.0 K/uL Final   Monocytes Relative 05/13/2024 5  % Final   Monocytes Absolute 05/13/2024 0.4  0.1 - 1.0 K/uL Final   Eosinophils Relative 05/13/2024 0  % Final   Eosinophils Absolute 05/13/2024 0.0  0.0 - 0.5 K/uL Final   Basophils Relative 05/13/2024 0  % Final   Basophils Absolute 05/13/2024 0.0  0.0 - 0.1 K/uL Final   Immature Granulocytes 05/13/2024 1  % Final   Abs Immature Granulocytes 05/13/2024 0.04  0.00 - 0.07 K/uL Final   Performed at [facility], [address], [place], [place] [address]  Sodium 05/13/2024 138  135 - 145 mmol/L Final   Potassium 05/13/2024 3.9  3.5 - 5.1 mmol/L Final   Chloride 05/13/2024 97 (L)  98 - 111 mmol/L Final   CO2 05/13/2024 30  22 - 32 mmol/L Final   Glucose, Bld 05/13/2024 103 (H)  70 - 99 mg/dL Final   Glucose reference range applies only to samples taken after fasting for at least 8 hours.   BUN 05/13/2024 8  6 - 20 mg/dL Final   Creatinine, Ser 05/13/2024 0.82  0.61  - 1.24 mg/dL Final   Calcium  05/13/2024 9.0  8.9 - 10.3 mg/dL Final   Total Protein 92/81/7974 7.1  6.5 - 8.1 g/dL Final   Albumin 92/81/7974 4.1  3.5 - 5.0 g/dL Final   AST 92/81/7974 23  15 - 41 U/L Final   ALT 05/13/2024 21  0 - 44 U/L Final   Alkaline Phosphatase 05/13/2024 80  38 - 126 U/L Final   Total Bilirubin 05/13/2024 0.8  0.0 - 1.2 mg/dL Final   GFR, Estimated 05/13/2024 >60  >60 mL/min Final   Comment: (NOTE) Calculated using the CKD-EPI Creatinine Equation (2021)    Anion gap 05/13/2024 11  5 - 15 Final   Performed at [facility], [address] [place], [place] [address]   Magnesium  05/13/2024 1.7  1.7 - 2.4 mg/dL Final   Performed at [facility], [address], [place], [place] [address]   Alcohol, Ethyl (B) 05/13/2024 52 (H)  <15 mg/dL Final   Comment: (NOTE) For medical purposes only. Performed at [facility], [address] [place], [place] [address]    POC Amphetamine UR 05/13/2024 None Detected  NONE DETECTED (Cut Off Level 1000 ng/mL) Final   POC Secobarbital (BAR) 05/13/2024 None Detected  NONE DETECTED (Cut Off Level 300 ng/mL) Final   POC Buprenorphine (BUP) 05/13/2024 None Detected  NONE DETECTED (Cut Off Level 10 ng/mL) Final   POC Oxazepam (BZO) 05/13/2024 None Detected  NONE DETECTED (Cut Off Level 300 ng/mL) Final   POC Cocaine UR 05/13/2024 None Detected  NONE DETECTED (Cut Off Level 300 ng/mL) Final   POC Methamphetamine UR 05/13/2024 None Detected  NONE DETECTED (Cut Off Level 1000 ng/mL) Final   POC Morphine  05/13/2024 None Detected  NONE DETECTED (Cut Off Level 300 ng/mL) Final   POC Methadone UR 05/13/2024 None Detected  NONE DETECTED (Cut Off Level 300 ng/mL) Final   POC Oxycodone  UR 05/13/2024 None Detected  NONE DETECTED (Cut Off Level 100 ng/mL) Final   POC Marijuana UR 05/13/2024 None Detected  NONE DETECTED (Cut Off Level 50 ng/mL) Final  Admission on 01/23/2024, Discharged on 02/02/2024  Component Date Value Ref Range  Status   Sodium 01/30/2024 137  135 - 145 mmol/L Final   Potassium 01/30/2024 4.1  3.5 - 5.1 mmol/L Final   Chloride 01/30/2024 101  98 - 111 mmol/L Final   CO2 01/30/2024 30  22 - 32 mmol/L Final   Glucose, Bld 01/30/2024 92  70 - 99 mg/dL Final   Glucose reference range applies only to samples taken after fasting for at least 8 hours.   BUN 01/30/2024 17  6 - 20 mg/dL Final   Creatinine, Ser 01/30/2024 1.03  0.61 - 1.24 mg/dL Final   Calcium  01/30/2024 8.9  8.9 - 10.3 mg/dL Final   Total Protein 95/94/7974 7.2  6.5 - 8.1 g/dL Final   Albumin 95/94/7974 4.1  3.5 - 5.0 g/dL Final  AST 01/30/2024 18  15 - 41 U/L Final   ALT 01/30/2024 19  0 - 44 U/L Final   Alkaline Phosphatase 01/30/2024 43  38 - 126 U/L Final   Total Bilirubin 01/30/2024 0.6  0.0 - 1.2 mg/dL Final   GFR, Estimated 01/30/2024 >60  >60 mL/min Final   Comment: (NOTE) Calculated using the CKD-EPI Creatinine Equation (2021)    Anion gap 01/30/2024 6  5 - 15 Final   Performed at [facility], [address], [place], [place] [address]  Admission on 01/22/2024, Discharged on 01/23/2024  Component Date Value Ref Range Status   WBC 01/22/2024 6.8  4.0 - 10.5 K/uL Final   RBC 01/22/2024 5.22  4.22 - 5.81 MIL/uL Final   Hemoglobin 01/22/2024 17.2 (H)  13.0 - 17.0 g/dL Final   HCT 96/71/7974 47.8  39.0 - 52.0 % Final   MCV 01/22/2024 91.6  80.0 - 100.0 fL Final   MCH 01/22/2024 33.0  26.0 - 34.0 pg Final   MCHC 01/22/2024 36.0  30.0 - 36.0 g/dL Final   RDW 96/71/7974 12.1  11.5 - 15.5 % Final   Platelets 01/22/2024 319  150 - 400 K/uL Final   nRBC 01/22/2024 0.0  0.0 - 0.2 % Final   Neutrophils Relative % 01/22/2024 70  % Final   Neutro Abs 01/22/2024 4.8  1.7 - 7.7 K/uL Final   Lymphocytes Relative 01/22/2024 25  % Final   Lymphs Abs 01/22/2024 1.7  0.7 - 4.0 K/uL Final   Monocytes Relative 01/22/2024 4  % Final   Monocytes Absolute 01/22/2024 0.3  0.1 - 1.0 K/uL Final   Eosinophils Relative 01/22/2024 0  % Final    Eosinophils Absolute 01/22/2024 0.0  0.0 - 0.5 K/uL Final   Basophils Relative 01/22/2024 1  % Final   Basophils Absolute 01/22/2024 0.1  0.0 - 0.1 K/uL Final   Immature Granulocytes 01/22/2024 0  % Final   Abs Immature Granulocytes 01/22/2024 0.02  0.00 - 0.07 K/uL Final   Performed at [facility], [address] [place], [place] [address]   Sodium 01/22/2024 138  135 - 145 mmol/L Final   Potassium 01/22/2024 3.9  3.5 - 5.1 mmol/L Final   Chloride 01/22/2024 99  98 - 111 mmol/L Final   CO2 01/22/2024 25  22 - 32 mmol/L Final   Glucose, Bld 01/22/2024 99  70 - 99 mg/dL Final   Glucose reference range applies only to samples taken after fasting for at least 8 hours.   BUN 01/22/2024 13  6 - 20 mg/dL Final   Creatinine, Ser 01/22/2024 0.83  0.61 - 1.24 mg/dL Final   Calcium  01/22/2024 8.9  8.9 - 10.3 mg/dL Final   Total Protein 96/71/7974 7.5  6.5 - 8.1 g/dL Final   Albumin 96/71/7974 4.6  3.5 - 5.0 g/dL Final   AST 96/71/7974 44 (H)  15 - 41 U/L Final   ALT 01/22/2024 46 (H)  0 - 44 U/L Final   Alkaline Phosphatase 01/22/2024 60  38 - 126 U/L Final   Total Bilirubin 01/22/2024 0.8  0.0 - 1.2 mg/dL Final   GFR, Estimated 01/22/2024 >60  >60 mL/min Final   Comment: (NOTE) Calculated using the CKD-EPI Creatinine Equation (2021)    Anion gap 01/22/2024 14  5 - 15 Final   Performed at [facility], [address], [place], [place] [address]   Hgb A1c MFr Bld 01/22/2024 5.0  4.8 - 5.6 %  Final   Comment: (NOTE) Pre diabetes:          5.7%-6.4%  Diabetes:              >6.4%  Glycemic control for   <7.0% adults with diabetes    Mean Plasma Glucose 01/22/2024 96.8  mg/dL Final   Performed at [facility], [address], [place], [place] [address]   Magnesium  01/22/2024 1.9  1.7 - 2.4 mg/dL Final   Performed at [facility], [address] [place], [place] [address]   Alcohol, Ethyl (B) 01/22/2024 109 (H)  <10 mg/dL Final   Comment: (NOTE) Lowest  detectable limit for serum alcohol is 10 mg/dL.  For medical purposes only. Performed at [facility], [address] [place], [place] [address]    Cholesterol 01/22/2024 202 (H)  0 - 200 mg/dL Final   Triglycerides 96/71/7974 65  <150 mg/dL Final   HDL 96/71/7974 84  >40 mg/dL Final   Total CHOL/HDL Ratio 01/22/2024 2.4  RATIO Final   VLDL 01/22/2024 13  0 - 40 mg/dL Final   LDL Cholesterol 01/22/2024 105 (H)  0 - 99 mg/dL Final   Comment:        Total Cholesterol/HDL:CHD Risk Coronary Heart Disease Risk Table                     Men   Women  1/2 Average Risk   3.4   3.3  Average Risk       5.0   4.4  2 X Average Risk   9.6   7.1  3 X Average Risk  23.4   11.0        Use the calculated Patient Ratio above and the CHD Risk Table to determine the patient's CHD Risk.        ATP III CLASSIFICATION (LDL):  <100     mg/dL   Optimal  899-870  mg/dL   Near or Above                    Optimal  130-159  mg/dL   Borderline  839-810  mg/dL   High  >809     mg/dL   Very High Performed at [facility], [address] [place], [place] [address]    TSH 01/22/2024 0.458  0.350 - 4.500 uIU/mL Final   Comment: Performed by a 3rd Generation assay with a functional sensitivity of <=0.01 uIU/mL. Performed at [facility], [address] [place], [place] [address]    RPR Ser Ql 01/22/2024 NON REACTIVE  NON REACTIVE Final   Performed at [facility], [address] [place], [place] [address]   Color, Urine 01/22/2024 YELLOW  YELLOW Final   APPearance 01/22/2024 CLEAR  CLEAR Final   Specific Gravity, Urine 01/22/2024 1.020  1.005 - 1.030 Final   pH 01/22/2024 5.0  5.0 - 8.0 Final   Glucose, UA 01/22/2024 NEGATIVE  NEGATIVE mg/dL Final   Hgb urine dipstick 01/22/2024 NEGATIVE  NEGATIVE Final   Bilirubin Urine 01/22/2024 NEGATIVE  NEGATIVE Final   Ketones, ur 01/22/2024 5 (A)  NEGATIVE mg/dL Final   Protein, ur 96/71/7974 NEGATIVE  NEGATIVE mg/dL Final   Nitrite  96/71/7974 NEGATIVE  NEGATIVE Final   Leukocytes,Ua 01/22/2024 NEGATIVE  NEGATIVE Final   Performed at [facility], [address], [place], [place] [address]   POC Amphetamine UR 01/22/2024 None Detected  Final   POC Secobarbital (BAR) 01/22/2024 None Detected   Final   POC Buprenorphine (BUP) 01/22/2024 None Detected   Final   POC Oxazepam (BZO) 01/22/2024 None Detected   Final   POC Cocaine UR 01/22/2024 None Detected   Final   POC Methamphetamine UR 01/22/2024 None Detected   Final   POC Morphine  01/22/2024 None Detected   Final   POC Methadone UR 01/22/2024 None Detected   Final   POC Oxycodone  UR 01/22/2024 None Detected   Final   POC Marijuana UR 01/22/2024 None Detected   Final    Allergies: Patient has no known allergies.  Medications:  Facility Ordered Medications  Medication   [COMPLETED] lactated ringers  bolus 1,000 mL   [COMPLETED] chlordiazePOXIDE  (LIBRIUM ) capsule 50 mg   [COMPLETED] LORazepam  (ATIVAN ) tablet 1 mg   [COMPLETED] potassium chloride  SA (KLOR-CON  M) CR tablet 40 mEq   chlordiazePOXIDE  (LIBRIUM ) capsule 50 mg   [START ON 07/11/2024] folic acid  (FOLVITE ) tablet 1 mg   LORazepam  (ATIVAN ) tablet 1-4 mg   Or   LORazepam  (ATIVAN ) injection 1-4 mg   [START ON 07/11/2024] multivitamin with minerals tablet 1 tablet   [START ON 07/11/2024] thiamine  (VITAMIN B1) tablet 100 mg   Or   [START ON 07/11/2024] thiamine  (VITAMIN B1) injection 100 mg   acetaminophen  (TYLENOL ) tablet 650 mg   alum & mag hydroxide-simeth (MAALOX/MYLANTA) 200-200-20 MG/5ML suspension 30 mL   magnesium  hydroxide (MILK OF MAGNESIA) suspension 30 mL   haloperidol  (HALDOL ) tablet 5 mg   And   diphenhydrAMINE  (BENADRYL ) capsule 50 mg   haloperidol  lactate (HALDOL ) injection 5 mg   And   diphenhydrAMINE  (BENADRYL ) injection 50 mg   And   LORazepam  (ATIVAN ) injection 2 mg   haloperidol  lactate (HALDOL ) injection 10 mg   And   diphenhydrAMINE  (BENADRYL ) injection 50 mg   And    LORazepam  (ATIVAN ) injection 2 mg   PTA Medications  Medication Sig   valACYclovir  (VALTREX ) 500 MG tablet Take 500 mg by mouth daily as needed (For outbreaks).   hydrOXYzine  (ATARAX ) 25 MG tablet Take 1 tablet (25 mg total) by mouth every 6 (six) hours as needed for anxiety.   sertraline  (ZOLOFT ) 100 MG tablet Take 1 tablet (100 mg total) by mouth at bedtime.   Multiple Vitamin (MULTIVITAMIN WITH MINERALS) TABS tablet Take 1 tablet by mouth daily.   thiamine  (VITAMIN B-1) 100 MG tablet Take 1 tablet (100 mg total) by mouth daily.    Long Term Goals: Improvement in symptoms so as ready for discharge  Short Term Goals: Patient will verbalize feelings in meetings with treatment team members., Patient will attend at least of 50% of the groups daily., Pt will complete the PHQ9 on admission, day 3 and discharge., Patient will participate in completing the [place] Suicide Severity Rating Scale, Patient will score a low risk of violence for 24 hours prior to discharge, and Patient will take medications as prescribed daily.  Medical Decision Making  Admission to [facility]. Safety protocol initiated Agitation protocol initiated Ativan  detox protocol initiated Acetaminophen  650 mg PO Q 6 PRN Maalox 30 ml PO Q 4 PRN Milk of Magnesia 30 ml PO Daily PRN    Recommendations  Based on my evaluation the patient does not appear to have an emergency medical condition.  Randall Bouquet, NP 07/10/24  6:03 PM

## 2024-07-10 NOTE — ED Notes (Signed)
TTS on going. 

## 2024-07-11 DIAGNOSIS — F419 Anxiety disorder, unspecified: Secondary | ICD-10-CM | POA: Diagnosis not present

## 2024-07-11 DIAGNOSIS — R45851 Suicidal ideations: Secondary | ICD-10-CM | POA: Diagnosis not present

## 2024-07-11 DIAGNOSIS — F332 Major depressive disorder, recurrent severe without psychotic features: Secondary | ICD-10-CM | POA: Diagnosis not present

## 2024-07-11 DIAGNOSIS — F1029 Alcohol dependence with unspecified alcohol-induced disorder: Secondary | ICD-10-CM | POA: Diagnosis not present

## 2024-07-11 MED ORDER — LORAZEPAM 0.5 MG PO TABS
0.5000 mg | ORAL_TABLET | Freq: Three times a day (TID) | ORAL | Status: AC
  Administered 2024-07-12 (×3): 0.5 mg via ORAL
  Filled 2024-07-11 (×3): qty 1

## 2024-07-11 MED ORDER — MIRTAZAPINE 7.5 MG PO TABS
7.5000 mg | ORAL_TABLET | Freq: Every day | ORAL | Status: DC
  Administered 2024-07-11 – 2024-07-13 (×3): 7.5 mg via ORAL
  Filled 2024-07-11 (×4): qty 1

## 2024-07-11 MED ORDER — LORAZEPAM 0.5 MG PO TABS
0.5000 mg | ORAL_TABLET | Freq: Two times a day (BID) | ORAL | Status: AC
  Administered 2024-07-13 (×2): 0.5 mg via ORAL
  Filled 2024-07-11 (×2): qty 1

## 2024-07-11 MED ORDER — LORAZEPAM 0.5 MG PO TABS
0.5000 mg | ORAL_TABLET | Freq: Every day | ORAL | Status: AC
  Administered 2024-07-14: 0.5 mg via ORAL
  Filled 2024-07-11: qty 1

## 2024-07-11 MED ORDER — LORAZEPAM 1 MG PO TABS
1.0000 mg | ORAL_TABLET | Freq: Three times a day (TID) | ORAL | Status: AC
  Administered 2024-07-11 (×2): 1 mg via ORAL
  Filled 2024-07-11 (×2): qty 1

## 2024-07-11 NOTE — Care Management (Addendum)
 [facility] Care Management   Writer met with the patient and discussed discharge planning.  Patient requests long term inpatient substance abuse treatment.  Tax adviser referral packet to [facility] in [place] [email or web address].  Writer was only able to leave a HIPAA compliant voice mail message with the intake department [phone] Writer emailed a referral packet to [facility] Email:  [email or web address]. Phone: [phone] Fax Admission: [phone] Address: [address]  [place] [address]     Writer faxed referral packet to [facility] in [place]  Email: [email or web address].  Phone: [phone] Admission Cell: [phone] Fax Admission: [phone] Address: [address] [place] [address] Writer was only able to leave a HIPAA compliant voice mail message with the intake department      Writer email referral packet to [facility] in [place]   Email [email or web address].  Writer was only able to leave a HIPAA compliant voice mail message with the intake department [phone] Email:[email or web address] Phone: [phone] Fax Admission: [phone] Address: [address] [place] [address]      Writer email faxed packet to [facility] controller was only able to leave a HIPAA compliant voice mail message with the intake department [phone] Phone: [phone] Fax Admission: [phone] Address: [place], [place] Writer was only able to leave a HIPAA compliant voice mail message with the intake department   Part of the intake process is for the patient to write a 3-6 page autobiography giving details of the patients life as far back as they can remember up to and including personal reason to seek treatment.     Writer faxed referral to [facility], [facility], and [facility]

## 2024-07-11 NOTE — ED Provider Notes (Signed)
 Behavioral Health Progress Note  Date and Time: 07/11/2024 2:23 PM Name: Trevor [name] MRN:  [id number]  Subjective:  Trevor [name] was seen today in the common area on rounds. He is still having some withdrawal symptoms. He is not sleeping well due to withdrawal. He is eating but has a lot of nausea. He is hoping for a longer duration of rehab and is willing to travel. No hallucinations, paranoia or delusions.   Diagnosis:  Final diagnoses:  Alcohol use disorder  Severe episode of recurrent major depressive disorder, without psychotic features (HCC)    Total Time spent with patient: 15 minutes   Past Psychiatric History: Alcohol use disorder  Past Medical History: Hep C Family History: Grandparents had alcohol problems. Brother is recovered from alcohol Social History: Divorced. Lives with his mother. Has a 42 year-old daughter.   Sleep: Poor  Appetite:  Poor  Current Medications:  Current Facility-Administered Medications  Medication Dose Route Frequency Provider Last Rate Last Admin   acetaminophen  (TYLENOL ) tablet 650 mg  650 mg Oral Q6H PRN Randall, Veronique M, NP       alum & mag hydroxide-simeth (MAALOX/MYLANTA) 200-200-20 MG/5ML suspension 30 mL  30 mL Oral Q4H PRN Motley-Mangrum, Jadeka A, PMHNP       haloperidol  (HALDOL ) tablet 5 mg  5 mg Oral TID PRN Motley-Mangrum, Jadeka A, PMHNP       And   diphenhydrAMINE  (BENADRYL ) capsule 50 mg  50 mg Oral TID PRN Motley-Mangrum, Jadeka A, PMHNP   50 mg at 07/11/24 1110   haloperidol  lactate (HALDOL ) injection 5 mg  5 mg Intramuscular TID PRN Motley-Mangrum, Jadeka A, PMHNP       And   diphenhydrAMINE  (BENADRYL ) injection 50 mg  50 mg Intramuscular TID PRN Motley-Mangrum, Jadeka A, PMHNP       And   LORazepam  (ATIVAN ) injection 2 mg  2 mg Intramuscular TID PRN Motley-Mangrum, Jadeka A, PMHNP       haloperidol  lactate (HALDOL ) injection 10 mg  10 mg Intramuscular TID PRN Motley-Mangrum, Jadeka A, PMHNP       And   diphenhydrAMINE   (BENADRYL ) injection 50 mg  50 mg Intramuscular TID PRN Motley-Mangrum, Jadeka A, PMHNP       And   LORazepam  (ATIVAN ) injection 2 mg  2 mg Intramuscular TID PRN Motley-Mangrum, Jadeka A, PMHNP       folic acid  (FOLVITE ) tablet 1 mg  1 mg Oral Daily Motley-Mangrum, Jadeka A, PMHNP   1 mg at 07/11/24 0943   LORazepam  (ATIVAN ) tablet 1-4 mg  1-4 mg Oral Q1H PRN Motley-Mangrum, Jadeka A, PMHNP   1 mg at 07/10/24 1830   Or   LORazepam  (ATIVAN ) injection 1-4 mg  1-4 mg Intravenous Q1H PRN Motley-Mangrum, Jadeka A, PMHNP       magnesium  hydroxide (MILK OF MAGNESIA) suspension 30 mL  30 mL Oral Daily PRN Motley-Mangrum, Jadeka A, PMHNP       multivitamin with minerals tablet 1 tablet  1 tablet Oral Daily Motley-Mangrum, Jadeka A, PMHNP   1 tablet at 07/11/24 0942   thiamine  (VITAMIN B1) tablet 100 mg  100 mg Oral Daily Motley-Mangrum, Jadeka A, PMHNP   100 mg at 07/11/24 9057   Or   thiamine  (VITAMIN B1) injection 100 mg  100 mg Intravenous Daily Motley-Mangrum, Jadeka A, PMHNP       Current Outpatient Medications  Medication Sig Dispense Refill   hydrOXYzine  (ATARAX ) 25 MG tablet Take 1 tablet (25 mg total) by mouth every 6 (six)  hours as needed for anxiety. 60 tablet 0   Multiple Vitamin (MULTIVITAMIN WITH MINERALS) TABS tablet Take 1 tablet by mouth daily.     naltrexone  380 MG SUSR IM injection Inject 380 mg into the muscle every 28 (twenty-eight) days.     sertraline  (ZOLOFT ) 50 MG tablet Take 50 mg by mouth daily.     valACYclovir  (VALTREX ) 500 MG tablet Take 500 mg by mouth daily as needed (For outbreaks).      Labs  Lab Results:  Admission on 07/10/2024, Discharged on 07/10/2024  Component Date Value Ref Range Status   Sodium 07/10/2024 139  135 - 145 mmol/L Final   Potassium 07/10/2024 3.4 (L)  3.5 - 5.1 mmol/L Final   Chloride 07/10/2024 102  98 - 111 mmol/L Final   CO2 07/10/2024 23  22 - 32 mmol/L Final   Glucose, Bld 07/10/2024 111 (H)  70 - 99 mg/dL Final   Glucose reference  range applies only to samples taken after fasting for at least 8 hours.   BUN 07/10/2024 15  6 - 20 mg/dL Final   Creatinine, Ser 07/10/2024 0.80  0.61 - 1.24 mg/dL Final   Calcium  07/10/2024 8.2 (L)  8.9 - 10.3 mg/dL Final   Total Protein 90/85/7974 7.8  6.5 - 8.1 g/dL Final   Albumin 90/85/7974 4.4  3.5 - 5.0 g/dL Final   AST 90/85/7974 29  15 - 41 U/L Final   ALT 07/10/2024 33  0 - 44 U/L Final   Alkaline Phosphatase 07/10/2024 54  38 - 126 U/L Final   Total Bilirubin 07/10/2024 0.7  0.0 - 1.2 mg/dL Final   GFR, Estimated 07/10/2024 >60  >60 mL/min Final   Comment: (NOTE) Calculated using the CKD-EPI Creatinine Equation (2021)    Anion gap 07/10/2024 14  5 - 15 Final   Performed at [facility], [address] [place], [place] [address]   Alcohol, Ethyl (B) 07/10/2024 356 (HH)  <15 mg/dL Final   Comment: CRITICAL RESULT CALLED TO, READ BACK BY AND VERIFIED WITH PRUITT,G ON 07/10/24 AT 0352 BY PURDIE,J (NOTE) For medical purposes only. Performed at [facility], [address] [place], [place] [address]    WBC 07/10/2024 8.4  4.0 - 10.5 K/uL Final   RBC 07/10/2024 5.16  4.22 - 5.81 MIL/uL Final   Hemoglobin 07/10/2024 16.7  13.0 - 17.0 g/dL Final   HCT 90/85/7974 46.7  39.0 - 52.0 % Final   MCV 07/10/2024 90.5  80.0 - 100.0 fL Final   MCH 07/10/2024 32.4  26.0 - 34.0 pg Final   MCHC 07/10/2024 35.8  30.0 - 36.0 g/dL Final   RDW 90/85/7974 12.8  11.5 - 15.5 % Final   Platelets 07/10/2024 344  150 - 400 K/uL Final   nRBC 07/10/2024 0.0  0.0 - 0.2 % Final   Performed at [facility], [address], [place], [place] [address]   Opiates 07/10/2024 NONE DETECTED  NONE DETECTED Final   Cocaine 07/10/2024 NONE DETECTED  NONE DETECTED Final   Benzodiazepines 07/10/2024 NONE DETECTED  NONE DETECTED Final   Amphetamines 07/10/2024 NONE DETECTED  NONE DETECTED Final   Tetrahydrocannabinol 07/10/2024 NONE DETECTED  NONE DETECTED Final   Barbiturates 07/10/2024 NONE DETECTED  NONE DETECTED  Final   Comment: (NOTE) DRUG SCREEN FOR MEDICAL PURPOSES ONLY.  IF CONFIRMATION IS NEEDED FOR ANY PURPOSE, NOTIFY LAB WITHIN 5 DAYS.  LOWEST DETECTABLE LIMITS FOR URINE DRUG SCREEN Drug Class  Cutoff (ng/mL) Amphetamine and metabolites    1000 Barbiturate and metabolites    200 Benzodiazepine                 200 Opiates and metabolites        300 Cocaine and metabolites        300 THC                            50 Performed at [facility], [address], [place], [place] [address]    Magnesium  07/10/2024 2.2  1.7 - 2.4 mg/dL Final   Performed at [facility], [address], [place], [place] [address]  Admission on 05/13/2024, Discharged on 05/20/2024  Component Date Value Ref Range Status   Hgb A1c MFr Bld 05/15/2024 4.8  4.8 - 5.6 % Final   Comment: (NOTE) Diagnosis of Diabetes The following HbA1c ranges recommended by the American Diabetes Association (ADA) may be used as an aid in the diagnosis of diabetes mellitus.  Hemoglobin             Suggested A1C NGSP%              Diagnosis  <5.7                   Non Diabetic  5.7-6.4                Pre-Diabetic  >6.4                   Diabetic  <7.0                   Glycemic control for                       adults with diabetes.     Mean Plasma Glucose 05/15/2024 91.06  mg/dL Final   Performed at [facility], [address] [place], [place] [address]   Cholesterol 05/15/2024 187  0 - 200 mg/dL Final   Triglycerides 92/79/7974 186 (H)  <150 mg/dL Final   HDL 92/79/7974 59  >40 mg/dL Final   Total CHOL/HDL Ratio 05/15/2024 3.2  RATIO Final   VLDL 05/15/2024 37  0 - 40 mg/dL Final   LDL Cholesterol 05/15/2024 91  0 - 99 mg/dL Final   Comment:        Total Cholesterol/HDL:CHD Risk Coronary Heart Disease Risk Table                     Men   Women  1/2 Average Risk   3.4   3.3  Average Risk       5.0   4.4  2 X Average Risk   9.6   7.1  3 X Average Risk  23.4   11.0        Use the  calculated Patient Ratio above and the CHD Risk Table to determine the patient's CHD Risk.        ATP III CLASSIFICATION (LDL):  <100     mg/dL   Optimal  899-870  mg/dL   Near or Above                    Optimal  130-159  mg/dL   Borderline  839-810  mg/dL   High  >809     mg/dL   Very High Performed at [facility]  Lab, [address], [place], [place] [address]    Vit D, 25-Hydroxy 05/15/2024 78.38  30 - 100 ng/mL Final   Comment: (NOTE) Vitamin D  deficiency has been defined by the Institute of Medicine  and an Endocrine Society practice guideline as a level of serum 25-OH  vitamin D  less than 20 ng/mL (1,2). The Endocrine Society went on to  further define vitamin D  insufficiency as a level between 21 and 29  ng/mL (2).  1. IOM (Institute of Medicine). 2010. Dietary reference intakes for  calcium  and D. [place]: [employer]. 2. Holick MF, Binkley Clara, Bischoff-Ferrari HA, et al. Evaluation,  treatment, and prevention of vitamin D  deficiency: an Endocrine  Society clinical practice guideline, JCEM. 2011 Jul; 96(7): 1911-30.  Performed at [facility], [address] [place], [place] [address]    Vitamin B-12 05/15/2024 265  180 - 914 pg/mL Final   Comment: (NOTE) This assay is not validated for testing neonatal or myeloproliferative syndrome specimens for Vitamin B12 levels. Performed at [facility], [address], [place], [place] [address]    Magnesium  05/15/2024 1.9  1.7 - 2.4 mg/dL Final   Performed at [facility], [address] [place], [place] [address]   TSH 05/15/2024 1.255  0.350 - 4.500 uIU/mL Final   Comment: Performed by a 3rd Generation assay with a functional sensitivity of <=0.01 uIU/mL. Performed at [facility], [address], [place], [place] [address]   Admission on 05/13/2024, Discharged on 05/13/2024  Component Date Value Ref Range Status   WBC 05/13/2024 8.3  4.0 - 10.5 K/uL Final   RBC  05/13/2024 5.19  4.22 - 5.81 MIL/uL Final   Hemoglobin 05/13/2024 16.7  13.0 - 17.0 g/dL Final   HCT 92/81/7974 48.0  39.0 - 52.0 % Final   MCV 05/13/2024 92.5  80.0 - 100.0 fL Final   MCH 05/13/2024 32.2  26.0 - 34.0 pg Final   MCHC 05/13/2024 34.8  30.0 - 36.0 g/dL Final   RDW 92/81/7974 13.5  11.5 - 15.5 % Final   Platelets 05/13/2024 329  150 - 400 K/uL Final   nRBC 05/13/2024 0.0  0.0 - 0.2 % Final   Neutrophils Relative % 05/13/2024 80  % Final   Neutro Abs 05/13/2024 6.6  1.7 - 7.7 K/uL Final   Lymphocytes Relative 05/13/2024 14  % Final   Lymphs Abs 05/13/2024 1.2  0.7 - 4.0 K/uL Final   Monocytes Relative 05/13/2024 5  % Final   Monocytes Absolute 05/13/2024 0.4  0.1 - 1.0 K/uL Final   Eosinophils Relative 05/13/2024 0  % Final   Eosinophils Absolute 05/13/2024 0.0  0.0 - 0.5 K/uL Final   Basophils Relative 05/13/2024 0  % Final   Basophils Absolute 05/13/2024 0.0  0.0 - 0.1 K/uL Final   Immature Granulocytes 05/13/2024 1  % Final   Abs Immature Granulocytes 05/13/2024 0.04  0.00 - 0.07 K/uL Final   Performed at [facility], [address] [place], [place] [address]   Sodium 05/13/2024 138  135 - 145 mmol/L Final   Potassium 05/13/2024 3.9  3.5 - 5.1 mmol/L Final   Chloride 05/13/2024 97 (L)  98 - 111 mmol/L Final   CO2 05/13/2024 30  22 - 32 mmol/L Final   Glucose, Bld 05/13/2024 103 (H)  70 - 99 mg/dL Final   Glucose reference range applies only to samples taken after fasting for at least  8 hours.   BUN 05/13/2024 8  6 - 20 mg/dL Final   Creatinine, Ser 05/13/2024 0.82  0.61 - 1.24 mg/dL Final   Calcium  05/13/2024 9.0  8.9 - 10.3 mg/dL Final   Total Protein 92/81/7974 7.1  6.5 - 8.1 g/dL Final   Albumin 92/81/7974 4.1  3.5 - 5.0 g/dL Final   AST 92/81/7974 23  15 - 41 U/L Final   ALT 05/13/2024 21  0 - 44 U/L Final   Alkaline Phosphatase 05/13/2024 80  38 - 126 U/L Final   Total Bilirubin 05/13/2024 0.8  0.0 - 1.2 mg/dL Final   GFR, Estimated 05/13/2024 >60   >60 mL/min Final   Comment: (NOTE) Calculated using the CKD-EPI Creatinine Equation (2021)    Anion gap 05/13/2024 11  5 - 15 Final   Performed at [facility], [address], [place], [place] [address]   Magnesium  05/13/2024 1.7  1.7 - 2.4 mg/dL Final   Performed at [facility], [address], [place], [place] [address]   Alcohol, Ethyl (B) 05/13/2024 52 (H)  <15 mg/dL Final   Comment: (NOTE) For medical purposes only. Performed at [facility], [address], [place], [place] [address]    POC Amphetamine UR 05/13/2024 None Detected  NONE DETECTED (Cut Off Level 1000 ng/mL) Final   POC Secobarbital (BAR) 05/13/2024 None Detected  NONE DETECTED (Cut Off Level 300 ng/mL) Final   POC Buprenorphine (BUP) 05/13/2024 None Detected  NONE DETECTED (Cut Off Level 10 ng/mL) Final   POC Oxazepam (BZO) 05/13/2024 None Detected  NONE DETECTED (Cut Off Level 300 ng/mL) Final   POC Cocaine UR 05/13/2024 None Detected  NONE DETECTED (Cut Off Level 300 ng/mL) Final   POC Methamphetamine UR 05/13/2024 None Detected  NONE DETECTED (Cut Off Level 1000 ng/mL) Final   POC Morphine  05/13/2024 None Detected  NONE DETECTED (Cut Off Level 300 ng/mL) Final   POC Methadone UR 05/13/2024 None Detected  NONE DETECTED (Cut Off Level 300 ng/mL) Final   POC Oxycodone  UR 05/13/2024 None Detected  NONE DETECTED (Cut Off Level 100 ng/mL) Final   POC Marijuana UR 05/13/2024 None Detected  NONE DETECTED (Cut Off Level 50 ng/mL) Final  Admission on 01/23/2024, Discharged on 02/02/2024  Component Date Value Ref Range Status   Sodium 01/30/2024 137  135 - 145 mmol/L Final   Potassium 01/30/2024 4.1  3.5 - 5.1 mmol/L Final   Chloride 01/30/2024 101  98 - 111 mmol/L Final   CO2 01/30/2024 30  22 - 32 mmol/L Final   Glucose, Bld 01/30/2024 92  70 - 99 mg/dL Final   Glucose reference range applies only to samples taken after fasting for at least 8 hours.   BUN 01/30/2024 17  6 - 20 mg/dL Final    Creatinine, Ser 01/30/2024 1.03  0.61 - 1.24 mg/dL Final   Calcium  01/30/2024 8.9  8.9 - 10.3 mg/dL Final   Total Protein 95/94/7974 7.2  6.5 - 8.1 g/dL Final   Albumin 95/94/7974 4.1  3.5 - 5.0 g/dL Final   AST 95/94/7974 18  15 - 41 U/L Final   ALT 01/30/2024 19  0 - 44 U/L Final   Alkaline Phosphatase 01/30/2024 43  38 - 126 U/L Final   Total Bilirubin 01/30/2024 0.6  0.0 - 1.2 mg/dL Final   GFR, Estimated 01/30/2024 >60  >60 mL/min Final   Comment: (NOTE) Calculated using the CKD-EPI Creatinine Equation (2021)    Anion gap  01/30/2024 6  5 - 15 Final   Performed at [facility], [address] [place], [place] [address]  Admission on 01/22/2024, Discharged on 01/23/2024  Component Date Value Ref Range Status   WBC 01/22/2024 6.8  4.0 - 10.5 K/uL Final   RBC 01/22/2024 5.22  4.22 - 5.81 MIL/uL Final   Hemoglobin 01/22/2024 17.2 (H)  13.0 - 17.0 g/dL Final   HCT 96/71/7974 47.8  39.0 - 52.0 % Final   MCV 01/22/2024 91.6  80.0 - 100.0 fL Final   MCH 01/22/2024 33.0  26.0 - 34.0 pg Final   MCHC 01/22/2024 36.0  30.0 - 36.0 g/dL Final   RDW 96/71/7974 12.1  11.5 - 15.5 % Final   Platelets 01/22/2024 319  150 - 400 K/uL Final   nRBC 01/22/2024 0.0  0.0 - 0.2 % Final   Neutrophils Relative % 01/22/2024 70  % Final   Neutro Abs 01/22/2024 4.8  1.7 - 7.7 K/uL Final   Lymphocytes Relative 01/22/2024 25  % Final   Lymphs Abs 01/22/2024 1.7  0.7 - 4.0 K/uL Final   Monocytes Relative 01/22/2024 4  % Final   Monocytes Absolute 01/22/2024 0.3  0.1 - 1.0 K/uL Final   Eosinophils Relative 01/22/2024 0  % Final   Eosinophils Absolute 01/22/2024 0.0  0.0 - 0.5 K/uL Final   Basophils Relative 01/22/2024 1  % Final   Basophils Absolute 01/22/2024 0.1  0.0 - 0.1 K/uL Final   Immature Granulocytes 01/22/2024 0  % Final   Abs Immature Granulocytes 01/22/2024 0.02  0.00 - 0.07 K/uL Final   Performed at [facility], [address] [place], [place] [address]   Sodium 01/22/2024 138  135  - 145 mmol/L Final   Potassium 01/22/2024 3.9  3.5 - 5.1 mmol/L Final   Chloride 01/22/2024 99  98 - 111 mmol/L Final   CO2 01/22/2024 25  22 - 32 mmol/L Final   Glucose, Bld 01/22/2024 99  70 - 99 mg/dL Final   Glucose reference range applies only to samples taken after fasting for at least 8 hours.   BUN 01/22/2024 13  6 - 20 mg/dL Final   Creatinine, Ser 01/22/2024 0.83  0.61 - 1.24 mg/dL Final   Calcium  01/22/2024 8.9  8.9 - 10.3 mg/dL Final   Total Protein 96/71/7974 7.5  6.5 - 8.1 g/dL Final   Albumin 96/71/7974 4.6  3.5 - 5.0 g/dL Final   AST 96/71/7974 44 (H)  15 - 41 U/L Final   ALT 01/22/2024 46 (H)  0 - 44 U/L Final   Alkaline Phosphatase 01/22/2024 60  38 - 126 U/L Final   Total Bilirubin 01/22/2024 0.8  0.0 - 1.2 mg/dL Final   GFR, Estimated 01/22/2024 >60  >60 mL/min Final   Comment: (NOTE) Calculated using the CKD-EPI Creatinine Equation (2021)    Anion gap 01/22/2024 14  5 - 15 Final   Performed at [facility], [address] [place], [place] [address]   Hgb A1c MFr Bld 01/22/2024 5.0  4.8 - 5.6 % Final   Comment: (NOTE) Pre diabetes:          5.7%-6.4%  Diabetes:              >6.4%  Glycemic control for   <7.0% adults with diabetes    Mean Plasma Glucose 01/22/2024 96.8  mg/dL Final   Performed at [facility], [address] [place], [place] [address]  Magnesium  01/22/2024 1.9  1.7 - 2.4 mg/dL Final   Performed at [facility], [address] [place], [place] [address]   Alcohol, Ethyl (B) 01/22/2024 109 (H)  <10 mg/dL Final   Comment: (NOTE) Lowest detectable limit for serum alcohol is 10 mg/dL.  For medical purposes only. Performed at [facility], [address], [place], [place] [address]    Cholesterol 01/22/2024 202 (H)  0 - 200 mg/dL Final   Triglycerides 96/71/7974 65  <150 mg/dL Final   HDL 96/71/7974 84  >40 mg/dL Final   Total CHOL/HDL Ratio 01/22/2024 2.4  RATIO Final   VLDL 01/22/2024 13  0 - 40 mg/dL Final    LDL Cholesterol 01/22/2024 105 (H)  0 - 99 mg/dL Final   Comment:        Total Cholesterol/HDL:CHD Risk Coronary Heart Disease Risk Table                     Men   Women  1/2 Average Risk   3.4   3.3  Average Risk       5.0   4.4  2 X Average Risk   9.6   7.1  3 X Average Risk  23.4   11.0        Use the calculated Patient Ratio above and the CHD Risk Table to determine the patient's CHD Risk.        ATP III CLASSIFICATION (LDL):  <100     mg/dL   Optimal  899-870  mg/dL   Near or Above                    Optimal  130-159  mg/dL   Borderline  839-810  mg/dL   High  >809     mg/dL   Very High Performed at [facility], [address] [place], [place] [address]    TSH 01/22/2024 0.458  0.350 - 4.500 uIU/mL Final   Comment: Performed by a 3rd Generation assay with a functional sensitivity of <=0.01 uIU/mL. Performed at [facility], [address], [place], [place] [address]    RPR Ser Ql 01/22/2024 NON REACTIVE  NON REACTIVE Final   Performed at [facility], [address], [place], [place] [address]   Color, Urine 01/22/2024 YELLOW  YELLOW Final   APPearance 01/22/2024 CLEAR  CLEAR Final   Specific Gravity, Urine 01/22/2024 1.020  1.005 - 1.030 Final   pH 01/22/2024 5.0  5.0 - 8.0 Final   Glucose, UA 01/22/2024 NEGATIVE  NEGATIVE mg/dL Final   Hgb urine dipstick 01/22/2024 NEGATIVE  NEGATIVE Final   Bilirubin Urine 01/22/2024 NEGATIVE  NEGATIVE Final   Ketones, ur 01/22/2024 5 (A)  NEGATIVE mg/dL Final   Protein, ur 96/71/7974 NEGATIVE  NEGATIVE mg/dL Final   Nitrite 96/71/7974 NEGATIVE  NEGATIVE Final   Leukocytes,Ua 01/22/2024 NEGATIVE  NEGATIVE Final   Performed at [facility], [address] [place], [place] [address]   POC Amphetamine UR 01/22/2024 None Detected   Final   POC Secobarbital (BAR) 01/22/2024 None Detected   Final   POC Buprenorphine (BUP) 01/22/2024 None Detected   Final   POC Oxazepam (BZO) 01/22/2024 None Detected   Final    POC Cocaine UR 01/22/2024 None Detected   Final   POC Methamphetamine UR 01/22/2024 None Detected   Final   POC Morphine  01/22/2024 None Detected   Final   POC  Methadone UR 01/22/2024 None Detected   Final   POC Oxycodone  UR 01/22/2024 None Detected   Final   POC Marijuana UR 01/22/2024 None Detected   Final    Blood Alcohol level:  Lab Results  Component Value Date   ETH 356 (HH) 07/10/2024   ETH 52 (H) 05/13/2024    Metabolic Disorder Labs: Lab Results  Component Value Date   HGBA1C 4.8 05/15/2024   MPG 91.06 05/15/2024   MPG 96.8 01/22/2024   No results found for: PROLACTIN Lab Results  Component Value Date   CHOL 187 05/15/2024   TRIG 186 (H) 05/15/2024   HDL 59 05/15/2024   CHOLHDL 3.2 05/15/2024   VLDL 37 05/15/2024   LDLCALC 91 05/15/2024   LDLCALC 105 (H) 01/22/2024    Therapeutic Lab Levels: No results found for: LITHIUM No results found for: VALPROATE No results found for: CBMZ  Physical Findings   AUDIT    Flowsheet Row ED from 05/13/2024 in [facility] ED from 01/23/2024 in [facility]  Alcohol Use Disorder Identification Test Final Score (AUDIT) 26 36   GAD-7    Flowsheet Row Counselor from 10/22/2023 in [facility]  Total GAD-7 Score 8   PHQ2-9    Flowsheet Row ED from 07/10/2024 in [facility] ED from 05/13/2024 in [facility] ED from 01/23/2024 in [facility] ED from 01/22/2024 in [facility] Counselor from 10/22/2023 in [facility]  PHQ-2 Total Score 2 0 3 4 0  PHQ-9 Total Score 7 9 14 12  --   Flowsheet Row ED from 07/10/2024 in [facility] Most recent reading at 07/10/2024  4:50 PM ED from 07/10/2024 in [facility] Emergency Department at [facility] Most recent reading at  07/10/2024  3:07 AM ED from 05/13/2024 in [facility] Most recent reading at 05/13/2024  4:39 PM  C-SSRS RISK CATEGORY No Risk Low Risk No Risk     Musculoskeletal  Strength & Muscle Tone: within normal limits Gait & Station: normal Patient leans: N/A  Psychiatric Specialty Exam  Presentation  General Appearance:  Casual  Eye Contact: Fair  Speech: Clear and Coherent  Speech Volume: Normal  Handedness: Right   Mood and Affect  Mood: Anxious; Depressed  Affect: Depressed; Flat   Thought Process  Thought Processes: Coherent  Descriptions of Associations:Intact  Orientation:Full (Time, Place and Person)  Thought Content:WDL  Diagnosis of Schizophrenia or Schizoaffective disorder in past: No    Hallucinations:Hallucinations: None  Ideas of Reference:None  Suicidal Thoughts:Suicidal Thoughts: No  Homicidal Thoughts:Homicidal Thoughts: No   Sensorium  Memory: Immediate Fair; Recent Fair; Remote Fair  Judgment: Fair  Insight: Fair   Art therapist  Concentration: Fair  Attention Span: Fair  Recall: [employer] of Knowledge: Fair  Language: Fair   Psychomotor Activity  Psychomotor Activity: Psychomotor Activity: Restlessness   Assets  Assets: Manufacturing systems engineer; Desire for Improvement; Physical Health   Sleep  Sleep: Sleep: Fair  Estimated Sleeping Duration (Last 24 Hours): 9.75-11.25 hours  Nutritional Assessment (For [facility] and [facility] admissions only) Has the patient had a weight loss or gain of 10 pounds or more in the last 3 months?: No Has the patient had a decrease in food intake/or appetite?: No Does the patient have dental problems?: No Does the patient have eating habits or behaviors that may be  indicators of an eating disorder including binging or inducing vomiting?: No Has the patient recently lost weight without trying?: 0 Has the patient been eating poorly because of a decreased  appetite?: 0 Malnutrition Screening Tool Score: 0    Physical Exam  Physical Exam Vitals and nursing note reviewed.  HENT:     Head: Normocephalic and atraumatic.  Eyes:     Extraocular Movements: Extraocular movements intact.  Pulmonary:     Effort: Pulmonary effort is normal.  Musculoskeletal:        General: Normal range of motion.     Cervical back: Normal range of motion.  Neurological:     General: No focal deficit present.     Mental Status: He is alert and oriented to person, place, and time.     Motor: Tremor present.  Psychiatric:        Behavior: Behavior normal.    Review of Systems  Constitutional:  Positive for diaphoresis. Negative for fever.  Respiratory:  Negative for cough.   Cardiovascular:  Negative for chest pain.  Gastrointestinal:  Positive for nausea. Negative for constipation and diarrhea.  Genitourinary:  Negative for dysuria.  Musculoskeletal:  Negative for myalgias.  Neurological:  Positive for tremors.  Psychiatric/Behavioral:  Negative for hallucinations and suicidal ideas.    Blood pressure (!) 133/94, pulse 64, temperature 98.2 F (36.8 C), temperature source Oral, resp. rate 17, SpO2 96%. There is no height or weight on file to calculate BMI.  Treatment Plan Summary: Long Term Goals: Improvement in symptoms so as ready for discharge   Short Term Goals: Patient will verbalize feelings in meetings with treatment team members., Patient will attend at least of 50% of the groups daily., Pt will complete the PHQ9 on admission, day 3 and discharge., and Patient will take medications as prescribed daily.  Medications: Mood/anxiety: continue group therapy, milieu therapy, 1:1 evaluation with provider.  Medication management:  Remeron  7.5mg  PO at bedtime for mood, anxiety, sleep and appetite  Substance Abuse:  Severe alcohol use disorder: replacing thiamine . Encourage adequate PO intake. May require lab monitoring of electrolytes. Benzodiazepine  taper as tolerated. Seizure precautions. Monitor for delirium tremens.  Nicotine and tobacco use: N/A Medical: PRNs for pain, constipation, indigestion available.  Labs/studies: no new labs Safety and Monitoring: voluntarily admission to [facility]/Facility based care unit [facility]) unit for safety, stabilization and treatment Daily contact with patient to assess and evaluate symptoms and progress in treatment Patient's case to be discussed in multi-disciplinary team meeting Observation Level : q15 minute checks Vital signs: q12 hours Precautions: withdrawal  Based on my evaluation the patient does not appear to have an emergency medical condition.   Corean Anette Potters, MD 07/11/2024 2:23 PM

## 2024-07-11 NOTE — ED Notes (Signed)
 Patient A&Ox4. Denies intent to harm self/others when asked. Denies A/VH. Patient denies any physical complaints when asked. No acute distress noted. Support and encouragement provided. Routine safety checks conducted according to facility protocol. Encouraged patient to notify staff if thoughts of harm toward self or others arise. Patient verbalize understanding and agreement. Will continue to monitor for safety.

## 2024-07-11 NOTE — ED Notes (Signed)
 Pt actively participating in group. No acute distress noted. No concerns voiced. Informed pt to notify staff with any needs or assistance. Pt verbalized understanding and agreement. Will continue to monitor for safety.

## 2024-07-11 NOTE — ED Notes (Signed)
 Pt sitting in dayroom watching television and interacting with peers. No acute distress noted. No concerns voiced. Informed pt to notify staff with any needs or assistance. Pt verbalized understanding and agreement. Will continue to monitor for safety.

## 2024-07-11 NOTE — Group Note (Signed)
 Group Topic: Change and Accountability  Group Date: 07/11/2024 Start Time: 1930 End Time: 2000 Facilitators: Anice Benton LABOR, NT  Department: [facility]  Number of Participants: 6  Group Focus: clarity of thought, feeling awareness/expression, and self-awareness Treatment Modality:  Individual Therapy and Psychoeducation Interventions utilized were assignment Purpose: enhance coping skills, explore maladaptive thinking, and express feelings  Name: Trevor [name] Date of Birth: 06-Sep-1981  MR: [id number]    Level of Participation: active Quality of Participation: attentive Interactions with others: gave feedback Mood/Affect: appropriate Triggers (if applicable): N/A Cognition: coherent/clear Progress: Moderate Response: Good Plan: follow-up needed  Patients Problems:  Patient Active Problem List   Diagnosis Date Noted   MDD (major depressive disorder) 07/10/2024   Moderate episode of recurrent major depressive disorder (HCC) 01/30/2024   GAD (generalized anxiety disorder) 01/30/2024   Transaminitis 06/18/2022   GERD (gastroesophageal reflux disease) 06/18/2022   Hypokalemia 06/18/2022   Chronic hepatitis C without hepatic coma (HCC) 06/28/2019   Psoriasis 06/07/2019   H/O withdrawal symptoms, alcohol, with delirium and seizures (HCC) 06/07/2019   Alcohol use disorder, severe, dependence (HCC) 06/03/2019   Obesity (BMI 30-39.9) 01/09/2016   HSV-2 (herpes simplex virus 2) infection

## 2024-07-11 NOTE — ED Notes (Signed)
Patient asleep, NAD 

## 2024-07-11 NOTE — Group Note (Signed)
 Group Topic: Change and Accountability  Group Date: 07/11/2024 Start Time: 1000 End Time: 1100 Facilitators: Alyse Leilani LABOR, NT  Department: [facility]  Number of Participants: 8  Group Focus: abuse issues, acceptance, and chemical dependency education Treatment Modality:  Interpersonal Therapy Interventions utilized were group exercise Purpose: enhance coping skills, express feelings, increase insight, and relapse prevention strategies  Name: Trevor [name] Date of Birth: 1981/05/14  MR: [id number]    Level of Participation: active Quality of Participation: cooperative and engaged Interactions with others: gave feedback Mood/Affect: bright and positive Triggers (if applicable): none Cognition: coherent/clear and insightful Progress: Gaining insight Response: none Plan: patient will be encouraged to keep attend groups  Patients Problems:  Patient Active Problem List   Diagnosis Date Noted   MDD (major depressive disorder) 07/10/2024   Moderate episode of recurrent major depressive disorder (HCC) 01/30/2024   GAD (generalized anxiety disorder) 01/30/2024   Transaminitis 06/18/2022   GERD (gastroesophageal reflux disease) 06/18/2022   Hypokalemia 06/18/2022   Chronic hepatitis C without hepatic coma (HCC) 06/28/2019   Psoriasis 06/07/2019   H/O withdrawal symptoms, alcohol, with delirium and seizures (HCC) 06/07/2019   Alcohol use disorder, severe, dependence (HCC) 06/03/2019   Obesity (BMI 30-39.9) 01/09/2016   HSV-2 (herpes simplex virus 2) infection

## 2024-07-12 DIAGNOSIS — F332 Major depressive disorder, recurrent severe without psychotic features: Secondary | ICD-10-CM | POA: Diagnosis not present

## 2024-07-12 DIAGNOSIS — F419 Anxiety disorder, unspecified: Secondary | ICD-10-CM | POA: Diagnosis not present

## 2024-07-12 DIAGNOSIS — F1029 Alcohol dependence with unspecified alcohol-induced disorder: Secondary | ICD-10-CM | POA: Diagnosis not present

## 2024-07-12 DIAGNOSIS — R45851 Suicidal ideations: Secondary | ICD-10-CM | POA: Diagnosis not present

## 2024-07-12 MED ORDER — HYDROXYZINE HCL 25 MG PO TABS
25.0000 mg | ORAL_TABLET | Freq: Three times a day (TID) | ORAL | Status: DC | PRN
  Administered 2024-07-12 – 2024-07-17 (×11): 25 mg via ORAL
  Filled 2024-07-12 (×12): qty 1

## 2024-07-12 NOTE — ED Notes (Signed)
 Pt currently sleeping. Q15 safety checks in place.

## 2024-07-12 NOTE — Group Note (Signed)
 Group Topic: Social Support  Group Date: 07/12/2024 Start Time: 2000 End Time: 2130 Facilitators: Joan Plowman B  Department: [facility]  Number of Participants: 9  Group Focus: abuse issues, activities of daily living skills, anger management, community group, coping skills, feeling awareness/expression, goals/reality orientation, healthy friendships, personal responsibility, relapse prevention, and substance abuse education Treatment Modality:  Psychoeducation Interventions utilized were leisure development and support Purpose: express feelings  Name: Trevor [name] Date of Birth: Jul 28, 1981  MR: [id number]    Level of Participation: active Quality of Participation: cooperative Interactions with others: gave feedback Mood/Affect: appropriate Triggers (if applicable): NA Cognition: coherent/clear Progress: Gaining insight Response: NA Plan: patient will be encouraged to keep going to groups  Patients Problems:  Patient Active Problem List   Diagnosis Date Noted   MDD (major depressive disorder) 07/10/2024   Moderate episode of recurrent major depressive disorder (HCC) 01/30/2024   GAD (generalized anxiety disorder) 01/30/2024   Transaminitis 06/18/2022   GERD (gastroesophageal reflux disease) 06/18/2022   Hypokalemia 06/18/2022   Chronic hepatitis C without hepatic coma (HCC) 06/28/2019   Psoriasis 06/07/2019   H/O withdrawal symptoms, alcohol, with delirium and seizures (HCC) 06/07/2019   Alcohol use disorder, severe, dependence (HCC) 06/03/2019   Obesity (BMI 30-39.9) 01/09/2016   HSV-2 (herpes simplex virus 2) infection

## 2024-07-12 NOTE — ED Notes (Signed)
 Pt presents as somewhat anxious, says that he was experiencing sweating last night. Pt denies si hi avh, verbal contract for safety provided. Pt a ate breakfast, denies physical pain and discomforts. Pt compliant with AM medications. Pt generally cooperative.

## 2024-07-12 NOTE — ED Notes (Signed)
 Pt denies concerns or complaints. Pt currently watching TV in dayroom with other pts.

## 2024-07-12 NOTE — ED Notes (Signed)
 Pt is sleeping at the moment. No acute distress noted. Q15 safety checks in place per facility policy.

## 2024-07-12 NOTE — ED Notes (Signed)
 Pt administered dose of prn atarax  for c/o anxiety. Pt reports that previous 8/10 headache is now a 2/10, tylenol  was effective. Pt ate lunch, currently resting in bed.

## 2024-07-12 NOTE — [facility] Group Notes (Signed)
 Spirituality Group   Description: Participant directed exploration of values, beliefs and meaning  **Focus on values, community, and sources of support  Following a brief framework of chaplain's role and ground rules of group behavior, participants are invited to share concerns or questions that engage spiritual life. Emphasis placed on common themes and shared experiences and ways to make meaning and clarify living into one's values.   Theory/Process/Goal: Utilize the theoretical framework of group therapy established by Celena Kite, Relational Cultural Theory and Rogerian approaches to facilitate relational empathy and use of the "here and now" to foster reflection, self-awareness, and sharing.   Observations: Trevor [name] was quiet and reserved but appeared to passively engage in the group discussion.  [name] L. [name] HERO.Div

## 2024-07-12 NOTE — ED Provider Notes (Addendum)
 Behavioral Health Progress Note  Date and Time: 07/12/2024 3:05 PM Name: Trevor [name] MRN:  [id number]  Subjective:  Trevor [name] was seen in his room on rounds today. He is feeling alright overall. He is still having some symptoms of withdrawal, including headache, tremor and seeing spots. He is sleeping and eating okay. He is aware of the phone interview today with [facility] and [facility]. He is able to advocate for self. He is active in dispo planning.   Diagnosis:  Final diagnoses:  Alcohol use disorder  Severe episode of recurrent major depressive disorder, without psychotic features (HCC)    Total Time spent with patient: 15 minutes  Past Psychiatric History: Alcohol use disorder  Past Medical History: Hep C Family History: Grandparents had alcohol problems. Brother is recovered from alcohol Social History: Divorced. Lives with his mother. Has a 49 year-old daughter.   Sleep: Good  Appetite:  Good  Current Medications:  Current Facility-Administered Medications  Medication Dose Route Frequency Provider Last Rate Last Admin   acetaminophen  (TYLENOL ) tablet 650 mg  650 mg Oral Q6H PRN Randall Starlyn HERO, NP   650 mg at 07/12/24 1251   alum & mag hydroxide-simeth (MAALOX/MYLANTA) 200-200-20 MG/5ML suspension 30 mL  30 mL Oral Q4H PRN Motley-Mangrum, Jadeka A, PMHNP       haloperidol  (HALDOL ) tablet 5 mg  5 mg Oral TID PRN Motley-Mangrum, Jadeka A, PMHNP       And   diphenhydrAMINE  (BENADRYL ) capsule 50 mg  50 mg Oral TID PRN Motley-Mangrum, Jadeka A, PMHNP   50 mg at 07/11/24 1110   haloperidol  lactate (HALDOL ) injection 5 mg  5 mg Intramuscular TID PRN Motley-Mangrum, Jadeka A, PMHNP       And   diphenhydrAMINE  (BENADRYL ) injection 50 mg  50 mg Intramuscular TID PRN Motley-Mangrum, Jadeka A, PMHNP       And   LORazepam  (ATIVAN ) injection 2 mg  2 mg Intramuscular TID PRN Motley-Mangrum, Jadeka A, PMHNP       haloperidol  lactate (HALDOL ) injection 10 mg  10 mg  Intramuscular TID PRN Motley-Mangrum, Jadeka A, PMHNP       And   diphenhydrAMINE  (BENADRYL ) injection 50 mg  50 mg Intramuscular TID PRN Motley-Mangrum, Jadeka A, PMHNP       And   LORazepam  (ATIVAN ) injection 2 mg  2 mg Intramuscular TID PRN Motley-Mangrum, Jadeka A, PMHNP       folic acid  (FOLVITE ) tablet 1 mg  1 mg Oral Daily Motley-Mangrum, Jadeka A, PMHNP   1 mg at 07/12/24 9075   hydrOXYzine  (ATARAX ) tablet 25 mg  25 mg Oral TID PRN Leigh Corean Massa, MD   25 mg at 07/12/24 1342   LORazepam  (ATIVAN ) tablet 1-4 mg  1-4 mg Oral Q1H PRN Motley-Mangrum, Jadeka A, PMHNP   1 mg at 07/10/24 1830   Or   LORazepam  (ATIVAN ) injection 1-4 mg  1-4 mg Intravenous Q1H PRN Motley-Mangrum, Jadeka A, PMHNP       LORazepam  (ATIVAN ) tablet 0.5 mg  0.5 mg Oral TID Leigh Corean Massa, MD   0.5 mg at 07/12/24 9075   Followed by   NOREEN ON 07/13/2024] LORazepam  (ATIVAN ) tablet 0.5 mg  0.5 mg Oral BID Leigh Corean Massa, MD       Followed by   NOREEN ON 07/14/2024] LORazepam  (ATIVAN ) tablet 0.5 mg  0.5 mg Oral QHS Makaylah Oddo, Corean Massa, MD       magnesium  hydroxide (MILK OF MAGNESIA) suspension 30 mL  30 mL  Oral Daily PRN Motley-Mangrum, Jadeka A, PMHNP       mirtazapine  (REMERON ) tablet 7.5 mg  7.5 mg Oral QHS Makaylyn Sinyard, Corean Massa, MD   7.5 mg at 07/11/24 2112   multivitamin with minerals tablet 1 tablet  1 tablet Oral Daily Motley-Mangrum, Jadeka A, PMHNP   1 tablet at 07/12/24 9075   thiamine  (VITAMIN B1) tablet 100 mg  100 mg Oral Daily Motley-Mangrum, Jadeka A, PMHNP   100 mg at 07/12/24 9075   Or   thiamine  (VITAMIN B1) injection 100 mg  100 mg Intravenous Daily Motley-Mangrum, Jadeka A, PMHNP       Current Outpatient Medications  Medication Sig Dispense Refill   hydrOXYzine  (ATARAX ) 25 MG tablet Take 1 tablet (25 mg total) by mouth every 6 (six) hours as needed for anxiety. 60 tablet 0   Multiple Vitamin (MULTIVITAMIN WITH MINERALS) TABS tablet Take 1 tablet by mouth daily.     naltrexone   380 MG SUSR IM injection Inject 380 mg into the muscle every 28 (twenty-eight) days.     sertraline  (ZOLOFT ) 50 MG tablet Take 50 mg by mouth daily.     valACYclovir  (VALTREX ) 500 MG tablet Take 500 mg by mouth daily as needed (For outbreaks).      Labs  Lab Results:  Admission on 07/10/2024, Discharged on 07/10/2024  Component Date Value Ref Range Status   Sodium 07/10/2024 139  135 - 145 mmol/L Final   Potassium 07/10/2024 3.4 (L)  3.5 - 5.1 mmol/L Final   Chloride 07/10/2024 102  98 - 111 mmol/L Final   CO2 07/10/2024 23  22 - 32 mmol/L Final   Glucose, Bld 07/10/2024 111 (H)  70 - 99 mg/dL Final   Glucose reference range applies only to samples taken after fasting for at least 8 hours.   BUN 07/10/2024 15  6 - 20 mg/dL Final   Creatinine, Ser 07/10/2024 0.80  0.61 - 1.24 mg/dL Final   Calcium  07/10/2024 8.2 (L)  8.9 - 10.3 mg/dL Final   Total Protein 90/85/7974 7.8  6.5 - 8.1 g/dL Final   Albumin 90/85/7974 4.4  3.5 - 5.0 g/dL Final   AST 90/85/7974 29  15 - 41 U/L Final   ALT 07/10/2024 33  0 - 44 U/L Final   Alkaline Phosphatase 07/10/2024 54  38 - 126 U/L Final   Total Bilirubin 07/10/2024 0.7  0.0 - 1.2 mg/dL Final   GFR, Estimated 07/10/2024 >60  >60 mL/min Final   Comment: (NOTE) Calculated using the CKD-EPI Creatinine Equation (2021)    Anion gap 07/10/2024 14  5 - 15 Final   Performed at [facility], [address], [place], [place] [address]   Alcohol, Ethyl (B) 07/10/2024 356 (HH)  <15 mg/dL Final   Comment: CRITICAL RESULT CALLED TO, READ BACK BY AND VERIFIED WITH PRUITT,G ON 07/10/24 AT 0352 BY PURDIE,J (NOTE) For medical purposes only. Performed at [facility], [address] [place], [place] [address]    WBC 07/10/2024 8.4  4.0 - 10.5 K/uL Final   RBC 07/10/2024 5.16  4.22 - 5.81 MIL/uL Final   Hemoglobin 07/10/2024 16.7  13.0 - 17.0 g/dL Final   HCT 90/85/7974 46.7  39.0 - 52.0 % Final   MCV 07/10/2024 90.5  80.0 - 100.0 fL Final   MCH 07/10/2024 32.4  26.0  - 34.0 pg Final   MCHC 07/10/2024 35.8  30.0 - 36.0 g/dL Final   RDW 90/85/7974 12.8  11.5 - 15.5 % Final  Platelets 07/10/2024 344  150 - 400 K/uL Final   nRBC 07/10/2024 0.0  0.0 - 0.2 % Final   Performed at [facility], [address], [place], [place] [address]   Opiates 07/10/2024 NONE DETECTED  NONE DETECTED Final   Cocaine 07/10/2024 NONE DETECTED  NONE DETECTED Final   Benzodiazepines 07/10/2024 NONE DETECTED  NONE DETECTED Final   Amphetamines 07/10/2024 NONE DETECTED  NONE DETECTED Final   Tetrahydrocannabinol 07/10/2024 NONE DETECTED  NONE DETECTED Final   Barbiturates 07/10/2024 NONE DETECTED  NONE DETECTED Final   Comment: (NOTE) DRUG SCREEN FOR MEDICAL PURPOSES ONLY.  IF CONFIRMATION IS NEEDED FOR ANY PURPOSE, NOTIFY LAB WITHIN 5 DAYS.  LOWEST DETECTABLE LIMITS FOR URINE DRUG SCREEN Drug Class                     Cutoff (ng/mL) Amphetamine and metabolites    1000 Barbiturate and metabolites    200 Benzodiazepine                 200 Opiates and metabolites        300 Cocaine and metabolites        300 THC                            50 Performed at [facility], [address], [place], [place] [address]    Magnesium  07/10/2024 2.2  1.7 - 2.4 mg/dL Final   Performed at [facility], [address] [place], [place] [address]  Admission on 05/13/2024, Discharged on 05/20/2024  Component Date Value Ref Range Status   Hgb A1c MFr Bld 05/15/2024 4.8  4.8 - 5.6 % Final   Comment: (NOTE) Diagnosis of Diabetes The following HbA1c ranges recommended by the American Diabetes Association (ADA) may be used as an aid in the diagnosis of diabetes mellitus.  Hemoglobin             Suggested A1C NGSP%              Diagnosis  <5.7                   Non Diabetic  5.7-6.4                Pre-Diabetic  >6.4                   Diabetic  <7.0                   Glycemic control for                       adults with diabetes.     Mean Plasma Glucose 05/15/2024 91.06  mg/dL  Final   Performed at [facility], [address], [place], [place] [address]   Cholesterol 05/15/2024 187  0 - 200 mg/dL Final   Triglycerides 92/79/7974 186 (H)  <150 mg/dL Final   HDL 92/79/7974 59  >40 mg/dL Final   Total CHOL/HDL Ratio 05/15/2024 3.2  RATIO Final   VLDL 05/15/2024 37  0 - 40 mg/dL Final   LDL Cholesterol 05/15/2024 91  0 - 99 mg/dL Final   Comment:        Total Cholesterol/HDL:CHD Risk Coronary Heart Disease Risk Table                     Men   Women  1/2  Average Risk   3.4   3.3  Average Risk       5.0   4.4  2 X Average Risk   9.6   7.1  3 X Average Risk  23.4   11.0        Use the calculated Patient Ratio above and the CHD Risk Table to determine the patient's CHD Risk.        ATP III CLASSIFICATION (LDL):  <100     mg/dL   Optimal  899-870  mg/dL   Near or Above                    Optimal  130-159  mg/dL   Borderline  839-810  mg/dL   High  >809     mg/dL   Very High Performed at [facility], [address] [place], [place] [address]    Vit D, 25-Hydroxy 05/15/2024 78.38  30 - 100 ng/mL Final   Comment: (NOTE) Vitamin D  deficiency has been defined by the Institute of Medicine  and an Endocrine Society practice guideline as a level of serum 25-OH  vitamin D  less than 20 ng/mL (1,2). The Endocrine Society went on to  further define vitamin D  insufficiency as a level between 21 and 29  ng/mL (2).  1. IOM (Institute of Medicine). 2010. Dietary reference intakes for  calcium  and D. [place]: [employer]. 2. Holick MF, Binkley [place], Bischoff-Ferrari HA, et al. Evaluation,  treatment, and prevention of vitamin D  deficiency: an Endocrine  Society clinical practice guideline, JCEM. 2011 Jul; 96(7): 1911-30.  Performed at [facility], [address] [place], [place] [address]    Vitamin B-12 05/15/2024 265  180 - 914 pg/mL Final   Comment: (NOTE) This assay is not validated for testing neonatal  or myeloproliferative syndrome specimens for Vitamin B12 levels. Performed at [facility], [address] [place], [place] [address]    Magnesium  05/15/2024 1.9  1.7 - 2.4 mg/dL Final   Performed at [facility], [address], [place], [place] [address]   TSH 05/15/2024 1.255  0.350 - 4.500 uIU/mL Final   Comment: Performed by a 3rd Generation assay with a functional sensitivity of <=0.01 uIU/mL. Performed at [facility], [address], [place], [place] [address]   Admission on 05/13/2024, Discharged on 05/13/2024  Component Date Value Ref Range Status   WBC 05/13/2024 8.3  4.0 - 10.5 K/uL Final   RBC 05/13/2024 5.19  4.22 - 5.81 MIL/uL Final   Hemoglobin 05/13/2024 16.7  13.0 - 17.0 g/dL Final   HCT 92/81/7974 48.0  39.0 - 52.0 % Final   MCV 05/13/2024 92.5  80.0 - 100.0 fL Final   MCH 05/13/2024 32.2  26.0 - 34.0 pg Final   MCHC 05/13/2024 34.8  30.0 - 36.0 g/dL Final   RDW 92/81/7974 13.5  11.5 - 15.5 % Final   Platelets 05/13/2024 329  150 - 400 K/uL Final   nRBC 05/13/2024 0.0  0.0 - 0.2 % Final   Neutrophils Relative % 05/13/2024 80  % Final   Neutro Abs 05/13/2024 6.6  1.7 - 7.7 K/uL Final   Lymphocytes Relative 05/13/2024 14  % Final   Lymphs Abs 05/13/2024 1.2  0.7 - 4.0 K/uL Final   Monocytes Relative 05/13/2024 5  % Final   Monocytes Absolute 05/13/2024 0.4  0.1 - 1.0 K/uL Final   Eosinophils Relative 05/13/2024 0  %  Final   Eosinophils Absolute 05/13/2024 0.0  0.0 - 0.5 K/uL Final   Basophils Relative 05/13/2024 0  % Final   Basophils Absolute 05/13/2024 0.0  0.0 - 0.1 K/uL Final   Immature Granulocytes 05/13/2024 1  % Final   Abs Immature Granulocytes 05/13/2024 0.04  0.00 - 0.07 K/uL Final   Performed at [facility], [address], [place], [place] [address]   Sodium 05/13/2024 138  135 - 145 mmol/L Final   Potassium 05/13/2024 3.9  3.5 - 5.1 mmol/L Final   Chloride 05/13/2024 97 (L)  98 - 111 mmol/L Final   CO2 05/13/2024 30  22 -  32 mmol/L Final   Glucose, Bld 05/13/2024 103 (H)  70 - 99 mg/dL Final   Glucose reference range applies only to samples taken after fasting for at least 8 hours.   BUN 05/13/2024 8  6 - 20 mg/dL Final   Creatinine, Ser 05/13/2024 0.82  0.61 - 1.24 mg/dL Final   Calcium  05/13/2024 9.0  8.9 - 10.3 mg/dL Final   Total Protein 92/81/7974 7.1  6.5 - 8.1 g/dL Final   Albumin 92/81/7974 4.1  3.5 - 5.0 g/dL Final   AST 92/81/7974 23  15 - 41 U/L Final   ALT 05/13/2024 21  0 - 44 U/L Final   Alkaline Phosphatase 05/13/2024 80  38 - 126 U/L Final   Total Bilirubin 05/13/2024 0.8  0.0 - 1.2 mg/dL Final   GFR, Estimated 05/13/2024 >60  >60 mL/min Final   Comment: (NOTE) Calculated using the CKD-EPI Creatinine Equation (2021)    Anion gap 05/13/2024 11  5 - 15 Final   Performed at [facility], [address], [place], [place] [address]   Magnesium  05/13/2024 1.7  1.7 - 2.4 mg/dL Final   Performed at [facility], [address], [place], [place] [address]   Alcohol, Ethyl (B) 05/13/2024 52 (H)  <15 mg/dL Final   Comment: (NOTE) For medical purposes only. Performed at [facility], [address], [place], [place] [address]    POC Amphetamine UR 05/13/2024 None Detected  NONE DETECTED (Cut Off Level 1000 ng/mL) Final   POC Secobarbital (BAR) 05/13/2024 None Detected  NONE DETECTED (Cut Off Level 300 ng/mL) Final   POC Buprenorphine (BUP) 05/13/2024 None Detected  NONE DETECTED (Cut Off Level 10 ng/mL) Final   POC Oxazepam (BZO) 05/13/2024 None Detected  NONE DETECTED (Cut Off Level 300 ng/mL) Final   POC Cocaine UR 05/13/2024 None Detected  NONE DETECTED (Cut Off Level 300 ng/mL) Final   POC Methamphetamine UR 05/13/2024 None Detected  NONE DETECTED (Cut Off Level 1000 ng/mL) Final   POC Morphine  05/13/2024 None Detected  NONE DETECTED (Cut Off Level 300 ng/mL) Final   POC Methadone UR 05/13/2024 None Detected  NONE DETECTED (Cut Off Level 300 ng/mL) Final   POC Oxycodone  UR  05/13/2024 None Detected  NONE DETECTED (Cut Off Level 100 ng/mL) Final   POC Marijuana UR 05/13/2024 None Detected  NONE DETECTED (Cut Off Level 50 ng/mL) Final  Admission on 01/23/2024, Discharged on 02/02/2024  Component Date Value Ref Range Status   Sodium 01/30/2024 137  135 - 145 mmol/L Final   Potassium 01/30/2024 4.1  3.5 - 5.1 mmol/L Final   Chloride 01/30/2024 101  98 - 111 mmol/L Final   CO2 01/30/2024 30  22 - 32 mmol/L Final   Glucose, Bld 01/30/2024 92  70 - 99 mg/dL Final  Glucose reference range applies only to samples taken after fasting for at least 8 hours.   BUN 01/30/2024 17  6 - 20 mg/dL Final   Creatinine, Ser 01/30/2024 1.03  0.61 - 1.24 mg/dL Final   Calcium  01/30/2024 8.9  8.9 - 10.3 mg/dL Final   Total Protein 95/94/7974 7.2  6.5 - 8.1 g/dL Final   Albumin 95/94/7974 4.1  3.5 - 5.0 g/dL Final   AST 95/94/7974 18  15 - 41 U/L Final   ALT 01/30/2024 19  0 - 44 U/L Final   Alkaline Phosphatase 01/30/2024 43  38 - 126 U/L Final   Total Bilirubin 01/30/2024 0.6  0.0 - 1.2 mg/dL Final   GFR, Estimated 01/30/2024 >60  >60 mL/min Final   Comment: (NOTE) Calculated using the CKD-EPI Creatinine Equation (2021)    Anion gap 01/30/2024 6  5 - 15 Final   Performed at [facility], [address] [place], [place] [address]  Admission on 01/22/2024, Discharged on 01/23/2024  Component Date Value Ref Range Status   WBC 01/22/2024 6.8  4.0 - 10.5 K/uL Final   RBC 01/22/2024 5.22  4.22 - 5.81 MIL/uL Final   Hemoglobin 01/22/2024 17.2 (H)  13.0 - 17.0 g/dL Final   HCT 96/71/7974 47.8  39.0 - 52.0 % Final   MCV 01/22/2024 91.6  80.0 - 100.0 fL Final   MCH 01/22/2024 33.0  26.0 - 34.0 pg Final   MCHC 01/22/2024 36.0  30.0 - 36.0 g/dL Final   RDW 96/71/7974 12.1  11.5 - 15.5 % Final   Platelets 01/22/2024 319  150 - 400 K/uL Final   nRBC 01/22/2024 0.0  0.0 - 0.2 % Final   Neutrophils Relative % 01/22/2024 70  % Final   Neutro Abs 01/22/2024 4.8  1.7 - 7.7 K/uL  Final   Lymphocytes Relative 01/22/2024 25  % Final   Lymphs Abs 01/22/2024 1.7  0.7 - 4.0 K/uL Final   Monocytes Relative 01/22/2024 4  % Final   Monocytes Absolute 01/22/2024 0.3  0.1 - 1.0 K/uL Final   Eosinophils Relative 01/22/2024 0  % Final   Eosinophils Absolute 01/22/2024 0.0  0.0 - 0.5 K/uL Final   Basophils Relative 01/22/2024 1  % Final   Basophils Absolute 01/22/2024 0.1  0.0 - 0.1 K/uL Final   Immature Granulocytes 01/22/2024 0  % Final   Abs Immature Granulocytes 01/22/2024 0.02  0.00 - 0.07 K/uL Final   Performed at [facility], [address], [place], [place] [address]   Sodium 01/22/2024 138  135 - 145 mmol/L Final   Potassium 01/22/2024 3.9  3.5 - 5.1 mmol/L Final   Chloride 01/22/2024 99  98 - 111 mmol/L Final   CO2 01/22/2024 25  22 - 32 mmol/L Final   Glucose, Bld 01/22/2024 99  70 - 99 mg/dL Final   Glucose reference range applies only to samples taken after fasting for at least 8 hours.   BUN 01/22/2024 13  6 - 20 mg/dL Final   Creatinine, Ser 01/22/2024 0.83  0.61 - 1.24 mg/dL Final   Calcium  01/22/2024 8.9  8.9 - 10.3 mg/dL Final   Total Protein 96/71/7974 7.5  6.5 - 8.1 g/dL Final   Albumin 96/71/7974 4.6  3.5 - 5.0 g/dL Final   AST 96/71/7974 44 (H)  15 - 41 U/L Final   ALT 01/22/2024 46 (H)  0 - 44 U/L Final   Alkaline Phosphatase 01/22/2024 60  38 - 126 U/L  Final   Total Bilirubin 01/22/2024 0.8  0.0 - 1.2 mg/dL Final   GFR, Estimated 01/22/2024 >60  >60 mL/min Final   Comment: (NOTE) Calculated using the CKD-EPI Creatinine Equation (2021)    Anion gap 01/22/2024 14  5 - 15 Final   Performed at [facility], [address], [place], [place] [address]   Hgb A1c MFr Bld 01/22/2024 5.0  4.8 - 5.6 % Final   Comment: (NOTE) Pre diabetes:          5.7%-6.4%  Diabetes:              >6.4%  Glycemic control for   <7.0% adults with diabetes    Mean Plasma Glucose 01/22/2024 96.8  mg/dL Final   Performed at [facility], [address], [place], [place] [address]   Magnesium  01/22/2024 1.9  1.7 - 2.4 mg/dL Final   Performed at [facility], [address], [place], [place] [address]   Alcohol, Ethyl (B) 01/22/2024 109 (H)  <10 mg/dL Final   Comment: (NOTE) Lowest detectable limit for serum alcohol is 10 mg/dL.  For medical purposes only. Performed at [facility], [address] [place], [place] [address]    Cholesterol 01/22/2024 202 (H)  0 - 200 mg/dL Final   Triglycerides 96/71/7974 65  <150 mg/dL Final   HDL 96/71/7974 84  >40 mg/dL Final   Total CHOL/HDL Ratio 01/22/2024 2.4  RATIO Final   VLDL 01/22/2024 13  0 - 40 mg/dL Final   LDL Cholesterol 01/22/2024 105 (H)  0 - 99 mg/dL Final   Comment:        Total Cholesterol/HDL:CHD Risk Coronary Heart Disease Risk Table                     Men   Women  1/2 Average Risk   3.4   3.3  Average Risk       5.0   4.4  2 X Average Risk   9.6   7.1  3 X Average Risk  23.4   11.0        Use the calculated Patient Ratio above and the CHD Risk Table to determine the patient's CHD Risk.        ATP III CLASSIFICATION (LDL):  <100     mg/dL   Optimal  899-870  mg/dL   Near or Above                    Optimal  130-159  mg/dL   Borderline  839-810  mg/dL   High  >809     mg/dL   Very High Performed at [facility], [address], [place], [place] [address]    TSH 01/22/2024 0.458  0.350 - 4.500 uIU/mL Final   Comment: Performed by a 3rd Generation assay with a functional sensitivity of <=0.01 uIU/mL. Performed at [facility], [address] [place], [place] [address]    RPR Ser Ql 01/22/2024 NON REACTIVE  NON REACTIVE Final   Performed at [facility], [address] [place], [place] [address]   Color, Urine 01/22/2024 YELLOW  YELLOW Final   APPearance 01/22/2024 CLEAR  CLEAR Final   Specific Gravity, Urine 01/22/2024 1.020  1.005 - 1.030 Final   pH 01/22/2024 5.0  5.0 - 8.0 Final   Glucose, UA 01/22/2024 NEGATIVE  NEGATIVE mg/dL  Final   Hgb urine dipstick  01/22/2024 NEGATIVE  NEGATIVE Final   Bilirubin Urine 01/22/2024 NEGATIVE  NEGATIVE Final   Ketones, ur 01/22/2024 5 (A)  NEGATIVE mg/dL Final   Protein, ur 96/71/7974 NEGATIVE  NEGATIVE mg/dL Final   Nitrite 96/71/7974 NEGATIVE  NEGATIVE Final   Leukocytes,Ua 01/22/2024 NEGATIVE  NEGATIVE Final   Performed at [facility], [address] [place], [place] [address]   POC Amphetamine UR 01/22/2024 None Detected   Final   POC Secobarbital (BAR) 01/22/2024 None Detected   Final   POC Buprenorphine (BUP) 01/22/2024 None Detected   Final   POC Oxazepam (BZO) 01/22/2024 None Detected   Final   POC Cocaine UR 01/22/2024 None Detected   Final   POC Methamphetamine UR 01/22/2024 None Detected   Final   POC Morphine  01/22/2024 None Detected   Final   POC Methadone UR 01/22/2024 None Detected   Final   POC Oxycodone  UR 01/22/2024 None Detected   Final   POC Marijuana UR 01/22/2024 None Detected   Final    Blood Alcohol level:  Lab Results  Component Value Date   ETH 356 (HH) 07/10/2024   ETH 52 (H) 05/13/2024    Metabolic Disorder Labs: Lab Results  Component Value Date   HGBA1C 4.8 05/15/2024   MPG 91.06 05/15/2024   MPG 96.8 01/22/2024   No results found for: PROLACTIN Lab Results  Component Value Date   CHOL 187 05/15/2024   TRIG 186 (H) 05/15/2024   HDL 59 05/15/2024   CHOLHDL 3.2 05/15/2024   VLDL 37 05/15/2024   LDLCALC 91 05/15/2024   LDLCALC 105 (H) 01/22/2024    Therapeutic Lab Levels: No results found for: LITHIUM No results found for: VALPROATE No results found for: CBMZ  Physical Findings   AUDIT    Flowsheet Row ED from 05/13/2024 in [facility] ED from 01/23/2024 in [facility]  Alcohol Use Disorder Identification Test Final Score (AUDIT) 26 36   GAD-7    Flowsheet Row Counselor from 10/22/2023 in [facility]  Total GAD-7 Score  8   PHQ2-9    Flowsheet Row ED from 07/10/2024 in [facility] ED from 05/13/2024 in [facility] ED from 01/23/2024 in [facility] ED from 01/22/2024 in [facility] Counselor from 10/22/2023 in [facility]  PHQ-2 Total Score 2 0 3 4 0  PHQ-9 Total Score 7 9 14 12  --   Flowsheet Row ED from 07/10/2024 in [facility] Most recent reading at 07/10/2024  4:50 PM ED from 07/10/2024 in [facility] Emergency Department at [facility] Most recent reading at 07/10/2024  3:07 AM ED from 05/13/2024 in [facility] Most recent reading at 05/13/2024  4:39 PM  C-SSRS RISK CATEGORY No Risk Low Risk No Risk     Musculoskeletal  Strength & Muscle Tone: within normal limits Gait & Station: normal Patient leans: N/A  Psychiatric Specialty Exam  Presentation  General Appearance:  Casual  Eye Contact: Good  Speech: Normal Rate  Speech Volume: Normal  Handedness: Right   Mood and Affect  Mood: Anxious  Affect: Congruent   Thought Process  Thought Processes: Linear  Descriptions of Associations:Intact  Orientation:Full (Time, Place and Person)  Thought Content:Logical  Diagnosis of Schizophrenia or Schizoaffective disorder in past: No    Hallucinations:Hallucinations: None  Ideas of Reference:None  Suicidal Thoughts:Suicidal  Thoughts: No  Homicidal Thoughts:Homicidal Thoughts: No   Sensorium  Memory: Immediate Good; Recent Fair  Judgment: Fair  Insight: Fair   Executive Functions  Concentration: Good  Attention Span: Good  Recall: Good  Fund of Knowledge: Good  Language: Good   Psychomotor Activity  Psychomotor Activity: Psychomotor Activity: Normal   Assets  Assets: Communication Skills; Desire for Improvement; Leisure Time   Sleep   Sleep: Sleep: Good  Estimated Sleeping Duration (Last 24 Hours): 10.75-11.25 hours  No data recorded  Physical Exam  Physical Exam Vitals and nursing note reviewed.  Constitutional:      Appearance: Normal appearance.  HENT:     Head: Normocephalic.  Eyes:     Extraocular Movements: Extraocular movements intact.  Pulmonary:     Effort: Pulmonary effort is normal.  Musculoskeletal:        General: Normal range of motion.     Cervical back: Normal range of motion.  Neurological:     General: No focal deficit present.     Mental Status: He is alert and oriented to person, place, and time.  Psychiatric:        Behavior: Behavior normal.    Review of Systems  Constitutional:  Negative for fever.  Eyes:        Spots  Respiratory:  Negative for cough.   Cardiovascular:  Negative for chest pain.  Gastrointestinal:  Negative for constipation, diarrhea, nausea and vomiting.  Genitourinary:  Negative for dysuria.  Neurological:  Positive for tremors and headaches.  Psychiatric/Behavioral:  Negative for hallucinations and suicidal ideas.    Blood pressure 125/86, pulse 98, temperature 97.6 F (36.4 C), temperature source Oral, resp. rate 17, SpO2 95%. There is no height or weight on file to calculate BMI.  Treatment Plan Summary: Long Term Goals: Improvement in symptoms so as ready for discharge   Short Term Goals: Patient will verbalize feelings in meetings with treatment team members., Patient will attend at least of 50% of the groups daily., Pt will complete the PHQ9 on admission, day 3 and discharge., and Patient will take medications as prescribed daily.   Medications: Mood/anxiety: continue group therapy, milieu therapy, 1:1 evaluation with provider.  Medication management:  Remeron  7.5mg  PO at bedtime for mood, anxiety, sleep and appetite  Substance Abuse:  Severe alcohol use disorder: replacing thiamine . Encourage adequate PO intake. May require lab monitoring of  electrolytes. Benzodiazepine taper as tolerated. Seizure precautions. Monitor for delirium tremens.  Nicotine and tobacco use: N/A Medical: PRNs for pain, constipation, indigestion available.  Labs/studies: no new labs Safety and Monitoring: voluntarily admission to [facility]/Facility based care unit [facility]) unit for safety, stabilization and treatment Daily contact with patient to assess and evaluate symptoms and progress in treatment Patient's case to be discussed in multi-disciplinary team meeting Observation Level : q15 minute checks Vital signs: q12 hours Precautions: withdrawal   Based on my evaluation the patient does not appear to have an emergency medical condition.     Corean Anette Potters, MD 07/12/2024 3:05 PM

## 2024-07-12 NOTE — Care Management (Signed)
  [facility] Care Management  [facility] treatment facility - Patient completed the 3-6 autobiography and the application paperwork.  Writer faxed the completed paperwork to the    [facility] At capacity   [facility] reports that the patient must complete detox and a 2 week inpatient program before he can apply to be considered for placement at their facility.   [facility] - Left message.  [facility]  - Left Message   [facility] - Left Message   [facility] - Declined due to his insurance and because he is not a resident

## 2024-07-12 NOTE — ED Notes (Signed)
 Pt is sleeping at the moment. No acute distress noted. Q15 safety checks in place.

## 2024-07-12 NOTE — ED Notes (Signed)
 Pt resting in bed, no distress or complaints reported.

## 2024-07-12 NOTE — ED Notes (Signed)
 Pt observed and assessed in the nursing station. Pt denies SI/HI/AVH. Pt endorses anxiety and mild tremors, with no further complains.

## 2024-07-13 DIAGNOSIS — R45851 Suicidal ideations: Secondary | ICD-10-CM | POA: Diagnosis not present

## 2024-07-13 DIAGNOSIS — F1029 Alcohol dependence with unspecified alcohol-induced disorder: Secondary | ICD-10-CM | POA: Diagnosis not present

## 2024-07-13 DIAGNOSIS — F332 Major depressive disorder, recurrent severe without psychotic features: Secondary | ICD-10-CM | POA: Diagnosis not present

## 2024-07-13 DIAGNOSIS — F419 Anxiety disorder, unspecified: Secondary | ICD-10-CM | POA: Diagnosis not present

## 2024-07-13 NOTE — ED Provider Notes (Signed)
 Behavioral Health Progress Note  Date and Time: 07/13/2024 1:40 PM Name: Trevor [name] MRN:  [id number]  Subjective:  Logen was seen today in the common area. He is feeling better with regards to withdrawal symptoms. He is still having tremors, but the sweats have resolved. He has been making the interview calls and is hopeful for a longer program. He is sleeping and eating okay. Able to discuss discharge planning and engage in goal setting.   Diagnosis:  Final diagnoses:  Alcohol use disorder  Severe episode of recurrent major depressive disorder, without psychotic features (HCC)    Total Time spent with patient: 15 minutes  Past Psychiatric History: Alcohol use disorder  Past Medical History: Hep C Family History: Grandparents had alcohol problems. Brother is recovered from alcohol Social History: Divorced. Lives with his mother. Has a 74 year-old daughter.      Sleep: Good  Appetite:  Good  Current Medications:  Current Facility-Administered Medications  Medication Dose Route Frequency Provider Last Rate Last Admin   acetaminophen  (TYLENOL ) tablet 650 mg  650 mg Oral Q6H PRN Randall Starlyn HERO, NP   650 mg at 07/12/24 1251   alum & mag hydroxide-simeth (MAALOX/MYLANTA) 200-200-20 MG/5ML suspension 30 mL  30 mL Oral Q4H PRN Motley-Mangrum, Jadeka A, PMHNP       haloperidol  (HALDOL ) tablet 5 mg  5 mg Oral TID PRN Motley-Mangrum, Jadeka A, PMHNP       And   diphenhydrAMINE  (BENADRYL ) capsule 50 mg  50 mg Oral TID PRN Motley-Mangrum, Jadeka A, PMHNP   50 mg at 07/11/24 1110   haloperidol  lactate (HALDOL ) injection 5 mg  5 mg Intramuscular TID PRN Motley-Mangrum, Jadeka A, PMHNP       And   diphenhydrAMINE  (BENADRYL ) injection 50 mg  50 mg Intramuscular TID PRN Motley-Mangrum, Jadeka A, PMHNP       And   LORazepam  (ATIVAN ) injection 2 mg  2 mg Intramuscular TID PRN Motley-Mangrum, Jadeka A, PMHNP       haloperidol  lactate (HALDOL ) injection 10 mg  10 mg Intramuscular TID  PRN Motley-Mangrum, Jadeka A, PMHNP       And   diphenhydrAMINE  (BENADRYL ) injection 50 mg  50 mg Intramuscular TID PRN Motley-Mangrum, Jadeka A, PMHNP       And   LORazepam  (ATIVAN ) injection 2 mg  2 mg Intramuscular TID PRN Motley-Mangrum, Jadeka A, PMHNP       folic acid  (FOLVITE ) tablet 1 mg  1 mg Oral Daily Motley-Mangrum, Jadeka A, PMHNP   1 mg at 07/13/24 9047   hydrOXYzine  (ATARAX ) tablet 25 mg  25 mg Oral TID PRN Leigh Corean Massa, MD   25 mg at 07/12/24 1342   LORazepam  (ATIVAN ) tablet 0.5 mg  0.5 mg Oral BID Leigh Corean Massa, MD   0.5 mg at 07/13/24 9047   Followed by   NOREEN ON 07/14/2024] LORazepam  (ATIVAN ) tablet 0.5 mg  0.5 mg Oral QHS Odessa Nishi, Corean Massa, MD       magnesium  hydroxide (MILK OF MAGNESIA) suspension 30 mL  30 mL Oral Daily PRN Motley-Mangrum, Jadeka A, PMHNP       mirtazapine  (REMERON ) tablet 7.5 mg  7.5 mg Oral QHS Lorrayne Ismael, Corean Massa, MD   7.5 mg at 07/12/24 2109   multivitamin with minerals tablet 1 tablet  1 tablet Oral Daily Motley-Mangrum, Jadeka A, PMHNP   1 tablet at 07/13/24 9047   thiamine  (VITAMIN B1) tablet 100 mg  100 mg Oral Daily Motley-Mangrum, Jadeka A, PMHNP  100 mg at 07/13/24 9047   Or   thiamine  (VITAMIN B1) injection 100 mg  100 mg Intravenous Daily Motley-Mangrum, Jadeka A, PMHNP       Current Outpatient Medications  Medication Sig Dispense Refill   hydrOXYzine  (ATARAX ) 25 MG tablet Take 1 tablet (25 mg total) by mouth every 6 (six) hours as needed for anxiety. 60 tablet 0   Multiple Vitamin (MULTIVITAMIN WITH MINERALS) TABS tablet Take 1 tablet by mouth daily.     naltrexone  380 MG SUSR IM injection Inject 380 mg into the muscle every 28 (twenty-eight) days.     sertraline  (ZOLOFT ) 50 MG tablet Take 50 mg by mouth daily.     valACYclovir  (VALTREX ) 500 MG tablet Take 500 mg by mouth daily as needed (For outbreaks).      Labs  Lab Results:  Admission on 07/10/2024, Discharged on 07/10/2024  Component Date Value Ref Range  Status   Sodium 07/10/2024 139  135 - 145 mmol/L Final   Potassium 07/10/2024 3.4 (L)  3.5 - 5.1 mmol/L Final   Chloride 07/10/2024 102  98 - 111 mmol/L Final   CO2 07/10/2024 23  22 - 32 mmol/L Final   Glucose, Bld 07/10/2024 111 (H)  70 - 99 mg/dL Final   Glucose reference range applies only to samples taken after fasting for at least 8 hours.   BUN 07/10/2024 15  6 - 20 mg/dL Final   Creatinine, Ser 07/10/2024 0.80  0.61 - 1.24 mg/dL Final   Calcium  07/10/2024 8.2 (L)  8.9 - 10.3 mg/dL Final   Total Protein 90/85/7974 7.8  6.5 - 8.1 g/dL Final   Albumin 90/85/7974 4.4  3.5 - 5.0 g/dL Final   AST 90/85/7974 29  15 - 41 U/L Final   ALT 07/10/2024 33  0 - 44 U/L Final   Alkaline Phosphatase 07/10/2024 54  38 - 126 U/L Final   Total Bilirubin 07/10/2024 0.7  0.0 - 1.2 mg/dL Final   GFR, Estimated 07/10/2024 >60  >60 mL/min Final   Comment: (NOTE) Calculated using the CKD-EPI Creatinine Equation (2021)    Anion gap 07/10/2024 14  5 - 15 Final   Performed at [facility], [address] [place], [place] [address]   Alcohol, Ethyl (B) 07/10/2024 356 (HH)  <15 mg/dL Final   Comment: CRITICAL RESULT CALLED TO, READ BACK BY AND VERIFIED WITH PRUITT,G ON 07/10/24 AT 0352 BY PURDIE,J (NOTE) For medical purposes only. Performed at [facility], [address], [place], [place] [address]    WBC 07/10/2024 8.4  4.0 - 10.5 K/uL Final   RBC 07/10/2024 5.16  4.22 - 5.81 MIL/uL Final   Hemoglobin 07/10/2024 16.7  13.0 - 17.0 g/dL Final   HCT 90/85/7974 46.7  39.0 - 52.0 % Final   MCV 07/10/2024 90.5  80.0 - 100.0 fL Final   MCH 07/10/2024 32.4  26.0 - 34.0 pg Final   MCHC 07/10/2024 35.8  30.0 - 36.0 g/dL Final   RDW 90/85/7974 12.8  11.5 - 15.5 % Final   Platelets 07/10/2024 344  150 - 400 K/uL Final   nRBC 07/10/2024 0.0  0.0 - 0.2 % Final   Performed at [facility], [address], [place], [place] [address]   Opiates 07/10/2024 NONE DETECTED  NONE DETECTED Final   Cocaine 07/10/2024 NONE  DETECTED  NONE DETECTED Final   Benzodiazepines 07/10/2024 NONE DETECTED  NONE DETECTED Final   Amphetamines 07/10/2024 NONE DETECTED  NONE DETECTED Final   Tetrahydrocannabinol 07/10/2024  NONE DETECTED  NONE DETECTED Final   Barbiturates 07/10/2024 NONE DETECTED  NONE DETECTED Final   Comment: (NOTE) DRUG SCREEN FOR MEDICAL PURPOSES ONLY.  IF CONFIRMATION IS NEEDED FOR ANY PURPOSE, NOTIFY LAB WITHIN 5 DAYS.  LOWEST DETECTABLE LIMITS FOR URINE DRUG SCREEN Drug Class                     Cutoff (ng/mL) Amphetamine and metabolites    1000 Barbiturate and metabolites    200 Benzodiazepine                 200 Opiates and metabolites        300 Cocaine and metabolites        300 THC                            50 Performed at [facility], [address] [place], [place] [address]    Magnesium  07/10/2024 2.2  1.7 - 2.4 mg/dL Final   Performed at [facility], [address] [place], [place] [address]  Admission on 05/13/2024, Discharged on 05/20/2024  Component Date Value Ref Range Status   Hgb A1c MFr Bld 05/15/2024 4.8  4.8 - 5.6 % Final   Comment: (NOTE) Diagnosis of Diabetes The following HbA1c ranges recommended by the American Diabetes Association (ADA) may be used as an aid in the diagnosis of diabetes mellitus.  Hemoglobin             Suggested A1C NGSP%              Diagnosis  <5.7                   Non Diabetic  5.7-6.4                Pre-Diabetic  >6.4                   Diabetic  <7.0                   Glycemic control for                       adults with diabetes.     Mean Plasma Glucose 05/15/2024 91.06  mg/dL Final   Performed at [facility], [address], [place], [place] [address]   Cholesterol 05/15/2024 187  0 - 200 mg/dL Final   Triglycerides 92/79/7974 186 (H)  <150 mg/dL Final   HDL 92/79/7974 59  >40 mg/dL Final   Total CHOL/HDL Ratio 05/15/2024 3.2  RATIO Final   VLDL 05/15/2024 37  0 - 40 mg/dL Final   LDL Cholesterol 05/15/2024 91  0 -  99 mg/dL Final   Comment:        Total Cholesterol/HDL:CHD Risk Coronary Heart Disease Risk Table                     Men   Women  1/2 Average Risk   3.4   3.3  Average Risk       5.0   4.4  2 X Average Risk   9.6   7.1  3 X Average Risk  23.4   11.0        Use the calculated Patient Ratio above and the CHD Risk Table to determine the patient's CHD Risk.        ATP III CLASSIFICATION (LDL):  <  100     mg/dL   Optimal  899-870  mg/dL   Near or Above                    Optimal  130-159  mg/dL   Borderline  839-810  mg/dL   High  >809     mg/dL   Very High Performed at [facility], [address], [place], [place] [address]    Vit D, 25-Hydroxy 05/15/2024 78.38  30 - 100 ng/mL Final   Comment: (NOTE) Vitamin D  deficiency has been defined by the Institute of Medicine  and an Endocrine Society practice guideline as a level of serum 25-OH  vitamin D  less than 20 ng/mL (1,2). The Endocrine Society went on to  further define vitamin D  insufficiency as a level between 21 and 29  ng/mL (2).  1. IOM (Institute of Medicine). 2010. Dietary reference intakes for  calcium  and D. [place]: [employer]. 2. Holick MF, [place], Bischoff-Ferrari HA, et al. Evaluation,  treatment, and prevention of vitamin D  deficiency: an Endocrine  Society clinical practice guideline, JCEM. 2011 Jul; 96(7): 1911-30.  Performed at [facility], [address], [place], [place] [address]    Vitamin B-12 05/15/2024 265  180 - 914 pg/mL Final   Comment: (NOTE) This assay is not validated for testing neonatal or myeloproliferative syndrome specimens for Vitamin B12 levels. Performed at [facility], [address], [place], [place] [address]    Magnesium  05/15/2024 1.9  1.7 - 2.4 mg/dL Final   Performed at [facility], [address], [place], [place] [address]   TSH 05/15/2024 1.255  0.350 - 4.500 uIU/mL Final   Comment: Performed by a 3rd Generation assay  with a functional sensitivity of <=0.01 uIU/mL. Performed at [facility], [address], [place], [place] [address]   Admission on 05/13/2024, Discharged on 05/13/2024  Component Date Value Ref Range Status   WBC 05/13/2024 8.3  4.0 - 10.5 K/uL Final   RBC 05/13/2024 5.19  4.22 - 5.81 MIL/uL Final   Hemoglobin 05/13/2024 16.7  13.0 - 17.0 g/dL Final   HCT 92/81/7974 48.0  39.0 - 52.0 % Final   MCV 05/13/2024 92.5  80.0 - 100.0 fL Final   MCH 05/13/2024 32.2  26.0 - 34.0 pg Final   MCHC 05/13/2024 34.8  30.0 - 36.0 g/dL Final   RDW 92/81/7974 13.5  11.5 - 15.5 % Final   Platelets 05/13/2024 329  150 - 400 K/uL Final   nRBC 05/13/2024 0.0  0.0 - 0.2 % Final   Neutrophils Relative % 05/13/2024 80  % Final   Neutro Abs 05/13/2024 6.6  1.7 - 7.7 K/uL Final   Lymphocytes Relative 05/13/2024 14  % Final   Lymphs Abs 05/13/2024 1.2  0.7 - 4.0 K/uL Final   Monocytes Relative 05/13/2024 5  % Final   Monocytes Absolute 05/13/2024 0.4  0.1 - 1.0 K/uL Final   Eosinophils Relative 05/13/2024 0  % Final   Eosinophils Absolute 05/13/2024 0.0  0.0 - 0.5 K/uL Final   Basophils Relative 05/13/2024 0  % Final   Basophils Absolute 05/13/2024 0.0  0.0 - 0.1 K/uL Final   Immature Granulocytes 05/13/2024 1  % Final   Abs Immature Granulocytes 05/13/2024 0.04  0.00 - 0.07 K/uL Final   Performed at [facility], [address] [place], [place] [address]   Sodium 05/13/2024 138  135 - 145 mmol/L Final   Potassium 05/13/2024 3.9  3.5 - 5.1 mmol/L Final   Chloride 05/13/2024 97 (L)  98 - 111 mmol/L Final   CO2 05/13/2024 30  22 - 32 mmol/L Final   Glucose, Bld 05/13/2024 103 (H)  70 - 99 mg/dL Final   Glucose reference range applies only to samples taken after fasting for at least 8 hours.   BUN 05/13/2024 8  6 - 20 mg/dL Final   Creatinine, Ser 05/13/2024 0.82  0.61 - 1.24 mg/dL Final   Calcium  05/13/2024 9.0  8.9 - 10.3 mg/dL Final   Total Protein 92/81/7974 7.1  6.5 - 8.1 g/dL Final    Albumin 92/81/7974 4.1  3.5 - 5.0 g/dL Final   AST 92/81/7974 23  15 - 41 U/L Final   ALT 05/13/2024 21  0 - 44 U/L Final   Alkaline Phosphatase 05/13/2024 80  38 - 126 U/L Final   Total Bilirubin 05/13/2024 0.8  0.0 - 1.2 mg/dL Final   GFR, Estimated 05/13/2024 >60  >60 mL/min Final   Comment: (NOTE) Calculated using the CKD-EPI Creatinine Equation (2021)    Anion gap 05/13/2024 11  5 - 15 Final   Performed at [facility], [address] [place], [place] [address]   Magnesium  05/13/2024 1.7  1.7 - 2.4 mg/dL Final   Performed at [facility], [address], [place], [place] [address]   Alcohol, Ethyl (B) 05/13/2024 52 (H)  <15 mg/dL Final   Comment: (NOTE) For medical purposes only. Performed at [facility], [address] [place], [place] [address]    POC Amphetamine UR 05/13/2024 None Detected  NONE DETECTED (Cut Off Level 1000 ng/mL) Final   POC Secobarbital (BAR) 05/13/2024 None Detected  NONE DETECTED (Cut Off Level 300 ng/mL) Final   POC Buprenorphine (BUP) 05/13/2024 None Detected  NONE DETECTED (Cut Off Level 10 ng/mL) Final   POC Oxazepam (BZO) 05/13/2024 None Detected  NONE DETECTED (Cut Off Level 300 ng/mL) Final   POC Cocaine UR 05/13/2024 None Detected  NONE DETECTED (Cut Off Level 300 ng/mL) Final   POC Methamphetamine UR 05/13/2024 None Detected  NONE DETECTED (Cut Off Level 1000 ng/mL) Final   POC Morphine  05/13/2024 None Detected  NONE DETECTED (Cut Off Level 300 ng/mL) Final   POC Methadone UR 05/13/2024 None Detected  NONE DETECTED (Cut Off Level 300 ng/mL) Final   POC Oxycodone  UR 05/13/2024 None Detected  NONE DETECTED (Cut Off Level 100 ng/mL) Final   POC Marijuana UR 05/13/2024 None Detected  NONE DETECTED (Cut Off Level 50 ng/mL) Final  Admission on 01/23/2024, Discharged on 02/02/2024  Component Date Value Ref Range Status   Sodium 01/30/2024 137  135 - 145 mmol/L Final   Potassium 01/30/2024 4.1  3.5 - 5.1 mmol/L Final   Chloride  01/30/2024 101  98 - 111 mmol/L Final   CO2 01/30/2024 30  22 - 32 mmol/L Final   Glucose, Bld 01/30/2024 92  70 - 99 mg/dL Final   Glucose reference range applies only to samples taken after fasting for at least 8 hours.   BUN 01/30/2024 17  6 - 20 mg/dL Final   Creatinine, Ser 01/30/2024 1.03  0.61 - 1.24 mg/dL Final   Calcium  01/30/2024 8.9  8.9 - 10.3 mg/dL Final   Total Protein 95/94/7974 7.2  6.5 - 8.1 g/dL Final   Albumin 95/94/7974 4.1  3.5 - 5.0 g/dL Final   AST 95/94/7974 18  15 - 41 U/L Final   ALT 01/30/2024 19  0 - 44 U/L Final   Alkaline Phosphatase 01/30/2024 43  38 - 126 U/L Final   Total Bilirubin 01/30/2024 0.6  0.0 - 1.2 mg/dL Final   GFR, Estimated 01/30/2024 >60  >60 mL/min Final   Comment: (NOTE) Calculated using the CKD-EPI Creatinine Equation (2021)    Anion gap 01/30/2024 6  5 - 15 Final   Performed at [facility], [address], [place], [place] [address]  Admission on 01/22/2024, Discharged on 01/23/2024  Component Date Value Ref Range Status   WBC 01/22/2024 6.8  4.0 - 10.5 K/uL Final   RBC 01/22/2024 5.22  4.22 - 5.81 MIL/uL Final   Hemoglobin 01/22/2024 17.2 (H)  13.0 - 17.0 g/dL Final   HCT 96/71/7974 47.8  39.0 - 52.0 % Final   MCV 01/22/2024 91.6  80.0 - 100.0 fL Final   MCH 01/22/2024 33.0  26.0 - 34.0 pg Final   MCHC 01/22/2024 36.0  30.0 - 36.0 g/dL Final   RDW 96/71/7974 12.1  11.5 - 15.5 % Final   Platelets 01/22/2024 319  150 - 400 K/uL Final   nRBC 01/22/2024 0.0  0.0 - 0.2 % Final   Neutrophils Relative % 01/22/2024 70  % Final   Neutro Abs 01/22/2024 4.8  1.7 - 7.7 K/uL Final   Lymphocytes Relative 01/22/2024 25  % Final   Lymphs Abs 01/22/2024 1.7  0.7 - 4.0 K/uL Final   Monocytes Relative 01/22/2024 4  % Final   Monocytes Absolute 01/22/2024 0.3  0.1 - 1.0 K/uL Final   Eosinophils Relative 01/22/2024 0  % Final   Eosinophils Absolute 01/22/2024 0.0  0.0 - 0.5 K/uL Final   Basophils Relative 01/22/2024 1  % Final   Basophils  Absolute 01/22/2024 0.1  0.0 - 0.1 K/uL Final   Immature Granulocytes 01/22/2024 0  % Final   Abs Immature Granulocytes 01/22/2024 0.02  0.00 - 0.07 K/uL Final   Performed at [facility], [address] [place], [place] [address]   Sodium 01/22/2024 138  135 - 145 mmol/L Final   Potassium 01/22/2024 3.9  3.5 - 5.1 mmol/L Final   Chloride 01/22/2024 99  98 - 111 mmol/L Final   CO2 01/22/2024 25  22 - 32 mmol/L Final   Glucose, Bld 01/22/2024 99  70 - 99 mg/dL Final   Glucose reference range applies only to samples taken after fasting for at least 8 hours.   BUN 01/22/2024 13  6 - 20 mg/dL Final   Creatinine, Ser 01/22/2024 0.83  0.61 - 1.24 mg/dL Final   Calcium  01/22/2024 8.9  8.9 - 10.3 mg/dL Final   Total Protein 96/71/7974 7.5  6.5 - 8.1 g/dL Final   Albumin 96/71/7974 4.6  3.5 - 5.0 g/dL Final   AST 96/71/7974 44 (H)  15 - 41 U/L Final   ALT 01/22/2024 46 (H)  0 - 44 U/L Final   Alkaline Phosphatase 01/22/2024 60  38 - 126 U/L Final   Total Bilirubin 01/22/2024 0.8  0.0 - 1.2 mg/dL Final   GFR, Estimated 01/22/2024 >60  >60 mL/min Final   Comment: (NOTE) Calculated using the CKD-EPI Creatinine Equation (2021)    Anion gap 01/22/2024 14  5 - 15 Final   Performed at [facility], [address] [place], [place] [address]   Hgb A1c MFr Bld 01/22/2024 5.0  4.8 - 5.6 % Final   Comment: (  NOTE) Pre diabetes:          5.7%-6.4%  Diabetes:              >6.4%  Glycemic control for   <7.0% adults with diabetes    Mean Plasma Glucose 01/22/2024 96.8  mg/dL Final   Performed at [facility], [address] [place], [place] [address]   Magnesium  01/22/2024 1.9  1.7 - 2.4 mg/dL Final   Performed at [facility], [address], [place], [place] [address]   Alcohol, Ethyl (B) 01/22/2024 109 (H)  <10 mg/dL Final   Comment: (NOTE) Lowest detectable limit for serum alcohol is 10 mg/dL.  For medical purposes only. Performed at [facility], [address], [place], [place] [address]    Cholesterol 01/22/2024 202 (H)  0 - 200 mg/dL Final   Triglycerides 96/71/7974 65  <150 mg/dL Final   HDL 96/71/7974 84  >40 mg/dL Final   Total CHOL/HDL Ratio 01/22/2024 2.4  RATIO Final   VLDL 01/22/2024 13  0 - 40 mg/dL Final   LDL Cholesterol 01/22/2024 105 (H)  0 - 99 mg/dL Final   Comment:        Total Cholesterol/HDL:CHD Risk Coronary Heart Disease Risk Table                     Men   Women  1/2 Average Risk   3.4   3.3  Average Risk       5.0   4.4  2 X Average Risk   9.6   7.1  3 X Average Risk  23.4   11.0        Use the calculated Patient Ratio above and the CHD Risk Table to determine the patient's CHD Risk.        ATP III CLASSIFICATION (LDL):  <100     mg/dL   Optimal  899-870  mg/dL   Near or Above                    Optimal  130-159  mg/dL   Borderline  839-810  mg/dL   High  >809     mg/dL   Very High Performed at [facility], [address] [place], [place] [address]    TSH 01/22/2024 0.458  0.350 - 4.500 uIU/mL Final   Comment: Performed by a 3rd Generation assay with a functional sensitivity of <=0.01 uIU/mL. Performed at [facility], [address] [place], [place] [address]    RPR Ser Ql 01/22/2024 NON REACTIVE  NON REACTIVE Final   Performed at [facility], [address] [place], [place] [address]   Color, Urine 01/22/2024 YELLOW  YELLOW Final   APPearance 01/22/2024 CLEAR  CLEAR Final   Specific Gravity, Urine 01/22/2024 1.020  1.005 - 1.030 Final   pH 01/22/2024 5.0  5.0 - 8.0 Final   Glucose, UA 01/22/2024 NEGATIVE  NEGATIVE mg/dL Final   Hgb urine dipstick 01/22/2024 NEGATIVE  NEGATIVE Final   Bilirubin Urine 01/22/2024 NEGATIVE  NEGATIVE Final   Ketones, ur 01/22/2024 5 (A)  NEGATIVE mg/dL Final   Protein, ur 96/71/7974 NEGATIVE  NEGATIVE mg/dL Final   Nitrite 96/71/7974 NEGATIVE  NEGATIVE Final   Leukocytes,Ua 01/22/2024 NEGATIVE  NEGATIVE Final   Performed at [facility]  Lab, [address], [place], [place] [address]   POC Amphetamine UR 01/22/2024 None Detected   Final  POC Secobarbital (BAR) 01/22/2024 None Detected   Final   POC Buprenorphine (BUP) 01/22/2024 None Detected   Final   POC Oxazepam (BZO) 01/22/2024 None Detected   Final   POC Cocaine UR 01/22/2024 None Detected   Final   POC Methamphetamine UR 01/22/2024 None Detected   Final   POC Morphine  01/22/2024 None Detected   Final   POC Methadone UR 01/22/2024 None Detected   Final   POC Oxycodone  UR 01/22/2024 None Detected   Final   POC Marijuana UR 01/22/2024 None Detected   Final    Blood Alcohol level:  Lab Results  Component Value Date   ETH 356 (HH) 07/10/2024   ETH 52 (H) 05/13/2024    Metabolic Disorder Labs: Lab Results  Component Value Date   HGBA1C 4.8 05/15/2024   MPG 91.06 05/15/2024   MPG 96.8 01/22/2024   No results found for: PROLACTIN Lab Results  Component Value Date   CHOL 187 05/15/2024   TRIG 186 (H) 05/15/2024   HDL 59 05/15/2024   CHOLHDL 3.2 05/15/2024   VLDL 37 05/15/2024   LDLCALC 91 05/15/2024   LDLCALC 105 (H) 01/22/2024    Therapeutic Lab Levels: No results found for: LITHIUM No results found for: VALPROATE No results found for: CBMZ  Physical Findings   AUDIT    Flowsheet Row ED from 05/13/2024 in [facility] ED from 01/23/2024 in [facility]  Alcohol Use Disorder Identification Test Final Score (AUDIT) 26 36   GAD-7    Flowsheet Row Counselor from 10/22/2023 in [facility]  Total GAD-7 Score 8   PHQ2-9    Flowsheet Row ED from 07/10/2024 in [facility] ED from 05/13/2024 in [facility] ED from 01/23/2024 in [facility] ED from 01/22/2024 in [facility] Counselor from 10/22/2023 in [facility]  PHQ-2 Total  Score 2 0 3 4 0  PHQ-9 Total Score 7 9 14 12  --   Flowsheet Row ED from 07/10/2024 in [facility] Most recent reading at 07/10/2024  4:50 PM ED from 07/10/2024 in [facility] Emergency Department at [facility] Most recent reading at 07/10/2024  3:07 AM ED from 05/13/2024 in [facility] Most recent reading at 05/13/2024  4:39 PM  C-SSRS RISK CATEGORY No Risk Low Risk No Risk     Musculoskeletal  Strength & Muscle Tone: within normal limits Gait & Station: normal Patient leans: N/A  Psychiatric Specialty Exam  Presentation  General Appearance:  Casual  Eye Contact: Good  Speech: Normal Rate  Speech Volume: Normal  Handedness: Right   Mood and Affect  Mood: Anxious  Affect: Congruent   Thought Process  Thought Processes: Linear  Descriptions of Associations:Intact  Orientation:Full (Time, Place and Person)  Thought Content:Logical  Diagnosis of Schizophrenia or Schizoaffective disorder in past: No    Hallucinations:Hallucinations: None  Ideas of Reference:None  Suicidal Thoughts:Suicidal Thoughts: No  Homicidal Thoughts:Homicidal Thoughts: No   Sensorium  Memory: Immediate Good; Recent Fair  Judgment: Fair  Insight: Fair   Executive Functions  Concentration: Good  Attention Span: Good  Recall: Good  Fund of Knowledge: Good  Language: Good   Psychomotor Activity  Psychomotor Activity: Psychomotor Activity: Normal   Assets  Assets: Communication Skills; Desire for Improvement; Leisure Time   Sleep  Sleep: Sleep: Good  Estimated Sleeping Duration (Last 24  Hours): 6.50-7.75 hours  No data recorded  Physical Exam  Physical Exam Vitals and nursing note reviewed.  HENT:     Head: Normocephalic and atraumatic.  Eyes:     Extraocular Movements: Extraocular movements intact.  Pulmonary:     Effort: Pulmonary effort is normal.  Musculoskeletal:         General: Normal range of motion.     Cervical back: Normal range of motion.  Neurological:     General: No focal deficit present.     Mental Status: He is oriented to person, place, and time.  Psychiatric:        Behavior: Behavior normal.    Review of Systems  Constitutional:  Negative for diaphoresis.  Gastrointestinal:  Negative for constipation, diarrhea, nausea and vomiting.  Genitourinary:  Negative for dysuria.  Musculoskeletal:  Negative for joint pain and myalgias.  Neurological:  Positive for tremors.  Psychiatric/Behavioral:  Negative for hallucinations and suicidal ideas.    Blood pressure 125/85, pulse 74, temperature 98.8 F (37.1 C), temperature source Oral, resp. rate 17, SpO2 98%. There is no height or weight on file to calculate BMI.  Treatment Plan Summary: Long Term Goals: Improvement in symptoms so as ready for discharge   Short Term Goals: Patient will verbalize feelings in meetings with treatment team members., Patient will attend at least of 50% of the groups daily., Pt will complete the PHQ9 on admission, day 3 and discharge., and Patient will take medications as prescribed daily.   Medications: Mood/anxiety: continue group therapy, milieu therapy, 1:1 evaluation with provider.  Medication management:  Remeron  7.5mg  PO at bedtime for mood, anxiety, sleep and appetite  Substance Abuse:  Severe alcohol use disorder: replacing thiamine . Encourage adequate PO intake. May require lab monitoring of electrolytes. Benzodiazepine taper as tolerated. Seizure precautions. Monitor for delirium tremens.  Nicotine and tobacco use: N/A Medical: PRNs for pain, constipation, indigestion available.  Labs/studies: no new labs Safety and Monitoring: voluntarily admission to [facility]/Facility based care unit [facility]) unit for safety, stabilization and treatment Daily contact with patient to assess and evaluate symptoms and progress in treatment Patient's case to be discussed in  multi-disciplinary team meeting Observation Level : q15 minute checks Vital signs: q12 hours Precautions: withdrawal   Based on my evaluation the patient does not appear to have an emergency medical condition.   Corean Anette Potters, MD 07/13/2024 1:40 PM

## 2024-07-13 NOTE — Group Note (Signed)
 Group Topic: Relapse and Recovery  Group Date: 07/13/2024 Start Time: 2000 End Time: 2100 Facilitators: Joan Plowman B  Department: [facility]  Number of Participants: 9  Group Focus: abuse issues, activities of daily living skills, chemical dependency education, chemical dependency issues, community group, coping skills, daily focus, diagnosis education, goals/reality orientation, problem solving, relapse prevention, and substance abuse education Treatment Modality:  Psychoeducation and Spiritual Interventions utilized were leisure development, patient education, story telling, and support Purpose: enhance coping skills, explore maladaptive thinking, express feelings, increase insight, relapse prevention strategies, and trigger / craving management  Name: Trevor [name] Date of Birth: April 15, 1981  MR: [id number]    Level of Participation: active Quality of Participation: attentive and cooperative Interactions with others: gave feedback Mood/Affect: appropriate Triggers (if applicable): NA Cognition: coherent/clear Progress: Gaining insight Response: NA Plan: patient will be encouraged to go to groups  Patients Problems:  Patient Active Problem List   Diagnosis Date Noted   MDD (major depressive disorder) 07/10/2024   Moderate episode of recurrent major depressive disorder (HCC) 01/30/2024   GAD (generalized anxiety disorder) 01/30/2024   Transaminitis 06/18/2022   GERD (gastroesophageal reflux disease) 06/18/2022   Hypokalemia 06/18/2022   Chronic hepatitis C without hepatic coma (HCC) 06/28/2019   Psoriasis 06/07/2019   H/O withdrawal symptoms, alcohol, with delirium and seizures (HCC) 06/07/2019   Alcohol use disorder, severe, dependence (HCC) 06/03/2019   Obesity (BMI 30-39.9) 01/09/2016   HSV-2 (herpes simplex virus 2) infection

## 2024-07-13 NOTE — ED Notes (Signed)
 Pt is sleeping. No acute distress noted.

## 2024-07-13 NOTE — Group Note (Signed)
 Group Topic: Relapse and Recovery  Group Date: 07/13/2024 Start Time: 1200 End Time: 1220 Facilitators: Stanly Stabile, RN  Department: [facility]  Number of Participants: 7  Group Focus: coping skills Treatment Modality:  Behavior Modification Therapy Interventions utilized were patient education Purpose: enhance coping skills, explore maladaptive thinking, increase insight, and regain self-worth  Name: Trevor [name] Date of Birth: 1981/08/19  MR: [id number]    Level of Participation: moderate Quality of Participation: attentive and cooperative Interactions with others: gave feedback Mood/Affect: appropriate Triggers (if applicable):   Cognition: coherent/clear Progress: Moderate Response:   Plan: follow-up needed  Patients Problems:  Patient Active Problem List   Diagnosis Date Noted   MDD (major depressive disorder) 07/10/2024   Moderate episode of recurrent major depressive disorder (HCC) 01/30/2024   GAD (generalized anxiety disorder) 01/30/2024   Transaminitis 06/18/2022   GERD (gastroesophageal reflux disease) 06/18/2022   Hypokalemia 06/18/2022   Chronic hepatitis C without hepatic coma (HCC) 06/28/2019   Psoriasis 06/07/2019   H/O withdrawal symptoms, alcohol, with delirium and seizures (HCC) 06/07/2019   Alcohol use disorder, severe, dependence (HCC) 06/03/2019   Obesity (BMI 30-39.9) 01/09/2016   HSV-2 (herpes simplex virus 2) infection

## 2024-07-13 NOTE — ED Notes (Signed)
 Awake and alert on unit without issue or complaint.  Patient calm and quiet no withdrawal at this time.  Denies avh shi or plan.  Will monitor.

## 2024-07-14 DIAGNOSIS — F419 Anxiety disorder, unspecified: Secondary | ICD-10-CM | POA: Diagnosis not present

## 2024-07-14 DIAGNOSIS — F332 Major depressive disorder, recurrent severe without psychotic features: Secondary | ICD-10-CM | POA: Diagnosis not present

## 2024-07-14 DIAGNOSIS — R45851 Suicidal ideations: Secondary | ICD-10-CM | POA: Diagnosis not present

## 2024-07-14 DIAGNOSIS — F1029 Alcohol dependence with unspecified alcohol-induced disorder: Secondary | ICD-10-CM | POA: Diagnosis not present

## 2024-07-14 MED ORDER — MIRTAZAPINE 15 MG PO TABS
15.0000 mg | ORAL_TABLET | Freq: Every day | ORAL | Status: DC
  Administered 2024-07-14 – 2024-07-17 (×4): 15 mg via ORAL
  Filled 2024-07-14 (×4): qty 1

## 2024-07-14 NOTE — Group Note (Signed)
 Group Topic: Identity and Relationships  Group Date: 07/12/2024 Start Time: 0800 End Time: 0845 Facilitators: Lonzell Dwayne RAMAN, NT  Department: [facility]  Number of Participants: 2  Group Focus: self-awareness Treatment Modality:  Patient-Centered Therapy Interventions utilized were reminiscence Purpose: relapse prevention strategies  Name: Trevor [name] Date of Birth: 07-15-1981  MR: [id number]    Level of Participation: Patient attended group Quality of Participation: attentive Interactions with others: gave feedback Mood/Affect: positive Triggers (if applicable): N/A Cognition: coherent/clear Progress: Gaining insight Response: Appropriate  Plan: follow-up needed  Patients Problems:  Patient Active Problem List   Diagnosis Date Noted   MDD (major depressive disorder) 07/10/2024   Moderate episode of recurrent major depressive disorder (HCC) 01/30/2024   GAD (generalized anxiety disorder) 01/30/2024   Transaminitis 06/18/2022   GERD (gastroesophageal reflux disease) 06/18/2022   Hypokalemia 06/18/2022   Chronic hepatitis C without hepatic coma (HCC) 06/28/2019   Psoriasis 06/07/2019   H/O withdrawal symptoms, alcohol, with delirium and seizures (HCC) 06/07/2019   Alcohol use disorder, severe, dependence (HCC) 06/03/2019   Obesity (BMI 30-39.9) 01/09/2016   HSV-2 (herpes simplex virus 2) infection

## 2024-07-14 NOTE — Group Note (Signed)
 Group Topic: Positive Affirmations  Group Date: 07/13/2024 Start Time: 0815 End Time: 0850 Facilitators: Lonzell Dwayne RAMAN, NT  Department: [facility]  Number of Participants: 3  Group Focus: feeling awareness/expression Treatment Modality:  Psychoeducation Interventions utilized were support Purpose: reinforce self-care  Name: Trevor [name] Date of Birth: 05-26-1981  MR: [id number]    Level of Participation: Patient did attend group Quality of Participation: attentive Interactions with others: gave feedback Mood/Affect: positive  Triggers (if applicable): N/A Cognition: coherent/clear Progress: Gaining insight Response: Appropriate  Plan: follow-up needed  Patients Problems:  Patient Active Problem List   Diagnosis Date Noted   MDD (major depressive disorder) 07/10/2024   Moderate episode of recurrent major depressive disorder (HCC) 01/30/2024   GAD (generalized anxiety disorder) 01/30/2024   Transaminitis 06/18/2022   GERD (gastroesophageal reflux disease) 06/18/2022   Hypokalemia 06/18/2022   Chronic hepatitis C without hepatic coma (HCC) 06/28/2019   Psoriasis 06/07/2019   H/O withdrawal symptoms, alcohol, with delirium and seizures (HCC) 06/07/2019   Alcohol use disorder, severe, dependence (HCC) 06/03/2019   Obesity (BMI 30-39.9) 01/09/2016   HSV-2 (herpes simplex virus 2) infection

## 2024-07-14 NOTE — ED Provider Notes (Signed)
 Behavioral Health Progress Note  Date and Time: 07/14/2024 2:17 PM Name: Trevor [name] MRN:  [id number]  Subjective:  Trevor [name] was seen in his room on rounds today. He feels that the withdrawal is improving. He is not sleeping so well. His appetite is okay. No new physical complaints. He has been having more anxiety lately, but hydroxyzine  has been helping. He is able to discuss discharge planning  Diagnosis:  Final diagnoses:  Alcohol use disorder  Severe episode of recurrent major depressive disorder, without psychotic features (HCC)    Total Time spent with patient: 15 minutes  Past Psychiatric History: Alcohol use disorder  Past Medical History: Hep C Family History: Grandparents had alcohol problems. Brother is recovered from alcohol Social History: Divorced. Lives with his mother. Has a 22 year-old daughter.    Sleep: Poor  Appetite:  Good  Current Medications:  Current Facility-Administered Medications  Medication Dose Route Frequency Provider Last Rate Last Admin   acetaminophen  (TYLENOL ) tablet 650 mg  650 mg Oral Q6H PRN Randall Starlyn HERO, NP   650 mg at 07/12/24 1251   alum & mag hydroxide-simeth (MAALOX/MYLANTA) 200-200-20 MG/5ML suspension 30 mL  30 mL Oral Q4H PRN Motley-Mangrum, Jadeka A, PMHNP       haloperidol  (HALDOL ) tablet 5 mg  5 mg Oral TID PRN Motley-Mangrum, Jadeka A, PMHNP       And   diphenhydrAMINE  (BENADRYL ) capsule 50 mg  50 mg Oral TID PRN Motley-Mangrum, Jadeka A, PMHNP   50 mg at 07/11/24 1110   haloperidol  lactate (HALDOL ) injection 5 mg  5 mg Intramuscular TID PRN Motley-Mangrum, Jadeka A, PMHNP       And   diphenhydrAMINE  (BENADRYL ) injection 50 mg  50 mg Intramuscular TID PRN Motley-Mangrum, Jadeka A, PMHNP       And   LORazepam  (ATIVAN ) injection 2 mg  2 mg Intramuscular TID PRN Motley-Mangrum, Jadeka A, PMHNP       haloperidol  lactate (HALDOL ) injection 10 mg  10 mg Intramuscular TID PRN Motley-Mangrum, Jadeka A, PMHNP       And    diphenhydrAMINE  (BENADRYL ) injection 50 mg  50 mg Intramuscular TID PRN Motley-Mangrum, Jadeka A, PMHNP       And   LORazepam  (ATIVAN ) injection 2 mg  2 mg Intramuscular TID PRN Motley-Mangrum, Jadeka A, PMHNP       folic acid  (FOLVITE ) tablet 1 mg  1 mg Oral Daily Motley-Mangrum, Jadeka A, PMHNP   1 mg at 07/14/24 9090   hydrOXYzine  (ATARAX ) tablet 25 mg  25 mg Oral TID PRN Leigh Corean Massa, MD   25 mg at 07/14/24 9090   LORazepam  (ATIVAN ) tablet 0.5 mg  0.5 mg Oral QHS Abriana Saltos, Corean Massa, MD       magnesium  hydroxide (MILK OF MAGNESIA) suspension 30 mL  30 mL Oral Daily PRN Motley-Mangrum, Jadeka A, PMHNP       mirtazapine  (REMERON ) tablet 7.5 mg  7.5 mg Oral QHS Esteban Kobashigawa, Corean Massa, MD   7.5 mg at 07/13/24 2114   multivitamin with minerals tablet 1 tablet  1 tablet Oral Daily Motley-Mangrum, Jadeka A, PMHNP   1 tablet at 07/14/24 9090   thiamine  (VITAMIN B1) tablet 100 mg  100 mg Oral Daily Motley-Mangrum, Jadeka A, PMHNP   100 mg at 07/14/24 0909   Or   thiamine  (VITAMIN B1) injection 100 mg  100 mg Intravenous Daily Motley-Mangrum, Jadeka A, PMHNP       Current Outpatient Medications  Medication Sig Dispense Refill  hydrOXYzine  (ATARAX ) 25 MG tablet Take 1 tablet (25 mg total) by mouth every 6 (six) hours as needed for anxiety. 60 tablet 0   Multiple Vitamin (MULTIVITAMIN WITH MINERALS) TABS tablet Take 1 tablet by mouth daily.     naltrexone  380 MG SUSR IM injection Inject 380 mg into the muscle every 28 (twenty-eight) days.     sertraline  (ZOLOFT ) 50 MG tablet Take 50 mg by mouth daily.     valACYclovir  (VALTREX ) 500 MG tablet Take 500 mg by mouth daily as needed (For outbreaks).      Labs  Lab Results:  Admission on 07/10/2024, Discharged on 07/10/2024  Component Date Value Ref Range Status   Sodium 07/10/2024 139  135 - 145 mmol/L Final   Potassium 07/10/2024 3.4 (L)  3.5 - 5.1 mmol/L Final   Chloride 07/10/2024 102  98 - 111 mmol/L Final   CO2 07/10/2024 23  22 - 32  mmol/L Final   Glucose, Bld 07/10/2024 111 (H)  70 - 99 mg/dL Final   Glucose reference range applies only to samples taken after fasting for at least 8 hours.   BUN 07/10/2024 15  6 - 20 mg/dL Final   Creatinine, Ser 07/10/2024 0.80  0.61 - 1.24 mg/dL Final   Calcium  07/10/2024 8.2 (L)  8.9 - 10.3 mg/dL Final   Total Protein 90/85/7974 7.8  6.5 - 8.1 g/dL Final   Albumin 90/85/7974 4.4  3.5 - 5.0 g/dL Final   AST 90/85/7974 29  15 - 41 U/L Final   ALT 07/10/2024 33  0 - 44 U/L Final   Alkaline Phosphatase 07/10/2024 54  38 - 126 U/L Final   Total Bilirubin 07/10/2024 0.7  0.0 - 1.2 mg/dL Final   GFR, Estimated 07/10/2024 >60  >60 mL/min Final   Comment: (NOTE) Calculated using the CKD-EPI Creatinine Equation (2021)    Anion gap 07/10/2024 14  5 - 15 Final   Performed at [facility], [address] [place], [place] [address]   Alcohol, Ethyl (B) 07/10/2024 356 (HH)  <15 mg/dL Final   Comment: CRITICAL RESULT CALLED TO, READ BACK BY AND VERIFIED WITH PRUITT,G ON 07/10/24 AT 0352 BY PURDIE,J (NOTE) For medical purposes only. Performed at [facility], [address], [place], [place] [address]    WBC 07/10/2024 8.4  4.0 - 10.5 K/uL Final   RBC 07/10/2024 5.16  4.22 - 5.81 MIL/uL Final   Hemoglobin 07/10/2024 16.7  13.0 - 17.0 g/dL Final   HCT 90/85/7974 46.7  39.0 - 52.0 % Final   MCV 07/10/2024 90.5  80.0 - 100.0 fL Final   MCH 07/10/2024 32.4  26.0 - 34.0 pg Final   MCHC 07/10/2024 35.8  30.0 - 36.0 g/dL Final   RDW 90/85/7974 12.8  11.5 - 15.5 % Final   Platelets 07/10/2024 344  150 - 400 K/uL Final   nRBC 07/10/2024 0.0  0.0 - 0.2 % Final   Performed at [facility], [address] [place], [place] [address]   Opiates 07/10/2024 NONE DETECTED  NONE DETECTED Final   Cocaine 07/10/2024 NONE DETECTED  NONE DETECTED Final   Benzodiazepines 07/10/2024 NONE DETECTED  NONE DETECTED Final   Amphetamines 07/10/2024 NONE DETECTED  NONE DETECTED Final   Tetrahydrocannabinol 07/10/2024  NONE DETECTED  NONE DETECTED Final   Barbiturates 07/10/2024 NONE DETECTED  NONE DETECTED Final   Comment: (NOTE) DRUG SCREEN FOR MEDICAL PURPOSES ONLY.  IF CONFIRMATION IS NEEDED FOR ANY PURPOSE, NOTIFY LAB WITHIN 5 DAYS.  LOWEST  DETECTABLE LIMITS FOR URINE DRUG SCREEN Drug Class                     Cutoff (ng/mL) Amphetamine and metabolites    1000 Barbiturate and metabolites    200 Benzodiazepine                 200 Opiates and metabolites        300 Cocaine and metabolites        300 THC                            50 Performed at [facility], [address] [place], [place] [address]    Magnesium  07/10/2024 2.2  1.7 - 2.4 mg/dL Final   Performed at [facility], [address] [place], [place] [address]  Admission on 05/13/2024, Discharged on 05/20/2024  Component Date Value Ref Range Status   Hgb A1c MFr Bld 05/15/2024 4.8  4.8 - 5.6 % Final   Comment: (NOTE) Diagnosis of Diabetes The following HbA1c ranges recommended by the American Diabetes Association (ADA) may be used as an aid in the diagnosis of diabetes mellitus.  Hemoglobin             Suggested A1C NGSP%              Diagnosis  <5.7                   Non Diabetic  5.7-6.4                Pre-Diabetic  >6.4                   Diabetic  <7.0                   Glycemic control for                       adults with diabetes.     Mean Plasma Glucose 05/15/2024 91.06  mg/dL Final   Performed at [facility], [address] [place], [place] [address]   Cholesterol 05/15/2024 187  0 - 200 mg/dL Final   Triglycerides 92/79/7974 186 (H)  <150 mg/dL Final   HDL 92/79/7974 59  >40 mg/dL Final   Total CHOL/HDL Ratio 05/15/2024 3.2  RATIO Final   VLDL 05/15/2024 37  0 - 40 mg/dL Final   LDL Cholesterol 05/15/2024 91  0 - 99 mg/dL Final   Comment:        Total Cholesterol/HDL:CHD Risk Coronary Heart Disease Risk Table                     Men   Women  1/2 Average Risk   3.4   3.3  Average Risk       5.0    4.4  2 X Average Risk   9.6   7.1  3 X Average Risk  23.4   11.0        Use the calculated Patient Ratio above and the CHD Risk Table to determine the patient's CHD Risk.        ATP III CLASSIFICATION (LDL):  <100     mg/dL   Optimal  899-870  mg/dL   Near or Above                    Optimal  130-159  mg/dL   Borderline  839-810  mg/dL   High  >809     mg/dL   Very High Performed at [facility], [address], [place], [place] [address]    Vit D, 25-Hydroxy 05/15/2024 78.38  30 - 100 ng/mL Final   Comment: (NOTE) Vitamin D  deficiency has been defined by the Institute of Medicine  and an Endocrine Society practice guideline as a level of serum 25-OH  vitamin D  less than 20 ng/mL (1,2). The Endocrine Society went on to  further define vitamin D  insufficiency as a level between 21 and 29  ng/mL (2).  1. IOM (Institute of Medicine). 2010. Dietary reference intakes for  calcium  and D. [place]: [employer]. 2. Holick MF, Binkley McKenney, Bischoff-Ferrari HA, et al. Evaluation,  treatment, and prevention of vitamin D  deficiency: an Endocrine  Society clinical practice guideline, JCEM. 2011 Jul; 96(7): 1911-30.  Performed at [facility], [address], [place], [place] [address]    Vitamin B-12 05/15/2024 265  180 - 914 pg/mL Final   Comment: (NOTE) This assay is not validated for testing neonatal or myeloproliferative syndrome specimens for Vitamin B12 levels. Performed at [facility], [address] [place], [place] [address]    Magnesium  05/15/2024 1.9  1.7 - 2.4 mg/dL Final   Performed at [facility], [address], [place], [place] [address]   TSH 05/15/2024 1.255  0.350 - 4.500 uIU/mL Final   Comment: Performed by a 3rd Generation assay with a functional sensitivity of <=0.01 uIU/mL. Performed at [facility], [address] [place], [place] [address]   Admission on 05/13/2024, Discharged on 05/13/2024  Component  Date Value Ref Range Status   WBC 05/13/2024 8.3  4.0 - 10.5 K/uL Final   RBC 05/13/2024 5.19  4.22 - 5.81 MIL/uL Final   Hemoglobin 05/13/2024 16.7  13.0 - 17.0 g/dL Final   HCT 92/81/7974 48.0  39.0 - 52.0 % Final   MCV 05/13/2024 92.5  80.0 - 100.0 fL Final   MCH 05/13/2024 32.2  26.0 - 34.0 pg Final   MCHC 05/13/2024 34.8  30.0 - 36.0 g/dL Final   RDW 92/81/7974 13.5  11.5 - 15.5 % Final   Platelets 05/13/2024 329  150 - 400 K/uL Final   nRBC 05/13/2024 0.0  0.0 - 0.2 % Final   Neutrophils Relative % 05/13/2024 80  % Final   Neutro Abs 05/13/2024 6.6  1.7 - 7.7 K/uL Final   Lymphocytes Relative 05/13/2024 14  % Final   Lymphs Abs 05/13/2024 1.2  0.7 - 4.0 K/uL Final   Monocytes Relative 05/13/2024 5  % Final   Monocytes Absolute 05/13/2024 0.4  0.1 - 1.0 K/uL Final   Eosinophils Relative 05/13/2024 0  % Final   Eosinophils Absolute 05/13/2024 0.0  0.0 - 0.5 K/uL Final   Basophils Relative 05/13/2024 0  % Final   Basophils Absolute 05/13/2024 0.0  0.0 - 0.1 K/uL Final   Immature Granulocytes 05/13/2024 1  % Final   Abs Immature Granulocytes 05/13/2024 0.04  0.00 - 0.07 K/uL Final   Performed at [facility], [address] [place], [place] [address]   Sodium 05/13/2024 138  135 - 145 mmol/L Final   Potassium 05/13/2024 3.9  3.5 - 5.1 mmol/L Final   Chloride 05/13/2024 97 (L)  98 - 111 mmol/L Final   CO2 05/13/2024 30  22 - 32 mmol/L Final  Glucose, Bld 05/13/2024 103 (H)  70 - 99 mg/dL Final   Glucose reference range applies only to samples taken after fasting for at least 8 hours.   BUN 05/13/2024 8  6 - 20 mg/dL Final   Creatinine, Ser 05/13/2024 0.82  0.61 - 1.24 mg/dL Final   Calcium  05/13/2024 9.0  8.9 - 10.3 mg/dL Final   Total Protein 92/81/7974 7.1  6.5 - 8.1 g/dL Final   Albumin 92/81/7974 4.1  3.5 - 5.0 g/dL Final   AST 92/81/7974 23  15 - 41 U/L Final   ALT 05/13/2024 21  0 - 44 U/L Final   Alkaline Phosphatase 05/13/2024 80  38 - 126 U/L Final   Total  Bilirubin 05/13/2024 0.8  0.0 - 1.2 mg/dL Final   GFR, Estimated 05/13/2024 >60  >60 mL/min Final   Comment: (NOTE) Calculated using the CKD-EPI Creatinine Equation (2021)    Anion gap 05/13/2024 11  5 - 15 Final   Performed at [facility], [address] [place], [place] [address]   Magnesium  05/13/2024 1.7  1.7 - 2.4 mg/dL Final   Performed at [facility], [address], [place], [place] [address]   Alcohol, Ethyl (B) 05/13/2024 52 (H)  <15 mg/dL Final   Comment: (NOTE) For medical purposes only. Performed at [facility], [address] [place], [place] [address]    POC Amphetamine UR 05/13/2024 None Detected  NONE DETECTED (Cut Off Level 1000 ng/mL) Final   POC Secobarbital (BAR) 05/13/2024 None Detected  NONE DETECTED (Cut Off Level 300 ng/mL) Final   POC Buprenorphine (BUP) 05/13/2024 None Detected  NONE DETECTED (Cut Off Level 10 ng/mL) Final   POC Oxazepam (BZO) 05/13/2024 None Detected  NONE DETECTED (Cut Off Level 300 ng/mL) Final   POC Cocaine UR 05/13/2024 None Detected  NONE DETECTED (Cut Off Level 300 ng/mL) Final   POC Methamphetamine UR 05/13/2024 None Detected  NONE DETECTED (Cut Off Level 1000 ng/mL) Final   POC Morphine  05/13/2024 None Detected  NONE DETECTED (Cut Off Level 300 ng/mL) Final   POC Methadone UR 05/13/2024 None Detected  NONE DETECTED (Cut Off Level 300 ng/mL) Final   POC Oxycodone  UR 05/13/2024 None Detected  NONE DETECTED (Cut Off Level 100 ng/mL) Final   POC Marijuana UR 05/13/2024 None Detected  NONE DETECTED (Cut Off Level 50 ng/mL) Final  Admission on 01/23/2024, Discharged on 02/02/2024  Component Date Value Ref Range Status   Sodium 01/30/2024 137  135 - 145 mmol/L Final   Potassium 01/30/2024 4.1  3.5 - 5.1 mmol/L Final   Chloride 01/30/2024 101  98 - 111 mmol/L Final   CO2 01/30/2024 30  22 - 32 mmol/L Final   Glucose, Bld 01/30/2024 92  70 - 99 mg/dL Final   Glucose reference range applies only to samples taken after  fasting for at least 8 hours.   BUN 01/30/2024 17  6 - 20 mg/dL Final   Creatinine, Ser 01/30/2024 1.03  0.61 - 1.24 mg/dL Final   Calcium  01/30/2024 8.9  8.9 - 10.3 mg/dL Final   Total Protein 95/94/7974 7.2  6.5 - 8.1 g/dL Final   Albumin 95/94/7974 4.1  3.5 - 5.0 g/dL Final   AST 95/94/7974 18  15 - 41 U/L Final   ALT 01/30/2024 19  0 - 44 U/L Final   Alkaline Phosphatase 01/30/2024 43  38 - 126 U/L Final   Total Bilirubin 01/30/2024 0.6  0.0 - 1.2 mg/dL Final  GFR, Estimated 01/30/2024 >60  >60 mL/min Final   Comment: (NOTE) Calculated using the CKD-EPI Creatinine Equation (2021)    Anion gap 01/30/2024 6  5 - 15 Final   Performed at [facility], [address], [place], [place] [address]  Admission on 01/22/2024, Discharged on 01/23/2024  Component Date Value Ref Range Status   WBC 01/22/2024 6.8  4.0 - 10.5 K/uL Final   RBC 01/22/2024 5.22  4.22 - 5.81 MIL/uL Final   Hemoglobin 01/22/2024 17.2 (H)  13.0 - 17.0 g/dL Final   HCT 96/71/7974 47.8  39.0 - 52.0 % Final   MCV 01/22/2024 91.6  80.0 - 100.0 fL Final   MCH 01/22/2024 33.0  26.0 - 34.0 pg Final   MCHC 01/22/2024 36.0  30.0 - 36.0 g/dL Final   RDW 96/71/7974 12.1  11.5 - 15.5 % Final   Platelets 01/22/2024 319  150 - 400 K/uL Final   nRBC 01/22/2024 0.0  0.0 - 0.2 % Final   Neutrophils Relative % 01/22/2024 70  % Final   Neutro Abs 01/22/2024 4.8  1.7 - 7.7 K/uL Final   Lymphocytes Relative 01/22/2024 25  % Final   Lymphs Abs 01/22/2024 1.7  0.7 - 4.0 K/uL Final   Monocytes Relative 01/22/2024 4  % Final   Monocytes Absolute 01/22/2024 0.3  0.1 - 1.0 K/uL Final   Eosinophils Relative 01/22/2024 0  % Final   Eosinophils Absolute 01/22/2024 0.0  0.0 - 0.5 K/uL Final   Basophils Relative 01/22/2024 1  % Final   Basophils Absolute 01/22/2024 0.1  0.0 - 0.1 K/uL Final   Immature Granulocytes 01/22/2024 0  % Final   Abs Immature Granulocytes 01/22/2024 0.02  0.00 - 0.07 K/uL Final   Performed at [facility], [address], [place], [place] [address]   Sodium 01/22/2024 138  135 - 145 mmol/L Final   Potassium 01/22/2024 3.9  3.5 - 5.1 mmol/L Final   Chloride 01/22/2024 99  98 - 111 mmol/L Final   CO2 01/22/2024 25  22 - 32 mmol/L Final   Glucose, Bld 01/22/2024 99  70 - 99 mg/dL Final   Glucose reference range applies only to samples taken after fasting for at least 8 hours.   BUN 01/22/2024 13  6 - 20 mg/dL Final   Creatinine, Ser 01/22/2024 0.83  0.61 - 1.24 mg/dL Final   Calcium  01/22/2024 8.9  8.9 - 10.3 mg/dL Final   Total Protein 96/71/7974 7.5  6.5 - 8.1 g/dL Final   Albumin 96/71/7974 4.6  3.5 - 5.0 g/dL Final   AST 96/71/7974 44 (H)  15 - 41 U/L Final   ALT 01/22/2024 46 (H)  0 - 44 U/L Final   Alkaline Phosphatase 01/22/2024 60  38 - 126 U/L Final   Total Bilirubin 01/22/2024 0.8  0.0 - 1.2 mg/dL Final   GFR, Estimated 01/22/2024 >60  >60 mL/min Final   Comment: (NOTE) Calculated using the CKD-EPI Creatinine Equation (2021)    Anion gap 01/22/2024 14  5 - 15 Final   Performed at [facility], [address], [place], [place] [address]   Hgb A1c MFr Bld 01/22/2024 5.0  4.8 - 5.6 % Final   Comment: (NOTE) Pre diabetes:          5.7%-6.4%  Diabetes:              >6.4%  Glycemic control for   <7.0% adults with diabetes  Mean Plasma Glucose 01/22/2024 96.8  mg/dL Final   Performed at [facility], [address], [place], [place] [address]   Magnesium  01/22/2024 1.9  1.7 - 2.4 mg/dL Final   Performed at [facility], [address], [place], [place] [address]   Alcohol, Ethyl (B) 01/22/2024 109 (H)  <10 mg/dL Final   Comment: (NOTE) Lowest detectable limit for serum alcohol is 10 mg/dL.  For medical purposes only. Performed at [facility], [address], [place], [place] [address]    Cholesterol 01/22/2024 202 (H)  0 - 200 mg/dL Final   Triglycerides 96/71/7974 65  <150 mg/dL Final   HDL 96/71/7974 84  >40 mg/dL Final   Total  CHOL/HDL Ratio 01/22/2024 2.4  RATIO Final   VLDL 01/22/2024 13  0 - 40 mg/dL Final   LDL Cholesterol 01/22/2024 105 (H)  0 - 99 mg/dL Final   Comment:        Total Cholesterol/HDL:CHD Risk Coronary Heart Disease Risk Table                     Men   Women  1/2 Average Risk   3.4   3.3  Average Risk       5.0   4.4  2 X Average Risk   9.6   7.1  3 X Average Risk  23.4   11.0        Use the calculated Patient Ratio above and the CHD Risk Table to determine the patient's CHD Risk.        ATP III CLASSIFICATION (LDL):  <100     mg/dL   Optimal  899-870  mg/dL   Near or Above                    Optimal  130-159  mg/dL   Borderline  839-810  mg/dL   High  >809     mg/dL   Very High Performed at [facility], [address], [place], [place] [address]    TSH 01/22/2024 0.458  0.350 - 4.500 uIU/mL Final   Comment: Performed by a 3rd Generation assay with a functional sensitivity of <=0.01 uIU/mL. Performed at [facility], [address] [place], [place] [address]    RPR Ser Ql 01/22/2024 NON REACTIVE  NON REACTIVE Final   Performed at [facility], [address] [place], [place] [address]   Color, Urine 01/22/2024 YELLOW  YELLOW Final   APPearance 01/22/2024 CLEAR  CLEAR Final   Specific Gravity, Urine 01/22/2024 1.020  1.005 - 1.030 Final   pH 01/22/2024 5.0  5.0 - 8.0 Final   Glucose, UA 01/22/2024 NEGATIVE  NEGATIVE mg/dL Final   Hgb urine dipstick 01/22/2024 NEGATIVE  NEGATIVE Final   Bilirubin Urine 01/22/2024 NEGATIVE  NEGATIVE Final   Ketones, ur 01/22/2024 5 (A)  NEGATIVE mg/dL Final   Protein, ur 96/71/7974 NEGATIVE  NEGATIVE mg/dL Final   Nitrite 96/71/7974 NEGATIVE  NEGATIVE Final   Leukocytes,Ua 01/22/2024 NEGATIVE  NEGATIVE Final   Performed at [facility], [address] [place], [place] [address]   POC Amphetamine UR 01/22/2024 None Detected   Final   POC Secobarbital (BAR) 01/22/2024 None Detected   Final   POC Buprenorphine (BUP)  01/22/2024 None Detected   Final   POC Oxazepam (BZO) 01/22/2024 None Detected   Final   POC Cocaine UR 01/22/2024 None Detected  Final   POC Methamphetamine UR 01/22/2024 None Detected   Final   POC Morphine  01/22/2024 None Detected   Final   POC Methadone UR 01/22/2024 None Detected   Final   POC Oxycodone  UR 01/22/2024 None Detected   Final   POC Marijuana UR 01/22/2024 None Detected   Final    Blood Alcohol level:  Lab Results  Component Value Date   ETH 356 (HH) 07/10/2024   ETH 52 (H) 05/13/2024    Metabolic Disorder Labs: Lab Results  Component Value Date   HGBA1C 4.8 05/15/2024   MPG 91.06 05/15/2024   MPG 96.8 01/22/2024   No results found for: PROLACTIN Lab Results  Component Value Date   CHOL 187 05/15/2024   TRIG 186 (H) 05/15/2024   HDL 59 05/15/2024   CHOLHDL 3.2 05/15/2024   VLDL 37 05/15/2024   LDLCALC 91 05/15/2024   LDLCALC 105 (H) 01/22/2024    Therapeutic Lab Levels: No results found for: LITHIUM No results found for: VALPROATE No results found for: CBMZ  Physical Findings   AUDIT    Flowsheet Row ED from 05/13/2024 in [facility] ED from 01/23/2024 in [facility]  Alcohol Use Disorder Identification Test Final Score (AUDIT) 26 36   GAD-7    Flowsheet Row Counselor from 10/22/2023 in [facility]  Total GAD-7 Score 8   PHQ2-9    Flowsheet Row ED from 07/10/2024 in [facility] ED from 05/13/2024 in [facility] ED from 01/23/2024 in [facility] ED from 01/22/2024 in [facility] Counselor from 10/22/2023 in [facility]  PHQ-2 Total Score 2 0 3 4 0  PHQ-9 Total Score 7 9 14 12  --   Flowsheet Row ED from 07/10/2024 in [facility] Most recent reading at 07/10/2024  4:50 PM ED from  07/10/2024 in [facility] at [facility] Most recent reading at 07/10/2024  3:07 AM ED from 05/13/2024 in [facility] Most recent reading at 05/13/2024  4:39 PM  C-SSRS RISK CATEGORY No Risk Low Risk No Risk     Musculoskeletal  Strength & Muscle Tone: within normal limits Gait & Station: normal Patient leans: N/A  Psychiatric Specialty Exam  Presentation  General Appearance:  Casual  Eye Contact: Good  Speech: Clear and Coherent  Speech Volume: Normal  Handedness: Right   Mood and Affect  Mood: Euthymic  Affect: Congruent   Thought Process  Thought Processes: Linear; Goal Directed  Descriptions of Associations:Intact  Orientation:Full (Time, Place and Person)  Thought Content:Logical  Diagnosis of Schizophrenia or Schizoaffective disorder in past: No    Hallucinations:Hallucinations: None  Ideas of Reference:None  Suicidal Thoughts:Suicidal Thoughts: No  Homicidal Thoughts:Homicidal Thoughts: No   Sensorium  Memory: Immediate Good; Remote Fair  Judgment: Good  Insight: Good   Executive Functions  Concentration: Good  Attention Span: Good  Recall: Good  Fund of Knowledge: Good  Language: Good   Psychomotor Activity  Psychomotor Activity: Psychomotor Activity: Normal   Assets  Assets: Communication Skills; Desire for Improvement; Physical Health; Resilience; Social Support; Housing   Sleep  Sleep: Sleep: Good  Estimated Sleeping Duration (Last 24 Hours): 6.25-8.00 hours  No data recorded  Physical Exam  Physical Exam Vitals and nursing note reviewed.  Constitutional:      Appearance: Normal appearance.  HENT:  Head: Normocephalic and atraumatic.  Eyes:     Extraocular Movements: Extraocular movements intact.  Pulmonary:     Effort: Pulmonary effort is normal.  Musculoskeletal:        General: Normal range of motion.     Cervical back: Normal  range of motion.  Neurological:     General: No focal deficit present.     Mental Status: He is alert and oriented to person, place, and time.  Psychiatric:        Behavior: Behavior normal.    Review of Systems  Constitutional:  Negative for diaphoresis.  Gastrointestinal:  Negative for diarrhea, nausea and vomiting.  Genitourinary:  Negative for dysuria.  Musculoskeletal:  Negative for joint pain and myalgias.  Neurological:  Negative for tremors.  Psychiatric/Behavioral:  The patient is nervous/anxious.    Blood pressure 109/76, pulse 82, temperature 97.9 F (36.6 C), resp. rate 18, SpO2 98%. There is no height or weight on file to calculate BMI.  Treatment Plan Summary: Long Term Goals: Improvement in symptoms so as ready for discharge   Short Term Goals: Patient will verbalize feelings in meetings with treatment team members., Patient will attend at least of 50% of the groups daily., Pt will complete the PHQ9 on admission, day 3 and discharge., and Patient will take medications as prescribed daily.   Medications: Mood/anxiety: continue group therapy, milieu therapy, 1:1 evaluation with provider.  Medication management:  Increased Remeron  15mg  PO at bedtime for mood, anxiety, sleep and appetite  Substance Abuse:  Severe alcohol use disorder: replacing thiamine . Encourage adequate PO intake. May require lab monitoring of electrolytes. Benzodiazepine taper as tolerated. Seizure precautions. Monitor for delirium tremens.  Nicotine and tobacco use: N/A Medical: PRNs for pain, constipation, indigestion available.  Labs/studies: no new labs Safety and Monitoring: voluntarily admission to [facility]/Facility based care unit [facility]) unit for safety, stabilization and treatment Daily contact with patient to assess and evaluate symptoms and progress in treatment Patient's case to be discussed in multi-disciplinary team meeting Observation Level : q15 minute checks Vital signs: q12  hours Precautions: withdrawal   Based on my evaluation the patient does not appear to have an emergency medical condition.   Corean Anette Potters, MD 07/14/2024 2:17 PM

## 2024-07-14 NOTE — Group Note (Signed)
 Group Topic: Positive Affirmations  Group Date: 07/14/2024 Start Time: 1210 End Time: 1240 Facilitators: Stanly Stabile, RN  Department: [facility]  Number of Participants: 6  Group Focus: affirmation and coping skills Treatment Modality:  Behavior Modification Therapy Interventions utilized were clarification and patient education Purpose: enhance coping skills, explore maladaptive thinking, express feelings, express irrational fears, improve communication skills, increase insight, regain self-worth, reinforce self-care, and relapse prevention strategies  Name: Trevor [name] Date of Birth: Sep 25, 1981  MR: [id number]    Level of Participation: moderate Quality of Participation: attentive and cooperative Interactions with others: gave feedback Mood/Affect: appropriate Triggers (if applicable):   Cognition: coherent/clear Progress: Gaining insight Response:   Plan: follow-up needed  Patients Problems:  Patient Active Problem List   Diagnosis Date Noted   MDD (major depressive disorder) 07/10/2024   Moderate episode of recurrent major depressive disorder (HCC) 01/30/2024   GAD (generalized anxiety disorder) 01/30/2024   Transaminitis 06/18/2022   GERD (gastroesophageal reflux disease) 06/18/2022   Hypokalemia 06/18/2022   Chronic hepatitis C without hepatic coma (HCC) 06/28/2019   Psoriasis 06/07/2019   H/O withdrawal symptoms, alcohol, with delirium and seizures (HCC) 06/07/2019   Alcohol use disorder, severe, dependence (HCC) 06/03/2019   Obesity (BMI 30-39.9) 01/09/2016   HSV-2 (herpes simplex virus 2) infection

## 2024-07-14 NOTE — Group Note (Signed)
 Group Topic: Recovery Basics  Group Date: 07/14/2024 Start Time: 2000 End Time: 2100 Facilitators: Joan Plowman B  Department: [facility]  Number of Participants: 6  Group Focus: abuse issues and check in Treatment Modality:  Individual Therapy Interventions utilized were leisure development, patient education, story telling, and support Purpose: enhance coping skills, express feelings, increase insight, relapse prevention strategies, and trigger / craving management  Name: Trevor [name] Date of Birth: 05/10/1981  MR: [id number]    Level of Participation: active Quality of Participation: attentive and cooperative Interactions with others: gave feedback Mood/Affect: appropriate Triggers (if applicable): NA Cognition: coherent/clear Progress: Gaining insight Response: NA Plan: patient will be encouraged to keep going to groups  Patients Problems:  Patient Active Problem List   Diagnosis Date Noted   MDD (major depressive disorder) 07/10/2024   Moderate episode of recurrent major depressive disorder (HCC) 01/30/2024   GAD (generalized anxiety disorder) 01/30/2024   Transaminitis 06/18/2022   GERD (gastroesophageal reflux disease) 06/18/2022   Hypokalemia 06/18/2022   Chronic hepatitis C without hepatic coma (HCC) 06/28/2019   Psoriasis 06/07/2019   H/O withdrawal symptoms, alcohol, with delirium and seizures (HCC) 06/07/2019   Alcohol use disorder, severe, dependence (HCC) 06/03/2019   Obesity (BMI 30-39.9) 01/09/2016   HSV-2 (herpes simplex virus 2) infection

## 2024-07-14 NOTE — Care Management (Signed)
 [facility] Care Management   Writer informed the patient that he has been accepted to [facility].  He is able to go to the facility once he has completed his detox at the [facility].    The facility reports that his parents are able to transport the patient to the facility.  Patient is in the process of contacting his parents to come and take him to the facility on Monday or Tuesday.

## 2024-07-14 NOTE — ED Notes (Signed)
 Patient is awake on unit sitting in the day room talking with peers and waiting on breakfast.  Patient reports feeling better today.  Denies avh shi or plan.  He is motivated for treatment and is happy about being accepted to a two year treatment program.  Will continue to monitor and provide supportive environment.

## 2024-07-14 NOTE — ED Notes (Signed)
 Pt is sleeping, no acute distress noted. Q15 safety checks in place.

## 2024-07-14 NOTE — Group Note (Signed)
 Group Topic: Overcoming Obstacles  Group Date: 07/14/2024 Start Time: 0815 End Time: 0900 Facilitators: Lonzell Dwayne RAMAN, NT  Department: [facility]  Number of Participants: 3  Group Focus: goals/reality orientation Treatment Modality:  Solution-Focused Therapy Interventions utilized were problem solving Purpose: reinforce self-care  Name: Trevor [name] Date of Birth: Mar 23, 1981  MR: [id number]    Level of Participation: patient did attend groupactive Quality of Participation: attentive Interactions with others: gave feedback Mood/Affect: positive Triggers (if applicable): N/A Cognition: coherent/clear Progress: Gaining insight Response: Appropriate  Plan: follow-up needed  Patients Problems:  Patient Active Problem List   Diagnosis Date Noted   MDD (major depressive disorder) 07/10/2024   Moderate episode of recurrent major depressive disorder (HCC) 01/30/2024   GAD (generalized anxiety disorder) 01/30/2024   Transaminitis 06/18/2022   GERD (gastroesophageal reflux disease) 06/18/2022   Hypokalemia 06/18/2022   Chronic hepatitis C without hepatic coma (HCC) 06/28/2019   Psoriasis 06/07/2019   H/O withdrawal symptoms, alcohol, with delirium and seizures (HCC) 06/07/2019   Alcohol use disorder, severe, dependence (HCC) 06/03/2019   Obesity (BMI 30-39.9) 01/09/2016   HSV-2 (herpes simplex virus 2) infection

## 2024-07-15 DIAGNOSIS — F332 Major depressive disorder, recurrent severe without psychotic features: Secondary | ICD-10-CM | POA: Diagnosis not present

## 2024-07-15 DIAGNOSIS — R45851 Suicidal ideations: Secondary | ICD-10-CM | POA: Diagnosis not present

## 2024-07-15 DIAGNOSIS — F1029 Alcohol dependence with unspecified alcohol-induced disorder: Secondary | ICD-10-CM | POA: Diagnosis not present

## 2024-07-15 DIAGNOSIS — F419 Anxiety disorder, unspecified: Secondary | ICD-10-CM | POA: Diagnosis not present

## 2024-07-15 NOTE — ED Notes (Signed)
 Pt observed in the dayroom, reports he is happy about his placement and waiting patiently to go there on Monday. Pt reports no further complains. Denies SI/HI/AVH.

## 2024-07-15 NOTE — ED Notes (Signed)
 Pt presents in dayroom.  Bright affect.  Positive and excited about rehab he has been accepted into/ Q 15 minute observations for safety continue

## 2024-07-15 NOTE — [facility] Group Notes (Signed)
 SPIRITUALITY GROUP NOTE   Spirituality group facilitated by Chaplain Hondo Nanda, MDiv, [facility].   Group Description:  Group focused on topic of hope.  Patients participated in facilitated discussion around topic, connecting with one another around experiences and definitions for hope.  Group members engaged with visual explorer photos, reflecting on what hope looks like for them today.  Group engaged in discussion around how their definitions of hope are present today in hospital.    Modalities: Psycho-social ed, Adlerian, Narrative, MI  Patient Progress:  Trevor [name] was present throughout group.  Actively participated in conversation.

## 2024-07-15 NOTE — Group Note (Signed)
 Group Topic: Relapse and Recovery  Group Date: 07/15/2024 Start Time: 2045 End Time: 2100 Facilitators: Tanda Ogren D, NT  Department: [facility]  Number of Participants: 6  Group Focus: coping skills Treatment Modality:  Psychoeducation Interventions utilized were support Purpose: express feelings  Name: Trevor [name] Date of Birth: [birth year]  MR: [id number]    Level of Participation: appropriate Quality of Participation: attentive Interactions with others: gave feedback Mood/Affect: appropriate Triggers (if applicable): n/a Cognition: concrete Progress: Significant Response: n/a Plan: follow-up needed  Patients Problems:  Patient Active Problem List   Diagnosis Date Noted   MDD (major depressive disorder) 07/10/2024   Moderate episode of recurrent major depressive disorder (HCC) 01/30/2024   GAD (generalized anxiety disorder) 01/30/2024   Transaminitis 06/18/2022   GERD (gastroesophageal reflux disease) 06/18/2022   Hypokalemia 06/18/2022   Chronic hepatitis C without hepatic coma (HCC) 06/28/2019   Psoriasis 06/07/2019   H/O withdrawal symptoms, alcohol, with delirium and seizures (HCC) 06/07/2019   Alcohol use disorder, severe, dependence (HCC) 06/03/2019   Obesity (BMI 30-39.9) 01/09/2016   HSV-2 (herpes simplex virus 2) infection

## 2024-07-15 NOTE — Group Note (Signed)
 Group Topic: Positive Affirmations  Group Date: 07/15/2024 Start Time: 1000 End Time: 1045 Facilitators: Lonzell Dwayne RAMAN, NT  Department: [facility]  Number of Participants: 3  Group Focus: affirmation Treatment Modality:  Psychoeducation Interventions utilized were problem solving Purpose: enhance coping skills  Name: Trevor [name] Date of Birth: 04-Oct-1981  MR: [id number]    Level of Participation: Patient did attend groupactive Quality of Participation: attentive Interactions with others: gave feedback Mood/Affect: positive Triggers (if applicable): N/A Cognition: coherent/clear Progress: Gaining insight Response: Appropriate  Plan: follow-up needed  Patients Problems:  Patient Active Problem List   Diagnosis Date Noted   MDD (major depressive disorder) 07/10/2024   Moderate episode of recurrent major depressive disorder (HCC) 01/30/2024   GAD (generalized anxiety disorder) 01/30/2024   Transaminitis 06/18/2022   GERD (gastroesophageal reflux disease) 06/18/2022   Hypokalemia 06/18/2022   Chronic hepatitis C without hepatic coma (HCC) 06/28/2019   Psoriasis 06/07/2019   H/O withdrawal symptoms, alcohol, with delirium and seizures (HCC) 06/07/2019   Alcohol use disorder, severe, dependence (HCC) 06/03/2019   Obesity (BMI 30-39.9) 01/09/2016   HSV-2 (herpes simplex virus 2) infection

## 2024-07-15 NOTE — ED Notes (Signed)
 Pt is sleeping at the moment. No acute distress noted. Q15 safety checks in place.

## 2024-07-15 NOTE — ED Notes (Signed)
 Pt has been calm and cooperative.   Attended group.  Behaviors appropriate NAD Q 15 minute observations for safety continue

## 2024-07-15 NOTE — ED Provider Notes (Signed)
 Behavioral Health Progress Note  Date and Time: 07/15/2024 12:25 PM Name: Trevor [name] MRN:  [id number]  Subjective:  Trevor [name] was seen in the common area on rounds today. He feels that he is doing pretty good and reports no current withdrawal symptoms. He is sleeping better and appetite is good. He is future oriented. We engage in some goal setting for both short and long term goals. He is able to discuss his family and friend supports and appears pleasantly surprised that he has so many.   Diagnosis:  Final diagnoses:  Alcohol use disorder  Severe episode of recurrent major depressive disorder, without psychotic features (HCC)    Total Time spent with patient: 15 minutes  Past Psychiatric History: Alcohol use disorder  Past Medical History: Hep C Family History: Grandparents had alcohol problems. Brother is recovered from alcohol Social History: Divorced. Lives with his mother. Has a 56 year-old daughter.    Sleep: Fair  Appetite:  Good  Current Medications:  Current Facility-Administered Medications  Medication Dose Route Frequency Provider Last Rate Last Admin   acetaminophen  (TYLENOL ) tablet 650 mg  650 mg Oral Q6H PRN Randall Starlyn HERO, NP   650 mg at 07/14/24 1534   alum & mag hydroxide-simeth (MAALOX/MYLANTA) 200-200-20 MG/5ML suspension 30 mL  30 mL Oral Q4H PRN Motley-Mangrum, Jadeka A, PMHNP       haloperidol  (HALDOL ) tablet 5 mg  5 mg Oral TID PRN Motley-Mangrum, Jadeka A, PMHNP       And   diphenhydrAMINE  (BENADRYL ) capsule 50 mg  50 mg Oral TID PRN Motley-Mangrum, Jadeka A, PMHNP   50 mg at 07/11/24 1110   haloperidol  lactate (HALDOL ) injection 5 mg  5 mg Intramuscular TID PRN Motley-Mangrum, Jadeka A, PMHNP       And   diphenhydrAMINE  (BENADRYL ) injection 50 mg  50 mg Intramuscular TID PRN Motley-Mangrum, Jadeka A, PMHNP       And   LORazepam  (ATIVAN ) injection 2 mg  2 mg Intramuscular TID PRN Motley-Mangrum, Jadeka A, PMHNP       haloperidol  lactate  (HALDOL ) injection 10 mg  10 mg Intramuscular TID PRN Motley-Mangrum, Jadeka A, PMHNP       And   diphenhydrAMINE  (BENADRYL ) injection 50 mg  50 mg Intramuscular TID PRN Motley-Mangrum, Jadeka A, PMHNP       And   LORazepam  (ATIVAN ) injection 2 mg  2 mg Intramuscular TID PRN Motley-Mangrum, Jadeka A, PMHNP       folic acid  (FOLVITE ) tablet 1 mg  1 mg Oral Daily Motley-Mangrum, Jadeka A, PMHNP   1 mg at 07/15/24 1041   hydrOXYzine  (ATARAX ) tablet 25 mg  25 mg Oral TID PRN Leigh Corean Massa, MD   25 mg at 07/15/24 1039   magnesium  hydroxide (MILK OF MAGNESIA) suspension 30 mL  30 mL Oral Daily PRN Motley-Mangrum, Jadeka A, PMHNP       mirtazapine  (REMERON ) tablet 15 mg  15 mg Oral QHS Tranice Laduke, Corean Massa, MD   15 mg at 07/14/24 2130   multivitamin with minerals tablet 1 tablet  1 tablet Oral Daily Motley-Mangrum, Jadeka A, PMHNP   1 tablet at 07/15/24 1040   thiamine  (VITAMIN B1) tablet 100 mg  100 mg Oral Daily Motley-Mangrum, Jadeka A, PMHNP   100 mg at 07/15/24 1041   Or   thiamine  (VITAMIN B1) injection 100 mg  100 mg Intravenous Daily Motley-Mangrum, Jadeka A, PMHNP       Current Outpatient Medications  Medication Sig Dispense Refill  hydrOXYzine  (ATARAX ) 25 MG tablet Take 1 tablet (25 mg total) by mouth every 6 (six) hours as needed for anxiety. 60 tablet 0   Multiple Vitamin (MULTIVITAMIN WITH MINERALS) TABS tablet Take 1 tablet by mouth daily.     naltrexone  380 MG SUSR IM injection Inject 380 mg into the muscle every 28 (twenty-eight) days.     sertraline  (ZOLOFT ) 50 MG tablet Take 50 mg by mouth daily.     valACYclovir  (VALTREX ) 500 MG tablet Take 500 mg by mouth daily as needed (For outbreaks).      Labs  Lab Results:  Admission on 07/10/2024, Discharged on 07/10/2024  Component Date Value Ref Range Status   Sodium 07/10/2024 139  135 - 145 mmol/L Final   Potassium 07/10/2024 3.4 (L)  3.5 - 5.1 mmol/L Final   Chloride 07/10/2024 102  98 - 111 mmol/L Final   CO2  07/10/2024 23  22 - 32 mmol/L Final   Glucose, Bld 07/10/2024 111 (H)  70 - 99 mg/dL Final   Glucose reference range applies only to samples taken after fasting for at least 8 hours.   BUN 07/10/2024 15  6 - 20 mg/dL Final   Creatinine, Ser 07/10/2024 0.80  0.61 - 1.24 mg/dL Final   Calcium  07/10/2024 8.2 (L)  8.9 - 10.3 mg/dL Final   Total Protein 90/85/7974 7.8  6.5 - 8.1 g/dL Final   Albumin 90/85/7974 4.4  3.5 - 5.0 g/dL Final   AST 90/85/7974 29  15 - 41 U/L Final   ALT 07/10/2024 33  0 - 44 U/L Final   Alkaline Phosphatase 07/10/2024 54  38 - 126 U/L Final   Total Bilirubin 07/10/2024 0.7  0.0 - 1.2 mg/dL Final   GFR, Estimated 07/10/2024 >60  >60 mL/min Final   Comment: (NOTE) Calculated using the CKD-EPI Creatinine Equation (2021)    Anion gap 07/10/2024 14  5 - 15 Final   Performed at [facility], [address], [place], [place] [address]   Alcohol, Ethyl (B) 07/10/2024 356 (HH)  <15 mg/dL Final   Comment: CRITICAL RESULT CALLED TO, READ BACK BY AND VERIFIED WITH PRUITT,G ON 07/10/24 AT 0352 BY PURDIE,J (NOTE) For medical purposes only. Performed at [facility], [address], [place], [place] [address]    WBC 07/10/2024 8.4  4.0 - 10.5 K/uL Final   RBC 07/10/2024 5.16  4.22 - 5.81 MIL/uL Final   Hemoglobin 07/10/2024 16.7  13.0 - 17.0 g/dL Final   HCT 90/85/7974 46.7  39.0 - 52.0 % Final   MCV 07/10/2024 90.5  80.0 - 100.0 fL Final   MCH 07/10/2024 32.4  26.0 - 34.0 pg Final   MCHC 07/10/2024 35.8  30.0 - 36.0 g/dL Final   RDW 90/85/7974 12.8  11.5 - 15.5 % Final   Platelets 07/10/2024 344  150 - 400 K/uL Final   nRBC 07/10/2024 0.0  0.0 - 0.2 % Final   Performed at [facility], [address] [place], [place] [address]   Opiates 07/10/2024 NONE DETECTED  NONE DETECTED Final   Cocaine 07/10/2024 NONE DETECTED  NONE DETECTED Final   Benzodiazepines 07/10/2024 NONE DETECTED  NONE DETECTED Final   Amphetamines 07/10/2024 NONE DETECTED  NONE DETECTED Final    Tetrahydrocannabinol 07/10/2024 NONE DETECTED  NONE DETECTED Final   Barbiturates 07/10/2024 NONE DETECTED  NONE DETECTED Final   Comment: (NOTE) DRUG SCREEN FOR MEDICAL PURPOSES ONLY.  IF CONFIRMATION IS NEEDED FOR ANY PURPOSE, NOTIFY LAB WITHIN 5 DAYS.  LOWEST  DETECTABLE LIMITS FOR URINE DRUG SCREEN Drug Class                     Cutoff (ng/mL) Amphetamine and metabolites    1000 Barbiturate and metabolites    200 Benzodiazepine                 200 Opiates and metabolites        300 Cocaine and metabolites        300 THC                            50 Performed at [facility], [address], [place], [place] [address]    Magnesium  07/10/2024 2.2  1.7 - 2.4 mg/dL Final   Performed at [facility], [address], [place], [place] [address]  Admission on 05/13/2024, Discharged on 05/20/2024  Component Date Value Ref Range Status   Hgb A1c MFr Bld 05/15/2024 4.8  4.8 - 5.6 % Final   Comment: (NOTE) Diagnosis of Diabetes The following HbA1c ranges recommended by the American Diabetes Association (ADA) may be used as an aid in the diagnosis of diabetes mellitus.  Hemoglobin             Suggested A1C NGSP%              Diagnosis  <5.7                   Non Diabetic  5.7-6.4                Pre-Diabetic  >6.4                   Diabetic  <7.0                   Glycemic control for                       adults with diabetes.     Mean Plasma Glucose 05/15/2024 91.06  mg/dL Final   Performed at [facility], [address] [place], [place] [address]   Cholesterol 05/15/2024 187  0 - 200 mg/dL Final   Triglycerides 92/79/7974 186 (H)  <150 mg/dL Final   HDL 92/79/7974 59  >40 mg/dL Final   Total CHOL/HDL Ratio 05/15/2024 3.2  RATIO Final   VLDL 05/15/2024 37  0 - 40 mg/dL Final   LDL Cholesterol 05/15/2024 91  0 - 99 mg/dL Final   Comment:        Total Cholesterol/HDL:CHD Risk Coronary Heart Disease Risk Table                     Men   Women  1/2 Average Risk   3.4    3.3  Average Risk       5.0   4.4  2 X Average Risk   9.6   7.1  3 X Average Risk  23.4   11.0        Use the calculated Patient Ratio above and the CHD Risk Table to determine the patient's CHD Risk.        ATP III CLASSIFICATION (LDL):  <100     mg/dL   Optimal  899-870  mg/dL   Near or Above                    Optimal  130-159  mg/dL   Borderline  839-810  mg/dL   High  >809     mg/dL   Very High Performed at [facility], [address] [place], [place] [address]    Vit D, 25-Hydroxy 05/15/2024 78.38  30 - 100 ng/mL Final   Comment: (NOTE) Vitamin D  deficiency has been defined by the Institute of Medicine  and an Endocrine Society practice guideline as a level of serum 25-OH  vitamin D  less than 20 ng/mL (1,2). The Endocrine Society went on to  further define vitamin D  insufficiency as a level between 21 and 29  ng/mL (2).  1. IOM (Institute of Medicine). 2010. Dietary reference intakes for  calcium  and D. [place]: [employer]. 2. Holick MF, Binkley , Bischoff-Ferrari HA, et al. Evaluation,  treatment, and prevention of vitamin D  deficiency: an Endocrine  Society clinical practice guideline, JCEM. 2011 Jul; 96(7): 1911-30.  Performed at [facility], [address] [place], [place] [address]    Vitamin B-12 05/15/2024 265  180 - 914 pg/mL Final   Comment: (NOTE) This assay is not validated for testing neonatal or myeloproliferative syndrome specimens for Vitamin B12 levels. Performed at [facility], [address] [place], [place] [address]    Magnesium  05/15/2024 1.9  1.7 - 2.4 mg/dL Final   Performed at [facility], [address] [place], [place] [address]   TSH 05/15/2024 1.255  0.350 - 4.500 uIU/mL Final   Comment: Performed by a 3rd Generation assay with a functional sensitivity of <=0.01 uIU/mL. Performed at [facility], [address], [place], [place] [address]   Admission on 05/13/2024,  Discharged on 05/13/2024  Component Date Value Ref Range Status   WBC 05/13/2024 8.3  4.0 - 10.5 K/uL Final   RBC 05/13/2024 5.19  4.22 - 5.81 MIL/uL Final   Hemoglobin 05/13/2024 16.7  13.0 - 17.0 g/dL Final   HCT 92/81/7974 48.0  39.0 - 52.0 % Final   MCV 05/13/2024 92.5  80.0 - 100.0 fL Final   MCH 05/13/2024 32.2  26.0 - 34.0 pg Final   MCHC 05/13/2024 34.8  30.0 - 36.0 g/dL Final   RDW 92/81/7974 13.5  11.5 - 15.5 % Final   Platelets 05/13/2024 329  150 - 400 K/uL Final   nRBC 05/13/2024 0.0  0.0 - 0.2 % Final   Neutrophils Relative % 05/13/2024 80  % Final   Neutro Abs 05/13/2024 6.6  1.7 - 7.7 K/uL Final   Lymphocytes Relative 05/13/2024 14  % Final   Lymphs Abs 05/13/2024 1.2  0.7 - 4.0 K/uL Final   Monocytes Relative 05/13/2024 5  % Final   Monocytes Absolute 05/13/2024 0.4  0.1 - 1.0 K/uL Final   Eosinophils Relative 05/13/2024 0  % Final   Eosinophils Absolute 05/13/2024 0.0  0.0 - 0.5 K/uL Final   Basophils Relative 05/13/2024 0  % Final   Basophils Absolute 05/13/2024 0.0  0.0 - 0.1 K/uL Final   Immature Granulocytes 05/13/2024 1  % Final   Abs Immature Granulocytes 05/13/2024 0.04  0.00 - 0.07 K/uL Final   Performed at [facility], [address], [place], [place] [address]   Sodium 05/13/2024 138  135 - 145 mmol/L Final   Potassium 05/13/2024 3.9  3.5 - 5.1 mmol/L Final   Chloride 05/13/2024 97 (L)  98 - 111 mmol/L Final   CO2 05/13/2024 30  22 - 32 mmol/L Final  Glucose, Bld 05/13/2024 103 (H)  70 - 99 mg/dL Final   Glucose reference range applies only to samples taken after fasting for at least 8 hours.   BUN 05/13/2024 8  6 - 20 mg/dL Final   Creatinine, Ser 05/13/2024 0.82  0.61 - 1.24 mg/dL Final   Calcium  05/13/2024 9.0  8.9 - 10.3 mg/dL Final   Total Protein 92/81/7974 7.1  6.5 - 8.1 g/dL Final   Albumin 92/81/7974 4.1  3.5 - 5.0 g/dL Final   AST 92/81/7974 23  15 - 41 U/L Final   ALT 05/13/2024 21  0 - 44 U/L Final   Alkaline Phosphatase 05/13/2024  80  38 - 126 U/L Final   Total Bilirubin 05/13/2024 0.8  0.0 - 1.2 mg/dL Final   GFR, Estimated 05/13/2024 >60  >60 mL/min Final   Comment: (NOTE) Calculated using the CKD-EPI Creatinine Equation (2021)    Anion gap 05/13/2024 11  5 - 15 Final   Performed at [facility], [address] [place], [place] [address]   Magnesium  05/13/2024 1.7  1.7 - 2.4 mg/dL Final   Performed at [facility], [address] [place], [place] [address]   Alcohol, Ethyl (B) 05/13/2024 52 (H)  <15 mg/dL Final   Comment: (NOTE) For medical purposes only. Performed at [facility], [address], [place], [place] [address]    POC Amphetamine UR 05/13/2024 None Detected  NONE DETECTED (Cut Off Level 1000 ng/mL) Final   POC Secobarbital (BAR) 05/13/2024 None Detected  NONE DETECTED (Cut Off Level 300 ng/mL) Final   POC Buprenorphine (BUP) 05/13/2024 None Detected  NONE DETECTED (Cut Off Level 10 ng/mL) Final   POC Oxazepam (BZO) 05/13/2024 None Detected  NONE DETECTED (Cut Off Level 300 ng/mL) Final   POC Cocaine UR 05/13/2024 None Detected  NONE DETECTED (Cut Off Level 300 ng/mL) Final   POC Methamphetamine UR 05/13/2024 None Detected  NONE DETECTED (Cut Off Level 1000 ng/mL) Final   POC Morphine  05/13/2024 None Detected  NONE DETECTED (Cut Off Level 300 ng/mL) Final   POC Methadone UR 05/13/2024 None Detected  NONE DETECTED (Cut Off Level 300 ng/mL) Final   POC Oxycodone  UR 05/13/2024 None Detected  NONE DETECTED (Cut Off Level 100 ng/mL) Final   POC Marijuana UR 05/13/2024 None Detected  NONE DETECTED (Cut Off Level 50 ng/mL) Final  Admission on 01/23/2024, Discharged on 02/02/2024  Component Date Value Ref Range Status   Sodium 01/30/2024 137  135 - 145 mmol/L Final   Potassium 01/30/2024 4.1  3.5 - 5.1 mmol/L Final   Chloride 01/30/2024 101  98 - 111 mmol/L Final   CO2 01/30/2024 30  22 - 32 mmol/L Final   Glucose, Bld 01/30/2024 92  70 - 99 mg/dL Final   Glucose reference range  applies only to samples taken after fasting for at least 8 hours.   BUN 01/30/2024 17  6 - 20 mg/dL Final   Creatinine, Ser 01/30/2024 1.03  0.61 - 1.24 mg/dL Final   Calcium  01/30/2024 8.9  8.9 - 10.3 mg/dL Final   Total Protein 95/94/7974 7.2  6.5 - 8.1 g/dL Final   Albumin 95/94/7974 4.1  3.5 - 5.0 g/dL Final   AST 95/94/7974 18  15 - 41 U/L Final   ALT 01/30/2024 19  0 - 44 U/L Final   Alkaline Phosphatase 01/30/2024 43  38 - 126 U/L Final   Total Bilirubin 01/30/2024 0.6  0.0 - 1.2 mg/dL Final  GFR, Estimated 01/30/2024 >60  >60 mL/min Final   Comment: (NOTE) Calculated using the CKD-EPI Creatinine Equation (2021)    Anion gap 01/30/2024 6  5 - 15 Final   Performed at [facility], [address], [place], [place] [address]  Admission on 01/22/2024, Discharged on 01/23/2024  Component Date Value Ref Range Status   WBC 01/22/2024 6.8  4.0 - 10.5 K/uL Final   RBC 01/22/2024 5.22  4.22 - 5.81 MIL/uL Final   Hemoglobin 01/22/2024 17.2 (H)  13.0 - 17.0 g/dL Final   HCT 96/71/7974 47.8  39.0 - 52.0 % Final   MCV 01/22/2024 91.6  80.0 - 100.0 fL Final   MCH 01/22/2024 33.0  26.0 - 34.0 pg Final   MCHC 01/22/2024 36.0  30.0 - 36.0 g/dL Final   RDW 96/71/7974 12.1  11.5 - 15.5 % Final   Platelets 01/22/2024 319  150 - 400 K/uL Final   nRBC 01/22/2024 0.0  0.0 - 0.2 % Final   Neutrophils Relative % 01/22/2024 70  % Final   Neutro Abs 01/22/2024 4.8  1.7 - 7.7 K/uL Final   Lymphocytes Relative 01/22/2024 25  % Final   Lymphs Abs 01/22/2024 1.7  0.7 - 4.0 K/uL Final   Monocytes Relative 01/22/2024 4  % Final   Monocytes Absolute 01/22/2024 0.3  0.1 - 1.0 K/uL Final   Eosinophils Relative 01/22/2024 0  % Final   Eosinophils Absolute 01/22/2024 0.0  0.0 - 0.5 K/uL Final   Basophils Relative 01/22/2024 1  % Final   Basophils Absolute 01/22/2024 0.1  0.0 - 0.1 K/uL Final   Immature Granulocytes 01/22/2024 0  % Final   Abs Immature Granulocytes 01/22/2024 0.02  0.00 - 0.07 K/uL  Final   Performed at [facility], [address], [place], [place] [address]   Sodium 01/22/2024 138  135 - 145 mmol/L Final   Potassium 01/22/2024 3.9  3.5 - 5.1 mmol/L Final   Chloride 01/22/2024 99  98 - 111 mmol/L Final   CO2 01/22/2024 25  22 - 32 mmol/L Final   Glucose, Bld 01/22/2024 99  70 - 99 mg/dL Final   Glucose reference range applies only to samples taken after fasting for at least 8 hours.   BUN 01/22/2024 13  6 - 20 mg/dL Final   Creatinine, Ser 01/22/2024 0.83  0.61 - 1.24 mg/dL Final   Calcium  01/22/2024 8.9  8.9 - 10.3 mg/dL Final   Total Protein 96/71/7974 7.5  6.5 - 8.1 g/dL Final   Albumin 96/71/7974 4.6  3.5 - 5.0 g/dL Final   AST 96/71/7974 44 (H)  15 - 41 U/L Final   ALT 01/22/2024 46 (H)  0 - 44 U/L Final   Alkaline Phosphatase 01/22/2024 60  38 - 126 U/L Final   Total Bilirubin 01/22/2024 0.8  0.0 - 1.2 mg/dL Final   GFR, Estimated 01/22/2024 >60  >60 mL/min Final   Comment: (NOTE) Calculated using the CKD-EPI Creatinine Equation (2021)    Anion gap 01/22/2024 14  5 - 15 Final   Performed at [facility], [address], [place], [place] [address]   Hgb A1c MFr Bld 01/22/2024 5.0  4.8 - 5.6 % Final   Comment: (NOTE) Pre diabetes:          5.7%-6.4%  Diabetes:              >6.4%  Glycemic control for   <7.0% adults with diabetes  Mean Plasma Glucose 01/22/2024 96.8  mg/dL Final   Performed at [facility], [address] [place], [place] [address]   Magnesium  01/22/2024 1.9  1.7 - 2.4 mg/dL Final   Performed at [facility], [address], [place], [place] [address]   Alcohol, Ethyl (B) 01/22/2024 109 (H)  <10 mg/dL Final   Comment: (NOTE) Lowest detectable limit for serum alcohol is 10 mg/dL.  For medical purposes only. Performed at [facility], [address], [place], [place] [address]    Cholesterol 01/22/2024 202 (H)  0 - 200 mg/dL Final   Triglycerides 96/71/7974 65  <150 mg/dL Final   HDL 96/71/7974 84   >40 mg/dL Final   Total CHOL/HDL Ratio 01/22/2024 2.4  RATIO Final   VLDL 01/22/2024 13  0 - 40 mg/dL Final   LDL Cholesterol 01/22/2024 105 (H)  0 - 99 mg/dL Final   Comment:        Total Cholesterol/HDL:CHD Risk Coronary Heart Disease Risk Table                     Men   Women  1/2 Average Risk   3.4   3.3  Average Risk       5.0   4.4  2 X Average Risk   9.6   7.1  3 X Average Risk  23.4   11.0        Use the calculated Patient Ratio above and the CHD Risk Table to determine the patient's CHD Risk.        ATP III CLASSIFICATION (LDL):  <100     mg/dL   Optimal  899-870  mg/dL   Near or Above                    Optimal  130-159  mg/dL   Borderline  839-810  mg/dL   High  >809     mg/dL   Very High Performed at [facility], [address], [place], [place] [address]    TSH 01/22/2024 0.458  0.350 - 4.500 uIU/mL Final   Comment: Performed by a 3rd Generation assay with a functional sensitivity of <=0.01 uIU/mL. Performed at [facility], [address] [place], [place] [address]    RPR Ser Ql 01/22/2024 NON REACTIVE  NON REACTIVE Final   Performed at [facility], [address], [place], [place] [address]   Color, Urine 01/22/2024 YELLOW  YELLOW Final   APPearance 01/22/2024 CLEAR  CLEAR Final   Specific Gravity, Urine 01/22/2024 1.020  1.005 - 1.030 Final   pH 01/22/2024 5.0  5.0 - 8.0 Final   Glucose, UA 01/22/2024 NEGATIVE  NEGATIVE mg/dL Final   Hgb urine dipstick 01/22/2024 NEGATIVE  NEGATIVE Final   Bilirubin Urine 01/22/2024 NEGATIVE  NEGATIVE Final   Ketones, ur 01/22/2024 5 (A)  NEGATIVE mg/dL Final   Protein, ur 96/71/7974 NEGATIVE  NEGATIVE mg/dL Final   Nitrite 96/71/7974 NEGATIVE  NEGATIVE Final   Leukocytes,Ua 01/22/2024 NEGATIVE  NEGATIVE Final   Performed at [facility], [address] [place], [place] [address]   POC Amphetamine UR 01/22/2024 None Detected   Final   POC Secobarbital (BAR) 01/22/2024 None Detected   Final    POC Buprenorphine (BUP) 01/22/2024 None Detected   Final   POC Oxazepam (BZO) 01/22/2024 None Detected   Final   POC Cocaine UR 01/22/2024 None Detected  Final   POC Methamphetamine UR 01/22/2024 None Detected   Final   POC Morphine  01/22/2024 None Detected   Final   POC Methadone UR 01/22/2024 None Detected   Final   POC Oxycodone  UR 01/22/2024 None Detected   Final   POC Marijuana UR 01/22/2024 None Detected   Final    Blood Alcohol level:  Lab Results  Component Value Date   ETH 356 (HH) 07/10/2024   ETH 52 (H) 05/13/2024    Metabolic Disorder Labs: Lab Results  Component Value Date   HGBA1C 4.8 05/15/2024   MPG 91.06 05/15/2024   MPG 96.8 01/22/2024   No results found for: PROLACTIN Lab Results  Component Value Date   CHOL 187 05/15/2024   TRIG 186 (H) 05/15/2024   HDL 59 05/15/2024   CHOLHDL 3.2 05/15/2024   VLDL 37 05/15/2024   LDLCALC 91 05/15/2024   LDLCALC 105 (H) 01/22/2024    Therapeutic Lab Levels: No results found for: LITHIUM No results found for: VALPROATE No results found for: CBMZ  Physical Findings   AUDIT    Flowsheet Row ED from 05/13/2024 in [facility] ED from 01/23/2024 in [facility]  Alcohol Use Disorder Identification Test Final Score (AUDIT) 26 36   GAD-7    Flowsheet Row Counselor from 10/22/2023 in [facility]  Total GAD-7 Score 8   PHQ2-9    Flowsheet Row ED from 07/10/2024 in [facility] ED from 05/13/2024 in [facility] ED from 01/23/2024 in [facility] ED from 01/22/2024 in [facility] Counselor from 10/22/2023 in [facility]  PHQ-2 Total Score 2 0 3 4 0  PHQ-9 Total Score 7 9 14 12  --   Flowsheet Row ED from 07/10/2024 in [facility] Most recent reading at 07/10/2024   4:50 PM ED from 07/10/2024 in [facility] Emergency Department at [facility] Most recent reading at 07/10/2024  3:07 AM ED from 05/13/2024 in [facility] Most recent reading at 05/13/2024  4:39 PM  C-SSRS RISK CATEGORY No Risk Low Risk No Risk     Musculoskeletal  Strength & Muscle Tone: within normal limits Gait & Station: normal Patient leans: N/A  Psychiatric Specialty Exam  Presentation  General Appearance:  Appropriate for Environment; Casual  Eye Contact: Good  Speech: Normal Rate  Speech Volume: Normal  Handedness: Right   Mood and Affect  Mood: Euthymic  Affect: Appropriate   Thought Process  Thought Processes: Linear  Descriptions of Associations:Intact  Orientation:Full (Time, Place and Person)  Thought Content:Logical  Diagnosis of Schizophrenia or Schizoaffective disorder in past: No    Hallucinations:Hallucinations: None  Ideas of Reference:None  Suicidal Thoughts:Suicidal Thoughts: No  Homicidal Thoughts:Homicidal Thoughts: No   Sensorium  Memory: Immediate Good; Remote Good  Judgment: Good  Insight: Good   Executive Functions  Concentration: Good  Attention Span: Good  Recall: Good  Fund of Knowledge: Good  Language: Good   Psychomotor Activity  Psychomotor Activity:Psychomotor Activity: Normal   Assets  Assets: Communication Skills; Desire for Improvement; Social Support; Housing   Sleep  Sleep:Sleep: Good  Estimated Sleeping Duration (Last 24 Hours): 5.50-6.75 hours  No data recorded  Physical Exam  Physical Exam Vitals and nursing note reviewed.  Constitutional:      Appearance: Normal appearance.  HENT:     Head: Normocephalic.  Eyes:  Extraocular Movements: Extraocular movements intact.  Pulmonary:     Effort: Pulmonary effort is normal.  Musculoskeletal:        General: Normal range of motion.     Cervical back: Normal range of motion.   Neurological:     General: No focal deficit present.     Mental Status: He is alert and oriented to person, place, and time.  Psychiatric:        Behavior: Behavior normal.    Review of Systems  Constitutional:  Negative for chills, diaphoresis and fever.  Gastrointestinal:  Negative for nausea and vomiting.  Musculoskeletal:  Negative for joint pain and myalgias.  Neurological:  Negative for tremors.  Psychiatric/Behavioral:  Negative for hallucinations and suicidal ideas.    Blood pressure 115/67, pulse 74, temperature 98.4 F (36.9 C), temperature source Oral, resp. rate 18, SpO2 98%. There is no height or weight on file to calculate BMI.  Treatment Plan Summary: Long Term Goals: Improvement in symptoms so as ready for discharge   Short Term Goals: Patient will verbalize feelings in meetings with treatment team members., Patient will attend at least of 50% of the groups daily., Pt will complete the PHQ9 on admission, day 3 and discharge., and Patient will take medications as prescribed daily.   Medications: Mood/anxiety: continue group therapy, milieu therapy, 1:1 evaluation with provider.  Medication management:  Remeron  15mg  PO at bedtime for mood, anxiety, sleep and appetite  Substance Abuse:  Severe alcohol use disorder: replacing thiamine . Encourage adequate PO intake. May require lab monitoring of electrolytes. Benzodiazepine taper as tolerated. Seizure precautions. Monitor for delirium tremens.  Nicotine and tobacco use: N/A Medical: PRNs for pain, constipation, indigestion available.  Labs/studies: no new labs Safety and Monitoring: voluntarily admission to [facility]/Facility based care unit [facility]) unit for safety, stabilization and treatment Daily contact with patient to assess and evaluate symptoms and progress in treatment Patient's case to be discussed in multi-disciplinary team meeting Observation Level : q15 minute checks Vital signs: q12 hours Precautions:  withdrawal   Based on my evaluation the patient does not appear to have an emergency medical condition.  Corean Anette Potters, MD 07/15/2024 12:25 PM

## 2024-07-16 DIAGNOSIS — F419 Anxiety disorder, unspecified: Secondary | ICD-10-CM | POA: Diagnosis not present

## 2024-07-16 DIAGNOSIS — F332 Major depressive disorder, recurrent severe without psychotic features: Secondary | ICD-10-CM | POA: Diagnosis not present

## 2024-07-16 DIAGNOSIS — F1029 Alcohol dependence with unspecified alcohol-induced disorder: Secondary | ICD-10-CM | POA: Diagnosis not present

## 2024-07-16 DIAGNOSIS — R45851 Suicidal ideations: Secondary | ICD-10-CM | POA: Diagnosis not present

## 2024-07-16 NOTE — ED Notes (Signed)
 Pt is sleeping. No acute distress noted. Q15 safety checks in place.

## 2024-07-16 NOTE — ED Notes (Signed)
 Patient is in the bedroom composed and sleeping.NAD.  Respirations are even and unlabored. Will continue to monitor for safety.

## 2024-07-16 NOTE — Group Note (Signed)
 Group Topic: Balance in Life  Group Date: 07/16/2024 Start Time: 1000 End Time: 1100 Facilitators: Alyse Leilani LABOR, NT  Department: [facility]  Number of Participants: 9  Group Focus: abuse issues, acceptance, activities of daily living skills, and coping skills Treatment Modality:  Skills Training Interventions utilized were group exercise, patient education, and problem solving Purpose: enhance coping skills, express feelings, and improve communication skills   Name: GAUTHAM HEWINS Date of Birth: 03/14/1981  MR: [id number]    Level of Participation: active Quality of Participation: motivated and supportive Interactions with others: gave feedback Mood/Affect: positive Triggers (if applicable): none Cognition: insightful Progress: Gaining insight Response: none Plan: patient will be encouraged to keep attending groups.  Patients Problems:  Patient Active Problem List   Diagnosis Date Noted   MDD (major depressive disorder) 07/10/2024   Moderate episode of recurrent major depressive disorder (HCC) 01/30/2024   GAD (generalized anxiety disorder) 01/30/2024   Transaminitis 06/18/2022   GERD (gastroesophageal reflux disease) 06/18/2022   Hypokalemia 06/18/2022   Chronic hepatitis C without hepatic coma (HCC) 06/28/2019   Psoriasis 06/07/2019   H/O withdrawal symptoms, alcohol, with delirium and seizures (HCC) 06/07/2019   Alcohol use disorder, severe, dependence (HCC) 06/03/2019   Obesity (BMI 30-39.9) 01/09/2016   HSV-2 (herpes simplex virus 2) infection

## 2024-07-16 NOTE — ED Notes (Signed)
 Patient is eating lunch in the dinning area. Pt is cooperative although does report feeling anxious and off. Ciwa is 4 att and VSS. Denies AVH. Safety measures cont. Gave anxiety medication during morning meds.

## 2024-07-16 NOTE — ED Notes (Signed)
 Patient is in dinning area watching a movie. Patient is currently calm and cooperative. Safety measures cont.

## 2024-07-16 NOTE — ED Notes (Signed)
 Patient observed in dinning area. Pt is aox4, denies pain or discomfort att. Denies SI/HI and AVH. Does report feeling constipated. Safety measures and POC cont.

## 2024-07-16 NOTE — ED Provider Notes (Signed)
 Behavioral Health Progress Note  Date and Time: 07/16/2024 2:18 PM Name: Trevor [name] MRN:  [id number]  Subjective:  [place] was seen today in the common area on rounds. He feels that he is doing better now. Earlier he had an episode of tremors and confusion. He is a bit clearer with hydroxyzine , but still tremulous. He is not having GI or other symptoms. He is sleeping and eating okay. Still having some anxiety.   Diagnosis:  Final diagnoses:  Alcohol use disorder  Severe episode of recurrent major depressive disorder, without psychotic features (HCC)    Total Time spent with patient: 20 minutes  Past Psychiatric History: Alcohol use disorder  Past Medical History: Hep C Family History: Grandparents had alcohol problems. Brother is recovered from alcohol Social History: Divorced. Lives with his mother. Has a 26 year-old daughter.   Sleep: Good  Appetite:  Good  Current Medications:  Current Facility-Administered Medications  Medication Dose Route Frequency Provider Last Rate Last Admin   acetaminophen  (TYLENOL ) tablet 650 mg  650 mg Oral Q6H PRN Randall Starlyn HERO, NP   650 mg at 07/16/24 0914   alum & mag hydroxide-simeth (MAALOX/MYLANTA) 200-200-20 MG/5ML suspension 30 mL  30 mL Oral Q4H PRN Motley-Mangrum, Jadeka A, PMHNP       haloperidol  (HALDOL ) tablet 5 mg  5 mg Oral TID PRN Motley-Mangrum, Jadeka A, PMHNP       And   diphenhydrAMINE  (BENADRYL ) capsule 50 mg  50 mg Oral TID PRN Motley-Mangrum, Jadeka A, PMHNP   50 mg at 07/11/24 1110   haloperidol  lactate (HALDOL ) injection 5 mg  5 mg Intramuscular TID PRN Motley-Mangrum, Jadeka A, PMHNP       And   diphenhydrAMINE  (BENADRYL ) injection 50 mg  50 mg Intramuscular TID PRN Motley-Mangrum, Jadeka A, PMHNP       And   LORazepam  (ATIVAN ) injection 2 mg  2 mg Intramuscular TID PRN Motley-Mangrum, Jadeka A, PMHNP       haloperidol  lactate (HALDOL ) injection 10 mg  10 mg Intramuscular TID PRN Motley-Mangrum, Jadeka A, PMHNP        And   diphenhydrAMINE  (BENADRYL ) injection 50 mg  50 mg Intramuscular TID PRN Motley-Mangrum, Jadeka A, PMHNP       And   LORazepam  (ATIVAN ) injection 2 mg  2 mg Intramuscular TID PRN Motley-Mangrum, Jadeka A, PMHNP       folic acid  (FOLVITE ) tablet 1 mg  1 mg Oral Daily Motley-Mangrum, Jadeka A, PMHNP   1 mg at 07/16/24 0915   hydrOXYzine  (ATARAX ) tablet 25 mg  25 mg Oral TID PRN Leigh Corean Massa, MD   25 mg at 07/16/24 1309   magnesium  hydroxide (MILK OF MAGNESIA) suspension 30 mL  30 mL Oral Daily PRN Motley-Mangrum, Jadeka A, PMHNP   30 mL at 07/16/24 0913   mirtazapine  (REMERON ) tablet 15 mg  15 mg Oral QHS Kejon Feild, Corean Massa, MD   15 mg at 07/15/24 2104   multivitamin with minerals tablet 1 tablet  1 tablet Oral Daily Motley-Mangrum, Jadeka A, PMHNP   1 tablet at 07/16/24 9085   thiamine  (VITAMIN B1) tablet 100 mg  100 mg Oral Daily Motley-Mangrum, Jadeka A, PMHNP   100 mg at 07/16/24 9085   Or   thiamine  (VITAMIN B1) injection 100 mg  100 mg Intravenous Daily Motley-Mangrum, Jadeka A, PMHNP       Current Outpatient Medications  Medication Sig Dispense Refill   hydrOXYzine  (ATARAX ) 25 MG tablet Take 1 tablet (25 mg  total) by mouth every 6 (six) hours as needed for anxiety. 60 tablet 0   Multiple Vitamin (MULTIVITAMIN WITH MINERALS) TABS tablet Take 1 tablet by mouth daily.     naltrexone  380 MG SUSR IM injection Inject 380 mg into the muscle every 28 (twenty-eight) days.     sertraline  (ZOLOFT ) 50 MG tablet Take 50 mg by mouth daily.     valACYclovir  (VALTREX ) 500 MG tablet Take 500 mg by mouth daily as needed (For outbreaks).      Labs  Lab Results:  Admission on 07/10/2024, Discharged on 07/10/2024  Component Date Value Ref Range Status   Sodium 07/10/2024 139  135 - 145 mmol/L Final   Potassium 07/10/2024 3.4 (L)  3.5 - 5.1 mmol/L Final   Chloride 07/10/2024 102  98 - 111 mmol/L Final   CO2 07/10/2024 23  22 - 32 mmol/L Final   Glucose, Bld 07/10/2024 111 (H)  70  - 99 mg/dL Final   Glucose reference range applies only to samples taken after fasting for at least 8 hours.   BUN 07/10/2024 15  6 - 20 mg/dL Final   Creatinine, Ser 07/10/2024 0.80  0.61 - 1.24 mg/dL Final   Calcium  07/10/2024 8.2 (L)  8.9 - 10.3 mg/dL Final   Total Protein 90/85/7974 7.8  6.5 - 8.1 g/dL Final   Albumin 90/85/7974 4.4  3.5 - 5.0 g/dL Final   AST 90/85/7974 29  15 - 41 U/L Final   ALT 07/10/2024 33  0 - 44 U/L Final   Alkaline Phosphatase 07/10/2024 54  38 - 126 U/L Final   Total Bilirubin 07/10/2024 0.7  0.0 - 1.2 mg/dL Final   GFR, Estimated 07/10/2024 >60  >60 mL/min Final   Comment: (NOTE) Calculated using the CKD-EPI Creatinine Equation (2021)    Anion gap 07/10/2024 14  5 - 15 Final   Performed at [facility], [address], [place], [place] [address]   Alcohol, Ethyl (B) 07/10/2024 356 (HH)  <15 mg/dL Final   Comment: CRITICAL RESULT CALLED TO, READ BACK BY AND VERIFIED WITH PRUITT,G ON 07/10/24 AT 0352 BY PURDIE,J (NOTE) For medical purposes only. Performed at [facility], [address] [place], [place] [address]    WBC 07/10/2024 8.4  4.0 - 10.5 K/uL Final   RBC 07/10/2024 5.16  4.22 - 5.81 MIL/uL Final   Hemoglobin 07/10/2024 16.7  13.0 - 17.0 g/dL Final   HCT 90/85/7974 46.7  39.0 - 52.0 % Final   MCV 07/10/2024 90.5  80.0 - 100.0 fL Final   MCH 07/10/2024 32.4  26.0 - 34.0 pg Final   MCHC 07/10/2024 35.8  30.0 - 36.0 g/dL Final   RDW 90/85/7974 12.8  11.5 - 15.5 % Final   Platelets 07/10/2024 344  150 - 400 K/uL Final   nRBC 07/10/2024 0.0  0.0 - 0.2 % Final   Performed at [facility], [address], [place], [place] [address]   Opiates 07/10/2024 NONE DETECTED  NONE DETECTED Final   Cocaine 07/10/2024 NONE DETECTED  NONE DETECTED Final   Benzodiazepines 07/10/2024 NONE DETECTED  NONE DETECTED Final   Amphetamines 07/10/2024 NONE DETECTED  NONE DETECTED Final   Tetrahydrocannabinol 07/10/2024 NONE DETECTED  NONE DETECTED Final   Barbiturates  07/10/2024 NONE DETECTED  NONE DETECTED Final   Comment: (NOTE) DRUG SCREEN FOR MEDICAL PURPOSES ONLY.  IF CONFIRMATION IS NEEDED FOR ANY PURPOSE, NOTIFY LAB WITHIN 5 DAYS.  LOWEST DETECTABLE LIMITS FOR URINE DRUG SCREEN Drug Class  Cutoff (ng/mL) Amphetamine and metabolites    1000 Barbiturate and metabolites    200 Benzodiazepine                 200 Opiates and metabolites        300 Cocaine and metabolites        300 THC                            50 Performed at [facility], [address], [place], [place] [address]    Magnesium  07/10/2024 2.2  1.7 - 2.4 mg/dL Final   Performed at [facility], [address], [place], [place] [address]  Admission on 05/13/2024, Discharged on 05/20/2024  Component Date Value Ref Range Status   Hgb A1c MFr Bld 05/15/2024 4.8  4.8 - 5.6 % Final   Comment: (NOTE) Diagnosis of Diabetes The following HbA1c ranges recommended by the American Diabetes Association (ADA) may be used as an aid in the diagnosis of diabetes mellitus.  Hemoglobin             Suggested A1C NGSP%              Diagnosis  <5.7                   Non Diabetic  5.7-6.4                Pre-Diabetic  >6.4                   Diabetic  <7.0                   Glycemic control for                       adults with diabetes.     Mean Plasma Glucose 05/15/2024 91.06  mg/dL Final   Performed at [facility], [address], [place], [place] [address]   Cholesterol 05/15/2024 187  0 - 200 mg/dL Final   Triglycerides 92/79/7974 186 (H)  <150 mg/dL Final   HDL 92/79/7974 59  >40 mg/dL Final   Total CHOL/HDL Ratio 05/15/2024 3.2  RATIO Final   VLDL 05/15/2024 37  0 - 40 mg/dL Final   LDL Cholesterol 05/15/2024 91  0 - 99 mg/dL Final   Comment:        Total Cholesterol/HDL:CHD Risk Coronary Heart Disease Risk Table                     Men   Women  1/2 Average Risk   3.4   3.3  Average Risk       5.0   4.4  2 X Average Risk   9.6   7.1  3 X Average  Risk  23.4   11.0        Use the calculated Patient Ratio above and the CHD Risk Table to determine the patient's CHD Risk.        ATP III CLASSIFICATION (LDL):  <100     mg/dL   Optimal  899-870  mg/dL   Near or Above                    Optimal  130-159  mg/dL   Borderline  839-810  mg/dL   High  >809     mg/dL   Very High Performed at [facility], [address] [place], [place] [address]    Vit D, 25-Hydroxy 05/15/2024 78.38  30 - 100 ng/mL Final   Comment: (NOTE) Vitamin D  deficiency has been defined by the Institute of Medicine  and an Endocrine Society practice guideline as a level of serum 25-OH  vitamin D  less than 20 ng/mL (1,2). The Endocrine Society went on to  further define vitamin D  insufficiency as a level between 21 and 29  ng/mL (2).  1. IOM (Institute of Medicine). 2010. Dietary reference intakes for  calcium  and D. [place]: [employer]. 2. Holick MF, Binkley Vian, Bischoff-Ferrari HA, et al. Evaluation,  treatment, and prevention of vitamin D  deficiency: an Endocrine  Society clinical practice guideline, JCEM. 2011 Jul; 96(7): 1911-30.  Performed at [facility], [address], [place], [place] [address]    Vitamin B-12 05/15/2024 265  180 - 914 pg/mL Final   Comment: (NOTE) This assay is not validated for testing neonatal or myeloproliferative syndrome specimens for Vitamin B12 levels. Performed at [facility], [address], [place], [place] [address]    Magnesium  05/15/2024 1.9  1.7 - 2.4 mg/dL Final   Performed at [facility], [address] [place], [place] [address]   TSH 05/15/2024 1.255  0.350 - 4.500 uIU/mL Final   Comment: Performed by a 3rd Generation assay with a functional sensitivity of <=0.01 uIU/mL. Performed at [facility], [address], [place], [place] [address]   Admission on 05/13/2024, Discharged on 05/13/2024  Component Date Value Ref Range Status   WBC 05/13/2024 8.3   4.0 - 10.5 K/uL Final   RBC 05/13/2024 5.19  4.22 - 5.81 MIL/uL Final   Hemoglobin 05/13/2024 16.7  13.0 - 17.0 g/dL Final   HCT 92/81/7974 48.0  39.0 - 52.0 % Final   MCV 05/13/2024 92.5  80.0 - 100.0 fL Final   MCH 05/13/2024 32.2  26.0 - 34.0 pg Final   MCHC 05/13/2024 34.8  30.0 - 36.0 g/dL Final   RDW 92/81/7974 13.5  11.5 - 15.5 % Final   Platelets 05/13/2024 329  150 - 400 K/uL Final   nRBC 05/13/2024 0.0  0.0 - 0.2 % Final   Neutrophils Relative % 05/13/2024 80  % Final   Neutro Abs 05/13/2024 6.6  1.7 - 7.7 K/uL Final   Lymphocytes Relative 05/13/2024 14  % Final   Lymphs Abs 05/13/2024 1.2  0.7 - 4.0 K/uL Final   Monocytes Relative 05/13/2024 5  % Final   Monocytes Absolute 05/13/2024 0.4  0.1 - 1.0 K/uL Final   Eosinophils Relative 05/13/2024 0  % Final   Eosinophils Absolute 05/13/2024 0.0  0.0 - 0.5 K/uL Final   Basophils Relative 05/13/2024 0  % Final   Basophils Absolute 05/13/2024 0.0  0.0 - 0.1 K/uL Final   Immature Granulocytes 05/13/2024 1  % Final   Abs Immature Granulocytes 05/13/2024 0.04  0.00 - 0.07 K/uL Final   Performed at [facility], [address], [place], [place] [address]   Sodium 05/13/2024 138  135 - 145 mmol/L Final   Potassium 05/13/2024 3.9  3.5 - 5.1 mmol/L Final   Chloride 05/13/2024 97 (L)  98 - 111 mmol/L Final   CO2 05/13/2024 30  22 - 32 mmol/L Final   Glucose, Bld 05/13/2024 103 (H)  70 - 99 mg/dL Final   Glucose reference range applies only to samples taken after fasting for at least  8 hours.   BUN 05/13/2024 8  6 - 20 mg/dL Final   Creatinine, Ser 05/13/2024 0.82  0.61 - 1.24 mg/dL Final   Calcium  05/13/2024 9.0  8.9 - 10.3 mg/dL Final   Total Protein 92/81/7974 7.1  6.5 - 8.1 g/dL Final   Albumin 92/81/7974 4.1  3.5 - 5.0 g/dL Final   AST 92/81/7974 23  15 - 41 U/L Final   ALT 05/13/2024 21  0 - 44 U/L Final   Alkaline Phosphatase 05/13/2024 80  38 - 126 U/L Final   Total Bilirubin 05/13/2024 0.8  0.0 - 1.2 mg/dL Final   GFR,  Estimated 05/13/2024 >60  >60 mL/min Final   Comment: (NOTE) Calculated using the CKD-EPI Creatinine Equation (2021)    Anion gap 05/13/2024 11  5 - 15 Final   Performed at [facility], [address] [place], [place] [address]   Magnesium  05/13/2024 1.7  1.7 - 2.4 mg/dL Final   Performed at [facility], [address], [place], [place] [address]   Alcohol, Ethyl (B) 05/13/2024 52 (H)  <15 mg/dL Final   Comment: (NOTE) For medical purposes only. Performed at [facility], [address] [place], [place] [address]    POC Amphetamine UR 05/13/2024 None Detected  NONE DETECTED (Cut Off Level 1000 ng/mL) Final   POC Secobarbital (BAR) 05/13/2024 None Detected  NONE DETECTED (Cut Off Level 300 ng/mL) Final   POC Buprenorphine (BUP) 05/13/2024 None Detected  NONE DETECTED (Cut Off Level 10 ng/mL) Final   POC Oxazepam (BZO) 05/13/2024 None Detected  NONE DETECTED (Cut Off Level 300 ng/mL) Final   POC Cocaine UR 05/13/2024 None Detected  NONE DETECTED (Cut Off Level 300 ng/mL) Final   POC Methamphetamine UR 05/13/2024 None Detected  NONE DETECTED (Cut Off Level 1000 ng/mL) Final   POC Morphine  05/13/2024 None Detected  NONE DETECTED (Cut Off Level 300 ng/mL) Final   POC Methadone UR 05/13/2024 None Detected  NONE DETECTED (Cut Off Level 300 ng/mL) Final   POC Oxycodone  UR 05/13/2024 None Detected  NONE DETECTED (Cut Off Level 100 ng/mL) Final   POC Marijuana UR 05/13/2024 None Detected  NONE DETECTED (Cut Off Level 50 ng/mL) Final  Admission on 01/23/2024, Discharged on 02/02/2024  Component Date Value Ref Range Status   Sodium 01/30/2024 137  135 - 145 mmol/L Final   Potassium 01/30/2024 4.1  3.5 - 5.1 mmol/L Final   Chloride 01/30/2024 101  98 - 111 mmol/L Final   CO2 01/30/2024 30  22 - 32 mmol/L Final   Glucose, Bld 01/30/2024 92  70 - 99 mg/dL Final   Glucose reference range applies only to samples taken after fasting for at least 8 hours.   BUN 01/30/2024 17  6 -  20 mg/dL Final   Creatinine, Ser 01/30/2024 1.03  0.61 - 1.24 mg/dL Final   Calcium  01/30/2024 8.9  8.9 - 10.3 mg/dL Final   Total Protein 95/94/7974 7.2  6.5 - 8.1 g/dL Final   Albumin 95/94/7974 4.1  3.5 - 5.0 g/dL Final   AST 95/94/7974 18  15 - 41 U/L Final   ALT 01/30/2024 19  0 - 44 U/L Final   Alkaline Phosphatase 01/30/2024 43  38 - 126 U/L Final   Total Bilirubin 01/30/2024 0.6  0.0 - 1.2 mg/dL Final   GFR, Estimated 01/30/2024 >60  >60 mL/min Final   Comment: (NOTE) Calculated using the CKD-EPI Creatinine Equation (2021)    Anion gap  01/30/2024 6  5 - 15 Final   Performed at [facility], [address], [place], [place] [address]  Admission on 01/22/2024, Discharged on 01/23/2024  Component Date Value Ref Range Status   WBC 01/22/2024 6.8  4.0 - 10.5 K/uL Final   RBC 01/22/2024 5.22  4.22 - 5.81 MIL/uL Final   Hemoglobin 01/22/2024 17.2 (H)  13.0 - 17.0 g/dL Final   HCT 96/71/7974 47.8  39.0 - 52.0 % Final   MCV 01/22/2024 91.6  80.0 - 100.0 fL Final   MCH 01/22/2024 33.0  26.0 - 34.0 pg Final   MCHC 01/22/2024 36.0  30.0 - 36.0 g/dL Final   RDW 96/71/7974 12.1  11.5 - 15.5 % Final   Platelets 01/22/2024 319  150 - 400 K/uL Final   nRBC 01/22/2024 0.0  0.0 - 0.2 % Final   Neutrophils Relative % 01/22/2024 70  % Final   Neutro Abs 01/22/2024 4.8  1.7 - 7.7 K/uL Final   Lymphocytes Relative 01/22/2024 25  % Final   Lymphs Abs 01/22/2024 1.7  0.7 - 4.0 K/uL Final   Monocytes Relative 01/22/2024 4  % Final   Monocytes Absolute 01/22/2024 0.3  0.1 - 1.0 K/uL Final   Eosinophils Relative 01/22/2024 0  % Final   Eosinophils Absolute 01/22/2024 0.0  0.0 - 0.5 K/uL Final   Basophils Relative 01/22/2024 1  % Final   Basophils Absolute 01/22/2024 0.1  0.0 - 0.1 K/uL Final   Immature Granulocytes 01/22/2024 0  % Final   Abs Immature Granulocytes 01/22/2024 0.02  0.00 - 0.07 K/uL Final   Performed at [facility], [address] [place], [place] [address]   Sodium  01/22/2024 138  135 - 145 mmol/L Final   Potassium 01/22/2024 3.9  3.5 - 5.1 mmol/L Final   Chloride 01/22/2024 99  98 - 111 mmol/L Final   CO2 01/22/2024 25  22 - 32 mmol/L Final   Glucose, Bld 01/22/2024 99  70 - 99 mg/dL Final   Glucose reference range applies only to samples taken after fasting for at least 8 hours.   BUN 01/22/2024 13  6 - 20 mg/dL Final   Creatinine, Ser 01/22/2024 0.83  0.61 - 1.24 mg/dL Final   Calcium  01/22/2024 8.9  8.9 - 10.3 mg/dL Final   Total Protein 96/71/7974 7.5  6.5 - 8.1 g/dL Final   Albumin 96/71/7974 4.6  3.5 - 5.0 g/dL Final   AST 96/71/7974 44 (H)  15 - 41 U/L Final   ALT 01/22/2024 46 (H)  0 - 44 U/L Final   Alkaline Phosphatase 01/22/2024 60  38 - 126 U/L Final   Total Bilirubin 01/22/2024 0.8  0.0 - 1.2 mg/dL Final   GFR, Estimated 01/22/2024 >60  >60 mL/min Final   Comment: (NOTE) Calculated using the CKD-EPI Creatinine Equation (2021)    Anion gap 01/22/2024 14  5 - 15 Final   Performed at [facility], [address], [place], [place] [address]   Hgb A1c MFr Bld 01/22/2024 5.0  4.8 - 5.6 % Final   Comment: (NOTE) Pre diabetes:          5.7%-6.4%  Diabetes:              >6.4%  Glycemic control for   <7.0% adults with diabetes    Mean Plasma Glucose 01/22/2024 96.8  mg/dL Final   Performed at [facility], [address], [place], [place] [address]  Magnesium  01/22/2024 1.9  1.7 - 2.4 mg/dL Final   Performed at [facility], [address] [place], [place] [address]   Alcohol, Ethyl (B) 01/22/2024 109 (H)  <10 mg/dL Final   Comment: (NOTE) Lowest detectable limit for serum alcohol is 10 mg/dL.  For medical purposes only. Performed at [facility], [address], [place], [place] [address]    Cholesterol 01/22/2024 202 (H)  0 - 200 mg/dL Final   Triglycerides 96/71/7974 65  <150 mg/dL Final   HDL 96/71/7974 84  >40 mg/dL Final   Total CHOL/HDL Ratio 01/22/2024 2.4  RATIO Final   VLDL 01/22/2024 13  0 -  40 mg/dL Final   LDL Cholesterol 01/22/2024 105 (H)  0 - 99 mg/dL Final   Comment:        Total Cholesterol/HDL:CHD Risk Coronary Heart Disease Risk Table                     Men   Women  1/2 Average Risk   3.4   3.3  Average Risk       5.0   4.4  2 X Average Risk   9.6   7.1  3 X Average Risk  23.4   11.0        Use the calculated Patient Ratio above and the CHD Risk Table to determine the patient's CHD Risk.        ATP III CLASSIFICATION (LDL):  <100     mg/dL   Optimal  899-870  mg/dL   Near or Above                    Optimal  130-159  mg/dL   Borderline  839-810  mg/dL   High  >809     mg/dL   Very High Performed at [facility], [address], [place], [place] [address]    TSH 01/22/2024 0.458  0.350 - 4.500 uIU/mL Final   Comment: Performed by a 3rd Generation assay with a functional sensitivity of <=0.01 uIU/mL. Performed at [facility], [address] [place], [place] [address]    RPR Ser Ql 01/22/2024 NON REACTIVE  NON REACTIVE Final   Performed at [facility], [address] [place], [place] [address]   Color, Urine 01/22/2024 YELLOW  YELLOW Final   APPearance 01/22/2024 CLEAR  CLEAR Final   Specific Gravity, Urine 01/22/2024 1.020  1.005 - 1.030 Final   pH 01/22/2024 5.0  5.0 - 8.0 Final   Glucose, UA 01/22/2024 NEGATIVE  NEGATIVE mg/dL Final   Hgb urine dipstick 01/22/2024 NEGATIVE  NEGATIVE Final   Bilirubin Urine 01/22/2024 NEGATIVE  NEGATIVE Final   Ketones, ur 01/22/2024 5 (A)  NEGATIVE mg/dL Final   Protein, ur 96/71/7974 NEGATIVE  NEGATIVE mg/dL Final   Nitrite 96/71/7974 NEGATIVE  NEGATIVE Final   Leukocytes,Ua 01/22/2024 NEGATIVE  NEGATIVE Final   Performed at [facility], [address] [place], [place] [address]   POC Amphetamine UR 01/22/2024 None Detected   Final   POC Secobarbital (BAR) 01/22/2024 None Detected   Final   POC Buprenorphine (BUP) 01/22/2024 None Detected   Final   POC Oxazepam (BZO) 01/22/2024 None  Detected   Final   POC Cocaine UR 01/22/2024 None Detected   Final   POC Methamphetamine UR 01/22/2024 None Detected   Final   POC Morphine  01/22/2024 None Detected   Final   POC  Methadone UR 01/22/2024 None Detected   Final   POC Oxycodone  UR 01/22/2024 None Detected   Final   POC Marijuana UR 01/22/2024 None Detected   Final    Blood Alcohol level:  Lab Results  Component Value Date   ETH 356 (HH) 07/10/2024   ETH 52 (H) 05/13/2024    Metabolic Disorder Labs: Lab Results  Component Value Date   HGBA1C 4.8 05/15/2024   MPG 91.06 05/15/2024   MPG 96.8 01/22/2024   No results found for: PROLACTIN Lab Results  Component Value Date   CHOL 187 05/15/2024   TRIG 186 (H) 05/15/2024   HDL 59 05/15/2024   CHOLHDL 3.2 05/15/2024   VLDL 37 05/15/2024   LDLCALC 91 05/15/2024   LDLCALC 105 (H) 01/22/2024    Therapeutic Lab Levels: No results found for: LITHIUM No results found for: VALPROATE No results found for: CBMZ  Physical Findings   AUDIT    Flowsheet Row ED from 05/13/2024 in [facility] ED from [facility]23[facility]2024 [facility]  Alcohol Use Disorder Identification Test Final Score (AUDIT) 26 36   GAD-7    Flowsheet Row Counselor from 10/22/2023 in [facility]  Total GAD-7 Score 8   PHQ2-9    Flowsheet Row ED from 07/10/2024 in [facility] ED from 05/13/2024 in [facility] ED from 01/23/2024 in [facility] ED from 01/22/2024 in [facility] Counselor from 10/22/2023 in [facility]  PHQ-2 Total Score 2 0 3 4 0  PHQ-9 Total Score 7 9 14 12  --   Flowsheet Row ED from 07/10/2024 in [facility] Most recent reading at 07/10/2024  4:50 PM ED from 07/10/2024 in [facility] Department at [facility] Most  recent reading at 07/10/2024  3:07 AM ED from 05/13/2024 in [facility] Most recent reading at 05/13/2024  4:39 PM  C-SSRS RISK CATEGORY No Risk Low Risk No Risk     Musculoskeletal  Strength & Muscle Tone: within normal limits Gait & Station: normal Patient leans: N/A  Psychiatric Specialty Exam  Presentation  General Appearance:  Appropriate for Environment  Eye Contact: Good  Speech: Clear and Coherent  Speech Volume: Normal  Handedness: Right   Mood and Affect  Mood: Anxious  Affect: Congruent   Thought Process  Thought Processes: Goal Directed; Linear  Descriptions of Associations:Intact  Orientation:Full (Time, Place and Person)  Thought Content:Logical  Diagnosis of Schizophrenia or Schizoaffective disorder in past: No    Hallucinations:Hallucinations: None  Ideas of Reference:None  Suicidal Thoughts:Suicidal Thoughts: No  Homicidal Thoughts:Homicidal Thoughts: No   Sensorium  Memory: Immediate Good; Recent Good  Judgment: Good  Insight: Good   Executive Functions  Concentration: Good  Attention Span: Good  Recall: Good  Fund of Knowledge: Good  Language: Good   Psychomotor Activity  Psychomotor Activity: Psychomotor Activity: Tremor   Assets  Assets: Desire for Improvement; Communication Skills; Social Support; Leisure Time   Sleep  Sleep: Sleep: Good  Estimated Sleeping Duration (Last 24 Hours): 7.00-8.50 hours  No data recorded  Physical Exam  Physical Exam Vitals and nursing note reviewed.  Constitutional:      Appearance: Normal appearance.  HENT:     Head: Normocephalic and atraumatic.  Eyes:     Extraocular Movements: Extraocular movements intact.  Pulmonary:     Effort: Pulmonary effort is  normal.  Musculoskeletal:        General: Normal range of motion.     Cervical back: Normal range of motion.  Neurological:     General: No focal deficit present.      Mental Status: He is alert and oriented to person, place, and time.     Motor: Tremor present.  Psychiatric:        Behavior: Behavior normal.    Review of Systems  Constitutional:  Negative for diaphoresis and fever.  Gastrointestinal:  Negative for constipation, diarrhea, nausea and vomiting.  Genitourinary:  Negative for dysuria.  Musculoskeletal:  Negative for joint pain and myalgias.  Skin:  Negative for rash.  Neurological:  Positive for tremors. Negative for dizziness and headaches.  Psychiatric/Behavioral:  Negative for hallucinations and suicidal ideas. The patient is nervous/anxious.    Blood pressure 134/89, pulse 74, temperature 97.8 F (36.6 C), temperature source Oral, resp. rate 18, SpO2 100%. There is no height or weight on file to calculate BMI.  Treatment Plan Summary: Long Term Goals: Improvement in symptoms so as ready for discharge   Short Term Goals: Patient will verbalize feelings in meetings with treatment team members., Patient will attend at least of 50% of the groups daily., Pt will complete the PHQ9 on admission, day 3 and discharge., and Patient will take medications as prescribed daily.   Medications: Mood/anxiety: continue group therapy, milieu therapy, 1:1 evaluation with provider.  Medication management:  Remeron  15mg  PO at bedtime for mood, anxiety, sleep and appetite  Substance Abuse:  Severe alcohol use disorder: replacing thiamine . Encourage adequate PO intake. May require lab monitoring of electrolytes. Benzodiazepine taper as tolerated. Seizure precautions. Monitor for delirium tremens.  Nicotine and tobacco use: N/A Medical: PRNs for pain, constipation, indigestion available.  Labs/studies: no new labs Safety and Monitoring: voluntarily admission to [facility]/Facility based care unit [facility]) unit for safety, stabilization and treatment Daily contact with patient to assess and evaluate symptoms and progress in treatment Patient's case to be discussed  in multi-disciplinary team meeting Observation Level : q15 minute checks Vital signs: q12 hours Precautions: withdrawal   Based on my evaluation the patient does not appear to have an emergency medical condition.  Corean Anette Potters, MD 07/16/2024 2:18 PM

## 2024-07-16 NOTE — ED Notes (Signed)
 Patient is in the dayroom watching TV with other patients. NAD. Denies SI/HI/AVH.  Respirations even and unlabored. Environment secured per policy. Will keep monitoring for safety.

## 2024-07-16 NOTE — ED Notes (Signed)
 Pt sleeping at the moment. Respirations are even and labored. Q15 safety facility checks are in place.

## 2024-07-17 DIAGNOSIS — R45851 Suicidal ideations: Secondary | ICD-10-CM | POA: Diagnosis not present

## 2024-07-17 DIAGNOSIS — F332 Major depressive disorder, recurrent severe without psychotic features: Secondary | ICD-10-CM | POA: Diagnosis not present

## 2024-07-17 DIAGNOSIS — F419 Anxiety disorder, unspecified: Secondary | ICD-10-CM | POA: Diagnosis not present

## 2024-07-17 DIAGNOSIS — F1029 Alcohol dependence with unspecified alcohol-induced disorder: Secondary | ICD-10-CM | POA: Diagnosis not present

## 2024-07-17 MED ORDER — MIRTAZAPINE 15 MG PO TABS: 15.0000 mg | ORAL_TABLET | Freq: Every day | ORAL | 0 refills | Status: AC

## 2024-07-17 MED ORDER — STRESS FORMULA/ZINC PO TABS: 1.0000 | ORAL_TABLET | Freq: Every day | ORAL | 0 refills | Status: AC

## 2024-07-17 MED ORDER — HYDROXYZINE HCL 25 MG PO TABS: 25.0000 mg | ORAL_TABLET | Freq: Three times a day (TID) | ORAL | 0 refills | Status: AC | PRN

## 2024-07-17 NOTE — ED Notes (Signed)
Pt in dayroom at this hour. No apparent distress. RR even and unlabored. Monitored for safety.  

## 2024-07-17 NOTE — ED Notes (Signed)
Patient asleep, NAD 

## 2024-07-17 NOTE — Progress Notes (Signed)
 Pt is awake, alert and oriented X4. Pt did not voice any complaints of pain or discomfort. No signs of acute distress noted. Administered scheduled med per order. Pt denies currently SI/HI/AVH, plan or intent. Staff will monitor for pt's safety.

## 2024-07-17 NOTE — Progress Notes (Signed)
 Pt was visible in the milieu and has been interacting with peers. No distress noted or concerns voiced. Staff will monitor for pt's safety.

## 2024-07-17 NOTE — Group Note (Signed)
 Group Topic: Relaxation  Group Date: 07/17/2024 Start Time: 1745 End Time: 1815 Facilitators: Kemani Heidel, Zane HERO, RN; Sherleen Primmer, RN  Department: [facility]  Number of Participants: 5  Group Focus: relaxation Treatment Modality:  Individual Therapy Interventions utilized were support Purpose: reinforce self-care, explore coping skills  Name: Trevor [name] Date of Birth: [birth year]  MR: [id number]    Level of Participation: active Quality of Participation: attentive, cooperative, and engaged Interactions with others: gave feedback Mood/Affect: appropriate Triggers (if applicable): None identified at this time Cognition: coherent/clear and logical Progress: Gaining insight Response: Patient enjoyed fresh air and worked on [employer] techn[employer]. Social support provided with staff and peers.  Plan: patient will be encouraged to continue to attend groups/programming on the unit  Patients Problems:  Patient Active Problem List   Diagnosis Date Noted   MDD (major depressive disorder) 07/10/2024   Moderate episode of recurrent major depressive disorder (HCC) 01/30/2024   GAD (generalized anxiety disorder) 01/30/2024   Transaminitis 06/18/2022   GERD (gastroesophageal reflux disease) 06/18/2022   Hypokalemia 06/18/2022   Chronic hepatitis C without hepatic coma (HCC) 06/28/2019   Psoriasis 06/07/2019   H/O withdrawal symptoms, alcohol, with delirium and seizures (HCC) 06/07/2019   Alcohol use disorder, severe, dependence (HCC) 06/03/2019   Obesity (BMI 30-39.9) 01/09/2016   HSV-2 (herpes simplex virus 2) infection

## 2024-07-17 NOTE — ED Provider Notes (Signed)
 Behavioral Health Progress Note  Date and Time: 07/17/2024 2:36 PM Name: Trevor [name] MRN:  [id number]  Subjective:  Trevor [name] was seen in the common area on rounds today. He is sleeping and eating well. He had some anxiety this morning but it is better with the hydroxyzine . He does not feel that he has any withdrawal today, and no new physical complaints. He feels that he will be ready to go on Monday to the rehab program. He feels that his family and friends are supportive.   Diagnosis:  Final diagnoses:  Alcohol use disorder  Severe episode of recurrent major depressive disorder, without psychotic features (HCC)    Total Time spent with patient: 15 minutes  Past Psychiatric History: Alcohol use disorder  Past Medical History: Hep C Family History: Grandparents had alcohol problems. Brother is recovered from alcohol Social History: Divorced. Lives with his mother. Has a 43 year-old daughter.   Sleep: Good  Appetite:  Good  Current Medications:  Current Facility-Administered Medications  Medication Dose Route Frequency Provider Last Rate Last Admin   acetaminophen  (TYLENOL ) tablet 650 mg  650 mg Oral Q6H PRN Randall Starlyn HERO, NP   650 mg at 07/16/24 0914   alum & mag hydroxide-simeth (MAALOX/MYLANTA) 200-200-20 MG/5ML suspension 30 mL  30 mL Oral Q4H PRN Motley-Mangrum, Jadeka A, PMHNP       haloperidol  (HALDOL ) tablet 5 mg  5 mg Oral TID PRN Motley-Mangrum, Jadeka A, PMHNP       And   diphenhydrAMINE  (BENADRYL ) capsule 50 mg  50 mg Oral TID PRN Motley-Mangrum, Jadeka A, PMHNP   50 mg at 07/11/24 1110   haloperidol  lactate (HALDOL ) injection 5 mg  5 mg Intramuscular TID PRN Motley-Mangrum, Jadeka A, PMHNP       And   diphenhydrAMINE  (BENADRYL ) injection 50 mg  50 mg Intramuscular TID PRN Motley-Mangrum, Jadeka A, PMHNP       And   LORazepam  (ATIVAN ) injection 2 mg  2 mg Intramuscular TID PRN Motley-Mangrum, Jadeka A, PMHNP       haloperidol  lactate (HALDOL )  injection 10 mg  10 mg Intramuscular TID PRN Motley-Mangrum, Jadeka A, PMHNP       And   diphenhydrAMINE  (BENADRYL ) injection 50 mg  50 mg Intramuscular TID PRN Motley-Mangrum, Jadeka A, PMHNP       And   LORazepam  (ATIVAN ) injection 2 mg  2 mg Intramuscular TID PRN Motley-Mangrum, Jadeka A, PMHNP       folic acid  (FOLVITE ) tablet 1 mg  1 mg Oral Daily Motley-Mangrum, Jadeka A, PMHNP   1 mg at 07/17/24 0834   hydrOXYzine  (ATARAX ) tablet 25 mg  25 mg Oral TID PRN Leigh Corean Massa, MD   25 mg at 07/17/24 0834   magnesium  hydroxide (MILK OF MAGNESIA) suspension 30 mL  30 mL Oral Daily PRN Motley-Mangrum, Jadeka A, PMHNP   30 mL at 07/16/24 0913   mirtazapine  (REMERON ) tablet 15 mg  15 mg Oral QHS Melaine Mcphee, Corean Massa, MD   15 mg at 07/16/24 2105   multivitamin with minerals tablet 1 tablet  1 tablet Oral Daily Motley-Mangrum, Jadeka A, PMHNP   1 tablet at 07/17/24 0834   thiamine  (VITAMIN B1) tablet 100 mg  100 mg Oral Daily Motley-Mangrum, Jadeka A, PMHNP   100 mg at 07/17/24 9165   Or   thiamine  (VITAMIN B1) injection 100 mg  100 mg Intravenous Daily Motley-Mangrum, Jadeka A, PMHNP       Current Outpatient Medications  Medication Sig  Dispense Refill   hydrOXYzine  (ATARAX ) 25 MG tablet Take 1 tablet (25 mg total) by mouth every 6 (six) hours as needed for anxiety. 60 tablet 0   Multiple Vitamin (MULTIVITAMIN WITH MINERALS) TABS tablet Take 1 tablet by mouth daily.     naltrexone  380 MG SUSR IM injection Inject 380 mg into the muscle every 28 (twenty-eight) days.     sertraline  (ZOLOFT ) 50 MG tablet Take 50 mg by mouth daily.     valACYclovir  (VALTREX ) 500 MG tablet Take 500 mg by mouth daily as needed (For outbreaks).      Labs  Lab Results:  Admission on 07/10/2024, Discharged on 07/10/2024  Component Date Value Ref Range Status   Sodium 07/10/2024 139  135 - 145 mmol/L Final   Potassium 07/10/2024 3.4 (L)  3.5 - 5.1 mmol/L Final   Chloride 07/10/2024 102  98 - 111 mmol/L Final    CO2 07/10/2024 23  22 - 32 mmol/L Final   Glucose, Bld 07/10/2024 111 (H)  70 - 99 mg/dL Final   Glucose reference range applies only to samples taken after fasting for at least 8 hours.   BUN 07/10/2024 15  6 - 20 mg/dL Final   Creatinine, Ser 07/10/2024 0.80  0.61 - 1.24 mg/dL Final   Calcium  07/10/2024 8.2 (L)  8.9 - 10.3 mg/dL Final   Total Protein 90/85/7974 7.8  6.5 - 8.1 g/dL Final   Albumin 90/85/7974 4.4  3.5 - 5.0 g/dL Final   AST 90/85/7974 29  15 - 41 U/L Final   ALT 07/10/2024 33  0 - 44 U/L Final   Alkaline Phosphatase 07/10/2024 54  38 - 126 U/L Final   Total Bilirubin 07/10/2024 0.7  0.0 - 1.2 mg/dL Final   GFR, Estimated 07/10/2024 >60  >60 mL/min Final   Comment: (NOTE) Calculated using the CKD-EPI Creatinine Equation (2021)    Anion gap 07/10/2024 14  5 - 15 Final   Performed at [facility], [address], [place], [place] [address]   Alcohol, Ethyl (B) 07/10/2024 356 (HH)  <15 mg/dL Final   Comment: CRITICAL RESULT CALLED TO, READ BACK BY AND VERIFIED WITH PRUITT,G ON 07/10/24 AT 0352 BY PURDIE,J (NOTE) For medical purposes only. Performed at [facility], [address] [place], [place] [address]    WBC 07/10/2024 8.4  4.0 - 10.5 K/uL Final   RBC 07/10/2024 5.16  4.22 - 5.81 MIL/uL Final   Hemoglobin 07/10/2024 16.7  13.0 - 17.0 g/dL Final   HCT 90/85/7974 46.7  39.0 - 52.0 % Final   MCV 07/10/2024 90.5  80.0 - 100.0 fL Final   MCH 07/10/2024 32.4  26.0 - 34.0 pg Final   MCHC 07/10/2024 35.8  30.0 - 36.0 g/dL Final   RDW 90/85/7974 12.8  11.5 - 15.5 % Final   Platelets 07/10/2024 344  150 - 400 K/uL Final   nRBC 07/10/2024 0.0  0.0 - 0.2 % Final   Performed at [facility], [address], [place], [place] [address]   Opiates 07/10/2024 NONE DETECTED  NONE DETECTED Final   Cocaine 07/10/2024 NONE DETECTED  NONE DETECTED Final   Benzodiazepines 07/10/2024 NONE DETECTED  NONE DETECTED Final   Amphetamines 07/10/2024 NONE DETECTED  NONE DETECTED Final    Tetrahydrocannabinol 07/10/2024 NONE DETECTED  NONE DETECTED Final   Barbiturates 07/10/2024 NONE DETECTED  NONE DETECTED Final   Comment: (NOTE) DRUG SCREEN FOR MEDICAL PURPOSES ONLY.  IF CONFIRMATION IS NEEDED FOR ANY PURPOSE, NOTIFY LAB WITHIN  5 DAYS.  LOWEST DETECTABLE LIMITS FOR URINE DRUG SCREEN Drug Class                     Cutoff (ng/mL) Amphetamine and metabolites    1000 Barbiturate and metabolites    200 Benzodiazepine                 200 Opiates and metabolites        300 Cocaine and metabolites        300 THC                            50 Performed at [facility], [address], [place], [place] [address]    Magnesium  07/10/2024 2.2  1.7 - 2.4 mg/dL Final   Performed at [facility], [address] [place], [place] [address]  Admission on 05/13/2024, Discharged on 05/20/2024  Component Date Value Ref Range Status   Hgb A1c MFr Bld 05/15/2024 4.8  4.8 - 5.6 % Final   Comment: (NOTE) Diagnosis of Diabetes The following HbA1c ranges recommended by the American Diabetes Association (ADA) may be used as an aid in the diagnosis of diabetes mellitus.  Hemoglobin             Suggested A1C NGSP%              Diagnosis  <5.7                   Non Diabetic  5.7-6.4                Pre-Diabetic  >6.4                   Diabetic  <7.0                   Glycemic control for                       adults with diabetes.     Mean Plasma Glucose 05/15/2024 91.06  mg/dL Final   Performed at [facility], [address], [place], [place] [address]   Cholesterol 05/15/2024 187  0 - 200 mg/dL Final   Triglycerides 92/79/7974 186 (H)  <150 mg/dL Final   HDL 92/79/7974 59  >40 mg/dL Final   Total CHOL/HDL Ratio 05/15/2024 3.2  RATIO Final   VLDL 05/15/2024 37  0 - 40 mg/dL Final   LDL Cholesterol 05/15/2024 91  0 - 99 mg/dL Final   Comment:        Total Cholesterol/HDL:CHD Risk Coronary Heart Disease Risk Table                     Men   Women  1/2 Average Risk   3.4    3.3  Average Risk       5.0   4.4  2 X Average Risk   9.6   7.1  3 X Average Risk  23.4   11.0        Use the calculated Patient Ratio above and the CHD Risk Table to determine the patient's CHD Risk.        ATP III CLASSIFICATION (LDL):  <100     mg/dL   Optimal  899-870  mg/dL   Near or Above  Optimal  130-159  mg/dL   Borderline  839-810  mg/dL   High  >809     mg/dL   Very High Performed at [facility], [address], [place], [place] [address]    Vit D, 25-Hydroxy 05/15/2024 78.38  30 - 100 ng/mL Final   Comment: (NOTE) Vitamin D  deficiency has been defined by the Institute of Medicine  and an Endocrine Society practice guideline as a level of serum 25-OH  vitamin D  less than 20 ng/mL (1,2). The Endocrine Society went on to  further define vitamin D  insufficiency as a level between 21 and 29  ng/mL (2).  1. IOM (Institute of Medicine). 2010. Dietary reference intakes for  calcium  and D. [place]: [employer]. 2. Holick MF, Binkley [place], Bischoff-Ferrari HA, et al. Evaluation,  treatment, and prevention of vitamin D  deficiency: an Endocrine  Society clinical practice guideline, JCEM. 2011 Jul; 96(7): 1911-30.  Performed at [facility], [address] [place], [place] [address]    Vitamin B-12 05/15/2024 265  180 - 914 pg/mL Final   Comment: (NOTE) This assay is not validated for testing neonatal or myeloproliferative syndrome specimens for Vitamin B12 levels. Performed at [facility], [address] [place], [place] [address]    Magnesium  05/15/2024 1.9  1.7 - 2.4 mg/dL Final   Performed at [facility], [address] [place], [place] [address]   TSH 05/15/2024 1.255  0.350 - 4.500 uIU/mL Final   Comment: Performed by a 3rd Generation assay with a functional sensitivity of <=0.01 uIU/mL. Performed at [facility], [address], [place], [place] [address]   Admission on 05/13/2024,  Discharged on 05/13/2024  Component Date Value Ref Range Status   WBC 05/13/2024 8.3  4.0 - 10.5 K/uL Final   RBC 05/13/2024 5.19  4.22 - 5.81 MIL/uL Final   Hemoglobin 05/13/2024 16.7  13.0 - 17.0 g/dL Final   HCT 92/81/7974 48.0  39.0 - 52.0 % Final   MCV 05/13/2024 92.5  80.0 - 100.0 fL Final   MCH 05/13/2024 32.2  26.0 - 34.0 pg Final   MCHC 05/13/2024 34.8  30.0 - 36.0 g/dL Final   RDW 92/81/7974 13.5  11.5 - 15.5 % Final   Platelets 05/13/2024 329  150 - 400 K/uL Final   nRBC 05/13/2024 0.0  0.0 - 0.2 % Final   Neutrophils Relative % 05/13/2024 80  % Final   Neutro Abs 05/13/2024 6.6  1.7 - 7.7 K/uL Final   Lymphocytes Relative 05/13/2024 14  % Final   Lymphs Abs 05/13/2024 1.2  0.7 - 4.0 K/uL Final   Monocytes Relative 05/13/2024 5  % Final   Monocytes Absolute 05/13/2024 0.4  0.1 - 1.0 K/uL Final   Eosinophils Relative 05/13/2024 0  % Final   Eosinophils Absolute 05/13/2024 0.0  0.0 - 0.5 K/uL Final   Basophils Relative 05/13/2024 0  % Final   Basophils Absolute 05/13/2024 0.0  0.0 - 0.1 K/uL Final   Immature Granulocytes 05/13/2024 1  % Final   Abs Immature Granulocytes 05/13/2024 0.04  0.00 - 0.07 K/uL Final   Performed at [facility], [address], [place], [place] [address]   Sodium 05/13/2024 138  135 - 145 mmol/L Final   Potassium 05/13/2024 3.9  3.5 - 5.1 mmol/L Final   Chloride 05/13/2024 97 (L)  98 - 111 mmol/L Final   CO2 05/13/2024 30  22 -  32 mmol/L Final   Glucose, Bld 05/13/2024 103 (H)  70 - 99 mg/dL Final   Glucose reference range applies only to samples taken after fasting for at least 8 hours.   BUN 05/13/2024 8  6 - 20 mg/dL Final   Creatinine, Ser 05/13/2024 0.82  0.61 - 1.24 mg/dL Final   Calcium  05/13/2024 9.0  8.9 - 10.3 mg/dL Final   Total Protein 92/81/7974 7.1  6.5 - 8.1 g/dL Final   Albumin 92/81/7974 4.1  3.5 - 5.0 g/dL Final   AST 92/81/7974 23  15 - 41 U/L Final   ALT 05/13/2024 21  0 - 44 U/L Final   Alkaline Phosphatase 05/13/2024  80  38 - 126 U/L Final   Total Bilirubin 05/13/2024 0.8  0.0 - 1.2 mg/dL Final   GFR, Estimated 05/13/2024 >60  >60 mL/min Final   Comment: (NOTE) Calculated using the CKD-EPI Creatinine Equation (2021)    Anion gap 05/13/2024 11  5 - 15 Final   Performed at [facility], [address] [place], [place] [address]   Magnesium  05/13/2024 1.7  1.7 - 2.4 mg/dL Final   Performed at [facility], [address], [place], [place] [address]   Alcohol, Ethyl (B) 05/13/2024 52 (H)  <15 mg/dL Final   Comment: (NOTE) For medical purposes only. Performed at [facility], [address], [place], [place] [address]    POC Amphetamine UR 05/13/2024 None Detected  NONE DETECTED (Cut Off Level 1000 ng/mL) Final   POC Secobarbital (BAR) 05/13/2024 None Detected  NONE DETECTED (Cut Off Level 300 ng/mL) Final   POC Buprenorphine (BUP) 05/13/2024 None Detected  NONE DETECTED (Cut Off Level 10 ng/mL) Final   POC Oxazepam (BZO) 05/13/2024 None Detected  NONE DETECTED (Cut Off Level 300 ng/mL) Final   POC Cocaine UR 05/13/2024 None Detected  NONE DETECTED (Cut Off Level 300 ng/mL) Final   POC Methamphetamine UR 05/13/2024 None Detected  NONE DETECTED (Cut Off Level 1000 ng/mL) Final   POC Morphine  05/13/2024 None Detected  NONE DETECTED (Cut Off Level 300 ng/mL) Final   POC Methadone UR 05/13/2024 None Detected  NONE DETECTED (Cut Off Level 300 ng/mL) Final   POC Oxycodone  UR 05/13/2024 None Detected  NONE DETECTED (Cut Off Level 100 ng/mL) Final   POC Marijuana UR 05/13/2024 None Detected  NONE DETECTED (Cut Off Level 50 ng/mL) Final  Admission on 01/23/2024, Discharged on 02/02/2024  Component Date Value Ref Range Status   Sodium 01/30/2024 137  135 - 145 mmol/L Final   Potassium 01/30/2024 4.1  3.5 - 5.1 mmol/L Final   Chloride 01/30/2024 101  98 - 111 mmol/L Final   CO2 01/30/2024 30  22 - 32 mmol/L Final   Glucose, Bld 01/30/2024 92  70 - 99 mg/dL Final   Glucose reference range  applies only to samples taken after fasting for at least 8 hours.   BUN 01/30/2024 17  6 - 20 mg/dL Final   Creatinine, Ser 01/30/2024 1.03  0.61 - 1.24 mg/dL Final   Calcium  01/30/2024 8.9  8.9 - 10.3 mg/dL Final   Total Protein 95/94/7974 7.2  6.5 - 8.1 g/dL Final   Albumin 95/94/7974 4.1  3.5 - 5.0 g/dL Final   AST 95/94/7974 18  15 - 41 U/L Final   ALT 01/30/2024 19  0 - 44 U/L Final   Alkaline Phosphatase 01/30/2024 43  38 - 126 U/L Final   Total Bilirubin 01/30/2024 0.6  0.0 - 1.2 mg/dL Final   GFR, Estimated 01/30/2024 >60  >60 mL/min Final   Comment: (NOTE) Calculated using the CKD-EPI Creatinine Equation (2021)    Anion gap 01/30/2024 6  5 - 15 Final   Performed at [facility], [address], [place], [place] [address]  Admission on 01/22/2024, Discharged on 01/23/2024  Component Date Value Ref Range Status   WBC 01/22/2024 6.8  4.0 - 10.5 K/uL Final   RBC 01/22/2024 5.22  4.22 - 5.81 MIL/uL Final   Hemoglobin 01/22/2024 17.2 (H)  13.0 - 17.0 g/dL Final   HCT 96/71/7974 47.8  39.0 - 52.0 % Final   MCV 01/22/2024 91.6  80.0 - 100.0 fL Final   MCH 01/22/2024 33.0  26.0 - 34.0 pg Final   MCHC 01/22/2024 36.0  30.0 - 36.0 g/dL Final   RDW 96/71/7974 12.1  11.5 - 15.5 % Final   Platelets 01/22/2024 319  150 - 400 K/uL Final   nRBC 01/22/2024 0.0  0.0 - 0.2 % Final   Neutrophils Relative % 01/22/2024 70  % Final   Neutro Abs 01/22/2024 4.8  1.7 - 7.7 K/uL Final   Lymphocytes Relative 01/22/2024 25  % Final   Lymphs Abs 01/22/2024 1.7  0.7 - 4.0 K/uL Final   Monocytes Relative 01/22/2024 4  % Final   Monocytes Absolute 01/22/2024 0.3  0.1 - 1.0 K/uL Final   Eosinophils Relative 01/22/2024 0  % Final   Eosinophils Absolute 01/22/2024 0.0  0.0 - 0.5 K/uL Final   Basophils Relative 01/22/2024 1  % Final   Basophils Absolute 01/22/2024 0.1  0.0 - 0.1 K/uL Final   Immature Granulocytes 01/22/2024 0  % Final   Abs Immature Granulocytes 01/22/2024 0.02  0.00 - 0.07 K/uL  Final   Performed at [facility], [address], [place], [place] [address]   Sodium 01/22/2024 138  135 - 145 mmol/L Final   Potassium 01/22/2024 3.9  3.5 - 5.1 mmol/L Final   Chloride 01/22/2024 99  98 - 111 mmol/L Final   CO2 01/22/2024 25  22 - 32 mmol/L Final   Glucose, Bld 01/22/2024 99  70 - 99 mg/dL Final   Glucose reference range applies only to samples taken after fasting for at least 8 hours.   BUN 01/22/2024 13  6 - 20 mg/dL Final   Creatinine, Ser 01/22/2024 0.83  0.61 - 1.24 mg/dL Final   Calcium  01/22/2024 8.9  8.9 - 10.3 mg/dL Final   Total Protein 96/71/7974 7.5  6.5 - 8.1 g/dL Final   Albumin 96/71/7974 4.6  3.5 - 5.0 g/dL Final   AST 96/71/7974 44 (H)  15 - 41 U/L Final   ALT 01/22/2024 46 (H)  0 - 44 U/L Final   Alkaline Phosphatase 01/22/2024 60  38 - 126 U/L Final   Total Bilirubin 01/22/2024 0.8  0.0 - 1.2 mg/dL Final   GFR, Estimated 01/22/2024 >60  >60 mL/min Final   Comment: (NOTE) Calculated using the CKD-EPI Creatinine Equation (2021)    Anion gap 01/22/2024 14  5 - 15 Final   Performed at [facility], [address] [place], [place] [address]   Hgb A1c MFr Bld 01/22/2024 5.0  4.8 - 5.6 % Final   Comment: (NOTE) Pre diabetes:          5.7%-6.4%  Diabetes:              >6.4%  Glycemic control for   <  7.0% adults with diabetes    Mean Plasma Glucose 01/22/2024 96.8  mg/dL Final   Performed at [facility], [address], [place], [place] [address]   Magnesium  01/22/2024 1.9  1.7 - 2.4 mg/dL Final   Performed at [facility], [address], [place], [place] [address]   Alcohol, Ethyl (B) 01/22/2024 109 (H)  <10 mg/dL Final   Comment: (NOTE) Lowest detectable limit for serum alcohol is 10 mg/dL.  For medical purposes only. Performed at [facility], [address], [place], [place] [address]    Cholesterol 01/22/2024 202 (H)  0 - 200 mg/dL Final   Triglycerides 96/71/7974 65  <150 mg/dL Final   HDL 96/71/7974 84   >40 mg/dL Final   Total CHOL/HDL Ratio 01/22/2024 2.4  RATIO Final   VLDL 01/22/2024 13  0 - 40 mg/dL Final   LDL Cholesterol 01/22/2024 105 (H)  0 - 99 mg/dL Final   Comment:        Total Cholesterol/HDL:CHD Risk Coronary Heart Disease Risk Table                     Men   Women  1/2 Average Risk   3.4   3.3  Average Risk       5.0   4.4  2 X Average Risk   9.6   7.1  3 X Average Risk  23.4   11.0        Use the calculated Patient Ratio above and the CHD Risk Table to determine the patient's CHD Risk.        ATP III CLASSIFICATION (LDL):  <100     mg/dL   Optimal  899-870  mg/dL   Near or Above                    Optimal  130-159  mg/dL   Borderline  839-810  mg/dL   High  >809     mg/dL   Very High Performed at [facility], [address] [place], [place] [address]    TSH 01/22/2024 0.458  0.350 - 4.500 uIU/mL Final   Comment: Performed by a 3rd Generation assay with a functional sensitivity of <=0.01 uIU/mL. Performed at [facility], [address], [place], [place] [address]    RPR Ser Ql 01/22/2024 NON REACTIVE  NON REACTIVE Final   Performed at [facility], [address], [place], [place] [address]   Color, Urine 01/22/2024 YELLOW  YELLOW Final   APPearance 01/22/2024 CLEAR  CLEAR Final   Specific Gravity, Urine 01/22/2024 1.020  1.005 - 1.030 Final   pH 01/22/2024 5.0  5.0 - 8.0 Final   Glucose, UA 01/22/2024 NEGATIVE  NEGATIVE mg/dL Final   Hgb urine dipstick 01/22/2024 NEGATIVE  NEGATIVE Final   Bilirubin Urine 01/22/2024 NEGATIVE  NEGATIVE Final   Ketones, ur 01/22/2024 5 (A)  NEGATIVE mg/dL Final   Protein, ur 96/71/7974 NEGATIVE  NEGATIVE mg/dL Final   Nitrite 96/71/7974 NEGATIVE  NEGATIVE Final   Leukocytes,Ua 01/22/2024 NEGATIVE  NEGATIVE Final   Performed at [facility], [address] [place], [place] [address]   POC Amphetamine UR 01/22/2024 None Detected   Final   POC Secobarbital (BAR) 01/22/2024 None Detected   Final    POC Buprenorphine (BUP) 01/22/2024 None Detected   Final   POC Oxazepam (BZO) 01/22/2024 None Detected   Final   POC  Cocaine UR 01/22/2024 None Detected   Final   POC Methamphetamine UR 01/22/2024 None Detected   Final   POC Morphine  01/22/2024 None Detected   Final   POC Methadone UR 01/22/2024 None Detected   Final   POC Oxycodone  UR 01/22/2024 None Detected   Final   POC Marijuana UR 01/22/2024 None Detected   Final    Blood Alcohol level:  Lab Results  Component Value Date   ETH 356 (HH) 07/10/2024   ETH 52 (H) 05/13/2024    Metabolic Disorder Labs: Lab Results  Component Value Date   HGBA1C 4.8 05/15/2024   MPG 91.06 05/15/2024   MPG 96.8 01/22/2024   No results found for: PROLACTIN Lab Results  Component Value Date   CHOL 187 05/15/2024   TRIG 186 (H) 05/15/2024   HDL 59 05/15/2024   CHOLHDL 3.2 05/15/2024   VLDL 37 05/15/2024   LDLCALC 91 05/15/2024   LDLCALC 105 (H) 01/22/2024    Therapeutic Lab Levels: No results found for: LITHIUM No results found for: VALPROATE No results found for: CBMZ  Physical Findings   AUDIT    Flowsheet Row ED from 05/13/2024 in [facility] ED from 01/23/2024 in [facility]  Alcohol Use Disorder Identification Test Final Score (AUDIT) 26 36   GAD-7    Flowsheet Row Counselor from 10/22/2023 in [facility]  Total GAD-7 Score 8   PHQ2-9    Flowsheet Row ED from 07/10/2024 in [facility] ED from 05/13/2024 in [facility] ED from 01/23/2024 in [facility] ED from 01/22/2024 in [facility] Counselor from 10/22/2023 in [facility]  PHQ-2 Total Score 2 0 3 4 0  PHQ-9 Total Score 7 9 14 12  --   Flowsheet Row ED from 07/10/2024 in [facility] Most recent reading at 07/10/2024   4:50 PM ED from 07/10/2024 in [facility] Emergency Department at [facility] Most recent reading at 07/10/2024  3:07 AM ED from 05/13/2024 in [facility] Most recent reading at 05/13/2024  4:39 PM  C-SSRS RISK CATEGORY No Risk Low Risk No Risk     Musculoskeletal  Strength & Muscle Tone: within normal limits Gait & Station: normal Patient leans: N/A  Psychiatric Specialty Exam  Presentation  General Appearance:  Casual; Appropriate for Environment  Eye Contact: Good  Speech: Normal Rate  Speech Volume: Normal  Handedness: Right   Mood and Affect  Mood: Euthymic  Affect: Congruent   Thought Process  Thought Processes: Linear  Descriptions of Associations:Intact  Orientation:Full (Time, Place and Person)  Thought Content:Logical  Diagnosis of Schizophrenia or Schizoaffective disorder in past: No    Hallucinations:Hallucinations: None  Ideas of Reference:None  Suicidal Thoughts:Suicidal Thoughts: No  Homicidal Thoughts:Homicidal Thoughts: No   Sensorium  Memory: Immediate Good; Recent Good; Remote Good  Judgment: Good  Insight: Good   Executive Functions  Concentration: Good  Attention Span: Good  Recall: Good  Fund of Knowledge: Good  Language: Good   Psychomotor Activity  Psychomotor Activity: Psychomotor Activity: Normal   Assets  Assets: Communication Skills; Desire for Improvement; Leisure Time; Physical Health; Social Support   Sleep  Sleep: Sleep: Good  Estimated Sleeping Duration (Last 24 Hours): 7.50-9.00 hours  No data recorded  Physical Exam  Physical Exam Vitals and nursing note reviewed.  Constitutional:  Appearance: Normal appearance.  HENT:     Head: Normocephalic and atraumatic.  Eyes:     Extraocular Movements: Extraocular movements intact.  Pulmonary:     Effort: Pulmonary effort is normal.  Musculoskeletal:        General: Normal range of motion.      Cervical back: Normal range of motion.  Neurological:     General: No focal deficit present.     Mental Status: He is alert and oriented to person, place, and time.  Psychiatric:        Behavior: Behavior normal.    Review of Systems  Constitutional:  Negative for chills, diaphoresis and fever.  Respiratory:  Negative for cough and shortness of breath.   Cardiovascular:  Negative for chest pain.  Gastrointestinal:  Negative for constipation, diarrhea, nausea and vomiting.  Genitourinary:  Negative for dysuria.  Musculoskeletal:  Negative for joint pain and myalgias.  Neurological:  Negative for tremors and headaches.  Psychiatric/Behavioral:  Negative for hallucinations and suicidal ideas.    Blood pressure 122/83, pulse 76, temperature 98.3 F (36.8 C), temperature source Oral, resp. rate 16, SpO2 97%. There is no height or weight on file to calculate BMI.  Treatment Plan Summary: Long Term Goals: Improvement in symptoms so as ready for discharge   Short Term Goals: Patient will verbalize feelings in meetings with treatment team members., Patient will attend at least of 50% of the groups daily., Pt will complete the PHQ9 on admission, day 3 and discharge., and Patient will take medications as prescribed daily.   Medications: Mood/anxiety: continue group therapy, milieu therapy, 1:1 evaluation with provider.  Medication management:  Remeron  15mg  PO at bedtime for mood, anxiety, sleep and appetite  Substance Abuse:  Severe alcohol use disorder: replacing thiamine . Encourage adequate PO intake. May require lab monitoring of electrolytes. Benzodiazepine taper as tolerated. Seizure precautions. Monitor for delirium tremens.  Nicotine and tobacco use: N/A Medical: PRNs for pain, constipation, indigestion available.  Labs/studies: no new labs Safety and Monitoring: voluntarily admission to [facility]/Facility based care unit [facility]) unit for safety, stabilization and treatment Daily  contact with patient to assess and evaluate symptoms and progress in treatment Patient's case to be discussed in multi-disciplinary team meeting Observation Level : q15 minute checks Vital signs: q12 hours Precautions: withdrawal   Based on my evaluation the patient does not appear to have an emergency medical condition.  Corean Anette Potters, MD 07/17/2024 2:36 PM

## 2024-07-17 NOTE — ED Provider Notes (Signed)
 FBC/OBS ASAP Discharge Summary  Date and Time: 07/17/2024 3:24 PM  Name: Trevor [name]  MRN:  [id number]   Discharge Diagnoses:  Final diagnoses:  Alcohol use disorder  Severe episode of recurrent major depressive disorder, without psychotic features [facility])    Subjective: patient to discharge early in the morning on Monday, and will be transported by family to [address] substance abuse residential treatment program.  Stay Summary: During the course of patient's hospitalization, the 15-minute checks were adequate to ensure patient's safety. Patient did not exhibit erratic or aggressive behavior and was compliant with scheduled medication. Patient was recommended for outpatient psychiatry follow-up.  At the time of discharge patient is not reporting any acute suicidal/homicidal ideations/AVH, delusional thoughts or paranoia. Patient did not appear to be responding to any internal stimuli. Patient feels more confident about self-care & in managing their mental health problems. Patient currently denies any new issues or concerns. Education and supportive counseling provided throughout patient's hospital stay & upon discharge.  patient was safely detoxed fom alcohol during the course of his stay, and any symptoms of withdrawal were addressed and have since resolved. At time of discharge the patient is not experiencing clinical syptoms of withdrawal and denies having any subjective symptoms. At time of discharge CIWA was 0.   Today upon discharge evaluation, the patient gives a mood of appropriate to circumstances. Patient denies any specific concerns and has no new physical complaints. Patient slept well, appetite good, regular bowel movements. Patient feels that the medications have been helpful & is in agreement to continue current treatment regimen as recommended. Patient was able to engage in safety planning including plan to return to [facility]Facility based care unit [facility] the nearest emergency  room or contact emergency services if patient feels unable to maintain their own safety or the safety of others. Patient had no further questions, comments, or concerns. Patient left [facility]/Facility based care unit [facility]) with all personal belongings in no apparent distress. Transportation per safe transport to home was arranged for patient.  Total Time spent with patient: 15 minutes  Past Psychiatric History: Alcohol use disorder  Past Medical History: Hep C Family History: Grandparents had alcohol problems. Brother is recovered from alcohol Social History: Divorced. Lives with his mother. Has a 36 year-old daughter.  Tobacco Cessation:  N/A, patient does not currently use tobacco products  Current Medications:  Current Facility-Administered Medications  Medication Dose Route Frequency Provider Last Rate Last Admin   acetaminophen  (TYLENOL ) tablet 650 mg  650 mg Oral Q6H PRN Randall Starlyn HERO, NP   650 mg at 07/16/24 0914   alum & mag hydroxide-simeth (MAALOX/MYLANTA) 200-200-20 MG/5ML suspension 30 mL  30 mL Oral Q4H PRN Motley-Mangrum, Jadeka A, PMHNP       haloperidol  (HALDOL ) tablet 5 mg  5 mg Oral TID PRN Motley-Mangrum, Jadeka A, PMHNP       And   diphenhydrAMINE  (BENADRYL ) capsule 50 mg  50 mg Oral TID PRN Motley-Mangrum, Jadeka A, PMHNP   50 mg at 07/11/24 1110   haloperidol  lactate (HALDOL ) injection 5 mg  5 mg Intramuscular TID PRN Motley-Mangrum, Jadeka A, PMHNP       And   diphenhydrAMINE  (BENADRYL ) injection 50 mg  50 mg Intramuscular TID PRN Motley-Mangrum, Jadeka A, PMHNP       And   LORazepam  (ATIVAN ) injection 2 mg  2 mg Intramuscular TID PRN Motley-Mangrum, Jadeka A, PMHNP       haloperidol  lactate (HALDOL ) injection 10 mg  10 mg  Intramuscular TID PRN Motley-Mangrum, Jadeka A, PMHNP       And   diphenhydrAMINE  (BENADRYL ) injection 50 mg  50 mg Intramuscular TID PRN Motley-Mangrum, Jadeka A, PMHNP       And   LORazepam  (ATIVAN ) injection 2 mg  2 mg Intramuscular TID PRN  Motley-Mangrum, Jadeka A, PMHNP       folic acid  (FOLVITE ) tablet 1 mg  1 mg Oral Daily Motley-Mangrum, Jadeka A, PMHNP   1 mg at 07/17/24 0834   hydrOXYzine  (ATARAX ) tablet 25 mg  25 mg Oral TID PRN Leigh Corean Massa, MD   25 mg at 07/17/24 0834   magnesium  hydroxide (MILK OF MAGNESIA) suspension 30 mL  30 mL Oral Daily PRN Motley-Mangrum, Jadeka A, PMHNP   30 mL at 07/16/24 0913   mirtazapine  (REMERON ) tablet 15 mg  15 mg Oral QHS Chole Driver, Corean Massa, MD   15 mg at 07/16/24 2105   multivitamin with minerals tablet 1 tablet  1 tablet Oral Daily Motley-Mangrum, Jadeka A, PMHNP   1 tablet at 07/17/24 0834   thiamine  (VITAMIN B1) tablet 100 mg  100 mg Oral Daily Motley-Mangrum, Jadeka A, PMHNP   100 mg at 07/17/24 9165   Or   thiamine  (VITAMIN B1) injection 100 mg  100 mg Intravenous Daily Motley-Mangrum, Jadeka A, PMHNP       Current Outpatient Medications  Medication Sig Dispense Refill   Multiple Vitamins-Minerals (B COMPLEX-C-E-ZINC ) tablet Take 1 tablet by mouth daily. 30 tablet 0   hydrOXYzine  (ATARAX ) 25 MG tablet Take 1 tablet (25 mg total) by mouth 3 (three) times daily as needed for anxiety. 30 tablet 0   mirtazapine  (REMERON ) 15 MG tablet Take 1 tablet (15 mg total) by mouth at bedtime. 30 tablet 0    PTA Medications:  Facility Ordered Medications  Medication   [COMPLETED] lactated ringers  bolus 1,000 mL   [COMPLETED] chlordiazePOXIDE  (LIBRIUM ) capsule 50 mg   [COMPLETED] LORazepam  (ATIVAN ) tablet 1 mg   [COMPLETED] potassium chloride  SA (KLOR-CON  M) CR tablet 40 mEq   folic acid  (FOLVITE ) tablet 1 mg   [EXPIRED] LORazepam  (ATIVAN ) tablet 1-4 mg   Or   [EXPIRED] LORazepam  (ATIVAN ) injection 1-4 mg   multivitamin with minerals tablet 1 tablet   thiamine  (VITAMIN B1) tablet 100 mg   Or   thiamine  (VITAMIN B1) injection 100 mg   alum & mag hydroxide-simeth (MAALOX/MYLANTA) 200-200-20 MG/5ML suspension 30 mL   magnesium  hydroxide (MILK OF MAGNESIA) suspension 30 mL    haloperidol  (HALDOL ) tablet 5 mg   And   diphenhydrAMINE  (BENADRYL ) capsule 50 mg   haloperidol  lactate (HALDOL ) injection 5 mg   And   diphenhydrAMINE  (BENADRYL ) injection 50 mg   And   LORazepam  (ATIVAN ) injection 2 mg   haloperidol  lactate (HALDOL ) injection 10 mg   And   diphenhydrAMINE  (BENADRYL ) injection 50 mg   And   LORazepam  (ATIVAN ) injection 2 mg   acetaminophen  (TYLENOL ) tablet 650 mg   [COMPLETED] LORazepam  (ATIVAN ) tablet 1 mg   Followed by   [COMPLETED] LORazepam  (ATIVAN ) tablet 0.5 mg   Followed by   [COMPLETED] LORazepam  (ATIVAN ) tablet 0.5 mg   Followed by   [COMPLETED] LORazepam  (ATIVAN ) tablet 0.5 mg   hydrOXYzine  (ATARAX ) tablet 25 mg   mirtazapine  (REMERON ) tablet 15 mg   PTA Medications  Medication Sig   Multiple Vitamins-Minerals (B COMPLEX-C-E-ZINC ) tablet Take 1 tablet by mouth daily.   hydrOXYzine  (ATARAX ) 25 MG tablet Take 1 tablet (25 mg total) by mouth 3 (  three) times daily as needed for anxiety.   mirtazapine  (REMERON ) 15 MG tablet Take 1 tablet (15 mg total) by mouth at bedtime.       07/10/2024    6:02 PM 05/20/2024   11:21 AM 05/16/2024    1:58 PM  Depression screen PHQ 2/9  Decreased Interest 1 0 1  Down, Depressed, Hopeless 1 0 1  PHQ - 2 Score 2 0 2  Altered sleeping 1  1  Tired, decreased energy 1  1  Change in appetite 0  1  Feeling bad or failure about yourself  1  1  Trouble concentrating 1  1  Moving slowly or fidgety/restless 1  1  Suicidal thoughts 0  1  PHQ-9 Score 7  9  Difficult doing work/chores Very difficult  Somewhat difficult    Flowsheet Row ED from 07/10/2024 in [facility] Most recent reading at 07/10/2024  4:50 PM ED from 07/10/2024 in [facility] Emergency Department at [facility] Most recent reading at 07/10/2024  3:07 AM ED from 05/13/2024 in [facility] Most recent reading at 05/13/2024  4:39 PM  C-SSRS RISK CATEGORY No Risk Low Risk No Risk     Musculoskeletal  Strength & Muscle Tone: within normal limits Gait & Station: normal Patient leans: N/A  Psychiatric Specialty Exam  Presentation  General Appearance:  Casual; Appropriate for Environment  Eye Contact: Good  Speech: Normal Rate  Speech Volume: Normal  Handedness: Right   Mood and Affect  Mood: Euthymic  Affect: Congruent   Thought Process  Thought Processes: Linear  Descriptions of Associations:Intact  Orientation:Full (Time, Place and Person)  Thought Content:Logical  Diagnosis of Schizophrenia or Schizoaffective disorder in past: No    Hallucinations:Hallucinations: None  Ideas of Reference:None  Suicidal Thoughts:Suicidal Thoughts: No  Homicidal Thoughts:Homicidal Thoughts: No   Sensorium  Memory: Immediate Good; Recent Good; Remote Good  Judgment: Good  Insight: Good   Executive Functions  Concentration: Good  Attention Span: Good  Recall: Good  Fund of Knowledge: Good  Language: Good   Psychomotor Activity  Psychomotor Activity: Psychomotor Activity: Normal   Assets  Assets: Communication Skills; Desire for Improvement; Leisure Time; Physical Health; Social Support   Sleep  Sleep: Sleep: Good  Estimated Sleeping Duration (Last 24 Hours): 7.50-9.00 hours  No data recorded  Physical Exam  Physical Exam ROS Blood pressure 122/83, pulse 76, temperature 98.3 F (36.8 C), temperature source Oral, resp. rate 16, SpO2 97%. There is no height or weight on file to calculate BMI.  Demographic Factors:  Male, Divorced or widowed, and Caucasian  Loss Factors: NA  Historical Factors: Family history of mental illness or substance abuse  Risk Reduction Factors:   Sense of responsibility to family, Living with another person, especially a relative, Positive social support, and Positive coping skills or problem solving skills  Continued Clinical Symptoms:  Alcohol/Substance  Abuse/Dependencies  Cognitive Features That Contribute To Risk:  None    Suicide Risk:  Minimal: No identifiable suicidal ideation.  Patients presenting with no risk factors but with morbid ruminations; may be classified as minimal risk based on the severity of the depressive symptoms  Plan Of Care/Follow-up recommendations:  Activity:  as tolerated Diet:  low sodium, low fat  Disposition: discharge to family to transport to residential rehab  Corean Anette Potters, MD 07/17/2024, 3:24 PM

## 2024-07-17 NOTE — ED Notes (Signed)
 Pt asleep at this hour. No apparent distress. RR even and unlabored. Monitored for safety.

## 2024-07-17 NOTE — Group Note (Signed)
 Group Topic: Recovery Basics  Group Date: 07/17/2024 Start Time: 0100 End Time: 0200 Facilitators: Nicholaus Arlyne BIRCH, NT  Department: [facility]  Number of Participants: 7  Group Focus: coping skills Treatment Modality:  Psychoeducation Interventions utilized were problem solving Purpose: increase insight  Name: Trevor [name] Date of Birth: 07-27-1981  MR: [id number]    Level of Participation: active Quality of Participation: cooperative Interactions with others: helpful Mood/Affect: appropriate Triggers (if applicable): N/A Cognition: insightful Progress: Gaining insight Response: congruent Plan: patient will be encouraged to become active with recovery  Patients Problems:  Patient Active Problem List   Diagnosis Date Noted   MDD (major depressive disorder) 07/10/2024   Moderate episode of recurrent major depressive disorder (HCC) 01/30/2024   GAD (generalized anxiety disorder) 01/30/2024   Transaminitis 06/18/2022   GERD (gastroesophageal reflux disease) 06/18/2022   Hypokalemia 06/18/2022   Chronic hepatitis C without hepatic coma (HCC) 06/28/2019   Psoriasis 06/07/2019   H/O withdrawal symptoms, alcohol, with delirium and seizures (HCC) 06/07/2019   Alcohol use disorder, severe, dependence (HCC) 06/03/2019   Obesity (BMI 30-39.9) 01/09/2016   HSV-2 (herpes simplex virus 2) infection

## 2024-07-17 NOTE — ED Notes (Signed)
 Patient sleeping, no CIWA assessment at this time.

## 2024-07-18 DIAGNOSIS — R45851 Suicidal ideations: Secondary | ICD-10-CM | POA: Diagnosis not present

## 2024-07-18 DIAGNOSIS — F1029 Alcohol dependence with unspecified alcohol-induced disorder: Secondary | ICD-10-CM | POA: Diagnosis not present

## 2024-07-18 DIAGNOSIS — F1090 Alcohol use, unspecified, uncomplicated: Secondary | ICD-10-CM

## 2024-07-18 DIAGNOSIS — F419 Anxiety disorder, unspecified: Secondary | ICD-10-CM | POA: Diagnosis not present

## 2024-07-18 DIAGNOSIS — F332 Major depressive disorder, recurrent severe without psychotic features: Secondary | ICD-10-CM | POA: Diagnosis not present

## 2024-07-18 NOTE — ED Notes (Signed)
 Pt denied si hi and avh. Pt denied physical pain and discomforts. RN and pt discussed discharge paperwork including follow up care and prescriptions, pt expressed understanding and denied further questions. Pt was escorted off unit, questions denied.

## 2024-07-18 NOTE — ED Notes (Signed)
 Pt asleep at this hour. No apparent distress. RR even and unlabored. Monitored for safety.
